# Patient Record
Sex: Male | Born: 1965 | Race: White | Hispanic: No | Smoking: Current every day smoker
Health system: Southern US, Community
[De-identification: ages and names within clinical notes are randomized; demographics above are authoritative.]

## PROBLEM LIST (undated history)

## (undated) DIAGNOSIS — J189 Pneumonia, unspecified organism: Secondary | ICD-10-CM

## (undated) DIAGNOSIS — I739 Peripheral vascular disease, unspecified: Secondary | ICD-10-CM

## (undated) DIAGNOSIS — K5900 Constipation, unspecified: Secondary | ICD-10-CM

## (undated) DIAGNOSIS — D649 Anemia, unspecified: Secondary | ICD-10-CM

## (undated) DIAGNOSIS — C801 Malignant (primary) neoplasm, unspecified: Secondary | ICD-10-CM

## (undated) DIAGNOSIS — F1010 Alcohol abuse, uncomplicated: Secondary | ICD-10-CM

## (undated) DIAGNOSIS — I10 Essential (primary) hypertension: Secondary | ICD-10-CM

## (undated) HISTORY — PX: TONSILLECTOMY: SUR1361

## (undated) HISTORY — PX: FRACTURE SURGERY: SHX138

---

## 1997-12-25 ENCOUNTER — Other Ambulatory Visit: Admission: RE | Admit: 1997-12-25 | Discharge: 1997-12-25 | Payer: Self-pay | Admitting: Urology

## 2000-03-29 ENCOUNTER — Inpatient Hospital Stay (HOSPITAL_COMMUNITY): Admission: EM | Admit: 2000-03-29 | Discharge: 2000-03-30 | Payer: Self-pay | Admitting: Emergency Medicine

## 2002-12-07 ENCOUNTER — Emergency Department (HOSPITAL_COMMUNITY): Admission: AC | Admit: 2002-12-07 | Discharge: 2002-12-07 | Payer: Self-pay

## 2002-12-07 ENCOUNTER — Encounter: Payer: Self-pay | Admitting: Emergency Medicine

## 2005-07-18 ENCOUNTER — Emergency Department (HOSPITAL_COMMUNITY): Admission: EM | Admit: 2005-07-18 | Discharge: 2005-07-18 | Payer: Self-pay | Admitting: Emergency Medicine

## 2008-05-12 ENCOUNTER — Emergency Department (HOSPITAL_COMMUNITY): Admission: EM | Admit: 2008-05-12 | Discharge: 2008-05-12 | Payer: Self-pay | Admitting: Emergency Medicine

## 2010-07-22 ENCOUNTER — Emergency Department (HOSPITAL_COMMUNITY): Admission: EM | Admit: 2010-07-22 | Discharge: 2010-07-22 | Payer: Self-pay | Admitting: Emergency Medicine

## 2010-11-15 LAB — COMPREHENSIVE METABOLIC PANEL WITH GFR
ALT: 158 U/L — ABNORMAL HIGH (ref 0–53)
AST: 139 U/L — ABNORMAL HIGH (ref 0–37)
Calcium: 8.8 mg/dL (ref 8.4–10.5)
Creatinine, Ser: 0.91 mg/dL (ref 0.4–1.5)
GFR calc Af Amer: >60 mL/min (ref >60)
GFR calc non Af Amer: >60 mL/min (ref >60)
Glucose, Bld: 81 mg/dL (ref 70–99)
Sodium: 135 meq/L (ref 135–145)
Total Protein: 7.1 g/dL (ref 6.0–8.3)

## 2010-11-15 LAB — RAPID URINE DRUG SCREEN, HOSP PERFORMED
Amphetamines: NOT DETECTED
Barbiturates: NOT DETECTED
Benzodiazepines: POSITIVE — AB
Cocaine: NOT DETECTED
Opiates: NOT DETECTED
Tetrahydrocannabinol: NOT DETECTED

## 2010-11-15 LAB — DIFFERENTIAL
Basophils Absolute: 0 10*3/uL (ref 0.0–0.1)
Basophils Relative: 0 % (ref 0–1)
Eosinophils Absolute: 0.1 K/uL (ref 0.0–0.7)
Eosinophils Relative: 1 % (ref 0–5)
Lymphocytes Relative: 25 % (ref 12–46)
Lymphs Abs: 2 K/uL (ref 0.7–4.0)
Monocytes Absolute: 0.6 10*3/uL (ref 0.1–1.0)
Monocytes Relative: 8 % (ref 3–12)
Neutro Abs: 5.2 10*3/uL (ref 1.7–7.7)
Neutrophils Relative %: 66 % (ref 43–77)

## 2010-11-15 LAB — COMPREHENSIVE METABOLIC PANEL
Albumin: 4.4 g/dL (ref 3.5–5.2)
Alkaline Phosphatase: 66 U/L (ref 39–117)
BUN: 5 mg/dL — ABNORMAL LOW (ref 6–23)
CO2: 25 mEq/L (ref 19–32)
Chloride: 99 mEq/L (ref 96–112)
Potassium: 4.7 mEq/L (ref 3.5–5.1)
Total Bilirubin: 1.3 mg/dL — ABNORMAL HIGH (ref 0.3–1.2)

## 2010-11-15 LAB — CBC
HCT: 48.6 % (ref 39.0–52.0)
Hemoglobin: 17.3 g/dL — ABNORMAL HIGH (ref 13.0–17.0)
MCH: 35.7 pg — ABNORMAL HIGH (ref 26.0–34.0)
MCHC: 35.6 g/dL (ref 30.0–36.0)
MCV: 100.4 fL — ABNORMAL HIGH (ref 78.0–100.0)
Platelets: 163 K/uL (ref 150–400)
RBC: 4.84 MIL/uL (ref 4.22–5.81)
RDW: 12.8 % (ref 11.5–15.5)
WBC: 7.9 10*3/uL (ref 4.0–10.5)

## 2010-11-15 LAB — ETHANOL: Alcohol, Ethyl (B): 145 mg/dL — ABNORMAL HIGH (ref 0–10)

## 2011-01-20 NOTE — Discharge Summary (Signed)
[place]. [facility]  Patient:    Arthur Mcmahon, Arthur Mcmahon                       MRN: [id number] Adm. Date:  [id number] Disc. Date: [id number] Attending:  Berry, Jonathan [place] Dictator:   Marya Fossa, P.A. CC:         Runell Gess, M.D.             Quitman Livings, M.D., [facility]                           Discharge Summary  DATE OF BIRTH:  Nov 22, 1965  ADMISSION DIAGNOSES: 1. Chest pain, rule out myocardial infarction. 2. Hypertension. 3. Ongoing heavy tobacco abuse. 4. Family history of coronary artery disease.  DISCHARGE DIAGNOSES: 1. Chest pain--noncardiac, normal coronaries per catheterization. 2. Tobacco abuse--counseled on cessation.  HISTORY OF PRESENT ILLNESS:  Mr. Arthur Mcmahon is a very pleasant 45 year old married white male, new patient to Dr. Nanetta Batty, referred by Dr. Rosalia Hammers at [facility] ER for evaluation of chest pain.  He has multiple cardiac risk factors including hypertension, tobacco abuse, family history, and unknown lipid status.  On his way to work he felt weak and short of breath.  When he got to work he walked up two flights of stairs and became increasing short of breath, fatigued with his heart racing and chest pressure.  It lasted approximately 10-15 minutes.  He had some nausea but no radiation symptoms.  He then went home and still felt poorly.  His wife brought him to the emergency room in [place].  He was given aspirin and IV heparin.  He had no chest pressure at [facility] and was transferred to [facility] for further evaluation.  ECG is unremarkable.  The patient has had a few episodes of weakness and shortness of breath over the last couple of months when his blood pressure was elevated.  He denies MI or coronary artery disease.  The patient will be admitted for chest pain, rule out MI with a planned a cardiac catheterization.  PROCEDURE:  Cardiac catheterization March 29, 2000, by Dr. Nanetta Batty.  COMPLICATIONS:   None.  CONSULTATIONS:  None.  HOSPITAL COURSE:  The patient was continued on IV heparin per pharmacy protocol.  Cardiac enzymes were negative.  Laboratory studies were unremarkable.  Chest x-ray unremarkable.  The patient was taken to the cardiac catheterization by Dr. Nanetta Batty, March 29, 2000.  This revealed normal coronary arteries and normal LV function. Dr. Allyson Sabal felt that he probably had anxiety or reflux symptoms but noncardiac chest pain.  The patient remained stable postcatheterization and was deemed stable for discharge to home by Dr. Allyson Sabal on March 30, 2000.  He will be seen back in our [place] office on August 10 at 10:40 by Lezlie Octave.  He should followup then with his primary care Shiah Berhow for smoking cessation and continued blood pressure control.  DISCHARGE MEDICATIONS: 1. Micardis as before. 2. Wellbutrin SR 150 mg one a day for 5 days, then b.i.d. 3. Protonix 40 mg a day.  Perclose was used during the procedure and the patient has been instructed to do no swimming or soaking in the tub for a week.  He may shower but to keep the groin dry.  He is to refrain from heavy lifting, sexual activity, driving for the next three days and may return to work without restriction on Monday.  DD:  03/30/00 TD:  04/02/00 Job: [id number]

## 2011-01-20 NOTE — Cardiovascular Report (Signed)
[place]. [facility]  Patient:    Arthur Mcmahon, Arthur Mcmahon                         MRN: [id number] Attending:  Runell Gess, M.D. CC:         Cardiac Catheterization Lab             [facility], 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  His cardiac risk factors include hypertension and tobacco use as well as a family history of heart disease.  He has had several episodes of vague chest pressure this week culminating in a more significant episode today associated with nausea and some shortness of breath.  His EKGs showed no acute changes.  His enzymes were negative.  He was seen in the [facility] ER by Dr. Hipolito Bayley and transferred to [facility] by [facility] where he was pain-free on arrival after receiving aspirin and IV heparin.  He presents now for diagnostic coronary arteriography.  DESCRIPTION OF PROCEDURE:  The patient was brought to the second floor [facility] Cardiac Catheterization Lab in the postabsorptive state.  He was premedicated with p.o. Valium.  His right groin was prepped and shaved in the usual sterile fashion.  Xylocaine, 1%, was used for local anesthesia.  A 6-French sheath was inserted into the right femoral artery using standard Seldinger technique.  The 6-French right and left Judkins diagnostic catheters, along with a 6-French pigtail catheter, were used for selective coronary angiography, left ventriculography, and distal abdominal aortography. Omnipaque dye was used for the entirety of the case. Retrograde aortic, left ventricular, and pullback pressures were recorded.  HEMODYNAMICS: 1. Aortic systolic pressure 112. 2. Diastolic pressure 70. 3. Left ventricular systolic pressure 113. 4. End diastolic pressure 13.  SELECTIVE CORONARY ANGIOGRAPHY: 1. Left main:  Normal.  At the  end of the left shots in the LAO cranial view,    the left main did appear to be stenosed; however, this was not visible in    any other view, and I suspect that this was catheter spasm-induced. 2. LAD:  Widely patent. 3. Ramus intermedius branch:  Widely patent. 4. Left circumflex:  Widely patent. 5. Right coronary artery:  This was a dominant vessel and was widely patent.  LEFT VENTRICULOGRAPHY:  RAO left ventriculogram was performed using 25 cc of Omnipaque dye at 12 cc per second.  The overall LVEF was estimated greater than 60% without focal wall motion abnormalities.  DISTAL ABDOMINAL AORTOGRAPHY:  Distal abdominal aortogram was performed using 30 cc of Omnipaque dye at 20 cc per second.  The renal arteries were widely patent.  The infrarenal abdominal aorta and the iliac bifurcation appear free of significant atherosclerotic changes.  IMPRESSION:  Arthur Mcmahon has essentially normal coronary arteries, normal LV function, and normal renal arteries, suggesting his hypertension is essential in nature.  His right femoral arterial puncture site was Perclosed, and hemostasis was obtained.  The patient left the lab in stable condition.  He will be observed overnight and discharged home in the morning.  Dr. Hipolito Bayley was notified of these results.  The patient left the lab in stable condition. DD:  03/29/00 TD:  03/30/00 Job: [id number]  [id number]

## 2011-12-25 ENCOUNTER — Emergency Department (HOSPITAL_COMMUNITY)
Admission: EM | Admit: 2011-12-25 | Discharge: 2011-12-25 | Disposition: A | Discharge status: Home / Self Care | Payer: Self-pay | Attending: Emergency Medicine | Admitting: Emergency Medicine

## 2011-12-25 ENCOUNTER — Encounter (HOSPITAL_COMMUNITY): Payer: Self-pay

## 2011-12-25 DIAGNOSIS — K029 Dental caries, unspecified: Secondary | ICD-10-CM | POA: Insufficient documentation

## 2011-12-25 DIAGNOSIS — I10 Essential (primary) hypertension: Secondary | ICD-10-CM | POA: Insufficient documentation

## 2011-12-25 DIAGNOSIS — K0889 Other specified disorders of teeth and supporting structures: Secondary | ICD-10-CM

## 2011-12-25 DIAGNOSIS — Z8249 Family history of ischemic heart disease and other diseases of the circulatory system: Secondary | ICD-10-CM | POA: Insufficient documentation

## 2011-12-25 DIAGNOSIS — F17200 Nicotine dependence, unspecified, uncomplicated: Secondary | ICD-10-CM | POA: Insufficient documentation

## 2011-12-25 DIAGNOSIS — Z809 Family history of malignant neoplasm, unspecified: Secondary | ICD-10-CM | POA: Insufficient documentation

## 2011-12-25 HISTORY — DX: Essential (primary) hypertension: I10

## 2011-12-25 MED ORDER — PENICILLIN V POTASSIUM 500 MG PO TABS
500.0000 mg | ORAL_TABLET | Freq: Once | ORAL | Status: AC
  Administered 2011-12-25: 500 mg via ORAL
  Filled 2011-12-25: qty 1

## 2011-12-25 MED ORDER — HYDROCODONE-ACETAMINOPHEN 5-325 MG PO TABS: 1.0000 | ORAL_TABLET | ORAL | Status: AC | PRN

## 2011-12-25 MED ORDER — PENICILLIN V POTASSIUM 500 MG PO TABS: 500.0000 mg | ORAL_TABLET | Freq: Three times a day (TID) | ORAL | Status: AC

## 2011-12-25 NOTE — ED Provider Notes (Signed)
History     CSN: [id number]  Arrival date & time 12/25/11  1315   First MD Initiated Contact with Patient 12/25/11 1523      Chief Complaint  Patient presents with  . Dental Pain    (Consider location/radiation/quality/duration/timing/severity/associated sxs/prior treatment) Patient is a 46 y.o. male presenting with tooth pain. The history is provided by the patient.  Dental PainThe primary symptoms include mouth pain and dental injury. Primary symptoms do not include fever or sore throat. The symptoms occur constantly.  Additional symptoms include: facial swelling. Associated symptoms comments: He broke a lower tooth several weeks ago and is now having progressively worsening pain. Last night was worst pain and also onset of facial swelling. No fever. .    Past Medical History  Diagnosis Date  . Hypertension     Past Surgical History  Procedure Date  . Tonsillectomy     Family History  Problem Relation Age of Onset  . Cancer Mother   . Heart failure Mother   . Cancer Father     History  Substance Use Topics  . Smoking status: Current Everyday Smoker -- 1.0 packs/day for 25 years    Types: Cigarettes  . Smokeless tobacco: Never Used  . Alcohol Use: Yes     occasionally      Review of Systems  Constitutional: Negative.  Negative for fever.  HENT: Positive for facial swelling and dental problem. Negative for sore throat and mouth sores.   Gastrointestinal: Negative.  Negative for nausea.    Allergies  Review of patient's allergies indicates no known allergies.  Home Medications   Current Outpatient Rx  Name Route Sig Dispense Refill  . ACETAMINOPHEN 325 MG PO TABS Oral Take 650 mg by mouth every 6 (six) hours as needed. Pain.    Marland Kitchen PRESCRIPTION MEDICATION Oral Take 1 tablet by mouth daily.      BP 147/87  Pulse 78  Temp(Src) 98.2 F (36.8 C) (Oral)  Resp 18  Ht 6\' 2"  (1.88 m)  Wt 230 lb (104.327 kg)  BMI 29.53 kg/m2  SpO2 98%  Physical Exam    Constitutional: He appears well-developed and well-nourished.  HENT:  Head: Normocephalic.  Mouth/Throat: Oropharynx is clear and moist.       Severe decay to #27, 28 without redness and swelling to alveolar ridge surfaces. No pointing abscess.   Lymphadenopathy:    He has no cervical adenopathy.  Neurological: He is alert.    ED Course  Procedures (including critical care time)  Labs Reviewed - No data to display No results found.   No diagnosis found. 1. Dental caries 2. Dental pain   MDM  Will cover for infection given increased pain in the presence of severe decay. Patient report pain usually controlled with Alleve but is becoming less effective. Will give pain relief and encourage dental follow up.       Rodena Medin, PA-C 12/25/11 1556

## 2011-12-25 NOTE — Discharge Instructions (Signed)
FOLLOW UP WITH A DENTIST OF YOUR CHOICE FOR FURTHER MANAGEMENT OF DENTAL DECAY. TAKE MEDICATIONS AS PRESCRIBED, TAKING NORCO AT NIGHT WHEN NOT WORKING OR DRIVING AS IT CAUSES DROWSINESS. RETURN HERE AS NEEDED.  Dental Caries  Tooth decay (dental caries, cavities) is the most common of all oral diseases. It occurs in all ages but is more common in children and young adults.  CAUSES  Bacteria in your mouth combine with foods (particularly sugars and starches) to produce plaque. Plaque is a substance that sticks to the hard surfaces of teeth. The bacteria in the plaque produce acids that attack the enamel of teeth. Repeated acid attacks dissolve the enamel and create holes in the teeth. Root surfaces of teeth may also get these holes.  Other contributing factors include:   Frequent snacking and drinking of cavity-producing foods and liquids.   Poor oral hygiene.   Dry mouth.   Substance abuse such as methamphetamine.   Broken or poor fitting dental restorations.   Eating disorders.   Gastroesophageal reflux disease (GERD).   Certain radiation treatments to the head and neck.  SYMPTOMS  At first, dental decay appears as white, chalky areas on the enamel. In this early stage, symptoms are seldom present. As the decay progresses, pits and holes may appear on the enamel surfaces. Progression of the decay will lead to softening of the hard layers of the tooth. At this point you may experience some pain or achy feeling after sweet, hot, or cold foods or drinks are consumed. If left untreated, the decay will reach the internal structures of the tooth and produce severe pain. Extensive dental treatment, such as root canal therapy, may be needed to save the tooth at this late stage of decay development.  DIAGNOSIS  Most cavities will be detected during regular check-ups. A thorough medical and dental history will be taken by the dentist. The dentist will use instruments to check the surfaces of your  teeth for any breakdown or discoloration. Some dentists have special instruments, such as lasers, that detect tooth decay. Dental X-rays may also show some cavities that are not visible to the eye (such as between the contact areas of the teeth). TREATMENT  Treatment involves removal of the tooth decay and replacement with a restorative material such as silver, gold, or composite (white) material. However, if the decay involves a large area of the tooth and there is little remaining healthy tooth structure, a cap (crown) will be fitted over the remaining structure. If the decay involves the center part of the tooth (pulp), root canal treatment will be needed before any type of dental restoration is placed. If the tooth is severely destroyed by the decay process, leaving the remaining tooth structures unrestorable, the tooth will need to be pulled (extracted). Some early tooth decay may be reversed by fluoride treatments and thorough brushing and flossing at home. PREVENTION   Eat healthy foods. Restrict the amount of sugary, starchy foods and liquids you consume. Avoid frequent snacking and drinking of unhealthy foods and liquids.   Sealants can help with prevention of cavities. Sealants are composite resins applied onto the biting surfaces of teeth at risk for decay. They smooth out the pits and grooves and prevent food from being trapped in them. This is done in early childhood before tooth decay has started.   Fluoride tablets may also be prescribed to children between 6 months and 74 years of age if your drinking water is not fluoridated. The fluoride absorbed by  the tooth enamel makes teeth less susceptible to decay. Thorough daily cleaning with a toothbrush and dental floss is the best way to prevent cavities. Use of a fluoride toothpaste is highly recommended. Fluoride mouth rinses may be used in specific cases.   Topical application of fluoride by your dentist is important in children.    Regular visits with a dentist for checkups and cleanings are also important.  SEEK IMMEDIATE DENTAL CARE IF:  You have a fever.   You develop redness and swelling of your face, jaw, or neck.   You develop swelling around a tooth.   You are unable to open your mouth or cannot swallow.   You have severe pain uncontrolled by pain medicine.  Document Released: 05/13/2002 Document Revised: 08/10/2011 Document Reviewed: 01/26/2011 [facility] Patient Information 2012 [place], [place].

## 2011-12-25 NOTE — ED Notes (Signed)
Patient reports that he began having throbbing and swelling of right face. Patient reports that he broke a tooth 3-4 weeks ago and now has a Right lower tooth abscess.

## 2011-12-27 NOTE — ED Provider Notes (Signed)
History/physical exam/procedure(s) were performed by non-physician practitioner and as supervising physician I was immediately available for consultation/collaboration. I have reviewed all notes and am in agreement with care and plan.   Hilario Quarry, MD 12/27/11 [phone]

## 2013-02-05 ENCOUNTER — Encounter (HOSPITAL_COMMUNITY): Payer: Self-pay | Admitting: Emergency Medicine

## 2013-02-05 ENCOUNTER — Emergency Department (HOSPITAL_COMMUNITY)
Admission: EM | Admit: 2013-02-05 | Discharge: 2013-02-06 | Disposition: A | Discharge status: Home / Self Care | Payer: Self-pay | Attending: Emergency Medicine | Admitting: Emergency Medicine

## 2013-02-05 DIAGNOSIS — Z79899 Other long term (current) drug therapy: Secondary | ICD-10-CM | POA: Insufficient documentation

## 2013-02-05 DIAGNOSIS — F1010 Alcohol abuse, uncomplicated: Secondary | ICD-10-CM | POA: Insufficient documentation

## 2013-02-05 DIAGNOSIS — I10 Essential (primary) hypertension: Secondary | ICD-10-CM | POA: Insufficient documentation

## 2013-02-05 LAB — COMPREHENSIVE METABOLIC PANEL
ALT: 122 U/L — ABNORMAL HIGH (ref 0–53)
AST: 173 U/L — ABNORMAL HIGH (ref 0–37)
Albumin: 4.4 g/dL (ref 3.5–5.2)
Alkaline Phosphatase: 84 U/L (ref 39–117)
Potassium: 3.5 mEq/L (ref 3.5–5.1)
Sodium: 138 mEq/L (ref 135–145)
Total Protein: 7.8 g/dL (ref 6.0–8.3)

## 2013-02-05 LAB — RAPID URINE DRUG SCREEN, HOSP PERFORMED
Amphetamines: NOT DETECTED
Cocaine: NOT DETECTED
Opiates: NOT DETECTED
Tetrahydrocannabinol: POSITIVE — AB

## 2013-02-05 LAB — CBC
Hemoglobin: 17.8 g/dL — ABNORMAL HIGH (ref 13.0–17.0)
MCHC: 36.8 g/dL — ABNORMAL HIGH (ref 30.0–36.0)
RDW: 13.2 % (ref 11.5–15.5)

## 2013-02-05 MED ORDER — LORAZEPAM 1 MG PO TABS
1.0000 mg | ORAL_TABLET | Freq: Three times a day (TID) | ORAL | Status: DC | PRN
  Administered 2013-02-05: 1 mg via ORAL
  Filled 2013-02-05: qty 1

## 2013-02-05 MED ORDER — NICOTINE 21 MG/24HR TD PT24
21.0000 mg | MEDICATED_PATCH | Freq: Every day | TRANSDERMAL | Status: DC
  Administered 2013-02-05: 21 mg via TRANSDERMAL
  Filled 2013-02-05: qty 1

## 2013-02-05 MED ORDER — IBUPROFEN 400 MG PO TABS: 600.0000 mg | ORAL_TABLET | Freq: Three times a day (TID) | ORAL | Status: DC | PRN

## 2013-02-05 MED ORDER — ONDANSETRON HCL 4 MG PO TABS: 4.0000 mg | ORAL_TABLET | Freq: Three times a day (TID) | ORAL | Status: DC | PRN

## 2013-02-05 MED ORDER — ZOLPIDEM TARTRATE 5 MG PO TABS
5.0000 mg | ORAL_TABLET | Freq: Every evening | ORAL | Status: DC | PRN
  Administered 2013-02-06: 5 mg via ORAL
  Filled 2013-02-05: qty 1

## 2013-02-05 MED ORDER — ALUM & MAG HYDROXIDE-SIMETH 200-200-20 MG/5ML PO SUSP: 30.0000 mL | ORAL | Status: DC | PRN

## 2013-02-05 MED ORDER — ACETAMINOPHEN 325 MG PO TABS: 650.0000 mg | ORAL_TABLET | ORAL | Status: DC | PRN

## 2013-02-05 NOTE — ED Notes (Signed)
Patient requested and received a ginger ale.

## 2013-02-05 NOTE — ED Notes (Signed)
Pt wanded by security. Wallet given to security and locked

## 2013-02-05 NOTE — ED Notes (Signed)
Pt requesting detox from ETOH.  Admits to one fifth everyday.  St's last drank earlier today. Pt denies SI or HI

## 2013-02-05 NOTE — ED Notes (Signed)
ACT team at bedside.  

## 2013-02-06 MED ORDER — LORAZEPAM 2 MG/ML IJ SOLN: 1.0000 mg | Freq: Four times a day (QID) | INTRAMUSCULAR | Status: DC | PRN

## 2013-02-06 MED ORDER — THIAMINE HCL 100 MG/ML IJ SOLN: 100.0000 mg | Freq: Every day | INTRAMUSCULAR | Status: DC

## 2013-02-06 MED ORDER — LORAZEPAM 1 MG PO TABS: 1.0000 mg | ORAL_TABLET | Freq: Four times a day (QID) | ORAL | Status: DC | PRN

## 2013-02-06 MED ORDER — LORAZEPAM 1 MG PO TABS: 0.0000 mg | ORAL_TABLET | Freq: Two times a day (BID) | ORAL | Status: DC

## 2013-02-06 MED ORDER — ADULT MULTIVITAMIN W/MINERALS CH: 1.0000 | ORAL_TABLET | Freq: Every day | ORAL | Status: DC

## 2013-02-06 MED ORDER — FOLIC ACID 1 MG PO TABS: 1.0000 mg | ORAL_TABLET | Freq: Every day | ORAL | Status: DC

## 2013-02-06 MED ORDER — LORAZEPAM 1 MG PO TABS: 0.0000 mg | ORAL_TABLET | Freq: Four times a day (QID) | ORAL | Status: DC

## 2013-02-06 MED ORDER — VITAMIN B-1 100 MG PO TABS: 100.0000 mg | ORAL_TABLET | Freq: Every day | ORAL | Status: DC

## 2013-02-06 NOTE — ED Provider Notes (Signed)
Medical screening examination/treatment/procedure(s) were conducted as a shared visit with non-physician practitioner(s) and myself.  I personally evaluated the patient during the encounter  47yo M, long hx of etoh abuse, worse since Feb when he lost his job. States he has been drinking approx 1/5th liquor daily, unable to stop on his own due to "not feeling good" when he didn't drink etoh.  Presents to ED requesting etoh detox. Denies SI, no HI. Feels "a little shakey and restless now," will place on CIWA protocol. VSS, A&O, resps easy, RRR, neuro non-focal, calm/cooperative. Labs and UDS obtained; ACT to see.  Laray Anger, DO 02/06/13 [phone]

## 2013-02-06 NOTE — BH Assessment (Signed)
[facility] Assessment Progress Note      Update:  Spoke with Eber Jones at [facility] @ [phone] and she reported pt arrived to [facility].

## 2013-02-06 NOTE — ED Notes (Signed)
Per Berna Spare (with ACT), pt has been accepted at [facility] in [place], [place]. Patient will be discharged in the AM (sometime after breakfast). Patient will arrange his own transportation to the facility. Nursing is to phone report to facility at [phone] once patient is discharged and leaves. Patient must report to [facility] within 1 hour of leaving [facility].

## 2013-02-06 NOTE — ED Notes (Signed)
Patients belongings returned. Pt out to nurse's station working on getting a ride.

## 2013-02-06 NOTE — BH Assessment (Signed)
Assessment Note   Arthur Mcmahon is an 47 y.o. male.  Pt came to [facility] seeking detox from ETOH.  He is currently drinking a fifth of whiskey daily and has done so at that rate for the last 3 months.  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  Patient last drank around noon on 06/04 and had drank a pint at that time.  Patient smokes marijuana 1-2 times per month.  Patient denies any SI, HI or A/V hallucinations.  He reports depression over his current addiction, job loss and he has also had 4 family members die in the last 2 years.  Patient was at [facility] for detox three years ago.  Patient is being referred to [facility] for detox since they have some beds available.  Axis I: 303.90 ETOH dependence Axis II: Deferred Axis III:  Past Medical History  Diagnosis Date  . Hypertension    Axis IV: economic problems, occupational problems and problems with primary support group Axis V: 31-40 impairment in reality testing  Past Medical History:  Past Medical History  Diagnosis Date  . Hypertension     Past Surgical History  Procedure Laterality Date  . Tonsillectomy    . Fracture surgery      Family History:  Family History  Problem Relation Age of Onset  . Cancer Mother   . Heart failure Mother   . Cancer Father     Social History:  reports that he has been smoking Cigarettes.  He has a 25 pack-year smoking history. He has never used smokeless tobacco. He reports that  drinks alcohol. He reports that he uses illicit drugs (Marijuana).  Additional Social History:  Alcohol / Drug Use Pain Medications: N/A Prescriptions: None Over the Counter: N/A History of alcohol / drug use?: Yes Withdrawal Symptoms: Agitation;Cramps;Fever / Chills;Nausea / Vomiting;Tremors;Patient aware of relationship between substance abuse and physical/medical complications Substance #1 Name of Substance 1: ETOH 1 - Age of First Use: Teens 1 - Amount (size/oz): 1/5 per  day 1 - Frequency: Daily use 1 - Duration: 3 months 1 - Last Use / Amount: 06/04 around noon, drank about a pint Substance #2 Name of Substance 2: Marijuana 2 - Age of First Use: Teens 2 - Amount (size/oz): Varies, does not seek it out. 2 - Frequency: 1-2 x/M 2 - Duration: On-going 2 - Last Use / Amount: Cannot recall last use  CIWA: CIWA-Ar BP: 126/71 mmHg Pulse Rate: 88 Nausea and Vomiting: no nausea and no vomiting Tactile Disturbances: none Tremor: two Auditory Disturbances: not present Paroxysmal Sweats: no sweat visible Visual Disturbances: very mild sensitivity Anxiety: mildly anxious Headache, Fullness in Head: none present Agitation: normal activity Orientation and Clouding of Sensorium: cannot do serial additions or is uncertain about date CIWA-Ar Total: 5 COWS:    Allergies: No Known Allergies  Home Medications:  (Not in a hospital admission)  OB/GYN Status:  No LMP for male patient.  General Assessment Data Location of Assessment: [facility] ED Living Arrangements: Alone Can pt return to current living arrangement?: Yes Admission Status: Voluntary Is patient capable of signing voluntary admission?: Yes Transfer from: Acute Hospital Referral Source: Self/Family/Friend     Risk to self Suicidal Ideation: No Suicidal Intent: No Is patient at risk for suicide?: No Suicidal Plan?: No Access to Means: No What has been your use of drugs/alcohol within the last 12 months?: ETOH daily use Previous Attempts/Gestures: No How many times?: 0 Other  Self Harm Risks: SA issues Triggers for Past Attempts: None known Intentional Self Injurious Behavior: None Family Suicide History: No Recent stressful life event(s): Loss (Comment);Financial Problems;Job Loss (Lost 4 family members over last 2-3 years; job lost in March) Persecutory voices/beliefs?: No Depression: Yes Depression Symptoms: Despondent;Insomnia;Isolating;Loss of interest in usual pleasures;Feeling  worthless/self pity Substance abuse history and/or treatment for substance abuse?: Yes Suicide prevention information given to non-admitted patients: Not applicable  Risk to Others Homicidal Ideation: No Thoughts of Harm to Others: No Current Homicidal Intent: No Current Homicidal Plan: No Access to Homicidal Means: No Identified Victim: No one History of harm to others?: No Assessment of Violence: In distant past Violent Behavior Description: Pt calm and cooperative Does patient have access to weapons?: No Criminal Charges Pending?: No Does patient have a court date: No  Psychosis Hallucinations: None noted Delusions: None noted  Mental Status Report Appear/Hygiene: Disheveled Eye Contact: Good Motor Activity: Freedom of movement;Unremarkable Speech: Logical/coherent Level of Consciousness: Quiet/awake Mood: Depressed;Guilty Affect: Sad Anxiety Level: Minimal Thought Processes: Coherent;Relevant Judgement: Unimpaired Orientation: Person;Place;Situation Obsessive Compulsive Thoughts/Behaviors: Minimal  Cognitive Functioning Concentration: Decreased Memory: Recent Impaired;Remote Intact IQ: Average Insight: Fair Impulse Control: Poor Appetite: Poor Weight Loss: 15 Weight Gain: 0 Sleep: Decreased Total Hours of Sleep:  (<6H/D.  Will drink until he goes to sleep) Vegetative Symptoms: None  ADLScreening [facility] Assessment Services) Patient's cognitive ability adequate to safely complete daily activities?: Yes Patient able to express need for assistance with ADLs?: Yes Independently performs ADLs?: Yes (appropriate for developmental age)  Abuse/Neglect [facility]) Physical Abuse: Denies Verbal Abuse: Denies Sexual Abuse: Denies  Prior Inpatient Therapy Prior Inpatient Therapy: Yes Prior Therapy Dates: 3 years ago Prior Therapy Facilty/Provider(s): [facility] Reason for Treatment: Detox  Prior Outpatient Therapy Prior Outpatient Therapy: No Prior Therapy Dates:  None Prior Therapy Facilty/Provider(s): N/A Reason for Treatment: N/A  ADL Screening (condition at time of admission) Patient's cognitive ability adequate to safely complete daily activities?: Yes Patient able to express need for assistance with ADLs?: Yes Independently performs ADLs?: Yes (appropriate for developmental age) Weakness of Legs: None Weakness of Arms/Hands: None  Home Assistive Devices/Equipment Home Assistive Devices/Equipment: None    Abuse/Neglect Assessment (Assessment to be complete while patient is alone) Physical Abuse: Denies Verbal Abuse: Denies Sexual Abuse: Denies Exploitation of patient/patient's resources: Denies Self-Neglect: Denies     Merchant navy officer (For Healthcare) Advance Directive: Patient does not have advance directive;Patient would not like information    Additional Information 1:1 In Past 12 Months?: No CIRT Risk: No Elopement Risk: No Does patient have medical clearance?: Yes     Disposition:  Disposition Initial Assessment Completed for this Encounter: Yes Disposition of Patient: Inpatient treatment program;Referred to Type of inpatient treatment program: Adult Patient referred to: [facility]  On Site Evaluation by:   Reviewed with Physician:  Junious Silk, PA     Beatriz Stallion Ray 02/06/2013 1:59 AM

## 2013-02-06 NOTE — ED Notes (Signed)
Patient sleeping and resting comfortably.

## 2013-02-06 NOTE — BH Assessment (Signed)
[facility] Assessment Progress Note   Patient has been accepted to [facility] in [place].  Patient was accepted by Cory Munch there.  This clinician procured authorization from [employer] and sent that authorization number to [facility].  Patient has said that he can get transportation possibly from brother.  Patient is unsure of when he can be picked up once he makes those arrangements.  When patient does leave the staff need to know at [facility] b/c patient won't be admitted if it takes him more than an hour to get there.  Nursing staff will need to call [facility] at [phone] when patient leaves.  If patient cannot make transportation arrangements then social work should be contacted.

## 2013-02-06 NOTE — ED Notes (Signed)
Pt belongings inventory done and placed in the locker.

## 2013-02-06 NOTE — ED Notes (Signed)
[facility] contacted and informed of pt's discharged.

## 2013-02-06 NOTE — ED Notes (Signed)
Discussed discharge plan with patient. Patient states that his brother will be able to transport him to [facility]. Patient will notify staff if he is unable to arrange transportation within a reasonable time frame this AM. Informed patient that he must arrive to [facility] within 1 hour of leaving [facility] or he will not be admitted to the facility. Patient agreeable with plan of care and verbalizes understanding.

## 2013-02-06 NOTE — ED Notes (Signed)
Pt's brother contacted, on the way to get pt. Pt given diet coke, no further needs.

## 2013-02-06 NOTE — ED Provider Notes (Signed)
Pt accepted at [facility] for detox program. 0705  Hurman Horn, MD 02/06/13 [phone]

## 2013-08-05 ENCOUNTER — Emergency Department (HOSPITAL_COMMUNITY)
Admission: EM | Admit: 2013-08-05 | Discharge: 2013-08-06 | Disposition: A | Discharge status: Home / Self Care | Payer: Self-pay | Attending: Emergency Medicine | Admitting: Emergency Medicine

## 2013-08-05 ENCOUNTER — Encounter (HOSPITAL_COMMUNITY): Payer: Self-pay | Admitting: Emergency Medicine

## 2013-08-05 DIAGNOSIS — F17200 Nicotine dependence, unspecified, uncomplicated: Secondary | ICD-10-CM | POA: Insufficient documentation

## 2013-08-05 DIAGNOSIS — F10229 Alcohol dependence with intoxication, unspecified: Secondary | ICD-10-CM | POA: Insufficient documentation

## 2013-08-05 DIAGNOSIS — F1020 Alcohol dependence, uncomplicated: Secondary | ICD-10-CM

## 2013-08-05 DIAGNOSIS — R110 Nausea: Secondary | ICD-10-CM | POA: Insufficient documentation

## 2013-08-05 DIAGNOSIS — I10 Essential (primary) hypertension: Secondary | ICD-10-CM | POA: Insufficient documentation

## 2013-08-05 LAB — CBC
MCH: 36.3 pg — ABNORMAL HIGH (ref 26.0–34.0)
MCHC: 36.4 g/dL — ABNORMAL HIGH (ref 30.0–36.0)
MCV: 99.8 fL (ref 78.0–100.0)
Platelets: 201 10*3/uL (ref 150–400)

## 2013-08-05 MED ORDER — LORAZEPAM 1 MG PO TABS
0.0000 mg | ORAL_TABLET | Freq: Four times a day (QID) | ORAL | Status: DC
  Administered 2013-08-06: 1 mg via ORAL
  Filled 2013-08-05: qty 1

## 2013-08-05 MED ORDER — LORAZEPAM 1 MG PO TABS: 0.0000 mg | ORAL_TABLET | Freq: Two times a day (BID) | ORAL | Status: DC

## 2013-08-05 NOTE — ED Provider Notes (Signed)
CSN: [id number]     Arrival date & time 08/05/13  2034 History   First MD Initiated Contact with Patient 08/05/13 2306     Chief Complaint  Patient presents with  . Medical Clearance   (Consider location/radiation/quality/duration/timing/severity/associated sxs/prior Treatment) HPI Patient presents for detox from alcohol. States he drinks roughly a fifth of whiskey a day. He last drank earlier this morning. He is now having some fine tremor and nausea. He states this is typical when he stopped streaking alcohol. He has been in rehabilitation before roughly 9 months ago. The side tremor patient denies any other symptoms. He denies any suicidal ideation homicidal ideation. Denies any visual or auditory hallucinations. Past Medical History  Diagnosis Date  . Hypertension    Past Surgical History  Procedure Laterality Date  . Tonsillectomy    . Fracture surgery     Family History  Problem Relation Age of Onset  . Cancer Mother   . Heart failure Mother   . Cancer Father    History  Substance Use Topics  . Smoking status: Current Every Day Smoker -- 1.00 packs/day for 25 years    Types: Cigarettes  . Smokeless tobacco: Never Used  . Alcohol Use: Yes     Comment: everyday    Review of Systems  Constitutional: Negative for fever and chills.  Respiratory: Negative for shortness of breath.   Cardiovascular: Negative for chest pain.  Gastrointestinal: Negative for nausea, vomiting, abdominal pain and diarrhea.  Genitourinary: Negative for dysuria.  Musculoskeletal: Negative for back pain, myalgias, neck pain and neck stiffness.  Skin: Negative for rash and wound.  Neurological: Positive for tremors. Negative for dizziness, seizures, syncope, weakness, numbness and headaches.  Psychiatric/Behavioral: Negative for suicidal ideas, hallucinations, confusion, self-injury and agitation. The patient is not nervous/anxious.   All other systems reviewed and are negative.    Allergies   Review of patient's allergies indicates no known allergies.  Home Medications  No current outpatient prescriptions on file. BP 140/84  Pulse 96  Temp(Src) 97.6 F (36.4 C) (Oral)  Resp 18  Ht 6\' 2"  (1.88 m)  Wt 211 lb 5 oz (95.851 kg)  BMI 27.12 kg/m2  SpO2 94% Physical Exam  Nursing note and vitals reviewed. Constitutional: He is oriented to person, place, and time. He appears well-developed and well-nourished. No distress.  HENT:  Head: Normocephalic and atraumatic.  Mouth/Throat: Oropharynx is clear and moist.  Eyes: EOM are normal. Pupils are equal, round, and reactive to light.  Neck: Normal range of motion. Neck supple.  Cardiovascular: Normal rate and regular rhythm.   Pulmonary/Chest: Effort normal and breath sounds normal. No respiratory distress. He has no wheezes. He has no rales.  Abdominal: Soft. Bowel sounds are normal. He exhibits no distension and no mass. There is no tenderness. There is no rebound and no guarding.  Musculoskeletal: Normal range of motion. He exhibits no edema and no tenderness.  Neurological: He is alert and oriented to person, place, and time.  Fine tremor. Extremity is without deficit. Sensation grossly intact.  Skin: Skin is warm and dry. No rash noted. No erythema.  Psychiatric: He has a normal mood and affect. His behavior is normal.  No SI/HI. Goal-oriented speech.    ED Course  Procedures (including critical care time) Labs Review Labs Reviewed  CBC  COMPREHENSIVE METABOLIC PANEL  ETHANOL  URINE RAPID DRUG SCREEN (HOSP PERFORMED)   Imaging Review No results found.  EKG Interpretation   None  MDM  Place on CIWA protocol and psychiatric team attempts to place in rehabilitation    Loren Racer, MD 08/06/13 [phone]

## 2013-08-05 NOTE — ED Notes (Signed)
Pt told of need for urine sample.

## 2013-08-05 NOTE — ED Notes (Signed)
Pt st's he needs detox from alcohol.  St's when he stops drinking he becomes nauseated and starts getting the shakes.  pts st's he last drank earlier today

## 2013-08-06 LAB — COMPREHENSIVE METABOLIC PANEL
ALT: 171 U/L — ABNORMAL HIGH (ref 0–53)
AST: 176 U/L — ABNORMAL HIGH (ref 0–37)
CO2: 27 mEq/L (ref 19–32)
Calcium: 9.4 mg/dL (ref 8.4–10.5)
Creatinine, Ser: 0.82 mg/dL (ref 0.50–1.35)
GFR calc Af Amer: >90 mL/min (ref >90)
GFR calc non Af Amer: >90 mL/min (ref >90)
Sodium: 141 mEq/L (ref 135–145)
Total Protein: 7.4 g/dL (ref 6.0–8.3)

## 2013-08-06 LAB — RAPID URINE DRUG SCREEN, HOSP PERFORMED
Barbiturates: NOT DETECTED
Benzodiazepines: NOT DETECTED
Cocaine: NOT DETECTED
Opiates: NOT DETECTED
Tetrahydrocannabinol: POSITIVE — AB

## 2013-08-06 MED ORDER — NICOTINE 21 MG/24HR TD PT24
21.0000 mg | MEDICATED_PATCH | Freq: Once | TRANSDERMAL | Status: DC
  Administered 2013-08-06: 21 mg via TRANSDERMAL
  Filled 2013-08-06: qty 1

## 2013-08-06 NOTE — BH Assessment (Signed)
Pt has been accepted to [facility], per [facility]. Pt is asked to be ready for pick-up between 1400 and 1430. [facility] will call when the driver is in route.  -Dossie Arbour, MA  Disposition [facility]

## 2013-08-06 NOTE — BH Assessment (Addendum)
[facility]HT spoke with Arthur Mcmahon at [facility] and was asked to re-fax the referral for pt. Referral was re-faxed.  Dossie Arbour, MA  Disposition [facility]

## 2013-08-06 NOTE — BH Assessment (Signed)
Assessment Note  Arthur Mcmahon is a 47 y.o. male presenting to [facility] for alcohol detox. Pt denies SI/HI/AVH.  Pt drinks 1/5 bourbon and 3-4 240oz beers, daily, last drink was 08/05/13.  Pt drank 3/4 of 1/5 of bourbon.  Pt denies other SA use, however UDS is + for THC.  Pt has current DWI charge and upcoming court date on 08/11/13.  Pt states he's been drinking since his teens, but began drinking heavily after his mother became sick,  3 yrs ago.  Pt has previous inpt detox with [facility] in 2013.  Pt denies issues with seizures or blackouts.  Pt c/o w/d sxs: shakes, nausea and sweats.  Pt says he wants to stop drinking because--"it's interfering with my life and job, I'm tired of feeling this way.    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 Hypertension    Axis IV: other psychosocial or environmental problems, problems related to social environment and problems with primary support group Axis V: 51-60 moderate symptoms  Past Medical History:  Past Medical History  Diagnosis Date  . Hypertension     Past Surgical History  Procedure Laterality Date  . Tonsillectomy    . Fracture surgery      Family History:  Family History  Problem Relation Age of Onset  . Cancer Mother   . Heart failure Mother   . Cancer Father     Social History:  reports that he has been smoking Cigarettes.  He has a 25 pack-year smoking history. He has never used smokeless tobacco. He reports that he drinks alcohol. He reports that he uses illicit drugs (Marijuana).  Additional Social History:  Alcohol / Drug Use Pain Medications: None  Prescriptions: None  Over the Counter: None  History of alcohol / drug use?: Yes Longest period of sobriety (when/how long): 1 yr ago while in detox  Negative Consequences of Use: Legal;Personal relationships Withdrawal Symptoms: Nausea / Vomiting;Sweats;Tremors Substance  #1 Name of Substance 1: Alcohol  1 - Age of First Use: Teens  1 - Amount (size/oz): 1/5 Bourbon; 3-24oz Beers  1 - Frequency: Daily  1 - Duration: On-going  1 - Last Use / Amount: 08/05/13  CIWA: CIWA-Ar BP: 141/85 mmHg Pulse Rate: 75 Nausea and Vomiting: no nausea and no vomiting Tactile Disturbances: none Tremor: moderate, with patient's arms extended Auditory Disturbances: not present Paroxysmal Sweats: no sweat visible Visual Disturbances: not present Anxiety: mildly anxious Headache, Fullness in Head: none present Agitation: normal activity Orientation and Clouding of Sensorium: oriented and can do serial additions CIWA-Ar Total: 5 COWS:    Allergies: No Known Allergies  Home Medications:  (Not in a hospital admission)  OB/GYN Status:  No LMP for male patient.  General Assessment Data Location of Assessment: [facility] ED Is this a Tele or Face-to-Face Assessment?: Tele Assessment Is this an Initial Assessment or a Re-assessment for this encounter?: Initial Assessment Living Arrangements: Alone Can pt return to current living arrangement?: Yes Admission Status: Voluntary Is patient capable of signing voluntary admission?: Yes Transfer from: Acute Hospital Referral Source: MD  Medical Screening Exam [facility] Walk-in ONLY) Medical Exam completed: No Reason for MSE not completed: Other:  [facility] Crisis Care Plan Living Arrangements: Alone Name of Psychiatrist: None  Name of Therapist: None   Education Status Is patient currently in school?: No Current Grade: None  Highest grade of school patient has completed: None  Name of school: None  Contact person: None   Risk to self Suicidal Ideation: No Suicidal Intent: No Is patient at risk for suicide?: No Suicidal Plan?: No Access to Means: No What has been your use of drugs/alcohol within the last 12 months?: Abusing: alcohol; pt UDS + for THC; denies use  Previous Attempts/Gestures: No How many times?: 0 Other Self  Harm Risks: None  Triggers for Past Attempts: None known Intentional Self Injurious Behavior: None Family Suicide History: No Recent stressful life event(s): Other (Comment) (Work related issues) Persecutory voices/beliefs?: No Depression: Yes Depression Symptoms: Loss of interest in usual pleasures;Feeling worthless/self pity Substance abuse history and/or treatment for substance abuse?: Yes Suicide prevention information given to non-admitted patients: Not applicable  Risk to Others Homicidal Ideation: No Thoughts of Harm to Others: No Current Homicidal Intent: No Current Homicidal Plan: No Access to Homicidal Means: No Identified Victim: None  History of harm to others?: No Assessment of Violence: None Noted Violent Behavior Description: None  Does patient have access to weapons?: No Criminal Charges Pending?: Yes Describe Pending Criminal Charges: DWI  Does patient have a court date: Yes Court Date: 08/11/13  Psychosis Hallucinations: None noted Delusions: None noted  Mental Status Report Appear/Hygiene: Disheveled Eye Contact: Fair Motor Activity: Tremors Speech: Logical/coherent Level of Consciousness: Alert Mood: Depressed Affect: Depressed Anxiety Level: None Thought Processes: Coherent;Relevant Judgement: Unimpaired Orientation: Person;Place;Time;Situation Obsessive Compulsive Thoughts/Behaviors: None  Cognitive Functioning Concentration: Normal Memory: Recent Intact;Remote Intact IQ: Average Insight: Fair Impulse Control: Fair Appetite: Good Weight Loss: 0 Weight Gain: 0 Sleep: No Change Total Hours of Sleep: 7 Vegetative Symptoms: None  ADLScreening [facility] Assessment Services) Patient's cognitive ability adequate to safely complete daily activities?: Yes Patient able to express need for assistance with ADLs?: Yes Independently performs ADLs?: Yes (appropriate for developmental age)  Prior Inpatient Therapy Prior Inpatient Therapy: Yes Prior  Therapy Dates: 2013 Prior Therapy Facilty/Provider(s): [facility]  Reason for Treatment: Detox/Rehab   Prior Outpatient Therapy Prior Outpatient Therapy: No Prior Therapy Dates: None  Prior Therapy Facilty/Provider(s): None  Reason for Treatment: None   ADL Screening (condition at time of admission) Patient's cognitive ability adequate to safely complete daily activities?: Yes Is the patient deaf or have difficulty hearing?: No Does the patient have difficulty seeing, even when wearing glasses/contacts?: No Does the patient have difficulty concentrating, remembering, or making decisions?: No Patient able to express need for assistance with ADLs?: Yes Does the patient have difficulty dressing or bathing?: No Independently performs ADLs?: Yes (appropriate for developmental age) Does the patient have difficulty walking or climbing stairs?: No Weakness of Legs: None Weakness of Arms/Hands: None  Home Assistive Devices/Equipment Home Assistive Devices/Equipment: None  Therapy Consults (therapy consults require a physician order) PT Evaluation Needed: No OT Evalulation Needed: No SLP Evaluation Needed: No Abuse/Neglect Assessment (Assessment to be complete while patient is alone) Physical Abuse: Denies Verbal Abuse: Denies Sexual Abuse: Denies Exploitation of patient/patient's resources: Denies Self-Neglect: Denies Values / Beliefs Cultural Requests During Hospitalization: None Spiritual Requests During Hospitalization: None Consults Spiritual Care Consult Needed: No Social Work Consult Needed: No Merchant navy officer (For Healthcare) Advance Directive: Patient does not have advance directive;Patient would not like information Pre-existing out of facility DNR order (yellow form or pink MOST form): No Nutrition Screen- [facility] Adult/[facility]/AP Patient's home diet: Regular  Additional Information 1:1 In Past 12 Months?: No CIRT Risk: No Elopement Risk: No Does patient have medical clearance?:  No     Disposition:  Disposition Initial Assessment Completed for  this Encounter: Yes Disposition of Patient: Inpatient treatment program;Referred to ([facility] ) Type of inpatient treatment program: Adult Patient referred to: [facility]  On Site Evaluation by:   Reviewed with Physician:    Murrell Redden 08/06/2013 6:59 AM

## 2013-08-06 NOTE — ED Notes (Addendum)
Patient given all belongings back by Madelaine Bhat, EDT1.   Dr. Oletta Lamas notified that discharge papers are needed.   [facility] to pick up patient between 1400 and 1430.

## 2013-08-06 NOTE — ED Provider Notes (Signed)
Pt is accepted at [facility], ok to discharge pt from ED.    Arthur Mcmahon. Shilpa Bushee, MD 08/06/13 1348

## 2013-08-06 NOTE — ED Notes (Signed)
Pt belongings collected and documented. Pts wallet with $15 and cell phone given to pts family member to take home per pts request.

## 2013-08-06 NOTE — BH Assessment (Signed)
Shayla at [facility] called [facility] to do a screening for pt. Drema Pry will consult with the RN at [facility] and call back with an admission decision.  Dossie Arbour, MA  Disposition [facility]

## 2013-08-06 NOTE — ED Notes (Signed)
Patient received meal tray 

## 2013-08-06 NOTE — ED Notes (Signed)
Patient requests a nicotine patch.

## 2013-08-06 NOTE — Discharge Instructions (Signed)
 Alcohol Problems Most adults who drink alcohol drink in moderation (not a lot) are at low risk for developing problems related to their drinking. However, all drinkers, including low-risk drinkers, should know about the health risks connected with drinking alcohol. RECOMMENDATIONS FOR LOW-RISK DRINKING  Drink in moderation. Moderate drinking is defined as follows:   Men - no more than 2 drinks per day.  Nonpregnant women - no more than 1 drink per day.  Over age 7 - no more than 1 drink per day. A standard drink is 12 grams of pure alcohol, which is equal to a 12 ounce bottle of beer or wine cooler, a 5 ounce glass of wine, or 1.5 ounces of distilled spirits (such as whiskey, brandy, vodka, or rum).  ABSTAIN FROM (DO NOT DRINK) ALCOHOL:  When pregnant or considering pregnancy.  When taking a medication that interacts with alcohol.  If you are alcohol dependent.  A medical condition that prohibits drinking alcohol (such as ulcer, liver disease, or heart disease). DISCUSS WITH YOUR CAREGIVER:  If you are at risk for coronary heart disease, discuss the potential benefits and risks of alcohol use: Light to moderate drinking is associated with lower rates of coronary heart disease in certain populations (for example, men over age 25 and postmenopausal women). Infrequent or nondrinkers are advised not to begin light to moderate drinking to reduce the risk of coronary heart disease so as to avoid creating an alcohol-related problem. Similar protective effects can likely be gained through proper diet and exercise.  Women and the elderly have smaller amounts of body water  than men. As a result women and the elderly achieve a higher blood alcohol concentration after drinking the same amount of alcohol.  Exposing a fetus to alcohol can cause a broad range of birth defects referred to as Fetal Alcohol Syndrome (FAS) or Alcohol-Related Birth Defects (ARBD). Although FAS/ARBD is connected with excessive  alcohol consumption during pregnancy, studies also have reported neurobehavioral problems in infants born to mothers reporting drinking an average of 1 drink per day during pregnancy.  Heavier drinking (the consumption of more than 4 drinks per occasion by men and more than 3 drinks per occasion by women) impairs learning (cognitive) and psychomotor functions and increases the risk of alcohol-related problems, including accidents and injuries. CAGE QUESTIONS:   Have you ever felt that you should Cut down on your drinking?  Have people Annoyed you by criticizing your drinking?  Have you ever felt bad or Guilty about your drinking?  Have you ever had a drink first thing in the morning to steady your nerves or get rid of a hangover (Eye opener)? If you answered positively to any of these questions: You may be at risk for alcohol-related problems if alcohol consumption is:   Men: Greater than 14 drinks per week or more than 4 drinks per occasion.  Women: Greater than 7 drinks per week or more than 3 drinks per occasion. Do you or your family have a medical history of alcohol-related problems, such as:  Blackouts.  Sexual dysfunction.  Depression.  Trauma.  Liver dysfunction.  Sleep disorders.  Hypertension.  Chronic abdominal pain.  Has your drinking ever caused you problems, such as problems with your family, problems with your work (or school) performance, or accidents/injuries?  Do you have a compulsion to drink or a preoccupation with drinking?  Do you have poor control or are you unable to stop drinking once you have started?  Do you have to drink to  avoid withdrawal symptoms?  Do you have problems with withdrawal such as tremors, nausea, sweats, or mood disturbances?  Does it take more alcohol than in the past to get you high?  Do you feel a strong urge to drink?  Do you change your plans so that you can have a drink?  Do you ever drink in the morning to relieve  the shakes or a hangover? If you have answered a number of the previous questions positively, it may be time for you to talk to your caregivers, family, and friends and see if they think you have a problem. Alcoholism is a chemical dependency that keeps getting worse and will eventually destroy your health and relationships. Many alcoholics end up dead, impoverished, or in prison. This is often the end result of all chemical dependency.  Do not be discouraged if you are not ready to take action immediately.  Decisions to change behavior often involve up and down desires to change and feeling like you cannot decide.  Try to think more seriously about your drinking behavior.  Think of the reasons to quit. WHERE TO GO FOR ADDITIONAL INFORMATION   The National Institute on Alcohol Abuse and Alcoholism (NIAAA) BasicStudents.dk  [employer] on Alcoholism and Drug Dependence (NCADD) www.ncadd.org  American Society of Addiction Medicine (ASAM) RoyalDiary.gl  Document Released: 08/21/2005 Document Revised: 11/13/2011 Document Reviewed: 04/08/2008 [facility] Patient Information 2014 [place], [place].     Alcohol Withdrawal Anytime drug use is interfering with normal living activities it has become abuse. This includes problems with family and friends. Psychological dependence has developed when your mind tells you that the drug is needed. This is usually followed by physical dependence when a continuing increase of drugs are required to get the same feeling or high. This is known as addiction or chemical dependency. A person's risk is much higher if there is a history of chemical dependency in the family. Mild Withdrawal Following Stopping Alcohol, When Addiction or Chemical Dependency Has Developed When a person has developed tolerance to alcohol, any sudden stopping of alcohol can cause uncomfortable physical symptoms. Most of the time these are mild and consist of tremors in the hands and  increases in heart rate, breathing, and temperature. Sometimes these symptoms are associated with anxiety, panic attacks, and bad dreams. There may also be stomach upset. Normal sleep patterns are often interrupted with periods of inability to sleep (insomnia). This may last for 6 months. Because of this discomfort, many people choose to continue drinking to get rid of this discomfort and to try to feel normal. Severe Withdrawal with Decreased or No Alcohol Intake, When Addiction or Chemical Dependency Has Developed About five percent of alcoholics will develop signs of severe withdrawal when they stop using alcohol. One sign of this is development of generalized seizures (convulsions). Other signs of this are severe agitation and confusion. This may be associated with believing in things which are not real or seeing things which are not really there (delusions and hallucinations). Vitamin deficiencies are usually present if alcohol intake has been long-term. Treatment for this most often requires hospitalization and close observation. Addiction can only be helped by stopping use of all chemicals. This is hard but may save your life. With continual alcohol use, possible outcomes are usually loss of self respect and esteem, violence, and death. Addiction cannot be cured but it can be stopped. This often requires outside help and the care of professionals. Treatment centers are listed in the yellow pages under Cocaine,  Narcotics, and Alcoholics Anonymous. Most hospitals and clinics can refer you to a specialized care center. It is not necessary for you to go through the uncomfortable symptoms of withdrawal. Your caregiver can provide you with medicines that will help you through this difficult period. Try to avoid situations, friends, or drugs that made it possible for you to keep using alcohol in the past. Learn how to say no. It takes a long period of time to overcome addictions to all drugs, including  alcohol. There may be many times when you feel as though you want a drink. After getting rid of the physical addiction and withdrawal, you will have a lessening of the craving which tells you that you need alcohol to feel normal. Call your caregiver if more support is needed. Learn who to talk to in your family and among your friends so that during these periods you can receive outside help. Alcoholics Anonymous (AA) has helped many people over the years. To get further help, contact AA or call your caregiver, counselor, or clergyperson. Al-Anon and Alateen are support groups for friends and family members of an alcoholic. The people who love and care for an alcoholic often need help, too. For information about these organizations, check your phone directory or call a local alcoholism treatment center.  SEEK IMMEDIATE MEDICAL CARE IF:   You have a seizure.  You have a fever.  You experience uncontrolled vomiting or you vomit up blood. This may be bright red or look like black coffee grounds.  You have blood in the stool. This may be bright red or appear as a black, tarry, bad-smelling stool.  You become lightheaded or faint. Do not drive if you feel this way. Have someone else drive you or call 088 for help.  You become more agitated or confused.  You develop uncontrolled anxiety.  You begin to see things that are not really there (hallucinate). Your caregiver has determined that you completely understand your medical condition, and that your mental state is back to normal. You understand that you have been treated for alcohol withdrawal, have agreed not to drink any alcohol for a minimum of 1 day, will not operate a car or other machinery for 24 hours, and have had an opportunity to ask any questions about your condition. Document Released: 05/31/2005 Document Revised: 11/13/2011 Document Reviewed: 04/08/2008 [facility] Patient Information 2014 [place], [place].      Emergency Department  Resource Guide 1) Find a Doctor and Pay Out of Pocket Although you won't have to find out who is covered by your insurance plan, it is a good idea to ask around and get recommendations. You will then need to call the office and see if the doctor you have chosen will accept you as a new patient and what types of options they offer for patients who are self-pay. Some doctors offer discounts or will set up payment plans for their patients who do not have insurance, but you will need to ask so you aren't surprised when you get to your appointment.  2) Contact Your Local Health Department Not all health departments have doctors that can see patients for sick visits, but many do, so it is worth a call to see if yours does. If you don't know where your local health department is, you can check in your phone book. The CDC also has a tool to help you locate your state's health department, and many state websites also have listings of all of their local  health departments.  3) Find a Walk-in Clinic If your illness is not likely to be very severe or complicated, you may want to try a walk in clinic. These are popping up all over the country in pharmacies, drugstores, and shopping centers. They're usually staffed by nurse practitioners or physician assistants that have been trained to treat common illnesses and complaints. They're usually fairly quick and inexpensive. However, if you have serious medical issues or chronic medical problems, these are probably not your best option.  No Primary Care Doctor: - Call Health Connect at  [phone] - they can help you locate a primary care doctor that  accepts your insurance, provides certain services, etc. - Physician Referral Service- [phone]  Chronic Pain Problems: Organization         Address  Phone   Notes  [facility]  [phone] Patients need to be referred by their primary care doctor.   Medication Assistance: Organization          Address  Phone   Notes  [facility] Medication [facility] [address] [place] [place], [place] [address] [phone] --Must be a resident of [facility] -- Must have NO insurance coverage whatsoever (no Medicaid/ Medicare, etc.) -- The pt. MUST have a primary care doctor that directs their care regularly and follows them in the community   [facility]ist  [phone]   [employer]  [phone]    Agencies that provide inexpensive medical care: Organization         Address  Phone   Notes  [facility]  [phone]   [facility]    [phone]   [facility] [address] [place], [place] [address] [phone]   [facility] [address] [place] [phone]   Planned Parenthood    [phone]   [facility]    [phone]   [facility]  [address], [place] Phone:  [phone], Fax:  [phone] Hours of Operation:  9 am - 6 pm, M-F.  Also accepts Medicaid/Medicare and self-pay.  [facility]  [address], [place] Phone: [phone], Fax: [phone]. Hours of Operation:  8:30 am - 5:30 pm, M-F.  Also accepts Medicaid and self-pay.  [facility] [place] [address], [place] Phone: [phone]   [facility] [address] Deitra Fonder [place], [place] [phone], Ext. [phone] Mondays & Thursdays: 7-9 AM.  First 15 patients are seen on a first come, first serve basis.    Medicaid-accepting [facility] Providers:  Organization         Address  Phone   Notes  [facility] [address], [place] [phone] Also accepts self-pay patients.  [facility] [address] Christianna Clover [place], [place]  [phone]   [facility] [address], [place] [phone]   [facility] [address] [place] [phone]   Kennieth Leech [address], [place]   [phone] Only accepts [place] Access [place] patients after they have their name applied to their card.   Self-Pay (no insurance) in [facility]:  Organization         Address  Phone   Notes  Sickle Cell Patients, [employer]  Internal Medicine [address] [place], [place] [phone]   [facility] [address] [place], [place] [phone]   [facility] [place]  [address], [place] [phone]   [facility]/Dr. Osei-Bonsu  [address], [place] or [address], [place] [phone] Phone number for both [place] and [place] locations is the same.  [facility] [address] [place] [phone]   [facility] [address] [place] or [address] [phone]   [facility] [address] [place] [phone], phone; [phone], fax Sees patients 1st and 3rd Saturday of every month.  Must not qualify for public or private insurance (i.e. Medicaid, Medicare, [place] Health Choice, Veterans' Benefits) . Household income should be no more than 200% of the poverty level .The clinic cannot treat you if you are pregnant or think you are pregnant . Sexually transmitted diseases are not treated at the clinic.    Dental Care: Organization         Address  Phone  Notes  [facility] Department of [facility] [address] [place], [place] [phone] Accepts children up to age 63 who are enrolled in [place] or [place] Health Choice; pregnant women with a Medicaid card; and children who have applied for Medicaid or [place] Health Choice, but were declined, whose parents can pay a reduced fee at time of service.  [facility] Department of [facility]  [address], [place] [phone] Accepts children up to age 47 who are enrolled in [place] or [place] Health Choice; pregnant women with a Medicaid card; and children who have applied for Medicaid or [place] Health Choice, but were declined, whose parents can pay a reduced fee at time of service.  [facility]  [address] [place], [place] [phone] Patients are seen by appointment only. Walk-ins are not accepted. [facility] will see patients 8 years of age and older. Monday - Tuesday (8am-5pm) Most Wednesdays (8:30-5pm) $30 per visit, cash only  [facility] Adult Dental Access PROGRAM  [address], [facility] [phone] Patients are seen by appointment only. Walk-ins are not accepted. [facility] will see patients 48 years of age and older. One Wednesday Evening (Monthly: Volunteer Based).  $30 per visit, cash only  [employer]  [phone] for adults; Children under age 13, call Graduate Pediatric Dentistry at [phone]. Children aged 47-14, please call [phone] to request a pediatric application.  Dental services are provided in all areas of dental care including fillings, crowns and bridges, complete and partial dentures, implants, gum treatment, root canals, and extractions. Preventive care is also provided. Treatment is provided to both adults and children. Patients are selected via a lottery and there is often a waiting list.   [facility] [address] [place]  [phone] [email or web address]   [facility] [address] [place], [place] [phone], Ext. [phone] Second and Fourth Thursday of each month, opens at 6:30 AM; Clinic ends at 9 AM.  Patients are seen on a first-come first-served basis, and a limited number are seen during each clinic.   [facility]  [address] [place] Fonder [place], [place] [phone]   Eligibility Requirements You must  have  lived in [place], [place], or [place] counties for at least the last three months.   You cannot be eligible for state or federal sponsored [place], including [employer], [place], or [employer].   You generally cannot be eligible for healthcare insurance through your employer.    How to apply: Eligibility screenings are held every Tuesday and Wednesday afternoon from 1:00 pm until 4:00 pm. You do not need an appointment for the interview!  [facility] [address], [place], [place] [phone]   [facility]  [phone]   [facility] Health Department  [phone]   [facility] Health Department  [phone]    Behavioral Health Resources in the Community: Intensive Outpatient Programs Organization         Address  Phone  Notes  [facility] [address], [place], [place] [phone]   [facility] Outpatient [address], [place], [place] [phone]   [facility] [address], [place], [place]  [phone]   [facility] Mental Health [address]  [place], [place] [phone] or [phone]   Substance Abuse Resources Organization         Address  Phone  Notes  [facility] Drug [facility]  [phone]   [facility]  [phone]   [facility]  [phone]   [place]  [phone]   Residential & Outpatient Substance Abuse Program  [phone]   Psychological Services Organization         Address  Phone  Notes  [facility]  [phone]   [facility] Services  [phone]   [facility] Mental Health [address] [place] [phone] or [phone]    Mobile Crisis Teams Organization         Address  Phone  Notes  [facility], Mobile Crisis Care Unit  [phone]   [facility]  [address] [place], [place] [phone]   Reena Pax [address] [place] [phone]    Self-Help/Support Groups Organization         Address  Phone             Notes  [facility] [place] - variety of support groups  [phone] Call for more information  Narcotics Anonymous (NA), [facility] [address], [employer] [place]  2 meetings at this location   Statistician         Address  Phone  Notes  [facility] [address],    [place]  [phone]   [facility]  [address], [place] [address], [place], [place] [phone]   [facility] [address] [place], [place] [phone] Admissions: 8am-3pm M-F  [facility] [address],    [place], [place] [phone]   [facility] [address] [place], [place] [phone]   [facility] [address],  [place], [place] [phone]   [facility] - Intensive Outpatient [address], [place], [place] [phone]   [facility] ([facility] [address] [place]  [employer], [place] [phone] or [phone]   [facility] ([facility]) [address], [place], [place] [phone] Accepts Medicaid  [facility] [address]  [place] [phone] Substance Abuse/Addiction Treatment   [facility] Resources Organization         Address  Phone  Notes  [facility]  [phone]   Mliss Rival, PhD [address] [place] LABOR [place], [place]   [phone] or [phone]   [facility]   [address] [place], [place] [phone]   [facility] [address], [place], [place] [phone] Insurance/Medicaid/sponsorship through [facility] and Families [address]                                    [place], [place] [phone] Therapy/tele-psych/case  [facility] [address], [place] [phone]    Dr. Curry  [phone]   [facility]  [employer] [facility] Dept. 1) [address] [place] 2) [address] [place] 3)  [address], [place] [phone]  [phone]   [facility] Child Abuse Hotline [phone] or [phone] (After Hours)

## 2013-09-05 ENCOUNTER — Emergency Department (HOSPITAL_COMMUNITY)
Admission: EM | Admit: 2013-09-05 | Discharge: 2013-09-05 | Disposition: A | Discharge status: Home / Self Care | Payer: Self-pay | Attending: Emergency Medicine | Admitting: Emergency Medicine

## 2013-09-05 ENCOUNTER — Encounter (HOSPITAL_COMMUNITY): Payer: Self-pay | Admitting: Emergency Medicine

## 2013-09-05 DIAGNOSIS — L0100 Impetigo, unspecified: Secondary | ICD-10-CM | POA: Insufficient documentation

## 2013-09-05 DIAGNOSIS — L739 Follicular disorder, unspecified: Secondary | ICD-10-CM

## 2013-09-05 DIAGNOSIS — I10 Essential (primary) hypertension: Secondary | ICD-10-CM | POA: Insufficient documentation

## 2013-09-05 DIAGNOSIS — Z79899 Other long term (current) drug therapy: Secondary | ICD-10-CM | POA: Insufficient documentation

## 2013-09-05 DIAGNOSIS — L738 Other specified follicular disorders: Secondary | ICD-10-CM | POA: Insufficient documentation

## 2013-09-05 DIAGNOSIS — F17200 Nicotine dependence, unspecified, uncomplicated: Secondary | ICD-10-CM | POA: Insufficient documentation

## 2013-09-05 DIAGNOSIS — L678 Other hair color and hair shaft abnormalities: Secondary | ICD-10-CM | POA: Insufficient documentation

## 2013-09-05 MED ORDER — CEPHALEXIN 500 MG PO CAPS
500.0000 mg | ORAL_CAPSULE | Freq: Four times a day (QID) | ORAL | Status: DC
Start: 2013-09-05 — End: 2014-06-13

## 2013-09-05 MED ORDER — SULFAMETHOXAZOLE-TRIMETHOPRIM 800-160 MG PO TABS: 1.0000 | ORAL_TABLET | Freq: Two times a day (BID) | ORAL | Status: DC

## 2013-09-05 NOTE — Discharge Instructions (Signed)
Please take all of your antibiotics until finished!   You may develop abdominal discomfort or diarrhea from the antibiotic.  You may help offset this with probiotics which you can buy or get in yogurt. Do not eat  or take the probiotics until 2 hours after your antibiotic.   Folliculitis  Folliculitis is redness, soreness, and swelling (inflammation) of the hair follicles. This condition can occur anywhere on the body. People with weakened immune systems, diabetes, or obesity have a greater risk of getting folliculitis. CAUSES  Bacterial infection. This is the most common cause.  Fungal infection.  Viral infection.  Contact with certain chemicals, especially oils and tars. Long-term folliculitis can result from bacteria that live in the nostrils. The bacteria may trigger multiple outbreaks of folliculitis over time. SYMPTOMS Folliculitis most commonly occurs on the scalp, thighs, legs, back, buttocks, and areas where hair is shaved frequently. An early sign of folliculitis is a small, white or yellow, pus-filled, itchy lesion (pustule). These lesions appear on a red, inflamed follicle. They are usually less than 0.2 inches (5 mm) wide. When there is an infection of the follicle that goes deeper, it becomes a boil or furuncle. A group of closely packed boils creates a larger lesion (carbuncle). Carbuncles tend to occur in hairy, sweaty areas of the body. DIAGNOSIS  Your caregiver can usually tell what is wrong by doing a physical exam. A sample may be taken from one of the lesions and tested in a lab. This can help determine what is causing your folliculitis. TREATMENT  Treatment may include:  Applying warm compresses to the affected areas.  Taking antibiotic medicines orally or applying them to the skin.  Draining the lesions if they contain a large amount of pus or fluid.  Laser hair removal for cases of long-lasting folliculitis. This helps to prevent regrowth of the hair. HOME CARE  INSTRUCTIONS  Apply warm compresses to the affected areas as directed by your caregiver.  If antibiotics are prescribed, take them as directed. Finish them even if you start to feel better.  You may take over-the-counter medicines to relieve itching.  Do not shave irritated skin.  Follow up with your caregiver as directed. SEEK IMMEDIATE MEDICAL CARE IF:   You have increasing redness, swelling, or pain in the affected area.  You have a fever. MAKE SURE YOU:  Understand these instructions.  Will watch your condition.  Will get help right away if you are not doing well or get worse. Document Released: 10/30/2001 Document Revised: 02/20/2012 Document Reviewed: 11/21/2011 [facility] Patient Information 2014 [place], [place].  Impetigo Impetigo is an infection of the skin, most common in babies and children.  CAUSES  It is caused by staphylococcal or streptococcal germs (bacteria). Impetigo can start after any damage to the skin. The damage to the skin may be from things like:   Chickenpox.  Scrapes.  Scratches.  Insect bites (common when children scratch the bite).  Cuts.  Nail biting or chewing. Impetigo is contagious. It can be spread from one person to another. Avoid close skin contact, or sharing towels or clothing. SYMPTOMS  Impetigo usually starts out as small blisters or pustules. Then they turn into tiny yellow-crusted sores (lesions).  There may also be:  Large blisters.  Itching or pain.  Pus.  Swollen lymph glands. With scratching, irritation, or non-treatment, these small areas may get larger. Scratching can cause the germs to get under the fingernails; then scratching another part of the skin can cause the  infection to be spread there. DIAGNOSIS  Diagnosis of impetigo is usually made by a physical exam. A skin culture (test to grow bacteria) may be done to prove the diagnosis or to help decide the best treatment.  TREATMENT  Mild impetigo can be treated  with prescription antibiotic cream. Oral antibiotic medicine may be used in more severe cases. Medicines for itching may be used. HOME CARE INSTRUCTIONS   To avoid spreading impetigo to other body areas:  Keep fingernails short and clean.  Avoid scratching.  Cover infected areas if necessary to keep from scratching.  Gently wash the infected areas with antibiotic soap and water.  Soak crusted areas in warm soapy water using antibiotic soap.  Gently rub the areas to remove crusts. Do not scrub.  Wash hands often to avoid spread this infection.  Keep children with impetigo home from school or daycare until they have used an antibiotic cream for 48 hours (2 days) or oral antibiotic medicine for 24 hours (1 day), and their skin shows significant improvement.  Children may attend school or daycare if they only have a few sores and if the sores can be covered by a bandage or clothing. SEEK MEDICAL CARE IF:   More blisters or sores show up despite treatment.  Other family members get sores.  Rash is not improving after 48 hours (2 days) of treatment. SEEK IMMEDIATE MEDICAL CARE IF:   You see spreading redness or swelling of the skin around the sores.  You see red streaks coming from the sores.  Your child develops a fever of 100.4 F (37.2 C) or higher.  Your child develops a sore throat.  Your child is acting ill (lethargic, sick to their stomach). Document Released: 08/18/2000 Document Revised: 11/13/2011 Document Reviewed: 06/17/2008 [facility] Patient Information 2014 [place].

## 2013-09-05 NOTE — ED Notes (Signed)
Pt's wife at bedside.  She denies any rash.  Pt has been trying various OTC creams resulting in worsening of symptoms.

## 2013-09-05 NOTE — ED Provider Notes (Signed)
CSN: [id number]     Arrival date & time 09/05/13  5361 History   This chart was scribed for non-physician practitioner working with Margarita Mail, PA-C   Mervin Kung, MD by Mercy Moore, ED Scribe. This patient was seen in room TR08C/TR08C and the patient's care was started at 10:42 AM.   Chief Complaint  Patient presents with  . Allergic Reaction   The history is provided by the patient. No language interpreter was used.   HPI Comments: Arthur Mcmahon is a 48 y.o. male who presents to the Emergency Department complaining of 3 weeks of gradual onset, constant, gradually worsening rash to hands, legs, beard, and chest, and armpits. Patient reports associated itching to the areas on the body and burning to the beard. Patient says that he has been medicating with expired penicillin that was not prescribed to him. Also used calalmine and hydrocortizone without relief. Patient was recently discharged form a detox facility. Denies fevers, chills, myalgias, arthralgias.No contacts with similar sxs. No changes in lotions soaps or detergents.   Past Medical History  Diagnosis Date  . Hypertension    Past Surgical History  Procedure Laterality Date  . Tonsillectomy    . Fracture surgery     Family History  Problem Relation Age of Onset  . Cancer Mother   . Heart failure Mother   . Cancer Father    History  Substance Use Topics  . Smoking status: Current Every Day Smoker -- 1.50 packs/day for 25 years    Types: Cigarettes  . Smokeless tobacco: Never Used  . Alcohol Use: No     Comment: everyday    Review of Systems  Constitutional: Negative for fever and chills.  Skin: Positive for rash.    Allergies  Review of patient's allergies indicates no known allergies.  Home Medications   Current Outpatient Rx  Name  Route  Sig  Dispense  Refill  . B Complex-C (B-COMPLEX WITH VITAMIN C) tablet   Oral   Take 1 tablet by mouth daily.         . calamine lotion   Topical  Apply 1 application topically as needed for itching.         . hydrocortisone 1 % lotion   Topical   Apply 1 application topically 2 (two) times daily as needed for itching.         . Poison Ivy Treatments (IVY DRY SCRUB EX)   Apply externally   Apply 1 application topically 2 (two) times daily.          Triage Vitals: BP 158/91  Pulse 97  Temp(Src) 97.8 F (36.6 C) (Oral)  Resp 20  Wt 211 lb (95.709 kg)  SpO2 100% Physical Exam  Nursing note and vitals reviewed. Constitutional: He is oriented to person, place, and time. He appears well-developed and well-nourished. No distress.  HENT:  Head: Normocephalic and atraumatic.  Eyes: Conjunctivae are normal. Right eye exhibits no discharge. Left eye exhibits no discharge.  Neck: Normal range of motion.  Cardiovascular: Normal rate.   Pulmonary/Chest: Effort normal. No respiratory distress.  Musculoskeletal: Normal range of motion. He exhibits no edema.  Neurological: He is alert and oriented to person, place, and time.  Skin: Skin is warm and dry. Rash noted. There is erythema.  Multiple erythematous, pustular, isolated, singular, nonconfluent lesions that involves beard, hands, chest hair and thighs.   Left side of beard: Multiple follicular, pustular, honey colored crusting proximal to left ear.  Psychiatric:  He has a normal mood and affect. Thought content normal.    ED Course  Procedures (including critical care time) DIAGNOSTIC STUDIES: Oxygen Saturation is 100% on room air, normal by my interpretation.    COORDINATION OF CARE: 10:46 AM- Will prescribe Septra and Keflex. Pt advised of plan for treatment and pt agrees.    Labs Review Labs Reviewed - No data to display Imaging Review No results found.  EKG Interpretation   None       MDM   1. Folliculitis   2. Impetigo    Patient with mixed skin infection - appears consistent with folliculitis/impetigo, likely got at detox facility. Will d/c with  bactrim keflex to cover for staph/strep and MRSA. Return precautions discussed I personally performed the services described in this documentation, which was scribed in my presence. The recorded information has been reviewed and is accurate.         Margarita Mail, PA-C 09/05/13 1138

## 2013-09-05 NOTE — ED Notes (Signed)
Patient states he has been treating himself for poison oak on his L hand, trunk, arms, legs.   Patient states he thinks may be allergic reaction, has moved to face also.  Raised areas with some blistering.   Patient states his face burns, but everywhere else mostly itching.

## 2013-09-08 NOTE — ED Provider Notes (Signed)
Medical screening examination/treatment/procedure(s) were performed by non-physician practitioner and as supervising physician I was immediately available for consultation/collaboration.  EKG Interpretation   None         Mervin Kung, MD 09/08/13 [phone]

## 2014-03-30 ENCOUNTER — Emergency Department (HOSPITAL_COMMUNITY)
Admission: EM | Admit: 2014-03-30 | Discharge: 2014-03-30 | Disposition: A | Discharge status: Home / Self Care | Payer: Self-pay | Attending: Emergency Medicine | Admitting: Emergency Medicine

## 2014-03-30 ENCOUNTER — Encounter (HOSPITAL_COMMUNITY): Payer: Self-pay | Admitting: Emergency Medicine

## 2014-03-30 DIAGNOSIS — S61219A Laceration without foreign body of unspecified finger without damage to nail, initial encounter: Secondary | ICD-10-CM

## 2014-03-30 DIAGNOSIS — IMO0002 Reserved for concepts with insufficient information to code with codable children: Secondary | ICD-10-CM | POA: Insufficient documentation

## 2014-03-30 DIAGNOSIS — W268XXA Contact with other sharp object(s), not elsewhere classified, initial encounter: Secondary | ICD-10-CM | POA: Insufficient documentation

## 2014-03-30 DIAGNOSIS — Z23 Encounter for immunization: Secondary | ICD-10-CM | POA: Insufficient documentation

## 2014-03-30 DIAGNOSIS — F17200 Nicotine dependence, unspecified, uncomplicated: Secondary | ICD-10-CM | POA: Insufficient documentation

## 2014-03-30 DIAGNOSIS — S61209A Unspecified open wound of unspecified finger without damage to nail, initial encounter: Secondary | ICD-10-CM | POA: Insufficient documentation

## 2014-03-30 DIAGNOSIS — I10 Essential (primary) hypertension: Secondary | ICD-10-CM | POA: Insufficient documentation

## 2014-03-30 DIAGNOSIS — Z792 Long term (current) use of antibiotics: Secondary | ICD-10-CM | POA: Insufficient documentation

## 2014-03-30 DIAGNOSIS — Y9389 Activity, other specified: Secondary | ICD-10-CM | POA: Insufficient documentation

## 2014-03-30 DIAGNOSIS — Z79899 Other long term (current) drug therapy: Secondary | ICD-10-CM | POA: Insufficient documentation

## 2014-03-30 DIAGNOSIS — Y929 Unspecified place or not applicable: Secondary | ICD-10-CM | POA: Insufficient documentation

## 2014-03-30 MED ORDER — TETANUS-DIPHTH-ACELL PERTUSSIS 5-2.5-18.5 LF-MCG/0.5 IM SUSP
0.5000 mL | Freq: Once | INTRAMUSCULAR | Status: AC
  Administered 2014-03-30: 0.5 mL via INTRAMUSCULAR
  Filled 2014-03-30: qty 0.5

## 2014-03-30 NOTE — ED Notes (Signed)
Declined W/C at D/C and was escorted to lobby by RN. 

## 2014-03-30 NOTE — ED Provider Notes (Signed)
CSN: [id number]     Arrival date & time 03/30/14  1100 History  This chart was scribed for Clayton Bibles, PA-C, non-physician practitioner working with Babette Relic, MD by Vernell Barrier, ED scribe. This patient was seen in room TR09C/TR09C and the patient's care was started at 11:43 AM.    Chief Complaint  Patient presents with  . Extremity Laceration    finger, needs tetanus   The history is provided by the patient. No language interpreter was used.   HPI Comments: Arthur Mcmahon is a 48 y.o. male who presents to the Emergency Department complaining of a healing superficial laceration to the left dorsal index finger. Noted swelling and soreness with making a fist. States he got a fish hook caught in his finger and had to cut his skin to get the hook out. Has been using peroxide and neosporin to treat. Denies fever, red streaking, numbness, or weakness at the site of the injury.  Denies discharge.    Past Medical History  Diagnosis Date  . Hypertension    Past Surgical History  Procedure Laterality Date  . Tonsillectomy    . Fracture surgery     Family History  Problem Relation Age of Onset  . Cancer Mother   . Heart failure Mother   . Cancer Father    History  Substance Use Topics  . Smoking status: Current Every Day Smoker -- 1.50 packs/day for 25 years    Types: Cigarettes  . Smokeless tobacco: Never Used  . Alcohol Use: No     Comment: everyday    Review of Systems  Constitutional: Negative for fever.  Skin: Positive for wound.  Neurological: Negative for weakness and numbness.  All other systems reviewed and are negative.  Allergies  Review of patient's allergies indicates no known allergies.  Home Medications   Prior to Admission medications   Medication Sig Start Date End Date Taking? Authorizing Provider  B Complex-C (B-COMPLEX WITH VITAMIN C) tablet Take 1 tablet by mouth daily.    Historical Provider, MD  calamine lotion Apply 1 application topically as  needed for itching.    Historical Provider, MD  cephALEXin (KEFLEX) 500 MG capsule Take 1 capsule (500 mg total) by mouth 4 (four) times daily. 09/05/13   Margarita Mail, PA-C  hydrocortisone 1 % lotion Apply 1 application topically 2 (two) times daily as needed for itching.    Historical Provider, MD  Poison Ivy Treatments (IVY DRY SCRUB EX) Apply 1 application topically 2 (two) times daily.    Historical Provider, MD  sulfamethoxazole-trimethoprim (SEPTRA DS) 800-160 MG per tablet Take 1 tablet by mouth every 12 (twelve) hours. 09/05/13   Margarita Mail, PA-C   Physical Exam  Nursing note and vitals reviewed. Constitutional: He appears well-developed and well-nourished. No distress.  HENT:  Head: Normocephalic and atraumatic.  Neck: Neck supple.  Pulmonary/Chest: Effort normal.  Neurological: He is alert.  Skin: He is not diaphoretic.  Left index finger: sensation intact; capillary refill <2 seconds. Healing laceration over PIP joint. No erythema, edema. or warmth. Area is tender. Radial pulses intact. No streaking    ED Course  Procedures (including critical care time) COORDINATION OF CARE: At 11:48 AM: Discussed treatment plan with patient which includes continued monitoring of sxs at home. Discussed the possibility of antibiotics to prevent infection, used joint decision making and patient chose to treat with conservative measures at home and declined antibiotic use.  We discussed return precautions.     Labs  Review Labs Reviewed - No data to display  Imaging Review No results found.   EKG Interpretation None      MDM    Final diagnoses:  Finger laceration, initial encounter    Afebrile nontoxic patient with laceration to dorsal aspect of finger two days ago.  Pt cleaned and treated finger at home, came to ED for Tdap.  No e/o infection.  Joint decision making regarding antibiotics vs basic wound care.  Given appearance and healing appearing to be going well, pt cleaned wound  thoroughly and dressed it well, I agree he will likely heal well without oral antibiotic therapy.  Per pt the wound was fairly superficial.  Pt declines to fully flex PIP because he doesn't want scab to break up.  However, I doubt infection or tendon laceration.  Discussed result, findings, treatment, and follow up  with patient.  Pt given return precautions.  Pt verbalizes understanding and agrees with plan.      I personally performed the services described in this documentation, which was scribed in my presence. The recorded information has been reviewed and is accurate.     Clayton Bibles, PA-C 03/30/14 1215

## 2014-03-30 NOTE — Discharge Instructions (Signed)
Read the information below.  You may return to the Emergency Department at any time for worsening condition or any new symptoms that concern you.  If you develop redness, swelling, pus draining from the wound, or fevers greater than 100.4, return to the ER immediately for a recheck.     Fish Hook Removal A fish hook can cause a small cut or lesion that extends through all layers of the skin and into the subcutaneous tissue. Because of this, bacteria may get injected beneath the surface of the skin. A simple bandage (dressing) may be applied. This should be changed daily. Follow your caregiver's instructions regarding use of any antibacterial ointments.  Only take over-the-counter or prescription medicines for pain, discomfort, or fever as directed by your caregiver. If you did not receive a tetanus shot from your caregiver because you did not recall when your last one was given, make sure to check with your physician's office and determine if one is needed. Generally for a "dirty" wound, you should receive a tetanus booster if you have not had one in the last five years. If you have a "clean" wound, you should receive a tetanus booster if you have not had one within the last ten years. SEEK IMMEDIATE MEDICAL CARE IF:   You develop redness, swelling, or increasing pain in the wound.  You have a fever.  You notice a bad smell coming from the wound or dressing.  You notice pus or other unusual drainage coming from the wound. Document Released: 08/18/2000 Document Revised: 11/13/2011 Document Reviewed: 03/11/2009 [facility] Patient Information 2015 [place], [place]. This information is not intended to replace advice given to you by your health care provider. Make sure you discuss any questions you have with your health care provider.

## 2014-03-30 NOTE — ED Provider Notes (Signed)
Medical screening examination/treatment/procedure(s) were performed by non-physician practitioner and as supervising physician I was immediately available for consultation/collaboration.   EKG Interpretation None       Babette Relic, MD 03/30/14 2143

## 2014-03-30 NOTE — ED Notes (Signed)
PT reports a rusty fishing hook cut his finger on Sunday. Pt states he needs a tetanus shot.

## 2014-03-31 NOTE — Discharge Planning (Signed)
[facility] Liaison was not able to see patient, [facility] orange card information and resource guide will be sent to the address provided.

## 2014-06-13 ENCOUNTER — Emergency Department (HOSPITAL_COMMUNITY)
Admission: EM | Admit: 2014-06-13 | Discharge: 2014-06-13 | Disposition: A | Discharge status: Home / Self Care | Payer: Self-pay | Attending: Emergency Medicine | Admitting: Emergency Medicine

## 2014-06-13 ENCOUNTER — Encounter (HOSPITAL_COMMUNITY): Payer: Self-pay | Admitting: Emergency Medicine

## 2014-06-13 DIAGNOSIS — R21 Rash and other nonspecific skin eruption: Secondary | ICD-10-CM | POA: Insufficient documentation

## 2014-06-13 DIAGNOSIS — Z720 Tobacco use: Secondary | ICD-10-CM | POA: Insufficient documentation

## 2014-06-13 DIAGNOSIS — I10 Essential (primary) hypertension: Secondary | ICD-10-CM | POA: Insufficient documentation

## 2014-06-13 LAB — CBG MONITORING, ED: GLUCOSE-CAPILLARY: 82 mg/dL (ref 70–99)

## 2014-06-13 LAB — RPR

## 2014-06-13 LAB — HIV ANTIBODY (ROUTINE TESTING W REFLEX): HIV 1&2 Ab, 4th Generation: NONREACTIVE

## 2014-06-13 MED ORDER — CEPHALEXIN 500 MG PO CAPS: 500.0000 mg | ORAL_CAPSULE | Freq: Four times a day (QID) | ORAL | Status: DC

## 2014-06-13 MED ORDER — MICONAZOLE NITRATE 2 % EX CREA: 1.0000 "application " | TOPICAL_CREAM | Freq: Two times a day (BID) | CUTANEOUS | Status: DC

## 2014-06-13 MED ORDER — SULFAMETHOXAZOLE-TRIMETHOPRIM 800-160 MG PO TABS: 1.0000 | ORAL_TABLET | Freq: Two times a day (BID) | ORAL | Status: DC

## 2014-06-13 NOTE — ED Provider Notes (Signed)
CSN: [id number]     Arrival date & time 06/13/14  1103 History  This chart was scribed for Linus Mako, PA, working with No att. providers found found by Starleen Arms, ED Scribe. This patient was seen in room TR10C/TR10C and the patient's care was started at 12:43 PM.   Chief Complaint  Patient presents with  . Hand Problem    hand with sores and cracked skin   The history is provided by the patient. No language interpreter was used.   HPI Comments: COLBIE DANNER is a 48 y.o. male who presents to the Emergency Department complaining of bilateral rash to hands. He reports the rash progressively worsening over the past 8 months, but much faster spreading over the past couple of months. He has tried topical hydrocortisone cream ,Aquaphor, and Eucerin cream. It has not helped. He reports seeing red lesions to his hands that then drain, crack and splint causing swelling and pain to his hands. Has hx of tattoo but denies use of any other needles (IV drug use, diabetic needles, joint injections, ect.). Reports otherwise feeling as baseline. Pt smells faintly of alcohol and cigarettes.   Past Medical History  Diagnosis Date  . Hypertension    Past Surgical History  Procedure Laterality Date  . Tonsillectomy    . Fracture surgery     Family History  Problem Relation Age of Onset  . Cancer Mother   . Heart failure Mother   . Cancer Father    History  Substance Use Topics  . Smoking status: Current Every Day Smoker -- 1.50 packs/day for 25 years    Types: Cigarettes  . Smokeless tobacco: Never Used  . Alcohol Use: No     Comment: everyday    Review of Systems  All other systems reviewed and are negative.     Allergies  Review of patient's allergies indicates no known allergies.  Home Medications   Prior to Admission medications   Medication Sig Start Date End Date Taking? Authorizing Provider  Coconut Oil OIL Apply 1 application topically daily. Apply once daily to hands    Yes Historical Provider, MD  hydrogen peroxide 3 % external solution Apply 1 application topically as needed (Cleaning hands).   Yes Historical Provider, MD  mineral oil-hydrophilic petrolatum (AQUAPHOR) ointment Apply 1 application topically daily. Apply to hands once daily before dressing   Yes Historical Provider, MD  cephALEXin (KEFLEX) 500 MG capsule Take 1 capsule (500 mg total) by mouth 4 (four) times daily. 06/13/14   Lonzo Saulter Marilu Favre, PA-C  miconazole (MICOTIN) 2 % cream Apply 1 application topically 2 (two) times daily. 06/13/14   Jalayne Ganesh Marilu Favre, PA-C  sulfamethoxazole-trimethoprim (SEPTRA DS) 800-160 MG per tablet Take 1 tablet by mouth 2 (two) times daily. 06/13/14   Daltin Crist Marilu Favre, PA-C   BP 161/91  Pulse 94  Temp(Src) 97.6 F (36.4 C) (Oral)  Resp 16  Ht 6\' 2"  (1.88 m)  Wt 215 lb (97.523 kg)  BMI 27.59 kg/m2  SpO2 98% Physical Exam  Nursing note and vitals reviewed. Constitutional: He is oriented to person, place, and time. He appears well-developed and well-nourished. No distress.  HENT:  Head: Normocephalic and atraumatic.  Eyes: Conjunctivae and EOM are normal.  Neck: Neck supple. No tracheal deviation present.  Cardiovascular: Normal rate.   Pulmonary/Chest: Effort normal. No respiratory distress.  Musculoskeletal: Normal range of motion.  Neurological: He is alert and oriented to person, place, and time.  Skin: Skin is warm  and dry. Lesion and rash noted.  Bilateral flakey rash with sporadic 0.25 cm diameter red circular lsions. Approx 1-3 lesions per finger surrounding by cracked skin which he reports as painful. Fingers or mildly swollen. No erythema, induration, lacerations or gumma.   Psychiatric: He has a normal mood and affect. His behavior is normal.    ED Course  Procedures (including critical care time)  DIAGNOSTIC STUDIES: Oxygen Saturation is 98% on RA, normal by my interpretation.    COORDINATION OF CARE: Will draw HIV, RPR, CBG. Patient has  no prior records of HIV or RPR testing in the ED. Will send out. Will start him on Bactrim and Keflex and Rx metronidazole topical cream. Pt referred to Derm.   Labs Review Labs Reviewed  RPR  HIV ANTIBODY (ROUTINE TESTING)  CBG MONITORING, ED    Imaging Review No results found.   EKG Interpretation None      MDM   Final diagnoses:  Rash and nonspecific skin eruption    48 y.o.Kionte L Akter's evaluation in the Emergency Department is complete. It has been determined that no acute conditions requiring further emergency intervention are present at this time. The patient/guardian have been advised of the diagnosis and plan. We have discussed signs and symptoms that warrant return to the ED, such as changes or worsening in symptoms.  Vital signs are stable at discharge. Filed Vitals:   06/13/14 1107  BP: 161/91  Pulse: 94  Temp: 97.6 F (36.4 C)  Resp: 16    Patient/guardian has voiced understanding and agreed to follow-up with the PCP or specialist.   I personally performed the services described in this documentation, which was scribed in my presence. The recorded information has been reviewed and is accurate.    Linus Mako, PA-C 06/13/14 1244

## 2014-06-13 NOTE — ED Notes (Signed)
CBG 82 

## 2014-06-13 NOTE — ED Notes (Signed)
Declined W/C at D/C and was escorted to lobby by RN. 

## 2014-06-13 NOTE — ED Notes (Signed)
Pt. Stated, I ve had a problem with my hands for 8 months with sores and cracking .

## 2014-06-13 NOTE — ED Provider Notes (Signed)
Medical screening examination/treatment/procedure(s) were performed by non-physician practitioner and as supervising physician I was immediately available for consultation/collaboration.   EKG Interpretation None        Fredia Sorrow, MD 06/13/14 [phone]

## 2014-06-13 NOTE — Discharge Instructions (Signed)

## 2019-12-01 ENCOUNTER — Other Ambulatory Visit: Payer: Self-pay

## 2019-12-01 ENCOUNTER — Emergency Department (HOSPITAL_COMMUNITY)
Admission: EM | Admit: 2019-12-01 | Discharge: 2019-12-02 | Disposition: A | Discharge status: Home / Self Care | Payer: Self-pay | Attending: Emergency Medicine | Admitting: Emergency Medicine

## 2019-12-01 ENCOUNTER — Encounter (HOSPITAL_COMMUNITY): Payer: Self-pay

## 2019-12-01 DIAGNOSIS — I10 Essential (primary) hypertension: Secondary | ICD-10-CM | POA: Insufficient documentation

## 2019-12-01 DIAGNOSIS — F1010 Alcohol abuse, uncomplicated: Secondary | ICD-10-CM | POA: Insufficient documentation

## 2019-12-01 DIAGNOSIS — F17210 Nicotine dependence, cigarettes, uncomplicated: Secondary | ICD-10-CM | POA: Insufficient documentation

## 2019-12-01 HISTORY — DX: Alcohol abuse, uncomplicated: F10.10

## 2019-12-01 LAB — COMPREHENSIVE METABOLIC PANEL
ALT: 57 U/L — ABNORMAL HIGH (ref 0–44)
AST: 118 U/L — ABNORMAL HIGH (ref 15–41)
Albumin: 3.7 g/dL (ref 3.5–5.0)
Alkaline Phosphatase: 97 U/L (ref 38–126)
Anion gap: 10 (ref 5–15)
BUN: 7 mg/dL (ref 6–20)
CO2: 27 mmol/L (ref 22–32)
Calcium: 8.4 mg/dL — ABNORMAL LOW (ref 8.9–10.3)
Chloride: 102 mmol/L (ref 98–111)
Creatinine, Ser: 0.82 mg/dL (ref 0.61–1.24)
GFR calc Af Amer: >60 mL/min (ref >60)
GFR calc non Af Amer: >60 mL/min (ref >60)
Glucose, Bld: 88 mg/dL (ref 70–99)
Potassium: 4.4 mmol/L (ref 3.5–5.1)
Sodium: 139 mmol/L (ref 135–145)
Total Bilirubin: 1 mg/dL (ref 0.3–1.2)
Total Protein: 7 g/dL (ref 6.5–8.1)

## 2019-12-01 LAB — CBC WITH DIFFERENTIAL/PLATELET
Abs Immature Granulocytes: 0.02 10*3/uL (ref 0.00–0.07)
Basophils Absolute: 0 10*3/uL (ref 0.0–0.1)
Basophils Relative: 1 %
Eosinophils Absolute: 0.1 10*3/uL (ref 0.0–0.5)
Eosinophils Relative: 2 %
HCT: 47.5 % (ref 39.0–52.0)
Hemoglobin: 16.2 g/dL (ref 13.0–17.0)
Immature Granulocytes: 0 %
Lymphocytes Relative: 38 %
Lymphs Abs: 2.4 10*3/uL (ref 0.7–4.0)
MCH: 35.4 pg — ABNORMAL HIGH (ref 26.0–34.0)
MCHC: 34.1 g/dL (ref 30.0–36.0)
MCV: 103.7 fL — ABNORMAL HIGH (ref 80.0–100.0)
Monocytes Absolute: 0.5 10*3/uL (ref 0.1–1.0)
Monocytes Relative: 8 %
Neutro Abs: 3.4 10*3/uL (ref 1.7–7.7)
Neutrophils Relative %: 51 %
Platelets: 158 10*3/uL (ref 150–400)
RBC: 4.58 MIL/uL (ref 4.22–5.81)
RDW: 11.9 % (ref 11.5–15.5)
WBC: 6.4 10*3/uL (ref 4.0–10.5)
nRBC: 0 % (ref 0.0–0.2)

## 2019-12-01 LAB — ACETAMINOPHEN LEVEL: Acetaminophen (Tylenol), Serum: <10 ug/mL — ABNORMAL LOW (ref 10–30)

## 2019-12-01 LAB — ETHANOL: Alcohol, Ethyl (B): 230 mg/dL — ABNORMAL HIGH (ref <10)

## 2019-12-01 LAB — PROTIME-INR
INR: 1 (ref 0.8–1.2)
Prothrombin Time: 13 seconds (ref 11.4–15.2)

## 2019-12-01 LAB — LIPASE, BLOOD: Lipase: 33 U/L (ref 11–51)

## 2019-12-01 MED ORDER — THIAMINE HCL 100 MG PO TABS: 100.0000 mg | ORAL_TABLET | Freq: Every day | ORAL | Status: DC

## 2019-12-01 MED ORDER — THIAMINE HCL 100 MG/ML IJ SOLN
100.0000 mg | Freq: Every day | INTRAMUSCULAR | Status: DC
  Administered 2019-12-01: 100 mg via INTRAVENOUS
  Filled 2019-12-01: qty 2

## 2019-12-01 MED ORDER — LORAZEPAM 2 MG/ML IJ SOLN: 0.0000 mg | Freq: Two times a day (BID) | INTRAMUSCULAR | Status: DC

## 2019-12-01 MED ORDER — LORAZEPAM 1 MG PO TABS: 0.0000 mg | ORAL_TABLET | Freq: Two times a day (BID) | ORAL | Status: DC

## 2019-12-01 MED ORDER — LORAZEPAM 2 MG/ML IJ SOLN
0.0000 mg | Freq: Four times a day (QID) | INTRAMUSCULAR | Status: DC
  Administered 2019-12-01: 1 mg via INTRAVENOUS
  Filled 2019-12-01: qty 1

## 2019-12-01 MED ORDER — THIAMINE HCL 100 MG/ML IJ SOLN
Freq: Once | INTRAVENOUS | Status: AC
  Filled 2019-12-01: qty 1000

## 2019-12-01 MED ORDER — LORAZEPAM 1 MG PO TABS
0.0000 mg | ORAL_TABLET | Freq: Four times a day (QID) | ORAL | Status: DC
Start: 2019-12-01 — End: 2019-12-02

## 2019-12-01 NOTE — ED Triage Notes (Signed)
Patient states he drinks daily. Patient states he last drank 10-12 beers today and the last being 30 minutes ago. Patient states he has had intermittent blood in his urine and c/o left back pain.

## 2019-12-01 NOTE — ED Provider Notes (Signed)
[facility] DEPT Provider Note   CSN: [id number] Arrival date & time: 12/01/19  1704     History Chief Complaint  Patient presents with  . detox  . Hematuria    Arthur Mcmahon is a 54 y.o. male.  HPI Patient reports he wants to get detox from alcohol.  He drinks 12-24 beers daily.  He drank shortly prior to coming into the hospital.  He reports every time he tries to quit drinking, he gets really nauseated and starts vomiting.  He reports he tried to a couple months ago and was not successful.  He reports he lost his job about a year ago and his wife left him about 3 weeks later.  He reports this is probably contributing to his alcohol abuse.  He does not drink liquor.  He reports he used to but does not any longer.  He does sometimes smoke marijuana.  He reports he is also been having problems with blood in his urine.  Sometimes he has a pain in the left lower abdomen.  He has not had any fevers.  He does report intermittent vomiting.  He denies hallucinations.  He reports when he stops drinking he shakes a lot but has not had a seizure.  He reports he went through detox several years ago.  He has not seen a doctor in quite a few years.  He does not take any medications regularly.  Patient reports he lives with his sister and his niece.  He is currently doing handyman job for work.    Past Medical History:  Diagnosis Date  . ETOH abuse   . Hypertension     There are no problems to display for this patient.   Past Surgical History:  Procedure Laterality Date  . FRACTURE SURGERY    . TONSILLECTOMY         Family History  Problem Relation Age of Onset  . Cancer Mother   . Heart failure Mother   . Cancer Father     Social History   Tobacco Use  . Smoking status: Current Every Day Smoker    Packs/day: 1.50    Years: 25.00    Pack years: 37.50    Types: Cigarettes  . Smokeless tobacco: Never Used  Substance Use Topics  . Alcohol use:  Yes    Comment: daily  . Drug use: Yes    Types: Marijuana    Home Medications Prior to Admission medications   Medication Sig Start Date End Date Taking? Authorizing Provider  cephALEXin (KEFLEX) 500 MG capsule Take 1 capsule (500 mg total) by mouth 4 (four) times daily. 06/13/14   Delos Haring, PA-C  Coconut Oil OIL Apply 1 application topically daily. Apply once daily to hands    [provider]  hydrogen peroxide 3 % external solution Apply 1 application topically as needed (Cleaning hands).    [provider]  miconazole (MICOTIN) 2 % cream Apply 1 application topically 2 (two) times daily. 06/13/14   Delos Haring, PA-C  mineral oil-hydrophilic petrolatum (AQUAPHOR) ointment Apply 1 application topically daily. Apply to hands once daily before dressing    [provider]  sulfamethoxazole-trimethoprim (SEPTRA DS) 800-160 MG per tablet Take 1 tablet by mouth 2 (two) times daily. 06/13/14   Delos Haring, PA-C    Allergies    Patient has no known allergies.  Review of Systems   Review of Systems 10 Systems reviewed and are negative for acute change except as  noted in the HPI. Physical Exam Updated Vital Signs BP (!) 143/97   Pulse 84   Temp 97.6 F (36.4 C) (Oral)   Resp 16   Ht 6\' 2"  (1.88 m)   Wt 97.5 kg   SpO2 98%   BMI 27.60 kg/m   Physical Exam Constitutional:      Comments: Patient is alert and nontoxic.  Speech is clear.  No respiratory distress.  Patient smells of alcohol.  HENT:     Head: Normocephalic and atraumatic.     Mouth/Throat:     Mouth: Mucous membranes are moist.     Pharynx: Oropharynx is clear.  Eyes:     Extraocular Movements: Extraocular movements intact.  Cardiovascular:     Rate and Rhythm: Normal rate and regular rhythm.  Pulmonary:     Effort: Pulmonary effort is normal.     Breath sounds: Normal breath sounds.  Abdominal:     General: There is no distension.     Palpations: Abdomen is soft.      Tenderness: There is no abdominal tenderness. There is no guarding.     Comments: Positive hepatomegaly  Musculoskeletal:        General: No swelling or tenderness. Normal range of motion.     Right lower leg: No edema.     Left lower leg: No edema.  Skin:    General: Skin is warm and dry.  Neurological:     General: No focal deficit present.     Mental Status: He is oriented to person, place, and time.     Coordination: Coordination normal.  Psychiatric:        Mood and Affect: Mood normal.     ED Results / Procedures / Treatments   Labs (all labs ordered are listed, but only abnormal results are displayed) Labs Reviewed  URINALYSIS, ROUTINE W REFLEX MICROSCOPIC    EKG None  Radiology No results found.  Procedures Procedures (including critical care time)  Medications Ordered in ED Medications - No data to display  ED Course  I have reviewed the triage vital signs and the nursing notes.  Pertinent labs & imaging results that were available during my care of the patient were reviewed by me and considered in my medical decision making (see chart for details).    MDM Rules/Calculators/A&P                     Patient reports alcohol dependence and desire for detox treatment.  He has not been seeing any medical providers for a number of years.  He reports hematuria.  Renal function normal.  Patient is abdomen is soft without guarding.  Do not suspect acute surgical findings.  Patient does not have ascites or distention.  Urinalysis pending.  Specimen is clear in appearance without any rusty tint or visible blood.  Will observe patient overnight as he is acutely intoxicated.  He may be appropriate for discharge with Librium taper if patient continues to desire outpatient treatment program and detox once he is not acutely intoxicated.  Medically patient appears stable.  Awaiting UA result but urine specimen does not show gross blood or discoloration.  Dr. Tomi Bamberger to  dispostion.  Final Clinical Impression(s) / ED Diagnoses Final diagnoses:  Alcohol abuse    Rx / DC Orders ED Discharge Orders    None       Charlesetta Shanks, MD 12/06/19 [phone]

## 2019-12-02 LAB — RAPID URINE DRUG SCREEN, HOSP PERFORMED
Amphetamines: NOT DETECTED
Barbiturates: NOT DETECTED
Benzodiazepines: NOT DETECTED
Cocaine: NOT DETECTED
Opiates: NOT DETECTED
Tetrahydrocannabinol: POSITIVE — AB

## 2019-12-02 LAB — URINALYSIS, ROUTINE W REFLEX MICROSCOPIC
Bilirubin Urine: NEGATIVE
Glucose, UA: NEGATIVE mg/dL
Ketones, ur: NEGATIVE mg/dL
Leukocytes,Ua: NEGATIVE
Nitrite: NEGATIVE
Protein, ur: NEGATIVE mg/dL
Specific Gravity, Urine: 1.011 (ref 1.005–1.030)
pH: 5 (ref 5.0–8.0)

## 2019-12-02 MED ORDER — CHLORDIAZEPOXIDE HCL 25 MG PO CAPS: ORAL_CAPSULE | ORAL | 0 refills | Status: DC

## 2019-12-02 MED ORDER — PANTOPRAZOLE SODIUM 20 MG PO TBEC: 20.0000 mg | DELAYED_RELEASE_TABLET | Freq: Every day | ORAL | 0 refills | Status: DC

## 2019-12-02 MED ORDER — ONDANSETRON 4 MG PO TBDP: 4.0000 mg | ORAL_TABLET | ORAL | 0 refills | Status: DC | PRN

## 2019-12-02 NOTE — ED Provider Notes (Signed)
Patient was left at change of shift to check his urinalysis for his complaints of hematuria and to reevaluate him once his alcohol level has improved.  Patient has not been suicidal and is agreeable to outpatient detox for alcoholism.  3:00 AM patient is resting but easily awakened.  He denies any suicidal or homicidal thoughts or intent.  He states he is very agreeable to outpatient treatment for his alcoholism.  He states he feels ready to be discharged.  His alcohol was 230 five hours ago.   Results for orders placed or performed during the hospital encounter of 12/01/19  Urinalysis, Routine w reflex microscopic- may I&O cath if menses  Result Value Ref Range   Color, Urine YELLOW YELLOW   APPearance CLEAR CLEAR   Specific Gravity, Urine 1.011 1.005 - 1.030   pH 5.0 5.0 - 8.0   Glucose, UA NEGATIVE NEGATIVE mg/dL   Hgb urine dipstick MODERATE (A) NEGATIVE   Bilirubin Urine NEGATIVE NEGATIVE   Ketones, ur NEGATIVE NEGATIVE mg/dL   Protein, ur NEGATIVE NEGATIVE mg/dL   Nitrite NEGATIVE NEGATIVE   Leukocytes,Ua NEGATIVE NEGATIVE   RBC / HPF 6-10 0 - 5 RBC/hpf   WBC, UA 0-5 0 - 5 WBC/hpf   Bacteria, UA RARE (A) NONE SEEN   Mucus PRESENT   Comprehensive metabolic panel  Result Value Ref Range   Sodium 139 135 - 145 mmol/L   Potassium 4.4 3.5 - 5.1 mmol/L   Chloride 102 98 - 111 mmol/L   CO2 27 22 - 32 mmol/L   Glucose, Bld 88 70 - 99 mg/dL   BUN 7 6 - 20 mg/dL   Creatinine, Ser 0.82 0.61 - 1.24 mg/dL   Calcium 8.4 (L) 8.9 - 10.3 mg/dL   Total Protein 7.0 6.5 - 8.1 g/dL   Albumin 3.7 3.5 - 5.0 g/dL   AST 118 (H) 15 - 41 U/L   ALT 57 (H) 0 - 44 U/L   Alkaline Phosphatase 97 38 - 126 U/L   Total Bilirubin 1.0 0.3 - 1.2 mg/dL   GFR calc non Af Amer >60 >60 mL/min   GFR calc Af Amer >60 >60 mL/min   Anion gap 10 5 - 15  Ethanol  Result Value Ref Range   Alcohol, Ethyl (B) 230 (H) <10 mg/dL  Acetaminophen level  Result Value Ref Range   Acetaminophen (Tylenol), Serum <10 (L) 10  - 30 ug/mL  Lipase, blood  Result Value Ref Range   Lipase 33 11 - 51 U/L  CBC with Differential  Result Value Ref Range   WBC 6.4 4.0 - 10.5 K/uL   RBC 4.58 4.22 - 5.81 MIL/uL   Hemoglobin 16.2 13.0 - 17.0 g/dL   HCT 47.5 39.0 - 52.0 %   MCV 103.7 (H) 80.0 - 100.0 fL   MCH 35.4 (H) 26.0 - 34.0 pg   MCHC 34.1 30.0 - 36.0 g/dL   RDW 11.9 11.5 - 15.5 %   Platelets 158 150 - 400 K/uL   nRBC 0.0 0.0 - 0.2 %   Neutrophils Relative % 51 %   Neutro Abs 3.4 1.7 - 7.7 K/uL   Lymphocytes Relative 38 %   Lymphs Abs 2.4 0.7 - 4.0 K/uL   Monocytes Relative 8 %   Monocytes Absolute 0.5 0.1 - 1.0 K/uL   Eosinophils Relative 2 %   Eosinophils Absolute 0.1 0.0 - 0.5 K/uL   Basophils Relative 1 %   Basophils Absolute 0.0 0.0 - 0.1 K/uL  Immature Granulocytes 0 %   Abs Immature Granulocytes 0.02 0.00 - 0.07 K/uL  Protime-INR  Result Value Ref Range   Prothrombin Time 13.0 11.4 - 15.2 seconds   INR 1.0 0.8 - 1.2  Urine rapid drug screen (hosp performed)  Result Value Ref Range   Opiates NONE DETECTED NONE DETECTED   Cocaine NONE DETECTED NONE DETECTED   Benzodiazepines NONE DETECTED NONE DETECTED   Amphetamines NONE DETECTED NONE DETECTED   Tetrahydrocannabinol POSITIVE (A) NONE DETECTED   Barbiturates NONE DETECTED NONE DETECTED   Laboratory interpretation all normal except microscopic hematuria, minor elevation of LFTs consistent with alcoholism, alcohol intoxication.  Diagnoses that have been ruled out:  None  Diagnoses that are still under consideration:  None  Final diagnoses:  None   ED Discharge Orders         Ordered    chlordiazePOXIDE (LIBRIUM) 25 MG capsule     12/02/19 0009    pantoprazole (PROTONIX) 20 MG tablet  Daily     12/02/19 0009    ondansetron (ZOFRAN ODT) 4 MG disintegrating tablet  Every 4 hours PRN     12/02/19 0009         Plan discharge  Rolland Porter, MD, Barbette Or, MD 12/02/19 [phone]

## 2019-12-02 NOTE — Discharge Instructions (Addendum)
Get the prescriptions filled and take as directed.  You did have some blood in your urine when it was tested in the lab.  Please call [facility] and get an appointment with Dr. Claudia Desanctis, a urologist, to further evaluate the blood in your urine.  Return to the emergency room if you feel like you are going to harm yourself.

## 2019-12-02 NOTE — ED Notes (Signed)
PT A/O and ambulatory at discharge PT educated on how to take medications. PT verbalized discharge instructions.

## 2021-03-16 ENCOUNTER — Other Ambulatory Visit: Payer: Self-pay

## 2021-03-16 ENCOUNTER — Emergency Department (HOSPITAL_COMMUNITY)
Admission: EM | Admit: 2021-03-16 | Discharge: 2021-03-16 | Disposition: A | Discharge status: Home / Self Care | Payer: Self-pay | Attending: Emergency Medicine | Admitting: Emergency Medicine

## 2021-03-16 DIAGNOSIS — N3090 Cystitis, unspecified without hematuria: Secondary | ICD-10-CM | POA: Insufficient documentation

## 2021-03-16 DIAGNOSIS — N3091 Cystitis, unspecified with hematuria: Secondary | ICD-10-CM

## 2021-03-16 DIAGNOSIS — F17210 Nicotine dependence, cigarettes, uncomplicated: Secondary | ICD-10-CM | POA: Insufficient documentation

## 2021-03-16 DIAGNOSIS — R609 Edema, unspecified: Secondary | ICD-10-CM

## 2021-03-16 DIAGNOSIS — R600 Localized edema: Secondary | ICD-10-CM | POA: Insufficient documentation

## 2021-03-16 DIAGNOSIS — I10 Essential (primary) hypertension: Secondary | ICD-10-CM | POA: Insufficient documentation

## 2021-03-16 LAB — URINALYSIS, ROUTINE W REFLEX MICROSCOPIC
Bilirubin Urine: NEGATIVE
Glucose, UA: NEGATIVE mg/dL
Ketones, ur: 5 mg/dL — AB
Nitrite: NEGATIVE
Protein, ur: 100 mg/dL — AB
RBC / HPF: >50 RBC/hpf — ABNORMAL HIGH (ref 0–5)
Specific Gravity, Urine: 1.025 (ref 1.005–1.030)
WBC, UA: >50 WBC/hpf — ABNORMAL HIGH (ref 0–5)
pH: 5 (ref 5.0–8.0)

## 2021-03-16 LAB — COMPREHENSIVE METABOLIC PANEL
ALT: 14 U/L (ref 0–44)
AST: 22 U/L (ref 15–41)
Albumin: 2.8 g/dL — ABNORMAL LOW (ref 3.5–5.0)
Alkaline Phosphatase: 71 U/L (ref 38–126)
Anion gap: 7 (ref 5–15)
BUN: <5 mg/dL — ABNORMAL LOW (ref 6–20)
CO2: 24 mmol/L (ref 22–32)
Calcium: 8.6 mg/dL — ABNORMAL LOW (ref 8.9–10.3)
Chloride: 101 mmol/L (ref 98–111)
Creatinine, Ser: 0.73 mg/dL (ref 0.61–1.24)
GFR, Estimated: >60 mL/min (ref >60)
Glucose, Bld: 91 mg/dL (ref 70–99)
Potassium: 3.7 mmol/L (ref 3.5–5.1)
Sodium: 132 mmol/L — ABNORMAL LOW (ref 135–145)
Total Bilirubin: 0.7 mg/dL (ref 0.3–1.2)
Total Protein: 6.9 g/dL (ref 6.5–8.1)

## 2021-03-16 LAB — CBC WITH DIFFERENTIAL/PLATELET
Abs Immature Granulocytes: 0.05 10*3/uL (ref 0.00–0.07)
Basophils Absolute: 0 10*3/uL (ref 0.0–0.1)
Basophils Relative: 0 %
Eosinophils Absolute: 0.1 10*3/uL (ref 0.0–0.5)
Eosinophils Relative: 2 %
HCT: 46.2 % (ref 39.0–52.0)
Hemoglobin: 15.7 g/dL (ref 13.0–17.0)
Immature Granulocytes: 1 %
Lymphocytes Relative: 22 %
Lymphs Abs: 1.4 10*3/uL (ref 0.7–4.0)
MCH: 35.1 pg — ABNORMAL HIGH (ref 26.0–34.0)
MCHC: 34 g/dL (ref 30.0–36.0)
MCV: 103.4 fL — ABNORMAL HIGH (ref 80.0–100.0)
Monocytes Absolute: 0.5 10*3/uL (ref 0.1–1.0)
Monocytes Relative: 8 %
Neutro Abs: 4.2 10*3/uL (ref 1.7–7.7)
Neutrophils Relative %: 67 %
Platelets: 253 10*3/uL (ref 150–400)
RBC: 4.47 MIL/uL (ref 4.22–5.81)
RDW: 11.9 % (ref 11.5–15.5)
WBC: 6.3 10*3/uL (ref 4.0–10.5)
nRBC: 0 % (ref 0.0–0.2)

## 2021-03-16 MED ORDER — CEPHALEXIN 500 MG PO CAPS: 500.0000 mg | ORAL_CAPSULE | Freq: Two times a day (BID) | ORAL | 0 refills | Status: AC

## 2021-03-16 NOTE — Discharge Instructions (Addendum)
Overall suspect that you have a urine infection.  However given blood in your urine and smoking history it is important to follow-up with urology to further rule out a cancerous process.  [facility] with [facility] to [facility] primary care, there is no insurance needed to follow with this program.  Swelling in the legs is likely from poor nutrition.  Suspect that this will get better with time.

## 2021-03-16 NOTE — ED Notes (Signed)
Pt verbalizes understanding of discharge instructions. Opportunity for questions and answers were provided. Pt discharged from the ED.   ?

## 2021-03-16 NOTE — ED Triage Notes (Addendum)
Pt c/o bilateral feet swelling onset 1-2 weeks, worse swelling left leg. Painful with ambulation. Denies CP/SOB. Denies CHF DX. Denies recent weight gain. States recently stopped drinking about 3 weeks ago.    Also has experienced bloody urine with clots over the past year. Noticed an increase in clots. With intermittent painful urination. C/o of feeling like he cant fully empty his bladder. Has been referred to urology in the past unable to follow up. Denies abdominal pain

## 2021-03-16 NOTE — ED Notes (Signed)
Pt standing outside.

## 2021-03-16 NOTE — ED Notes (Signed)
Lab to add on urine culture

## 2021-03-16 NOTE — ED Provider Notes (Signed)
Emergency Medicine Provider Triage Evaluation Note  Arthur Mcmahon , a 55 y.o. male  was evaluated in triage.  Pt complains of leg swelling x 1.5 weeks.  Denies CP or SOB.  Denies any recent illnesses.  Denies cough or fever.  States that he also has frequent urination with blood in his urine.  Review of Systems  Positive: Leg swelling, hematuria Negative: Cp, SOB  Physical Exam  Ht 6\' 2"  (1.88 m)   Wt 97.5 kg   BMI 27.60 kg/m  Gen:   Awake, no distress   Resp:  Normal effort  MSK:   Moves extremities without difficulty  Other:  1+ pitting edema bilaterally  Medical Decision Making  Medically screening exam initiated at 6:30 AM.  Appropriate orders placed.  Arthur Mcmahon was informed that the remainder of the evaluation will be completed by another provider, this initial triage assessment does not replace that evaluation, and the importance of remaining in the ED until their evaluation is complete.  Leg swelling hematuria   Montine Circle, PA-C 20/81/38 8719    Delora Fuel, MD 59/74/71 [phone]

## 2021-03-16 NOTE — ED Provider Notes (Signed)
[name] EMERGENCY DEPARTMENT Provider Note   CSN: [id number] Arrival date & time: 03/16/21  5809     History Chief Complaint  Patient presents with   Leg Swelling   Hematuria    Arthur Mcmahon is a 55 y.o. male.  Has noticed some blood in his urine last couple days.  History of the same.  States he was does see urology in the past for the same.  Not having any urinary retention.  History of alcohol abuse and recently quit.  Has noticed some swelling to his feet which she states happened last time he stopped drinking as well.  Denies any shortness of breath, chest pain.  No fever, no chills.  Denies any injury or trauma.  The history is provided by the patient.  Hematuria This is a recurrent problem. The problem has not changed since onset.Pertinent negatives include no chest pain, no abdominal pain, no headaches and no shortness of breath. Nothing aggravates the symptoms. Nothing relieves the symptoms. He has tried nothing for the symptoms. The treatment provided no relief.      Past Medical History:  Diagnosis Date   ETOH abuse    Hypertension     There are no problems to display for this patient.   Past Surgical History:  Procedure Laterality Date   FRACTURE SURGERY     TONSILLECTOMY         Family History  Problem Relation Age of Onset   Cancer Mother    Heart failure Mother    Cancer Father     Social History   Tobacco Use   Smoking status: Every Day    Packs/day: 1.50    Years: 25.00    Pack years: 37.50    Types: Cigarettes   Smokeless tobacco: Never  Vaping Use   Vaping Use: Never used  Substance Use Topics   Alcohol use: Yes    Comment: daily   Drug use: Yes    Types: Marijuana    Home Medications Prior to Admission medications   Medication Sig Start Date End Date Taking? Authorizing Provider  cephALEXin (KEFLEX) 500 MG capsule Take 1 capsule (500 mg total) by mouth 2 (two) times daily for 5 days. 03/16/21 03/21/21 Yes  Terriyah Westra, DO  chlordiazePOXIDE (LIBRIUM) 25 MG capsule 50mg  PO TID x 1D, then 25-50mg  PO BID X 1D, then 25-50mg  PO QD X 1D 12/02/19   Charlesetta Shanks, MD  miconazole (MICOTIN) 2 % cream Apply 1 application topically 2 (two) times daily. Patient not taking: Reported on 12/01/2019 06/13/14   Delos Haring, PA-C  ondansetron (ZOFRAN ODT) 4 MG disintegrating tablet Take 1 tablet (4 mg total) by mouth every 4 (four) hours as needed for nausea or vomiting. 12/02/19   Charlesetta Shanks, MD  pantoprazole (PROTONIX) 20 MG tablet Take 1 tablet (20 mg total) by mouth daily. 12/02/19   Charlesetta Shanks, MD  sulfamethoxazole-trimethoprim (SEPTRA DS) 800-160 MG per tablet Take 1 tablet by mouth 2 (two) times daily. Patient not taking: Reported on 12/01/2019 06/13/14   Delos Haring, PA-C    Allergies    Patient has no known allergies.  Review of Systems   Review of Systems  Constitutional:  Negative for chills and fever.  HENT:  Negative for ear pain and sore throat.   Eyes:  Negative for pain and visual disturbance.  Respiratory:  Negative for cough and shortness of breath.   Cardiovascular:  Positive for leg swelling. Negative for chest pain and palpitations.  Gastrointestinal:  Negative for abdominal pain, nausea and vomiting.  Genitourinary:  Positive for hematuria. Negative for decreased urine volume, difficulty urinating, dysuria, frequency, penile pain, testicular pain and urgency.  Musculoskeletal:  Negative for arthralgias and back pain.  Skin:  Negative for color change and rash.  Neurological:  Negative for seizures, syncope and headaches.  All other systems reviewed and are negative.  Physical Exam Updated Vital Signs BP (!) 155/86 (BP Location: Left Arm)   Pulse 83   Temp 98.2 F (36.8 C)   Resp 16   Ht 6\' 2"  (1.88 m)   Wt 97.5 kg   SpO2 100%   BMI 27.60 kg/m   Physical Exam Vitals and nursing note reviewed.  Constitutional:      General: He is not in acute distress.     Appearance: He is well-developed. He is not ill-appearing.  HENT:     Head: Normocephalic and atraumatic.     Nose: Nose normal.  Eyes:     Extraocular Movements: Extraocular movements intact.     Conjunctiva/sclera: Conjunctivae normal.     Pupils: Pupils are equal, round, and reactive to light.  Cardiovascular:     Rate and Rhythm: Normal rate and regular rhythm.     Pulses: Normal pulses.     Heart sounds: Normal heart sounds. No murmur heard. Pulmonary:     Effort: Pulmonary effort is normal. No respiratory distress.     Breath sounds: Normal breath sounds.  Abdominal:     General: Abdomen is flat. There is no distension.     Palpations: Abdomen is soft.     Tenderness: There is no abdominal tenderness. There is no right CVA tenderness or left CVA tenderness.  Musculoskeletal:     Cervical back: Neck supple.     Comments: 1+ pitting edema to the dorsum of both feet, there is no swelling above the ankles bilaterally, no calf tenderness bilaterally  Skin:    General: Skin is warm and dry.     Capillary Refill: Capillary refill takes less than 2 seconds.  Neurological:     General: No focal deficit present.     Mental Status: He is alert.    ED Results / Procedures / Treatments   Labs (all labs ordered are listed, but only abnormal results are displayed) Labs Reviewed  CBC WITH DIFFERENTIAL/PLATELET - Abnormal; Notable for the following components:      Result Value   MCV 103.4 (*)    MCH 35.1 (*)    All other components within normal limits  COMPREHENSIVE METABOLIC PANEL - Abnormal; Notable for the following components:   Sodium 132 (*)    BUN <5 (*)    Calcium 8.6 (*)    Albumin 2.8 (*)    All other components within normal limits  URINALYSIS, ROUTINE W REFLEX MICROSCOPIC - Abnormal; Notable for the following components:   Color, Urine AMBER (*)    APPearance CLOUDY (*)    Hgb urine dipstick LARGE (*)    Ketones, ur 5 (*)    Protein, ur 100 (*)    Leukocytes,Ua  MODERATE (*)    RBC / HPF >50 (*)    WBC, UA >50 (*)    Bacteria, UA FEW (*)    All other components within normal limits  URINE CULTURE    EKG None  Radiology No results found.  Procedures Procedures   Medications Ordered in ED Medications - No data to display  ED Course  I have reviewed  the triage vital signs and the nursing notes.  Pertinent labs & imaging results that were available during my care of the patient were reviewed by me and considered in my medical decision making (see chart for details).    MDM Rules/Calculators/A&P                          EISSA BUCHBERGER is here with blood in his urine.  Foot swelling bilaterally.  Normal vitals.  No fever.  Swelling focally just to his feet.  No infectious process.  There is no swelling above the ankles.  No calf pain.  Doubt DVT.  Swelling is symmetric.  Overall appears to be likely related to venous stasis.  Neurovascular neuromuscular intact otherwise.  Patient does have low albumin and states that he recently quit drinking several weeks ago.  Suspect likely contributing.  He is not having any respiratory symptoms, abdominal pain.  He has no CVA tenderness and no history of kidney stones.  He has had hematuria in the urine in the past and was supposed to follow-up with urology but has not.  He is a smoker.  Overall painless hematuria.  Could be infectious process and will treat with antibiotics.  Recommend follow-up with urology as this could be a cancerous process.  Given information to follow-up at wellness center to establish primary care.  Discharged in good condition.  No concern for blood clot or urinary retention.  This chart was dictated using voice recognition software.  Despite best efforts to proofread,  errors can occur which can change the documentation meaning.   Final Clinical Impression(s) / ED Diagnoses Final diagnoses:  Peripheral edema  Hemorrhagic cystitis    Rx / DC Orders ED Discharge Orders           Ordered    cephALEXin (KEFLEX) 500 MG capsule  2 times daily        03/16/21 [place], [place], DO 03/16/21 [phone]

## 2021-03-18 LAB — URINE CULTURE: Culture: 20000 — AB

## 2021-03-20 ENCOUNTER — Telehealth: Payer: Self-pay | Admitting: Emergency Medicine

## 2021-03-20 NOTE — Telephone Encounter (Signed)
Post ED Visit - Positive Culture Follow-up  Culture report reviewed by antimicrobial stewardship pharmacist: [facility] Team []  Elenor Quinones, Pharm.D. []  Heide Guile, Pharm.D., BCPS AQ-ID []  Parks Neptune, Pharm.D., BCPS []  Alycia Rossetti, Pharm.D., BCPS []  [place], Pharm.D., BCPS, AAHIVP []  Legrand Como, Pharm.D., BCPS, AAHIVP []  Salome Arnt, PharmD, BCPS []  Johnnette Gourd, PharmD, BCPS []  Hughes Better, PharmD, BCPS [x]  Lorelei Pont, PharmD []  Laqueta Linden, PharmD, BCPS []  Albertina Parr, PharmD  [facility] Team []  Leodis Sias, PharmD []  Lindell Spar, PharmD []  Royetta Asal, PharmD []  Graylin Shiver, Rph []  Rema Fendt) Glennon Mac, PharmD []  Arlyn Dunning, PharmD []  Netta Cedars, PharmD []  Dia Sitter, PharmD []  Leone Haven, PharmD []  Gretta Arab, PharmD []  Theodis Shove, PharmD []  Peggyann Juba, PharmD []  Reuel Boom, PharmD   Positive urine culture Treated with Cephalexin, organism sensitive to the same and no further patient follow-up is required at this time.  Sandi Raveling Cylus [place] 03/20/2021, 5:19 PM

## 2021-03-21 ENCOUNTER — Other Ambulatory Visit: Payer: Self-pay

## 2021-03-21 DIAGNOSIS — R609 Edema, unspecified: Secondary | ICD-10-CM | POA: Insufficient documentation

## 2021-03-21 DIAGNOSIS — L819 Disorder of pigmentation, unspecified: Secondary | ICD-10-CM | POA: Insufficient documentation

## 2021-03-21 DIAGNOSIS — N390 Urinary tract infection, site not specified: Secondary | ICD-10-CM | POA: Insufficient documentation

## 2021-03-21 DIAGNOSIS — F17210 Nicotine dependence, cigarettes, uncomplicated: Secondary | ICD-10-CM | POA: Insufficient documentation

## 2021-03-21 DIAGNOSIS — I10 Essential (primary) hypertension: Secondary | ICD-10-CM | POA: Insufficient documentation

## 2021-03-22 ENCOUNTER — Emergency Department (HOSPITAL_BASED_OUTPATIENT_CLINIC_OR_DEPARTMENT_OTHER): Payer: Self-pay

## 2021-03-22 ENCOUNTER — Other Ambulatory Visit: Payer: Self-pay

## 2021-03-22 ENCOUNTER — Emergency Department (HOSPITAL_COMMUNITY)
Admission: EM | Admit: 2021-03-22 | Discharge: 2021-03-22 | Disposition: A | Discharge status: Home / Self Care | Payer: Self-pay | Attending: Emergency Medicine | Admitting: Emergency Medicine

## 2021-03-22 ENCOUNTER — Emergency Department (HOSPITAL_COMMUNITY): Payer: Self-pay

## 2021-03-22 ENCOUNTER — Encounter (HOSPITAL_COMMUNITY): Payer: Self-pay | Admitting: Emergency Medicine

## 2021-03-22 DIAGNOSIS — L819 Disorder of pigmentation, unspecified: Secondary | ICD-10-CM

## 2021-03-22 DIAGNOSIS — M79671 Pain in right foot: Secondary | ICD-10-CM

## 2021-03-22 DIAGNOSIS — M79672 Pain in left foot: Secondary | ICD-10-CM

## 2021-03-22 DIAGNOSIS — R600 Localized edema: Secondary | ICD-10-CM

## 2021-03-22 LAB — CBC WITH DIFFERENTIAL/PLATELET
Abs Immature Granulocytes: 0.08 10*3/uL — ABNORMAL HIGH (ref 0.00–0.07)
Basophils Absolute: 0 10*3/uL (ref 0.0–0.1)
Basophils Relative: 0 %
Eosinophils Absolute: 0.1 10*3/uL (ref 0.0–0.5)
Eosinophils Relative: 1 %
HCT: 46.3 % (ref 39.0–52.0)
Hemoglobin: 15.4 g/dL (ref 13.0–17.0)
Immature Granulocytes: 1 %
Lymphocytes Relative: 19 %
Lymphs Abs: 1.7 10*3/uL (ref 0.7–4.0)
MCH: 34.2 pg — ABNORMAL HIGH (ref 26.0–34.0)
MCHC: 33.3 g/dL (ref 30.0–36.0)
MCV: 102.9 fL — ABNORMAL HIGH (ref 80.0–100.0)
Monocytes Absolute: 0.4 10*3/uL (ref 0.1–1.0)
Monocytes Relative: 5 %
Neutro Abs: 6.6 10*3/uL (ref 1.7–7.7)
Neutrophils Relative %: 74 %
Platelets: 261 10*3/uL (ref 150–400)
RBC: 4.5 MIL/uL (ref 4.22–5.81)
RDW: 11.9 % (ref 11.5–15.5)
WBC: 9 10*3/uL (ref 4.0–10.5)
nRBC: 0 % (ref 0.0–0.2)

## 2021-03-22 LAB — COMPREHENSIVE METABOLIC PANEL
ALT: 10 U/L (ref 0–44)
AST: 17 U/L (ref 15–41)
Albumin: 3 g/dL — ABNORMAL LOW (ref 3.5–5.0)
Alkaline Phosphatase: 76 U/L (ref 38–126)
Anion gap: 6 (ref 5–15)
BUN: <5 mg/dL — ABNORMAL LOW (ref 6–20)
CO2: 28 mmol/L (ref 22–32)
Calcium: 8.8 mg/dL — ABNORMAL LOW (ref 8.9–10.3)
Chloride: 95 mmol/L — ABNORMAL LOW (ref 98–111)
Creatinine, Ser: 0.75 mg/dL (ref 0.61–1.24)
GFR, Estimated: >60 mL/min (ref >60)
Glucose, Bld: 95 mg/dL (ref 70–99)
Potassium: 4.2 mmol/L (ref 3.5–5.1)
Sodium: 129 mmol/L — ABNORMAL LOW (ref 135–145)
Total Bilirubin: 0.6 mg/dL (ref 0.3–1.2)
Total Protein: 7.3 g/dL (ref 6.5–8.1)

## 2021-03-22 LAB — URINALYSIS, ROUTINE W REFLEX MICROSCOPIC
Bilirubin Urine: NEGATIVE
Glucose, UA: NEGATIVE mg/dL
Ketones, ur: NEGATIVE mg/dL
Nitrite: NEGATIVE
Protein, ur: NEGATIVE mg/dL
RBC / HPF: >50 RBC/hpf — ABNORMAL HIGH (ref 0–5)
Specific Gravity, Urine: 1.012 (ref 1.005–1.030)
pH: 6 (ref 5.0–8.0)

## 2021-03-22 LAB — APTT: aPTT: 29 seconds (ref 24–36)

## 2021-03-22 LAB — PROTIME-INR
INR: 0.9 (ref 0.8–1.2)
Prothrombin Time: 12.5 seconds (ref 11.4–15.2)

## 2021-03-22 LAB — LACTIC ACID, PLASMA: Lactic Acid, Venous: 1.3 mmol/L (ref 0.5–1.9)

## 2021-03-22 MED ORDER — OXYCODONE-ACETAMINOPHEN 5-325 MG PO TABS: 1.0000 | ORAL_TABLET | Freq: Four times a day (QID) | ORAL | 0 refills | Status: DC | PRN

## 2021-03-22 MED ORDER — OXYCODONE-ACETAMINOPHEN 5-325 MG PO TABS
2.0000 | ORAL_TABLET | Freq: Once | ORAL | Status: AC
  Administered 2021-03-22: 2 via ORAL
  Filled 2021-03-22: qty 2

## 2021-03-22 NOTE — ED Notes (Signed)
Patient left in a  Car that pulled up front

## 2021-03-22 NOTE — ED Notes (Signed)
Reviewed discharge instructions with patient. Follow-up care and medications reviewed. Patient  verbalized understanding. Patient A&Ox4, VSS, and ambulatory with steady gait upon discharge.  °

## 2021-03-22 NOTE — Progress Notes (Signed)
ABI's have been completed. Preliminary results can be found in CV Proc through chart review.   03/22/21 3:06 PM Arthur Mcmahon RVT

## 2021-03-22 NOTE — Discharge Instructions (Signed)
Please read and follow all provided instructions.  Your diagnoses today include:  1. Pain in both feet   2. Pedal edema   3. Discoloration of skin of toe     Tests performed today include: Blood cell counts (white, red, and platelets) Electrolytes  Kidney function test Urine test to check for infection Vital signs. See below for your results today.   Medications prescribed:  Percocet (oxycodone/acetaminophen) - narcotic pain medication  DO NOT drive or perform any activities that require you to be awake and alert because this medicine can make you drowsy. BE VERY CAREFUL not to take multiple medicines containing Tylenol (also called acetaminophen). Doing so can lead to an overdose which can damage your liver and cause liver failure and possibly death.  Take any prescribed medications only as directed.  Home care instructions:  Follow any educational materials contained in this packet.  BE VERY CAREFUL not to take multiple medicines containing Tylenol (also called acetaminophen). Doing so can lead to an overdose which can damage your liver and cause liver failure and possibly death.   Follow-up instructions: Call Dr. Nicole Cella office tomorrow for an appointment.  Tell them you were seen in the emergency department and there is concern for blocked blood vessels in your legs.  You should also follow-up with primary care this week as this is very important.  Return instructions:  Please return to the Emergency Department if you experience worsening symptoms.  Please return if you have worsening pain, redness in your feet which spreads up your legs. Please return if you have any other emergent concerns.  Additional Information:  Your vital signs today were: BP (!) 138/91 (BP Location: Right Arm)   Pulse 63   Temp 98 F (36.7 C) (Oral)   Resp 16   SpO2 100%  If your blood pressure (BP) was elevated above 135/85 this visit, please have this repeated by your doctor within one  month. --------------

## 2021-03-22 NOTE — ED Provider Notes (Signed)
Emergency Medicine Provider Triage Evaluation Note  Arthur Mcmahon , a 55 y.o. male  was evaluated in triage.  Pt complains of bilateral pain.  Color change.  History of diabetes.  Reports he was recently treated for urinary tract infection findings was not.  Chest pain or shortness of breath.  No fevers.  Review of Systems  Positive: Bilateral peripheral edema, acute infection of the left toes Negative: Fever, chills  Physical Exam  BP (!) 165/97 (BP Location: Left Arm)   Pulse 90   Temp 98.2 F (36.8 C)   Resp 16   SpO2 100%  Gen:   Awake, no distress   Resp:  Normal effort  MSK:   Moves extremities without difficulty left foot with discoloration of the toes, erythema and tenderness to palpation.  No crepitus. Other:  Alert and oriented  Medical Decision Making  Medically screening exam initiated at 12:32 AM.  Appropriate orders placed.  Arthur Mcmahon was informed that the remainder of the evaluation will be completed by another provider, this initial triage assessment does not replace that evaluation, and the importance of remaining in the ED until their evaluation is complete.  Concern for gangrene.  Sepsis screen initiated. Pain medication given.    Augustin Bun, Gwenlyn Perking 03/22/21 0034    Mesner, Corene Cornea, MD 03/22/21 0530

## 2021-03-22 NOTE — ED Notes (Signed)
Pt. Left againx2

## 2021-03-22 NOTE — ED Triage Notes (Signed)
Patient reports worsening pain with swelling at left foot/lower leg  and toes for 2 weeks , denies injury , no fever or chills .

## 2021-03-22 NOTE — ED Provider Notes (Signed)
[facility] EMERGENCY DEPARTMENT Provider Note   CSN: [id number] Arrival date & time: 03/21/21  2339     History Chief Complaint  Patient presents with   Foot Swelling    Arthur Mcmahon is a 55 y.o. male.  Patient with history of tobacco abuse, alcohol abuse, recently quit --presents the emergency department today for evaluation of bilateral foot swelling.  Patient also complains of pain in his bilateral feet below his toes.  Patient was seen in the emergency department for this and for blood in urine on 03/16/2021.  He was treated for UTI with Keflex.  Cultures grew out staph epidermis and staph anginosis.  Patient has noted increasing pain and also now with discoloration of the second toe on the left foot.  No fevers or chills.  No swelling elsewhere.  He denies injury.  Pain is worse with palpation.  Swelling extends up to the ankles.  He does report pain with ambulation at times.  He states that sometimes he goes shopping and has worsening pain in his feet and calves but he just tries to push through it.  He states that the pain is worse at night when he has his feet propped up.      Past Medical History:  Diagnosis Date   ETOH abuse    Hypertension     There are no problems to display for this patient.   Past Surgical History:  Procedure Laterality Date   FRACTURE SURGERY     TONSILLECTOMY         Family History  Problem Relation Age of Onset   Cancer Mother    Heart failure Mother    Cancer Father     Social History   Tobacco Use   Smoking status: Every Day    Packs/day: 1.50    Years: 25.00    Pack years: 37.50    Types: Cigarettes   Smokeless tobacco: Never  Vaping Use   Vaping Use: Never used  Substance Use Topics   Alcohol use: Yes    Comment: daily   Drug use: Yes    Types: Marijuana    Home Medications Prior to Admission medications   Medication Sig Start Date End Date Taking? Authorizing Provider  chlordiazePOXIDE  (LIBRIUM) 25 MG capsule 50mg  PO TID x 1D, then 25-50mg  PO BID X 1D, then 25-50mg  PO QD X 1D 12/02/19   Charlesetta Shanks, MD  miconazole (MICOTIN) 2 % cream Apply 1 application topically 2 (two) times daily. Patient not taking: Reported on 12/01/2019 06/13/14   Delos Haring, PA-C  ondansetron (ZOFRAN ODT) 4 MG disintegrating tablet Take 1 tablet (4 mg total) by mouth every 4 (four) hours as needed for nausea or vomiting. 12/02/19   Charlesetta Shanks, MD  pantoprazole (PROTONIX) 20 MG tablet Take 1 tablet (20 mg total) by mouth daily. 12/02/19   Charlesetta Shanks, MD  sulfamethoxazole-trimethoprim (SEPTRA DS) 800-160 MG per tablet Take 1 tablet by mouth 2 (two) times daily. Patient not taking: Reported on 12/01/2019 06/13/14   Delos Haring, PA-C    Allergies    Patient has no known allergies.  Review of Systems   Review of Systems  Constitutional:  Negative for fever.  HENT:  Negative for rhinorrhea and sore throat.   Eyes:  Negative for redness.  Respiratory:  Negative for cough.   Cardiovascular:  Positive for leg swelling. Negative for chest pain.  Gastrointestinal:  Negative for abdominal pain, diarrhea, nausea and vomiting.  Genitourinary:  Negative  for dysuria and hematuria.  Musculoskeletal:  Negative for myalgias.  Skin:  Positive for color change. Negative for rash.  Neurological:  Negative for headaches.   Physical Exam Updated Vital Signs BP 139/87 (BP Location: Left Arm)   Pulse 74   Temp 98.2 F (36.8 C) (Oral)   Resp 16   SpO2 100%   Physical Exam Vitals and nursing note reviewed.  Constitutional:      General: He is not in acute distress.    Appearance: He is well-developed.  HENT:     Head: Normocephalic and atraumatic.  Eyes:     General:        Right eye: No discharge.        Left eye: No discharge.     Conjunctiva/sclera: Conjunctivae normal.  Cardiovascular:     Rate and Rhythm: Normal rate and regular rhythm.     Pulses:          Dorsalis pedis pulses  are 0 on the right side and detected w/ Doppler on the left side.       Posterior tibial pulses are detected w/ Doppler on the right side and detected w/ Doppler on the left side.     Heart sounds: Normal heart sounds.  Pulmonary:     Effort: Pulmonary effort is normal.     Breath sounds: Normal breath sounds.  Abdominal:     Palpations: Abdomen is soft.     Tenderness: There is no abdominal tenderness.  Musculoskeletal:     Cervical back: Normal range of motion and neck supple.     Right lower leg: Edema present.     Left lower leg: Edema present.     Comments: Patient with bilateral symmetric pedal edema, up to 2+ pitting, to the distal ankles.  There is slight violaceous discoloration of the tip of the second toe on the left foot.  Patient with pain at the base of the toes on bilateral feet.  Winces with movement of the left toe.  Skin:    General: Skin is warm and dry.  Neurological:     Mental Status: He is alert.    ED Results / Procedures / Treatments   Labs (all labs ordered are listed, but only abnormal results are displayed) Labs Reviewed  COMPREHENSIVE METABOLIC PANEL - Abnormal; Notable for the following components:      Result Value   Sodium 129 (*)    Chloride 95 (*)    BUN <5 (*)    Calcium 8.8 (*)    Albumin 3.0 (*)    All other components within normal limits  CBC WITH DIFFERENTIAL/PLATELET - Abnormal; Notable for the following components:   MCV 102.9 (*)    MCH 34.2 (*)    Abs Immature Granulocytes 0.08 (*)    All other components within normal limits  URINALYSIS, ROUTINE W REFLEX MICROSCOPIC - Abnormal; Notable for the following components:   APPearance HAZY (*)    Hgb urine dipstick LARGE (*)    Leukocytes,Ua SMALL (*)    RBC / HPF >50 (*)    Bacteria, UA FEW (*)    All other components within normal limits  URINE CULTURE  CULTURE, BLOOD (ROUTINE X 2)  CULTURE, BLOOD (ROUTINE X 2)  LACTIC ACID, PLASMA  PROTIME-INR  APTT     EKG None  Radiology DG Chest Port 1 View  Result Date: 03/22/2021 CLINICAL DATA:  Possible sepsis, left foot pain/swelling EXAM: PORTABLE CHEST 1 VIEW COMPARISON:  None. FINDINGS:  Lungs are clear.  No pleural effusion or pneumothorax. The heart is normal in size. IMPRESSION: No evidence of acute cardiopulmonary disease. Electronically Signed   By: Julian Hy M.D.   On: 03/22/2021 01:11   DG Foot 2 Views Left  Result Date: 03/22/2021 CLINICAL DATA:  Left foot pain/swelling EXAM: LEFT FOOT - 2 VIEW COMPARISON:  None. FINDINGS: No fracture or dislocation is seen. The joint spaces are preserved. Moderate soft tissue swelling along the dorsal forefoot. IMPRESSION: Moderate soft tissue swelling along the dorsal forefoot. No fracture or dislocation is seen. Electronically Signed   By: Julian Hy M.D.   On: 03/22/2021 01:12   VAS [place] ABI WITH/WO TBI  Result Date: 03/22/2021  LOWER EXTREMITY DOPPLER STUDY Patient Name:  Arthur Mcmahon  Date of Exam:   03/22/2021 Medical Rec #: [id number]       Accession #:    [id number] Date of Birth: [birth year]       Patient Gender: M Patient Age:   25Y Exam Location:  [facility] Procedure:      VAS [place] ABI WITH/WO TBI Referring Phys: [id number] Nafeesah Lapaglia --------------------------------------------------------------------------------  Indications: Rest pain, and Left blue toe. High Risk Factors: Hypertension.  Comparison Study: No prior studies. Performing Technologist: Carlos Levering RVT  Examination Guidelines: A complete evaluation includes at minimum, Doppler waveform signals and systolic blood pressure reading at the level of bilateral brachial, anterior tibial, and posterior tibial arteries, when vessel segments are accessible. Bilateral testing is considered an integral part of a complete examination. Photoelectric Plethysmograph (PPG) waveforms and toe systolic pressure readings are included as required and additional duplex testing as needed.  Limited examinations for reoccurring indications may be performed as noted.  ABI Findings: +---------+------------------+-----+----------+--------+ Right    Rt Pressure (mmHg)IndexWaveform  Comment  +---------+------------------+-----+----------+--------+ Brachial 174                    triphasic          +---------+------------------+-----+----------+--------+ PTA      91                0.52 monophasic         +---------+------------------+-----+----------+--------+ DP       77                0.44 monophasic         +---------+------------------+-----+----------+--------+ Great Toe61                0.35                    +---------+------------------+-----+----------+--------+ +---------+------------------+-----+---------+-------+ Left     Lt Pressure (mmHg)IndexWaveform Comment +---------+------------------+-----+---------+-------+ Brachial 166                    triphasic        +---------+------------------+-----+---------+-------+ PTA      156               0.90 biphasic         +---------+------------------+-----+---------+-------+ DP       144               0.83 biphasic         +---------+------------------+-----+---------+-------+ Great Toe59                0.34                  +---------+------------------+-----+---------+-------+ +-------+-----------+-----------+------------+------------+ ABI/TBIToday's ABIToday's TBIPrevious ABIPrevious TBI +-------+-----------+-----------+------------+------------+ Right  0.52  0.35                                +-------+-----------+-----------+------------+------------+ Left   0.9        0.34                                +-------+-----------+-----------+------------+------------+  Summary: Right: Resting right ankle-brachial index indicates moderate right lower extremity arterial disease. The right toe-brachial index is abnormal. Left: Resting left ankle-brachial index indicates  mild left lower extremity arterial disease. The left toe-brachial index is abnormal.  *See table(s) above for measurements and observations.     Preliminary     Procedures Procedures   Medications Ordered in ED Medications  oxyCODONE-acetaminophen (PERCOCET/ROXICET) 5-325 MG per tablet 2 tablet (2 tablets Oral Given 03/22/21 0042)    ED Course  I have reviewed the triage vital signs and the nursing notes.  Pertinent labs & imaging results that were available during my care of the patient were reviewed by me and considered in my medical decision making (see chart for details).  Patient seen and examined. Work-up initiated. Doppler used to evaluate pedal pulses. Discussed with Dr. Regenia Skeeter. Will obtain for ABIs.   Vital signs reviewed and are as follows: BP 139/87 (BP Location: Left Arm)   Pulse 74   Temp 98.2 F (36.8 C) (Oral)   Resp 16   SpO2 100%   3:35 PM ABI's as noted. Discussed with Dr. Regenia Skeeter. Will speak with vascular for reccs.   4:20 PM Discussed Dr. Scot Dock.  Reviewed case and findings today.  Patient can likely follow-up next week given subacute nature of his symptoms and changes of these feet.  I am in agreement with this.  States that patient will likely need angiogram at some point in the near future.  Encouraged return to the emergency department with worsening control pain, worsening redness or discoloration, especially if it starts to spread up the foot and ankle.  Also encouraged return with worsening.  I relayed this discussion to the patient.  We will discharge home with a small amount of pain medication.  He does state that he has upcoming PCP appointment to establish care at family practice in a few days.  I strongly encouraged him to keep this.  We discussed need to call vascular surgery office tomorrow to schedule an appointment.  He verbalizes understanding agrees with plan.  Patient counseled on use of narcotic pain medications. Counseled not to combine  these medications with others containing tylenol. Urged not to drink alcohol, drive, or perform any other activities that requires focus while taking these medications. The patient verbalizes understanding and agrees with the plan.  Patient urged to return with worsening symptoms or other concerns. Patient verbalized understanding and agrees with plan.       MDM Rules/Calculators/A&P                          Patient with signs and symptoms concerning for worsening claudication.  Symptoms are subacute.  ABIs performed today as above.  Patient does have dopplerable pulses in both feet.  Slight dislocation of the left second toe which is mild at this point.  Patient does have appointment to establish care with PCP upcoming in a couple days.  Discussed case with vascular surgery today.  Feel that patient can follow-up next  week and I think that this is reasonable.  In the meantime he will take pain medication as needed for pain control.  Strict return factors above.  Intraoperative paracentesis course.     Final Clinical Impression(s) / ED Diagnoses Final diagnoses:  Pain in both feet  Pedal edema  Discoloration of skin of toe    Rx / DC Orders ED Discharge Orders     None        Carlisle Cater, PA-C 03/22/21 1623    Sherwood Gambler, MD 03/22/21 1742

## 2021-03-23 LAB — URINE CULTURE: Culture: NO GROWTH

## 2021-03-25 ENCOUNTER — Other Ambulatory Visit: Payer: Self-pay | Admitting: Family Medicine

## 2021-03-25 ENCOUNTER — Ambulatory Visit (INDEPENDENT_AMBULATORY_CARE_PROVIDER_SITE_OTHER): Payer: Self-pay | Admitting: Family Medicine

## 2021-03-25 ENCOUNTER — Encounter: Payer: Self-pay | Admitting: Family Medicine

## 2021-03-25 ENCOUNTER — Other Ambulatory Visit: Payer: Self-pay

## 2021-03-25 VITALS — BP 140/75 | HR 81 | Ht 74.0 in | Wt 195.4 lb

## 2021-03-25 DIAGNOSIS — M79672 Pain in left foot: Secondary | ICD-10-CM

## 2021-03-25 DIAGNOSIS — M79671 Pain in right foot: Secondary | ICD-10-CM

## 2021-03-25 DIAGNOSIS — R319 Hematuria, unspecified: Secondary | ICD-10-CM

## 2021-03-25 DIAGNOSIS — R809 Proteinuria, unspecified: Secondary | ICD-10-CM

## 2021-03-25 DIAGNOSIS — I739 Peripheral vascular disease, unspecified: Secondary | ICD-10-CM

## 2021-03-25 DIAGNOSIS — R310 Gross hematuria: Secondary | ICD-10-CM

## 2021-03-25 DIAGNOSIS — L819 Disorder of pigmentation, unspecified: Secondary | ICD-10-CM

## 2021-03-25 LAB — POCT URINALYSIS DIP (CLINITEK)
Glucose, UA: NEGATIVE mg/dL
Ketones, POC UA: NEGATIVE mg/dL
Nitrite, UA: POSITIVE — AB
POC PROTEIN,UA: 100 — AB
Spec Grav, UA: 1.025 (ref 1.010–1.025)
Urobilinogen, UA: 1 E.U./dL
pH, UA: 6 (ref 5.0–8.0)

## 2021-03-25 MED ORDER — NAPROXEN 500 MG PO TABS: 500.0000 mg | ORAL_TABLET | Freq: Two times a day (BID) | ORAL | 0 refills | Status: DC

## 2021-03-25 NOTE — Patient Instructions (Addendum)
It was great to meet you Mr Murrow.  Thank you for coming to see [place].  I think you have swelling and pain in your feet due to arteries and veins.  I am prescribing you a medicine called Naproxen you can take 2 times a day for the pain.  Please go to [facility] imaging anytime tomorrow for an X-Ray of your toe.  Come back in 1 week, or sooner if it gets any worse.  Go ahead and start filling out the orange card paperwork, and if you can have it by the next appointment next week.  Someone will reach out to call you to have any needs.  I will also place referral to Urology regarding the blood in your urine.  Be Well, Dr Manus Rudd

## 2021-03-26 DIAGNOSIS — R310 Gross hematuria: Secondary | ICD-10-CM | POA: Insufficient documentation

## 2021-03-26 DIAGNOSIS — I739 Peripheral vascular disease, unspecified: Secondary | ICD-10-CM | POA: Insufficient documentation

## 2021-03-26 DIAGNOSIS — M79672 Pain in left foot: Secondary | ICD-10-CM | POA: Insufficient documentation

## 2021-03-26 DIAGNOSIS — R319 Hematuria, unspecified: Secondary | ICD-10-CM | POA: Insufficient documentation

## 2021-03-26 DIAGNOSIS — R809 Proteinuria, unspecified: Secondary | ICD-10-CM | POA: Insufficient documentation

## 2021-03-26 DIAGNOSIS — L819 Disorder of pigmentation, unspecified: Secondary | ICD-10-CM | POA: Insufficient documentation

## 2021-03-26 DIAGNOSIS — M79671 Pain in right foot: Secondary | ICD-10-CM | POA: Insufficient documentation

## 2021-03-26 LAB — URINALYSIS, MICROSCOPIC ONLY
Casts: NONE SEEN /lpf
RBC, Urine: >30 /hpf — AB (ref 0–2)
WBC, UA: >30 /hpf — AB (ref 0–5)

## 2021-03-26 NOTE — Assessment & Plan Note (Signed)
Diagnosed on ABI.  No sign of necrosis or infection on physical exam.   - Has follow-up with [facility] he is awaiting call about, see if patient has heard from them at appointment next week

## 2021-03-26 NOTE — Assessment & Plan Note (Addendum)
UA obtained confirming.  Patient has had this going on for several months.  Denies significant pain.  Recently treated with antibiotic in ED but has not resolved.  Concern for malignancy given painless gross heamturia.  Previous UA svereal days ago had gross hematuria but no proteinuria, but does have proteinuria today.  Could also have nephrotic syndrome or other kidney disease.  - follow up Urine microscopy - place referral to Urology

## 2021-03-26 NOTE — Progress Notes (Signed)
    SUBJECTIVE:   CHIEF COMPLAINT / HPI: [place]  Patient coming in to establish care.  Has not seen physician in many years.  Recently went to ED given leg swelling and foot pain for the past 3 weeks.  Patient also reports has had painless blood in his urine for some time.  Patient recently quit drinking alcohol 3 weeks ago.  Currently smokes 1/2 pack to 1 pack a day.  Also occasionally smokes marijuana around once every other month.    PERTINENT  PMH / PSH: Peripheral Arterial Disease  OBJECTIVE:   BP 140/75   Pulse 81   Ht '6\' 2"'$  (1.88 m)   Wt 195 lb 6.4 oz (88.6 kg)   SpO2 95%   BMI 25.09 kg/m    Physical Exam HENT:     Head: Normocephalic and atraumatic.     Mouth/Throat:     Mouth: Mucous membranes are moist.  Cardiovascular:     Rate and Rhythm: Normal rate and regular rhythm.     Heart sounds: Normal heart sounds.  Pulmonary:     Effort: Pulmonary effort is normal.     Comments: Significant wheezing throughout lungs Musculoskeletal:     Right lower leg: Edema present.     Left lower leg: Edema present.     Right foot: Normal range of motion.     Left foot: Normal range of motion.     Comments: 2+ pitting edema up to knees bilaterally, unable to palpate DP pulses  Feet:     Left foot:     Skin integrity: No ulcer, blister, skin breakdown, erythema or callus.     Toenail Condition: Left toenails are normal.     Comments: Echymoses of left 2nd toe, picture in chart Skin:    General: Skin is warm.     Findings: No erythema or rash.  Neurological:     Mental Status: He is alert.     ASSESSMENT/PLAN:   Hematuria with proteinuria UA obtained confirming.  Patient has had this going on for several months.  Denies significant pain.  Recently treated with antibiotic in ED but has not resolved.  Concern for malignancy given painless gross heamturia.  Previous UA svereal days ago had gross hematuria but no  - follow up Urine microscopy - place referral to  Urology  Peripheral arterial disease ([place]) Diagnosed on ABI.  No sign of necrosis or infection on physical exam.   - Has follow-up with [facility] he is awaiting call about, see if patient has heard from them at appointment next week  Foot pain, bilateral Likely due to venous insufficiency given significant fluid build-up in legs.  Less likely Venous thombosis given bilateral findings.  Some pain may also be due to claudication from PAD.  Previous CMP shows normal Cr and Transaminases.   - Trial 1 week course of Naproxen 500 mg BID PRN for pain - Recommend compression stockings   Health barriers Deferring patient blood tests or further imaging given paient's lack of insurance. - Gave patient Orange card paperwork to fill out - Placed referral to [facility] - follow up in 1 week for acute problems - Multiple chronic issues for follow-up for future visit including hypertension and smoking cessation    Delora Fuel, MD [place]

## 2021-03-26 NOTE — Addendum Note (Signed)
Addended by: Delora Fuel on: 03/26/2021 04:41 PM   Modules accepted: Orders

## 2021-03-26 NOTE — Assessment & Plan Note (Signed)
2nd left toe.  Signs of ecchymoses.  No sign of any dead tissue or infection.  X-Ray of foot shows no abnormalities.   - Ordered specific X-Ray of toe - Follow-up in 1 week, or sooner if begins to look worse

## 2021-03-26 NOTE — Assessment & Plan Note (Signed)
Likely due to venous insufficiency given significant fluid build-up in legs.  Less likely Venous thombosis given bilateral findings.  Some pain may also be due to claudication from PAD.  Previous CMP shows normal Cr and Transaminases.   - Trial 1 week course of Naproxen 500 mg BID PRN for pain - Recommend compression stockings

## 2021-03-27 LAB — CULTURE, BLOOD (ROUTINE X 2)
Culture: NO GROWTH
Culture: NO GROWTH
Special Requests: ADEQUATE
Special Requests: ADEQUATE

## 2021-03-27 NOTE — Addendum Note (Signed)
Addended by: Owens Shark, Donavon Kimrey on: 03/27/2021 11:48 AM   Modules accepted: Orders

## 2021-03-28 ENCOUNTER — Other Ambulatory Visit: Payer: Self-pay

## 2021-03-28 DIAGNOSIS — I739 Peripheral vascular disease, unspecified: Secondary | ICD-10-CM

## 2021-03-29 ENCOUNTER — Other Ambulatory Visit: Payer: Self-pay

## 2021-03-29 ENCOUNTER — Encounter: Payer: Self-pay | Admitting: Vascular Surgery

## 2021-03-29 ENCOUNTER — Ambulatory Visit (INDEPENDENT_AMBULATORY_CARE_PROVIDER_SITE_OTHER): Payer: Self-pay | Admitting: Vascular Surgery

## 2021-03-29 ENCOUNTER — Encounter (HOSPITAL_COMMUNITY): Payer: Self-pay

## 2021-03-29 VITALS — BP 161/94 | HR 85 | Temp 97.9°F | Resp 16 | Ht 74.0 in | Wt 196.0 lb

## 2021-03-29 DIAGNOSIS — I739 Peripheral vascular disease, unspecified: Secondary | ICD-10-CM

## 2021-03-29 NOTE — Progress Notes (Signed)
Patient name: Arthur Mcmahon MRN: [id number] DOB: 04/12/1966 Sex: male  REASON FOR CONSULT: Evaluate for PAD  HPI: Arthur Mcmahon is a 55 y.o. male, with history of hypertension, tobacco abuse, EtOH abuse that presents for evaluation of PAD in the left foot.  Patient states he got a discolored left second toe that started about 3 weeks ago.  This has been very painful and keeps him awake at night.  He has had no previous lower extremity vascular interventions.  Feels the bottom of the foot hurts as well.  Has also been very concerned about bilateral leg swelling.  He does smoke about a half a pack a day.  States he was taking an aspirin but stopped this recently.  Denies any knowledge of arrhythmia etc.  Past Medical History:  Diagnosis Date   ETOH abuse    Hypertension     Past Surgical History:  Procedure Laterality Date   FRACTURE SURGERY     TONSILLECTOMY      Family History  Problem Relation Age of Onset   Cancer Mother    Heart failure Mother    Cancer Father     SOCIAL HISTORY: Social History   Socioeconomic History   Marital status: Divorced    Spouse name: Not on file   Number of children: Not on file   Years of education: Not on file   Highest education level: Not on file  Occupational History   Not on file  Tobacco Use   Smoking status: Every Day    Packs/day: 1.50    Years: 25.00    Pack years: 37.50    Types: Cigarettes   Smokeless tobacco: Never  Vaping Use   Vaping Use: Never used  Substance and Sexual Activity   Alcohol use: Yes    Comment: daily   Drug use: Yes    Types: Marijuana   Sexual activity: Not on file  Other Topics Concern   Not on file  Social History Narrative   Not on file   Social Determinants of Health   Financial Resource Strain: Not on file  Food Insecurity: Not on file  Transportation Needs: Not on file  Physical Activity: Not on file  Stress: Not on file  Social Connections: Not on file  Intimate Partner  Violence: Not on file    No Known Allergies  Current Outpatient Medications  Medication Sig Dispense Refill   naproxen (NAPROSYN) 500 MG tablet Take 1 tablet (500 mg total) by mouth 2 (two) times daily with a meal. 14 tablet 0   acetaminophen (TYLENOL) 500 MG tablet Take 500 mg by mouth every 6 (six) hours as needed for mild pain, fever or headache. (Patient not taking: Reported on 03/29/2021)     oxyCODONE-acetaminophen (PERCOCET/ROXICET) 5-325 MG tablet Take 1 tablet by mouth every 6 (six) hours as needed for severe pain. (Patient not taking: Reported on 03/29/2021) 10 tablet 0   No current facility-administered medications for this visit.    REVIEW OF SYSTEMS:  '[X]'$  denotes positive finding, '[ ]'$  denotes negative finding Cardiac  Comments:  Chest pain or chest pressure:    Shortness of breath upon exertion:    Short of breath when lying flat:    Irregular heart rhythm:        Vascular    Pain in calf, thigh, or hip brought on by ambulation:    Pain in feet at night that wakes you up from your sleep:     Blood  clot in your veins:    Leg swelling:  x       Pulmonary    Oxygen at home:    Productive cough:     Wheezing:         Neurologic    Sudden weakness in arms or legs:     Sudden numbness in arms or legs:     Sudden onset of difficulty speaking or slurred speech:    Temporary loss of vision in one eye:     Problems with dizziness:         Gastrointestinal    Blood in stool:     Vomited blood:         Genitourinary    Burning when urinating:     Blood in urine:        Psychiatric    Major depression:         Hematologic    Bleeding problems:    Problems with blood clotting too easily:        Skin    Rashes or ulcers:        Constitutional    Fever or chills:      PHYSICAL EXAM: Vitals:   03/29/21 1521  BP: (!) 161/94  Pulse: 85  Resp: 16  Temp: 97.9 F (36.6 C)  TempSrc: Temporal  SpO2: 98%  Weight: 196 lb (88.9 kg)  Height: '6\' 2"'$  (1.88 m)     GENERAL: The patient is a well-nourished male, in no acute distress. The vital signs are documented above. CARDIAC: There is a regular rate and rhythm.  VASCULAR:  Palpable femoral pulses bilaterally No palpable pedal pulses but DP PT Doppler signals and left foot appears motor intact and sensory intact Left second toe appears ischemic PULMONARY: There is good air exchange bilaterally without wheezing or rales. ABDOMEN: Soft and non-tender  MUSCULOSKELETAL: There are no major deformities or cyanosis. NEUROLOGIC: No focal weakness or paresthesias are detected. SKIN: There are no ulcers or rashes noted. PSYCHIATRIC: The patient has a normal affect.       DATA:   ABIs 03/22/21 0.52 on the right and 0.9 on the left  Assessment/Plan:  55 year old male presents for evaluation of discolored left second toe with likely underlying peripheral arterial disease.  Unclear if this is chronic peripheral arterial disease given his risk factors or more acute atheroembolic event.  He describes fairly acute history 3 weeks ago but clearly has peripheral vascular disease based on his other lower extremity.  His ABIs from several weeks ago were 0.9 on the left biphasic.  I cannot appreciate any pedal pulse on exam and he does have a fair amount of swelling with likely some underlying venous insufficiency as well.  I have recommended aortogram with left leg arteriogram and possible intervention.  Discussed he may ultimately require toe amputation if this progresses.  We will get him scheduled for this Thursday.  I have asked that he restart an 81 mg aspirin daily.   [name], MD [facility] Office: [phone]

## 2021-03-30 ENCOUNTER — Telehealth: Payer: Self-pay

## 2021-03-30 NOTE — Telephone Encounter (Signed)
   Telephone encounter was:  Unsuccessful.  03/30/2021 Name: Arthur Mcmahon MRN: [id number] DOB: 1966-05-29  Unsuccessful outbound call made today to assist with:   insurance information.  Outreach Attempt:  1st Attempt  A HIPAA compliant voice message was left requesting a return call.  Instructed patient to call back at [phone].  [name], AAS Paralegal, [facility]  [address], [place] [address] ??[email or web address]  ?? [id number]   [email or web address]

## 2021-03-31 ENCOUNTER — Ambulatory Visit (HOSPITAL_COMMUNITY)
Admission: RE | Admit: 2021-03-31 | Discharge: 2021-03-31 | Disposition: A | Discharge status: Home / Self Care | Payer: Self-pay | Attending: Vascular Surgery | Admitting: Vascular Surgery

## 2021-03-31 ENCOUNTER — Other Ambulatory Visit: Payer: Self-pay

## 2021-03-31 ENCOUNTER — Encounter (HOSPITAL_COMMUNITY)
Admission: RE | Disposition: A | Discharge status: Home / Self Care | Payer: Self-pay | Source: Home / Self Care | Attending: Vascular Surgery

## 2021-03-31 DIAGNOSIS — I70212 Atherosclerosis of native arteries of extremities with intermittent claudication, left leg: Secondary | ICD-10-CM | POA: Insufficient documentation

## 2021-03-31 DIAGNOSIS — I739 Peripheral vascular disease, unspecified: Secondary | ICD-10-CM

## 2021-03-31 HISTORY — PX: ABDOMINAL AORTOGRAM W/LOWER EXTREMITY: CATH118223

## 2021-03-31 LAB — POCT I-STAT, CHEM 8
BUN: 3 mg/dL — ABNORMAL LOW (ref 6–20)
Calcium, Ion: 1.24 mmol/L (ref 1.15–1.40)
Chloride: 98 mmol/L (ref 98–111)
Creatinine, Ser: 0.7 mg/dL (ref 0.61–1.24)
Glucose, Bld: 93 mg/dL (ref 70–99)
HCT: 46 % (ref 39.0–52.0)
Hemoglobin: 15.6 g/dL (ref 13.0–17.0)
Potassium: 4.6 mmol/L (ref 3.5–5.1)
Sodium: 136 mmol/L (ref 135–145)
TCO2: 29 mmol/L (ref 22–32)

## 2021-03-31 SURGERY — ABDOMINAL AORTOGRAM W/LOWER EXTREMITY
Anesthesia: LOCAL | Laterality: Bilateral

## 2021-03-31 MED ORDER — FENTANYL CITRATE (PF) 100 MCG/2ML IJ SOLN
INTRAMUSCULAR | Status: DC | PRN
  Administered 2021-03-31: 50 ug via INTRAVENOUS

## 2021-03-31 MED ORDER — SODIUM CHLORIDE 0.9% FLUSH: 3.0000 mL | Freq: Two times a day (BID) | INTRAVENOUS | Status: DC

## 2021-03-31 MED ORDER — MIDAZOLAM HCL 2 MG/2ML IJ SOLN
INTRAMUSCULAR | Status: AC
  Filled 2021-03-31: qty 2

## 2021-03-31 MED ORDER — ASPIRIN EC 81 MG PO TBEC: 81.0000 mg | DELAYED_RELEASE_TABLET | Freq: Every day | ORAL | Status: DC

## 2021-03-31 MED ORDER — SODIUM CHLORIDE 0.9 % IV SOLN: INTRAVENOUS | Status: AC

## 2021-03-31 MED ORDER — SODIUM CHLORIDE 0.9% FLUSH: 3.0000 mL | INTRAVENOUS | Status: DC | PRN

## 2021-03-31 MED ORDER — ATORVASTATIN CALCIUM 40 MG PO TABS: 40.0000 mg | ORAL_TABLET | Freq: Every day | ORAL | 11 refills | Status: DC

## 2021-03-31 MED ORDER — ASPIRIN EC 81 MG PO TBEC: 81.0000 mg | DELAYED_RELEASE_TABLET | Freq: Every day | ORAL | 0 refills | Status: DC

## 2021-03-31 MED ORDER — LABETALOL HCL 5 MG/ML IV SOLN: 10.0000 mg | INTRAVENOUS | Status: DC | PRN

## 2021-03-31 MED ORDER — LIDOCAINE HCL (PF) 1 % IJ SOLN
INTRAMUSCULAR | Status: AC
  Filled 2021-03-31: qty 30

## 2021-03-31 MED ORDER — FENTANYL CITRATE (PF) 100 MCG/2ML IJ SOLN
INTRAMUSCULAR | Status: AC
  Filled 2021-03-31: qty 2

## 2021-03-31 MED ORDER — IODIXANOL 320 MG/ML IV SOLN
INTRAVENOUS | Status: DC | PRN
  Administered 2021-03-31: 107 mL via INTRA_ARTERIAL

## 2021-03-31 MED ORDER — MIDAZOLAM HCL 2 MG/2ML IJ SOLN
INTRAMUSCULAR | Status: DC | PRN
  Administered 2021-03-31: 2 mg via INTRAVENOUS

## 2021-03-31 MED ORDER — SODIUM CHLORIDE 0.9 % IV SOLN: 250.0000 mL | INTRAVENOUS | Status: DC | PRN

## 2021-03-31 MED ORDER — ONDANSETRON HCL 4 MG/2ML IJ SOLN: 4.0000 mg | Freq: Four times a day (QID) | INTRAMUSCULAR | Status: DC | PRN

## 2021-03-31 MED ORDER — LIDOCAINE HCL (PF) 1 % IJ SOLN
INTRAMUSCULAR | Status: DC | PRN
  Administered 2021-03-31: 18 mL via INTRADERMAL

## 2021-03-31 MED ORDER — ATORVASTATIN CALCIUM 40 MG PO TABS: 40.0000 mg | ORAL_TABLET | Freq: Every day | ORAL | Status: DC

## 2021-03-31 MED ORDER — ACETAMINOPHEN 325 MG PO TABS: 650.0000 mg | ORAL_TABLET | ORAL | Status: DC | PRN

## 2021-03-31 MED ORDER — SODIUM CHLORIDE 0.9 % IV SOLN
INTRAVENOUS | Status: DC
Start: 2021-03-31 — End: 2021-03-31

## 2021-03-31 MED ORDER — HYDRALAZINE HCL 20 MG/ML IJ SOLN: 5.0000 mg | INTRAMUSCULAR | Status: DC | PRN

## 2021-03-31 MED ORDER — HEPARIN (PORCINE) IN NACL 1000-0.9 UT/500ML-% IV SOLN
INTRAVENOUS | Status: AC
  Filled 2021-03-31: qty 1000

## 2021-03-31 MED ORDER — HEPARIN (PORCINE) IN NACL 1000-0.9 UT/500ML-% IV SOLN
INTRAVENOUS | Status: DC | PRN
  Administered 2021-03-31 (×2): 500 mL

## 2021-03-31 SURGICAL SUPPLY — 9 items
CATH OMNI FLUSH 5F 65CM (CATHETERS) ×2 IMPLANT
KIT MICROPUNCTURE NIT STIFF (SHEATH) ×2 IMPLANT
KIT PV (KITS) ×2 IMPLANT
SHEATH PINNACLE 5F 10CM (SHEATH) ×2 IMPLANT
SHEATH PROBE COVER 6X72 (BAG) ×2 IMPLANT
SYR MEDRAD MARK V 150ML (SYRINGE) ×2 IMPLANT
TRANSDUCER W/STOPCOCK (MISCELLANEOUS) ×2 IMPLANT
TRAY PV CATH (CUSTOM PROCEDURE TRAY) ×2 IMPLANT
WIRE BENTSON .035X145CM (WIRE) ×2 IMPLANT

## 2021-03-31 NOTE — Discharge Instructions (Signed)
Femoral Site Care This sheet gives you information about how to care for yourself after your procedure. Your health care provider may also give you more specific instructions. If you have problems or questions, contact your health care provider. What can I expect after the procedure? After the procedure, it is common to have: Bruising that usually fades within 1-2 weeks. Tenderness at the site. Follow these instructions at home: Wound care May remove bandage after 24 hours. Do not take baths, swim, or use a hot tub for 5 days. You may shower 24-48 hours after the procedure. Gently wash the site with plain soap and water. Pat the area dry with a clean towel. Do not rub the site. This may cause bleeding. Do not apply powder or lotion to the site. Keep the site clean and dry. Check your femoral site every day for signs of infection. Check for: Redness, swelling, or pain. Fluid or blood. Warmth. Pus or a bad smell. Activity For the first 2-3 days after your procedure, or as long as directed: Avoid climbing stairs as much as possible. Do not squat. Do not lift, push or pull anything that is heavier than 10 lb for 5 days. Rest as directed. Avoid sitting for a long time without moving. Get up to take short walks every 1-2 hours. Do not drive for 24 hours. General instructions Take over-the-counter and prescription medicines only as told by your health care provider. Keep all follow-up visits as told by your health care provider. This is important. DRINK PLENTY OF FLUIDS FOR THE NEXT 2-3 DAYS. Contact a health care provider if you have: A fever or chills. You have redness, swelling, or pain around your insertion site. Get help right away if: The catheter insertion area swells very fast. You pass out. You suddenly start to sweat or your skin gets clammy. The catheter insertion area is bleeding, and the bleeding does not stop when you hold steady pressure on the area. The area near or  just beyond the catheter insertion site becomes pale, cool, tingly, or numb. These symptoms may represent a serious problem that is an emergency. Do not wait to see if the symptoms will go away. Get medical help right away. Call your local emergency services (911 in the [place] Do not drive yourself to the hospital. Summary After the procedure, it is common to have bruising that usually fades within 1-2 weeks. Check your femoral site every day for signs of infection. Do not lift, push or pull anything that is heavier than 10 lb for 5 days.  This information is not intended to replace advice given to you by your health care provider. Make sure you discuss any questions you have with your health care provider. Document Revised: 09/03/2017 Document Reviewed: 09/03/2017 Elsevier Patient Education  2020 Elsevier Inc.  

## 2021-03-31 NOTE — Progress Notes (Signed)
Pt ambulated without difficulty or bleeding.   Discharged home with sister who will drive and stay with pt x 24 hrs 

## 2021-03-31 NOTE — Progress Notes (Signed)
SITE AREA: right femoral/groin  SITE PRIOR TO REMOVAL:  LEVEL 0  PRESSURE APPLIED FOR: approximately 20 minutes  MANUAL: yes  PATIENT STATUS DURING PULL: stable  POST PULL SITE:  LEVEL 0  POST PULL INSTRUCTIONS GIVEN: yes  POST PULL PULSES PRESENT: bilateral pedal and post tibial pulses are dopplerable  DRESSING APPLIED: gauze with tegaderm  BEDREST BEGINS @ 1150  COMMENTS:

## 2021-03-31 NOTE — H&P (Signed)
History and Physical Interval Note:  03/31/2021 9:13 AM  Arthur Mcmahon  has presented today for surgery, with the diagnosis of pad.  The various methods of treatment have been discussed with the patient and family. After consideration of risks, benefits and other options for treatment, the patient has consented to  Procedure(s): ABDOMINAL AORTOGRAM W/LOWER EXTREMITY (N/A) as a surgical intervention.  The patient's history has been reviewed, patient examined, no change in status, stable for surgery.  I have reviewed the patient's chart and labs.  Questions were answered to the patient's satisfaction.     [name]  Patient name: Arthur Mcmahon            MRN: [id number]        DOB: [birth year]          Sex: male   REASON FOR CONSULT: Evaluate for PAD   HPI: [name] is a 55 y.o. male, with history of hypertension, tobacco abuse, EtOH abuse that presents for evaluation of PAD in the left foot.  Patient states he got a discolored left second toe that started about 3 weeks ago.  This has been very painful and keeps him awake at night.  He has had no previous lower extremity vascular interventions.  Feels the bottom of the foot hurts as well.  Has also been very concerned about bilateral leg swelling.  He does smoke about a half a pack a day.  States he was taking an aspirin but stopped this recently.  Denies any knowledge of arrhythmia etc.       Past Medical History:  Diagnosis Date   ETOH abuse     Hypertension             Past Surgical History:  Procedure Laterality Date   FRACTURE SURGERY       TONSILLECTOMY               Family History  Problem Relation Age of Onset   Cancer Mother     Heart failure Mother     Cancer Father        SOCIAL HISTORY: Social History         Socioeconomic History   Marital status: Divorced      Spouse name: Not on file   Number of children: Not on file   Years of education: Not on file   Highest education level: Not on file   Occupational History   Not on file  Tobacco Use   Smoking status: Every Day      Packs/day: 1.50      Years: 25.00      Pack years: 37.50      Types: Cigarettes   Smokeless tobacco: Never  Vaping Use   Vaping Use: Never used  Substance and Sexual Activity   Alcohol use: Yes      Comment: daily   Drug use: Yes      Types: Marijuana   Sexual activity: Not on file  Other Topics Concern   Not on file  Social History Narrative   Not on file    Social Determinants of Health    Financial Resource Strain: Not on file  Food Insecurity: Not on file  Transportation Needs: Not on file  Physical Activity: Not on file  Stress: Not on file  Social Connections: Not on file  Intimate Partner Violence: Not on file      No Known Allergies  Current Outpatient Medications  Medication Sig Dispense Refill   naproxen (NAPROSYN) 500 MG tablet Take 1 tablet (500 mg total) by mouth 2 (two) times daily with a meal. 14 tablet 0   acetaminophen (TYLENOL) 500 MG tablet Take 500 mg by mouth every 6 (six) hours as needed for mild pain, fever or headache. (Patient not taking: Reported on 03/29/2021)       oxyCODONE-acetaminophen (PERCOCET/ROXICET) 5-325 MG tablet Take 1 tablet by mouth every 6 (six) hours as needed for severe pain. (Patient not taking: Reported on 03/29/2021) 10 tablet 0    No current facility-administered medications for this visit.      REVIEW OF SYSTEMS:  '[X]'$  denotes positive finding, '[ ]'$  denotes negative finding Cardiac   Comments:  Chest pain or chest pressure:      Shortness of breath upon exertion:      Short of breath when lying flat:      Irregular heart rhythm:             Vascular      Pain in calf, thigh, or hip brought on by ambulation:      Pain in feet at night that wakes you up from your sleep:      Blood clot in your veins:      Leg swelling:  x           Pulmonary      Oxygen at home:      Productive cough:      Wheezing:             Neurologic       Sudden weakness in arms or legs:      Sudden numbness in arms or legs:      Sudden onset of difficulty speaking or slurred speech:      Temporary loss of vision in one eye:      Problems with dizziness:             Gastrointestinal      Blood in stool:      Vomited blood:             Genitourinary      Burning when urinating:      Blood in urine:             Psychiatric      Major depression:             Hematologic      Bleeding problems:      Problems with blood clotting too easily:             Skin      Rashes or ulcers:             Constitutional      Fever or chills:          PHYSICAL EXAM:    Vitals:    03/29/21 1521  BP: (!) 161/94  Pulse: 85  Resp: 16  Temp: 97.9 F (36.6 C)  TempSrc: Temporal  SpO2: 98%  Weight: 196 lb (88.9 kg)  Height: '6\' 2"'$  (1.88 m)      GENERAL: The patient is a well-nourished male, in no acute distress. The vital signs are documented above. CARDIAC: There is a regular rate and rhythm. VASCULAR:  Palpable femoral pulses bilaterally No palpable pedal pulses but DP PT Doppler signals and left foot appears motor intact and sensory intact Left second toe appears ischemic PULMONARY: There is good air exchange bilaterally without  wheezing or rales. ABDOMEN: Soft and non-tender MUSCULOSKELETAL: There are no major deformities or cyanosis. NEUROLOGIC: No focal weakness or paresthesias are detected. SKIN: There are no ulcers or rashes noted. PSYCHIATRIC: The patient has a normal affect.           DATA:    ABIs 03/22/21 0.52 on the right and 0.9 on the left   Assessment/Plan:   55 year old male presents for evaluation of discolored left second toe with likely underlying peripheral arterial disease.  Unclear if this is chronic peripheral arterial disease given his risk factors or more acute atheroembolic event.  He describes fairly acute history 3 weeks ago but clearly has peripheral vascular disease based on his other lower  extremity.  His ABIs from several weeks ago were 0.9 on the left biphasic.  I cannot appreciate any pedal pulse on exam and he does have a fair amount of swelling with likely some underlying venous insufficiency as well.  I have recommended aortogram with left leg arteriogram and possible intervention.  Discussed he may ultimately require toe amputation if this progresses.  We will get him scheduled for this Thursday.  I have asked that he restart an 81 mg aspirin daily.     [name], MD [facility] Office: [phone]

## 2021-03-31 NOTE — Op Note (Signed)
    Patient name: Arthur Mcmahon MRN: [id number] DOB: April 03, 1966 Sex: male  03/31/2021 Pre-operative Diagnosis: Left second toe tissue loss with underlying peripheral arterial disease Post-operative diagnosis:  Same Surgeon:  [name], MD Procedure Performed: 1.  Ultrasound-guided access right common femoral artery 2.  Aortogram including catheter selection of aorta 3.  Bilateral lower extremity arteriogram occluding selection of second-order branches in the left lower extremity 4.  48 minutes of monitored moderate conscious sedation time  Indications: Patient is a 55 year old male who was seen in the office earlier this week with ischemic changes to the left second toe.  He presents today for planned aortogram with lower extremity arteriogram and possible intervention after risks benefits discussed.  Findings:   Aortogram showed no flow-limiting stenosis in the aortoiliac segment and both renal arteries were widely patent  The left lower extremity which is the side of interest showed multiple high-grade calcified plaques in the left common femoral artery with greater than 80% stenosis.  The SFA is calcified but patent with no flow-limiting stenosis.  The above-knee popliteal artery has about a 60 to 70% calcified stenosis that is focal and the below-knee popliteal artery is patent and widely patent three-vessel runoff in the tibials.  Right lower extremity runoff shows a subtotally occluded right common femoral artery.  The SFA is calcified with moderate disease but patent.  Right above and below knee popliteal artery is patent.  He has three-vessel runoff in the right lower extremity with heavily diseased and calcified TP trunk and proximal peroneal artery.   Procedure:  The patient was identified in the holding area and taken to room 8.  The patient was then placed supine on the table and prepped and draped in the usual sterile fashion.  A time out was called.  Ultrasound was used  to evaluate the right common femoral artery.  It was very diseased in mid to distal segment and patent more proximally .  A digital ultrasound image was acquired.  A micropuncture needle was used to access the right common femoral artery under ultrasound guidance.  An 018 wire was advanced without resistance and a micropuncture sheath was placed.  The 018 wire was removed and a benson wire was placed.  The micropuncture sheath was exchanged for a 5 french sheath.  An omniflush catheter was advanced over the wire to the level of L-1.  An abdominal angiogram was obtained.  Next the catheter was pulled down and bilateral lower extremity runoff was obtained.  I then crossed the aortic bifurcation with a Omni Flush catheter and got a wire into the distal left external iliac to evaluate the left leg tibial runoff.  After evaluating images he will need surgical intervention in the operating room.  Wires and catheters removed.  He will be taken to holding to have the sheath removed from the right groin.   Plan: Patient has been placed on 81 mg aspirin and statin.  Patient will be scheduled for a left common femoral endarterectomy with possible antegrade above-knee popliteal stent and possible left second toe amputation.  [name], MD [facility] Office: [phone]

## 2021-04-01 ENCOUNTER — Other Ambulatory Visit: Payer: Self-pay | Admitting: Family Medicine

## 2021-04-01 ENCOUNTER — Encounter (HOSPITAL_COMMUNITY): Payer: Self-pay | Admitting: Vascular Surgery

## 2021-04-05 ENCOUNTER — Telehealth: Payer: Self-pay

## 2021-04-05 NOTE — Telephone Encounter (Signed)
   Telephone encounter was:  Successful.  04/05/2021 Name: Arthur Mcmahon MRN: [id number] DOB: [birth year]  Arthur Mcmahon is a 55 y.o. year old male who is a primary care patient of Maness, Philip, MD . The community resource team was consulted for assistance with  insurance.  Care guide performed the following interventions: Spoke with patient he is in the process of moving and has not had time to complete the [employer] application. Patient stated that his sister is going to help him complete the application this weekend.  I asked if there was anything else that he needed and he stated he did not need anything else right now.  Follow Up Plan:  No further follow up planned at this time. The patient has been provided with needed resources.  [name], AAS Paralegal, [facility]  [address], [place] [address] ??[email or web address]  ?? [phone]   [email or web address]

## 2021-04-07 ENCOUNTER — Other Ambulatory Visit: Payer: Self-pay | Admitting: Vascular Surgery

## 2021-04-07 LAB — SARS CORONAVIRUS 2 (TAT 6-24 HRS): SARS Coronavirus 2: NEGATIVE

## 2021-04-08 ENCOUNTER — Other Ambulatory Visit: Payer: Self-pay

## 2021-04-08 ENCOUNTER — Encounter (HOSPITAL_COMMUNITY): Payer: Self-pay | Admitting: Vascular Surgery

## 2021-04-08 ENCOUNTER — Ambulatory Visit (INDEPENDENT_AMBULATORY_CARE_PROVIDER_SITE_OTHER): Payer: Self-pay | Admitting: Family Medicine

## 2021-04-08 DIAGNOSIS — I739 Peripheral vascular disease, unspecified: Secondary | ICD-10-CM

## 2021-04-08 DIAGNOSIS — Z720 Tobacco use: Secondary | ICD-10-CM | POA: Insufficient documentation

## 2021-04-08 DIAGNOSIS — F17200 Nicotine dependence, unspecified, uncomplicated: Secondary | ICD-10-CM | POA: Insufficient documentation

## 2021-04-08 MED ORDER — OXYCODONE HCL 10 MG PO TABS: 10.0000 mg | ORAL_TABLET | ORAL | 0 refills | Status: DC | PRN

## 2021-04-08 NOTE — Progress Notes (Signed)
PCP - Delora Fuel Cardiologist - denies  PPM/ICD - denies   Chest x-ray - n/a EKG - 03/31/21 Stress Test - denies ECHO - denies Cardiac Cath - denies  CPAP - no  No diabetes  Patient instructed to hold all Aspirin, NSAID's, herbal medications, fish oil and vitamins 7 days prior to surgery.   ERAS Protcol - no  COVID TEST- 04/07/21 negative  Anesthesia review: no  Patient verbally denies any shortness of breath, fever, cough and chest pain during phone call   -------------  SDW INSTRUCTIONS given:  Your procedure is scheduled on 04/11/21.  Report to [facility] Main Entrance "A" at 10:45 A.M., and check in at the Admitting office.  Call this number if you have problems the morning of surgery:  [phone]   Remember:  Do not eat or drink after midnight the night before your surgery      Take these medicines the morning of surgery with A SIP OF WATER  acetaminophen (TYLENOL) if needed atorvastatin (LIPITOR) oxyCODONE-acetaminophen (PERCOCET/ROXICET) if needed   As of today, STOP taking any Aspirin (unless otherwise instructed by your surgeon) Aleve, Naproxen, Ibuprofen, Motrin, Advil, Goody's, BC's, all herbal medications, fish oil, and all vitamins.                      Do not wear jewelry, make up, or nail polish            Do not wear lotions, powders, perfumes/colognes, or deodorant.            Do not shave 48 hours prior to surgery.  Men may shave face and neck.            Do not bring valuables to the hospital.            [facility] is not responsible for any belongings or valuables.  Do NOT Smoke (Tobacco/Vaping) or drink Alcohol 24 hours prior to your procedure If you use a CPAP at night, you may bring all equipment for your overnight stay.   Contacts, glasses, dentures or bridgework may not be worn into surgery.      For patients admitted to the hospital, discharge time will be determined by your treatment team.   Patients discharged the day of  surgery will not be allowed to drive home, and someone needs to stay with them for 24 hours.    Special instructions:   [facility]- Preparing For Surgery  Before surgery, you can play an important role. Because skin is not sterile, your skin needs to be as free of germs as possible. You can reduce the number of germs on your skin by washing with CHG (chlorahexidine gluconate) Soap before surgery.  CHG is an antiseptic cleaner which kills germs and bonds with the skin to continue killing germs even after washing.    Oral Hygiene is also important to reduce your risk of infection.  Remember - BRUSH YOUR TEETH THE MORNING OF SURGERY WITH YOUR REGULAR TOOTHPASTE  Please do not use if you have an allergy to CHG or antibacterial soaps. If your skin becomes reddened/irritated stop using the CHG.  Do not shave (including legs and underarms) for at least 48 hours prior to first CHG shower. It is OK to shave your face.  Please follow these instructions carefully.   Shower the NIGHT BEFORE SURGERY and the MORNING OF SURGERY with DIAL Soap.   Pat yourself dry with a CLEAN TOWEL.  Wear CLEAN PAJAMAS to  bed the night before surgery  Place CLEAN SHEETS on your bed the night of your first shower and DO NOT SLEEP WITH PETS.   Day of Surgery: Please shower morning of surgery  Wear Clean/Comfortable clothing the morning of surgery Do not apply any deodorants/lotions.   Remember to brush your teeth WITH YOUR REGULAR TOOTHPASTE.   Questions were answered. Patient verbalized understanding of instructions.

## 2021-04-08 NOTE — Progress Notes (Signed)
    SUBJECTIVE:   CHIEF COMPLAINT / HPI: Severe PAD of left leg  Patient here for follow-up visit.  Has procedure scheduled for [facility] on Monday August 8th.  Indicates still having significant pain and asking if anything able to take prior to procedure.  PERTINENT  PMH / PSH: PAD, Hematuria, Tobacco use disorder  OBJECTIVE:   BP 139/87   Pulse 80   Ht '6\' 2"'$  (1.88 m)   Wt 199 lb 6.4 oz (90.4 kg)   SpO2 100%   BMI 25.60 kg/m    Physical Exam Constitutional:      General: He is not in acute distress.    Appearance: He is not toxic-appearing or diaphoretic.  HENT:     Head: Normocephalic and atraumatic.  Cardiovascular:     Rate and Rhythm: Normal rate.  Pulmonary:     Effort: Pulmonary effort is normal.  Musculoskeletal:     Right lower leg: Edema present.     Left lower leg: Edema present.  Feet:     Right foot:     Skin integrity: Skin integrity normal.     Left foot:     Skin integrity: Skin integrity normal.     Comments: Left 2nd toe unchanged from previous visit Neurological:     Mental Status: He is alert.     ASSESSMENT/PLAN:   Peripheral arterial disease ([place]) Patient to have surgical procedure with [facility] on Monday.  Having significant left leg pain still that indicates was not responsive to Naproxen or Percocet 5-325. - Prescribe 3 day course of Oxycodone 10 mg q4hr PRN for claudication - Fill out [employer] paperwork - follow up in 3 weeks    Delora Fuel, MD [place]

## 2021-04-08 NOTE — Patient Instructions (Addendum)
It was good to see you today.  Thank you for coming in.  I am writing you a prescription for Oxycodone 10 mg to be taken every 4 hours as needed prior to your surgery for leg pain.  You can take Tylenol every 6 hours in addition to this.  Please be sure to work on orange card forms this weekend.  Good luck with surgery on Monday.  I would like to see you again in 3 weeks.  Be Well, Dr Manus Rudd

## 2021-04-09 ENCOUNTER — Encounter: Payer: Self-pay | Admitting: Family Medicine

## 2021-04-09 NOTE — Assessment & Plan Note (Addendum)
Patient to have surgical procedure with [facility] on Monday.  Having significant left leg pain still that indicates was not responsive to Naproxen or Percocet 5-325. - Prescribe 3 day course of Oxycodone 10 mg q4hr PRN for claudication, instructed to stop taking after Midnight prior to day of procedure - Fill out [employer] paperwork - follow up in 3 weeks

## 2021-04-11 ENCOUNTER — Inpatient Hospital Stay (HOSPITAL_COMMUNITY): Payer: Medicaid Other

## 2021-04-11 ENCOUNTER — Inpatient Hospital Stay (HOSPITAL_COMMUNITY)
Admission: RE | Admit: 2021-04-11 | Discharge: 2021-04-13 | DRG: 254 | Disposition: A | Discharge status: Home / Self Care | Payer: Medicaid Other | Attending: Vascular Surgery | Admitting: Vascular Surgery

## 2021-04-11 ENCOUNTER — Encounter (HOSPITAL_COMMUNITY)
Admission: RE | Disposition: A | Discharge status: Home / Self Care | Payer: Self-pay | Source: Home / Self Care | Attending: Vascular Surgery

## 2021-04-11 ENCOUNTER — Encounter (HOSPITAL_COMMUNITY): Payer: Self-pay | Admitting: Vascular Surgery

## 2021-04-11 ENCOUNTER — Inpatient Hospital Stay (HOSPITAL_COMMUNITY): Payer: Medicaid Other | Admitting: Anesthesiology

## 2021-04-11 ENCOUNTER — Other Ambulatory Visit: Payer: Self-pay

## 2021-04-11 DIAGNOSIS — I10 Essential (primary) hypertension: Secondary | ICD-10-CM | POA: Diagnosis present

## 2021-04-11 DIAGNOSIS — Z8249 Family history of ischemic heart disease and other diseases of the circulatory system: Secondary | ICD-10-CM | POA: Diagnosis not present

## 2021-04-11 DIAGNOSIS — Z791 Long term (current) use of non-steroidal anti-inflammatories (NSAID): Secondary | ICD-10-CM

## 2021-04-11 DIAGNOSIS — F17210 Nicotine dependence, cigarettes, uncomplicated: Secondary | ICD-10-CM | POA: Diagnosis present

## 2021-04-11 DIAGNOSIS — I779 Disorder of arteries and arterioles, unspecified: Secondary | ICD-10-CM | POA: Diagnosis present

## 2021-04-11 DIAGNOSIS — I70222 Atherosclerosis of native arteries of extremities with rest pain, left leg: Principal | ICD-10-CM | POA: Diagnosis present

## 2021-04-11 DIAGNOSIS — I872 Venous insufficiency (chronic) (peripheral): Secondary | ICD-10-CM | POA: Diagnosis present

## 2021-04-11 HISTORY — DX: Anemia, unspecified: D64.9

## 2021-04-11 HISTORY — PX: PATCH ANGIOPLASTY: SHX6230

## 2021-04-11 HISTORY — PX: INTRAOPERATIVE ARTERIOGRAM: SHX5157

## 2021-04-11 HISTORY — PX: ENDARTERECTOMY FEMORAL: SHX5804

## 2021-04-11 HISTORY — PX: INSERTION OF ILIAC STENT: SHX6256

## 2021-04-11 HISTORY — DX: Peripheral vascular disease, unspecified: I73.9

## 2021-04-11 LAB — COMPREHENSIVE METABOLIC PANEL
ALT: 10 U/L (ref 0–44)
AST: 16 U/L (ref 15–41)
Albumin: 3 g/dL — ABNORMAL LOW (ref 3.5–5.0)
Alkaline Phosphatase: 68 U/L (ref 38–126)
Anion gap: 10 (ref 5–15)
BUN: <5 mg/dL — ABNORMAL LOW (ref 6–20)
CO2: 25 mmol/L (ref 22–32)
Calcium: 8.9 mg/dL (ref 8.9–10.3)
Chloride: 97 mmol/L — ABNORMAL LOW (ref 98–111)
Creatinine, Ser: 0.81 mg/dL (ref 0.61–1.24)
GFR, Estimated: >60 mL/min (ref >60)
Glucose, Bld: 86 mg/dL (ref 70–99)
Potassium: 4.1 mmol/L (ref 3.5–5.1)
Sodium: 132 mmol/L — ABNORMAL LOW (ref 135–145)
Total Bilirubin: 0.8 mg/dL (ref 0.3–1.2)
Total Protein: 7.1 g/dL (ref 6.5–8.1)

## 2021-04-11 LAB — URINALYSIS, ROUTINE W REFLEX MICROSCOPIC
Bilirubin Urine: NEGATIVE
Glucose, UA: NEGATIVE mg/dL
Ketones, ur: NEGATIVE mg/dL
Nitrite: NEGATIVE
Protein, ur: 100 mg/dL — AB
RBC / HPF: >50 RBC/hpf — ABNORMAL HIGH (ref 0–5)
Specific Gravity, Urine: 1.017 (ref 1.005–1.030)
WBC, UA: >50 WBC/hpf — ABNORMAL HIGH (ref 0–5)
pH: 5 (ref 5.0–8.0)

## 2021-04-11 LAB — CBC
HCT: 46.5 % (ref 39.0–52.0)
Hemoglobin: 15.6 g/dL (ref 13.0–17.0)
MCH: 33.8 pg (ref 26.0–34.0)
MCHC: 33.5 g/dL (ref 30.0–36.0)
MCV: 100.9 fL — ABNORMAL HIGH (ref 80.0–100.0)
Platelets: 227 10*3/uL (ref 150–400)
RBC: 4.61 MIL/uL (ref 4.22–5.81)
RDW: 12.1 % (ref 11.5–15.5)
WBC: 6.8 10*3/uL (ref 4.0–10.5)
nRBC: 0 % (ref 0.0–0.2)

## 2021-04-11 LAB — APTT: aPTT: 26 seconds (ref 24–36)

## 2021-04-11 LAB — SURGICAL PCR SCREEN
MRSA, PCR: NEGATIVE
Staphylococcus aureus: NEGATIVE

## 2021-04-11 LAB — ABO/RH: ABO/RH(D): O NEG

## 2021-04-11 LAB — TYPE AND SCREEN
ABO/RH(D): O NEG
Antibody Screen: NEGATIVE

## 2021-04-11 LAB — PROTIME-INR
INR: 1.1 (ref 0.8–1.2)
Prothrombin Time: 13.9 seconds (ref 11.4–15.2)

## 2021-04-11 SURGERY — ENDARTERECTOMY, FEMORAL
Anesthesia: General | Site: Leg Upper | Laterality: Left

## 2021-04-11 MED ORDER — BISACODYL 5 MG PO TBEC: 5.0000 mg | DELAYED_RELEASE_TABLET | Freq: Every day | ORAL | Status: DC | PRN

## 2021-04-11 MED ORDER — POLYETHYLENE GLYCOL 3350 17 G PO PACK: 17.0000 g | PACK | Freq: Every day | ORAL | Status: DC | PRN

## 2021-04-11 MED ORDER — HEPARIN 6000 UNIT IRRIGATION SOLUTION
Status: AC
  Filled 2021-04-11: qty 500

## 2021-04-11 MED ORDER — ATORVASTATIN CALCIUM 40 MG PO TABS
40.0000 mg | ORAL_TABLET | Freq: Every day | ORAL | Status: DC
  Administered 2021-04-11 – 2021-04-13 (×3): 40 mg via ORAL
  Filled 2021-04-11 (×3): qty 1

## 2021-04-11 MED ORDER — FENTANYL CITRATE (PF) 100 MCG/2ML IJ SOLN
INTRAMUSCULAR | Status: AC
  Administered 2021-04-11: 25 ug
  Filled 2021-04-11: qty 2

## 2021-04-11 MED ORDER — ONDANSETRON HCL 4 MG/2ML IJ SOLN: 4.0000 mg | Freq: Four times a day (QID) | INTRAMUSCULAR | Status: DC | PRN

## 2021-04-11 MED ORDER — LABETALOL HCL 5 MG/ML IV SOLN
10.0000 mg | INTRAVENOUS | Status: DC | PRN
Start: 2021-04-11 — End: 2021-04-13
  Administered 2021-04-11: 10 mg via INTRAVENOUS
  Filled 2021-04-11: qty 4

## 2021-04-11 MED ORDER — HEPARIN 6000 UNIT IRRIGATION SOLUTION
Status: DC | PRN
  Administered 2021-04-11: 1

## 2021-04-11 MED ORDER — CHLORHEXIDINE GLUCONATE CLOTH 2 % EX PADS: 6.0000 | MEDICATED_PAD | Freq: Once | CUTANEOUS | Status: DC

## 2021-04-11 MED ORDER — LIDOCAINE 2% (20 MG/ML) 5 ML SYRINGE
INTRAMUSCULAR | Status: DC | PRN
  Administered 2021-04-11: 60 mg via INTRAVENOUS

## 2021-04-11 MED ORDER — PROTAMINE SULFATE 10 MG/ML IV SOLN
INTRAVENOUS | Status: DC | PRN
  Administered 2021-04-11: 10 mg via INTRAVENOUS
  Administered 2021-04-11: 40 mg via INTRAVENOUS

## 2021-04-11 MED ORDER — ALUM & MAG HYDROXIDE-SIMETH 200-200-20 MG/5ML PO SUSP: 15.0000 mL | ORAL | Status: DC | PRN

## 2021-04-11 MED ORDER — MAGNESIUM SULFATE 2 GM/50ML IV SOLN: 2.0000 g | Freq: Every day | INTRAVENOUS | Status: DC | PRN

## 2021-04-11 MED ORDER — LABETALOL HCL 5 MG/ML IV SOLN
INTRAVENOUS | Status: AC
  Filled 2021-04-11: qty 4

## 2021-04-11 MED ORDER — PHENOL 1.4 % MT LIQD: 1.0000 | OROMUCOSAL | Status: DC | PRN

## 2021-04-11 MED ORDER — ROCURONIUM BROMIDE 10 MG/ML (PF) SYRINGE
PREFILLED_SYRINGE | INTRAVENOUS | Status: DC | PRN
  Administered 2021-04-11: 50 mg via INTRAVENOUS
  Administered 2021-04-11 (×2): 20 mg via INTRAVENOUS

## 2021-04-11 MED ORDER — SUGAMMADEX SODIUM 200 MG/2ML IV SOLN
INTRAVENOUS | Status: DC | PRN
  Administered 2021-04-11: 200 mg via INTRAVENOUS

## 2021-04-11 MED ORDER — POTASSIUM CHLORIDE CRYS ER 20 MEQ PO TBCR: 20.0000 meq | EXTENDED_RELEASE_TABLET | Freq: Every day | ORAL | Status: DC | PRN

## 2021-04-11 MED ORDER — OXYCODONE-ACETAMINOPHEN 5-325 MG PO TABS
1.0000 | ORAL_TABLET | ORAL | Status: DC | PRN
  Administered 2021-04-11 – 2021-04-13 (×7): 2 via ORAL
  Filled 2021-04-11 (×7): qty 2

## 2021-04-11 MED ORDER — PHENYLEPHRINE HCL-NACL 20-0.9 MG/250ML-% IV SOLN
INTRAVENOUS | Status: DC | PRN
  Administered 2021-04-11: 20 ug/min via INTRAVENOUS

## 2021-04-11 MED ORDER — CHLORHEXIDINE GLUCONATE 0.12 % MT SOLN
15.0000 mL | Freq: Once | OROMUCOSAL | Status: AC
  Administered 2021-04-11: 15 mL via OROMUCOSAL
  Filled 2021-04-11: qty 15

## 2021-04-11 MED ORDER — MIDAZOLAM HCL 2 MG/2ML IJ SOLN
INTRAMUSCULAR | Status: DC | PRN
  Administered 2021-04-11: 2 mg via INTRAVENOUS

## 2021-04-11 MED ORDER — SODIUM CHLORIDE 0.9 % IV SOLN: INTRAVENOUS | Status: DC

## 2021-04-11 MED ORDER — MORPHINE SULFATE (PF) 2 MG/ML IV SOLN: 2.0000 mg | INTRAVENOUS | Status: DC | PRN

## 2021-04-11 MED ORDER — SODIUM CHLORIDE 0.9 % IV SOLN
500.0000 mL | Freq: Once | INTRAVENOUS | Status: DC | PRN
Start: 2021-04-11 — End: 2021-04-13

## 2021-04-11 MED ORDER — FENTANYL CITRATE (PF) 250 MCG/5ML IJ SOLN
INTRAMUSCULAR | Status: AC
  Filled 2021-04-11: qty 5

## 2021-04-11 MED ORDER — CEFAZOLIN SODIUM-DEXTROSE 2-4 GM/100ML-% IV SOLN
2.0000 g | Freq: Three times a day (TID) | INTRAVENOUS | Status: AC
  Administered 2021-04-11 – 2021-04-12 (×2): 2 g via INTRAVENOUS
  Filled 2021-04-11 (×3): qty 100

## 2021-04-11 MED ORDER — HEPARIN SODIUM (PORCINE) 1000 UNIT/ML IJ SOLN
INTRAMUSCULAR | Status: DC | PRN
  Administered 2021-04-11: 9000 [IU] via INTRAVENOUS
  Administered 2021-04-11: 3000 [IU] via INTRAVENOUS

## 2021-04-11 MED ORDER — PANTOPRAZOLE SODIUM 40 MG PO TBEC
40.0000 mg | DELAYED_RELEASE_TABLET | Freq: Every day | ORAL | Status: DC
  Administered 2021-04-11 – 2021-04-13 (×3): 40 mg via ORAL
  Filled 2021-04-11 (×3): qty 1

## 2021-04-11 MED ORDER — GUAIFENESIN-DM 100-10 MG/5ML PO SYRP: 15.0000 mL | ORAL_SOLUTION | ORAL | Status: DC | PRN

## 2021-04-11 MED ORDER — FENTANYL CITRATE (PF) 100 MCG/2ML IJ SOLN: 25.0000 ug | INTRAMUSCULAR | Status: DC | PRN

## 2021-04-11 MED ORDER — ONDANSETRON HCL 4 MG/2ML IJ SOLN
INTRAMUSCULAR | Status: DC | PRN
  Administered 2021-04-11: 4 mg via INTRAVENOUS

## 2021-04-11 MED ORDER — HYDRALAZINE HCL 20 MG/ML IJ SOLN
5.0000 mg | INTRAMUSCULAR | Status: DC | PRN
Start: 2021-04-11 — End: 2021-04-13

## 2021-04-11 MED ORDER — CEFAZOLIN SODIUM-DEXTROSE 2-4 GM/100ML-% IV SOLN
2.0000 g | INTRAVENOUS | Status: AC
  Administered 2021-04-11: 2 g via INTRAVENOUS
  Filled 2021-04-11: qty 100

## 2021-04-11 MED ORDER — AMISULPRIDE (ANTIEMETIC) 5 MG/2ML IV SOLN: 10.0000 mg | Freq: Once | INTRAVENOUS | Status: DC | PRN

## 2021-04-11 MED ORDER — DEXAMETHASONE SODIUM PHOSPHATE 10 MG/ML IJ SOLN
INTRAMUSCULAR | Status: AC
  Filled 2021-04-11: qty 2

## 2021-04-11 MED ORDER — FENTANYL CITRATE (PF) 100 MCG/2ML IJ SOLN
25.0000 ug | INTRAMUSCULAR | Status: DC | PRN
  Administered 2021-04-11: 50 ug via INTRAVENOUS
  Administered 2021-04-11 (×2): 25 ug via INTRAVENOUS

## 2021-04-11 MED ORDER — IODIXANOL 320 MG/ML IV SOLN
INTRAVENOUS | Status: DC | PRN
  Administered 2021-04-11: 28 mL via INTRA_ARTERIAL

## 2021-04-11 MED ORDER — LACTATED RINGERS IV SOLN: INTRAVENOUS | Status: DC

## 2021-04-11 MED ORDER — CLOPIDOGREL BISULFATE 75 MG PO TABS
75.0000 mg | ORAL_TABLET | Freq: Every day | ORAL | Status: DC
  Administered 2021-04-12 – 2021-04-13 (×2): 75 mg via ORAL
  Filled 2021-04-11 (×2): qty 1

## 2021-04-11 MED ORDER — ASPIRIN EC 81 MG PO TBEC
81.0000 mg | DELAYED_RELEASE_TABLET | Freq: Every day | ORAL | Status: DC
  Administered 2021-04-11 – 2021-04-13 (×3): 81 mg via ORAL
  Filled 2021-04-11 (×3): qty 1

## 2021-04-11 MED ORDER — LIDOCAINE 2% (20 MG/ML) 5 ML SYRINGE
INTRAMUSCULAR | Status: AC
  Filled 2021-04-11: qty 10

## 2021-04-11 MED ORDER — ROCURONIUM BROMIDE 10 MG/ML (PF) SYRINGE
PREFILLED_SYRINGE | INTRAVENOUS | Status: AC
  Filled 2021-04-11: qty 20

## 2021-04-11 MED ORDER — DOCUSATE SODIUM 100 MG PO CAPS
100.0000 mg | ORAL_CAPSULE | Freq: Every day | ORAL | Status: DC
  Administered 2021-04-12 – 2021-04-13 (×2): 100 mg via ORAL
  Filled 2021-04-11 (×2): qty 1

## 2021-04-11 MED ORDER — ACETAMINOPHEN 500 MG PO TABS: 1000.0000 mg | ORAL_TABLET | Freq: Once | ORAL | Status: DC

## 2021-04-11 MED ORDER — 0.9 % SODIUM CHLORIDE (POUR BTL) OPTIME
TOPICAL | Status: DC | PRN
  Administered 2021-04-11 (×2): 1000 mL

## 2021-04-11 MED ORDER — EPHEDRINE 5 MG/ML INJ
INTRAVENOUS | Status: AC
  Filled 2021-04-11: qty 5

## 2021-04-11 MED ORDER — DEXAMETHASONE SODIUM PHOSPHATE 10 MG/ML IJ SOLN
INTRAMUSCULAR | Status: DC | PRN
  Administered 2021-04-11: 5 mg via INTRAVENOUS

## 2021-04-11 MED ORDER — HEMOSTATIC AGENTS (NO CHARGE) OPTIME
TOPICAL | Status: DC | PRN
  Administered 2021-04-11: 1 via TOPICAL

## 2021-04-11 MED ORDER — HEPARIN SODIUM (PORCINE) 5000 UNIT/ML IJ SOLN
5000.0000 [IU] | Freq: Three times a day (TID) | INTRAMUSCULAR | Status: DC
  Administered 2021-04-12 – 2021-04-13 (×4): 5000 [IU] via SUBCUTANEOUS
  Filled 2021-04-11 (×4): qty 1

## 2021-04-11 MED ORDER — ONDANSETRON HCL 4 MG/2ML IJ SOLN
INTRAMUSCULAR | Status: AC
  Filled 2021-04-11: qty 4

## 2021-04-11 MED ORDER — ACETAMINOPHEN 325 MG PO TABS: 325.0000 mg | ORAL_TABLET | ORAL | Status: DC | PRN

## 2021-04-11 MED ORDER — ACETAMINOPHEN 650 MG RE SUPP: 325.0000 mg | RECTAL | Status: DC | PRN

## 2021-04-11 MED ORDER — ORAL CARE MOUTH RINSE: 15.0000 mL | Freq: Once | OROMUCOSAL | Status: AC

## 2021-04-11 MED ORDER — MIDAZOLAM HCL 2 MG/2ML IJ SOLN
INTRAMUSCULAR | Status: AC
  Filled 2021-04-11: qty 2

## 2021-04-11 MED ORDER — PROPOFOL 10 MG/ML IV BOLUS
INTRAVENOUS | Status: DC | PRN
  Administered 2021-04-11: 150 mg via INTRAVENOUS

## 2021-04-11 MED ORDER — METOPROLOL TARTRATE 5 MG/5ML IV SOLN: 2.0000 mg | INTRAVENOUS | Status: DC | PRN

## 2021-04-11 MED ORDER — BENZOCAINE 10 % MT GEL
1.0000 "application " | Freq: Four times a day (QID) | OROMUCOSAL | Status: DC | PRN
  Filled 2021-04-11: qty 9

## 2021-04-11 MED ORDER — FENTANYL CITRATE (PF) 250 MCG/5ML IJ SOLN
INTRAMUSCULAR | Status: DC | PRN
  Administered 2021-04-11: 100 ug via INTRAVENOUS
  Administered 2021-04-11: 25 ug via INTRAVENOUS
  Administered 2021-04-11: 50 ug via INTRAVENOUS
  Administered 2021-04-11: 25 ug via INTRAVENOUS
  Administered 2021-04-11: 50 ug via INTRAVENOUS

## 2021-04-11 SURGICAL SUPPLY — 75 items
ADH SKN CLS APL DERMABOND .7 (GAUZE/BANDAGES/DRESSINGS) ×1
BAG BANDED W/RUBBER/TAPE 36X54 (MISCELLANEOUS) ×4 IMPLANT
BAG COUNTER SPONGE SURGICOUNT (BAG) ×4 IMPLANT
BAG EQP BAND 135X91 W/RBR TAPE (MISCELLANEOUS) ×2
BAG ISL DRAPE 18X18 STRL (DRAPES) ×1
BAG ISOLATION DRAPE 18X18 (DRAPES) ×3 IMPLANT
BALLN MUSTANG 6.0X40 75 (BALLOONS) ×4
BALLOON MUSTANG 6.0X40 75 (BALLOONS) ×3 IMPLANT
BNDG ELASTIC 4X5.8 VLCR STR LF (GAUZE/BANDAGES/DRESSINGS) IMPLANT
BNDG GAUZE ELAST 4 BULKY (GAUZE/BANDAGES/DRESSINGS) IMPLANT
CANISTER SUCT 3000ML PPV (MISCELLANEOUS) ×4 IMPLANT
CATH BEACON 5 .035 65 KMP TIP (CATHETERS) ×4 IMPLANT
CATH OMNI FLUSH 5F 65CM (CATHETERS) IMPLANT
CLIP VESOCCLUDE MED 24/CT (CLIP) ×4 IMPLANT
CLIP VESOCCLUDE SM WIDE 24/CT (CLIP) ×4 IMPLANT
COVER DOME SNAP 22 D (MISCELLANEOUS) ×4 IMPLANT
DERMABOND ADVANCED (GAUZE/BANDAGES/DRESSINGS) ×1
DERMABOND ADVANCED .7 DNX12 (GAUZE/BANDAGES/DRESSINGS) ×3 IMPLANT
DRAIN CHANNEL 15F RND FF W/TCR (WOUND CARE) IMPLANT
DRAPE HALF SHEET 40X57 (DRAPES) ×8 IMPLANT
DRAPE ISOLATION BAG 18X18 (DRAPES) ×4
DRAPE WARM FLUID 44X44 (DRAPES) ×4 IMPLANT
DRSG EMULSION OIL 3X3 NADH (GAUZE/BANDAGES/DRESSINGS) IMPLANT
ELECT REM PT RETURN 9FT ADLT (ELECTROSURGICAL) ×4
ELECTRODE REM PT RTRN 9FT ADLT (ELECTROSURGICAL) ×3 IMPLANT
EVACUATOR SILICONE 100CC (DRAIN) IMPLANT
GAUZE SPONGE 4X4 12PLY STRL (GAUZE/BANDAGES/DRESSINGS) ×4 IMPLANT
GLIDEWIRE ADV .035X180CM (WIRE) ×4 IMPLANT
GLIDEWIRE ADV .035X260CM (WIRE) ×4 IMPLANT
GLOVE SRG 8 PF TXTR STRL LF DI (GLOVE) ×3 IMPLANT
GLOVE SURG ENC MOIS LTX SZ7.5 (GLOVE) ×4 IMPLANT
GLOVE SURG UNDER POLY LF SZ8 (GLOVE) ×4
GOWN STRL REUS W/ TWL LRG LVL3 (GOWN DISPOSABLE) ×9 IMPLANT
GOWN STRL REUS W/ TWL XL LVL3 (GOWN DISPOSABLE) ×6 IMPLANT
GOWN STRL REUS W/TWL LRG LVL3 (GOWN DISPOSABLE) ×12
GOWN STRL REUS W/TWL XL LVL3 (GOWN DISPOSABLE) ×8
HEMOSTAT SPONGE AVITENE ULTRA (HEMOSTASIS) IMPLANT
KIT BASIN OR (CUSTOM PROCEDURE TRAY) ×4 IMPLANT
KIT ENCORE 26 ADVANTAGE (KITS) ×4 IMPLANT
KIT MICROPUNCTURE NIT STIFF (SHEATH) ×4 IMPLANT
KIT TURNOVER KIT B (KITS) ×4 IMPLANT
LOOP VESSEL MINI RED (MISCELLANEOUS) ×4 IMPLANT
NS IRRIG 1000ML POUR BTL (IV SOLUTION) ×8 IMPLANT
PACK ENDO MINOR (CUSTOM PROCEDURE TRAY) IMPLANT
PACK GENERAL/GYN (CUSTOM PROCEDURE TRAY) IMPLANT
PACK PERIPHERAL VASCULAR (CUSTOM PROCEDURE TRAY) ×4 IMPLANT
PAD ARMBOARD 7.5X6 YLW CONV (MISCELLANEOUS) IMPLANT
PATCH VASC XENOSURE 1CMX6CM (Vascular Products) ×4 IMPLANT
PATCH VASC XENOSURE 1X6 (Vascular Products) ×3 IMPLANT
SET MICROPUNCTURE 5F STIFF (MISCELLANEOUS) IMPLANT
SHEATH PINNACLE 5F 10CM (SHEATH) IMPLANT
SHEATH PINNACLE 6F 10CM (SHEATH) ×4 IMPLANT
SHEATH PINNACLE 8F 10CM (SHEATH) IMPLANT
STENT INNOVA 7X40X130 (Permanent Stent) ×4 IMPLANT
STOPCOCK 4 WAY LG BORE MALE ST (IV SETS) ×4 IMPLANT
SUT ETHILON 3 0 PS 1 (SUTURE) IMPLANT
SUT MNCRL AB 4-0 PS2 18 (SUTURE) ×4 IMPLANT
SUT PROLENE 5 0 C 1 24 (SUTURE) ×32 IMPLANT
SUT PROLENE 6 0 BV (SUTURE) ×20 IMPLANT
SUT VIC AB 2-0 CT1 27 (SUTURE) ×4
SUT VIC AB 2-0 CT1 TAPERPNT 27 (SUTURE) ×3 IMPLANT
SUT VIC AB 3-0 SH 27 (SUTURE) ×4
SUT VIC AB 3-0 SH 27X BRD (SUTURE) ×3 IMPLANT
SYR 10ML LL (SYRINGE) ×8 IMPLANT
SYR MEDRAD MARK V 150ML (SYRINGE) IMPLANT
TOWEL GREEN STERILE (TOWEL DISPOSABLE) ×8 IMPLANT
TRAY FOLEY MTR SLVR 16FR STAT (SET/KITS/TRAYS/PACK) ×4 IMPLANT
TUBING EXTENTION W/L.L. (IV SETS) ×4 IMPLANT
TUBING HIGH PRESSURE 120CM (CONNECTOR) IMPLANT
UNDERPAD 30X36 HEAVY ABSORB (UNDERPADS AND DIAPERS) ×4 IMPLANT
WATER STERILE IRR 1000ML POUR (IV SOLUTION) ×4 IMPLANT
WIRE AMPLATZ SS-J .035X180CM (WIRE) IMPLANT
WIRE AMPLATZ SS-J .035X260CM (WIRE) IMPLANT
WIRE BENTSON .035X145CM (WIRE) IMPLANT
WIRE STARTER BENTSON 035X150 (WIRE) ×4 IMPLANT

## 2021-04-11 NOTE — Anesthesia Procedure Notes (Signed)
Arterial Line Insertion Start/End8/04/2021 9:25 AM, 04/11/2021 9:35 AM Performed by: Moshe Salisbury, CRNA, CRNA  Patient location: Pre-op. Preanesthetic checklist: patient identified, IV checked, site marked, risks and benefits discussed, surgical consent, monitors and equipment checked, pre-op evaluation, timeout performed and anesthesia consent Lidocaine 1% used for infiltration Left, radial was placed Catheter size: 20 G Hand hygiene performed , maximum sterile barriers used  and Seldinger technique used  Attempts: 1 Procedure performed without using ultrasound guided technique. Following insertion, dressing applied and Biopatch. Post procedure assessment: normal  Patient tolerated the procedure well with no immediate complications.

## 2021-04-11 NOTE — H&P (Signed)
History and Physical Interval Note:  04/11/2021 9:56 AM  Arthur Mcmahon  has presented today for surgery, with the diagnosis of COMMON FEMORAL ARTERY STENOSIS, PAD.  The various methods of treatment have been discussed with the patient and family. After consideration of risks, benefits and other options for treatment, the patient has consented to  Procedure(s): LEFT FEMORAL ENDARTERECTOMY (Left) INSERTION OF LEFT POPLITEAL STENT (Left) LEFT SECOND TOE AMPUTATION D (Left) as a surgical intervention.  The patient's history has been reviewed, patient examined, no change in status, stable for surgery.  I have reviewed the patient's chart and labs.  Questions were answered to the patient's satisfaction.    Left femoral endarterectomy and possible popliteal intervention.  Will defer toe amputation for now and see if this improves after revasculatization.  [name]  Patient name: Arthur Mcmahon            MRN: [id number]        DOB: 03-Apr-1966          Sex: male   REASON FOR CONSULT: Evaluate for PAD   HPI: Arthur Mcmahon is a 55 y.o. male, with history of hypertension, tobacco abuse, EtOH abuse that presents for evaluation of PAD in the left foot.  Patient states he got a discolored left second toe that started about 3 weeks ago.  This has been very painful and keeps him awake at night.  He has had no previous lower extremity vascular interventions.  Feels the bottom of the foot hurts as well.  Has also been very concerned about bilateral leg swelling.  He does smoke about a half a pack a day.  States he was taking an aspirin but stopped this recently.  Denies any knowledge of arrhythmia etc.       Past Medical History:  Diagnosis Date   ETOH abuse     Hypertension             Past Surgical History:  Procedure Laterality Date   FRACTURE SURGERY       TONSILLECTOMY               Family History  Problem Relation Age of Onset   Cancer Mother     Heart failure Mother     Cancer  Father        SOCIAL HISTORY: Social History         Socioeconomic History   Marital status: Divorced      Spouse name: Not on file   Number of children: Not on file   Years of education: Not on file   Highest education level: Not on file  Occupational History   Not on file  Tobacco Use   Smoking status: Every Day      Packs/day: 1.50      Years: 25.00      Pack years: 37.50      Types: Cigarettes   Smokeless tobacco: Never  Vaping Use   Vaping Use: Never used  Substance and Sexual Activity   Alcohol use: Yes      Comment: daily   Drug use: Yes      Types: Marijuana   Sexual activity: Not on file  Other Topics Concern   Not on file  Social History Narrative   Not on file    Social Determinants of Health    Financial Resource Strain: Not on file  Food Insecurity: Not on file  Transportation Needs: Not on file  Physical Activity: Not  on file  Stress: Not on file  Social Connections: Not on file  Intimate Partner Violence: Not on file      No Known Allergies         Current Outpatient Medications  Medication Sig Dispense Refill   naproxen (NAPROSYN) 500 MG tablet Take 1 tablet (500 mg total) by mouth 2 (two) times daily with a meal. 14 tablet 0   acetaminophen (TYLENOL) 500 MG tablet Take 500 mg by mouth every 6 (six) hours as needed for mild pain, fever or headache. (Patient not taking: Reported on 03/29/2021)       oxyCODONE-acetaminophen (PERCOCET/ROXICET) 5-325 MG tablet Take 1 tablet by mouth every 6 (six) hours as needed for severe pain. (Patient not taking: Reported on 03/29/2021) 10 tablet 0    No current facility-administered medications for this visit.      REVIEW OF SYSTEMS:  '[X]'$  denotes positive finding, '[ ]'$  denotes negative finding Cardiac   Comments:  Chest pain or chest pressure:      Shortness of breath upon exertion:      Short of breath when lying flat:      Irregular heart rhythm:             Vascular      Pain in calf, thigh, or hip  brought on by ambulation:      Pain in feet at night that wakes you up from your sleep:      Blood clot in your veins:      Leg swelling:  x           Pulmonary      Oxygen at home:      Productive cough:      Wheezing:             Neurologic      Sudden weakness in arms or legs:      Sudden numbness in arms or legs:      Sudden onset of difficulty speaking or slurred speech:      Temporary loss of vision in one eye:      Problems with dizziness:             Gastrointestinal      Blood in stool:      Vomited blood:             Genitourinary      Burning when urinating:      Blood in urine:             Psychiatric      Major depression:             Hematologic      Bleeding problems:      Problems with blood clotting too easily:             Skin      Rashes or ulcers:             Constitutional      Fever or chills:          PHYSICAL EXAM:    Vitals:    03/29/21 1521  BP: (!) 161/94  Pulse: 85  Resp: 16  Temp: 97.9 F (36.6 C)  TempSrc: Temporal  SpO2: 98%  Weight: 196 lb (88.9 kg)  Height: '6\' 2"'$  (1.88 m)      GENERAL: The patient is a well-nourished male, in no acute distress. The vital signs are documented above. CARDIAC: There is a regular rate and rhythm. VASCULAR:  Palpable femoral pulses bilaterally No palpable pedal pulses but DP PT Doppler signals and left foot appears motor intact and sensory intact Left second toe appears ischemic PULMONARY: There is good air exchange bilaterally without wheezing or rales. ABDOMEN: Soft and non-tender MUSCULOSKELETAL: There are no major deformities or cyanosis. NEUROLOGIC: No focal weakness or paresthesias are detected. SKIN: There are no ulcers or rashes noted. PSYCHIATRIC: The patient has a normal affect.           DATA:    ABIs 03/22/21 0.52 on the right and 0.9 on the left   Assessment/Plan:   55 year old male presents for evaluation of discolored left second toe with likely underlying  peripheral arterial disease.  Unclear if this is chronic peripheral arterial disease given his risk factors or more acute atheroembolic event.  He describes fairly acute history 3 weeks ago but clearly has peripheral vascular disease based on his other lower extremity.  His ABIs from several weeks ago were 0.9 on the left biphasic.  I cannot appreciate any pedal pulse on exam and he does have a fair amount of swelling with likely some underlying venous insufficiency as well.  I have recommended aortogram with left leg arteriogram and possible intervention.  Discussed he may ultimately require toe amputation if this progresses.  We will get him scheduled for this Thursday.  I have asked that he restart an 81 mg aspirin daily.     [name], MD [facility] [place] Office: [phone]

## 2021-04-11 NOTE — Anesthesia Preprocedure Evaluation (Signed)
Anesthesia Evaluation  Patient identified by MRN, date of birth, ID band Patient awake    Reviewed: Allergy & Precautions, NPO status , Patient's Chart, lab work & pertinent test results  Airway Mallampati: II  TM Distance: >3 FB Neck ROM: Full    Dental  (+) Dental Advisory Given   Pulmonary Current Smoker,    breath sounds clear to auscultation       Cardiovascular hypertension, Pt. on medications + Peripheral Vascular Disease   Rhythm:Regular Rate:Normal     Neuro/Psych negative neurological ROS     GI/Hepatic negative GI ROS, Neg liver ROS,   Endo/Other  negative endocrine ROS  Renal/GU negative Renal ROS     Musculoskeletal   Abdominal   Peds  Hematology  (+) anemia ,   Anesthesia Other Findings   Reproductive/Obstetrics                             Lab Results  Component Value Date   WBC 9.0 03/22/2021   HGB 15.6 03/31/2021   HCT 46.0 03/31/2021   MCV 102.9 (H) 03/22/2021   PLT 261 03/22/2021   Lab Results  Component Value Date   CREATININE 0.70 03/31/2021   BUN 3 (L) 03/31/2021   NA 136 03/31/2021   K 4.6 03/31/2021   CL 98 03/31/2021   CO2 28 03/22/2021    Anesthesia Physical Anesthesia Plan  ASA: 3  Anesthesia Plan: General   Post-op Pain Management:    Induction: Intravenous  PONV Risk Score and Plan: 1 and Ondansetron, Dexamethasone and Treatment may vary due to age or medical condition  Airway Management Planned: Oral ETT  Additional Equipment: Arterial line  Intra-op Plan:   Post-operative Plan: Extubation in OR  Informed Consent: I have reviewed the patients History and Physical, chart, labs and discussed the procedure including the risks, benefits and alternatives for the proposed anesthesia with the patient or authorized representative who has indicated his/her understanding and acceptance.     Dental advisory given  Plan Discussed with:  CRNA  Anesthesia Plan Comments:         Anesthesia Quick Evaluation

## 2021-04-11 NOTE — Anesthesia Procedure Notes (Signed)
Procedure Name: Intubation Date/Time: 04/11/2021 10:57 AM Performed by: Thelma Comp, CRNA Pre-anesthesia Checklist: Patient identified, Emergency Drugs available, Suction available and Patient being monitored Patient Re-evaluated:Patient Re-evaluated prior to induction Oxygen Delivery Method: Circle System Utilized Preoxygenation: Pre-oxygenation with 100% oxygen Induction Type: IV induction Ventilation: Mask ventilation without difficulty Laryngoscope Size: Mac and 4 Grade View: Grade II Tube type: Oral Tube size: 7.5 mm Number of attempts: 1 Airway Equipment and Method: Stylet and Oral airway Placement Confirmation: ETT inserted through vocal cords under direct vision, positive ETCO2 and breath sounds checked- equal and bilateral Secured at: 23 cm Tube secured with: Tape Dental Injury: Teeth and Oropharynx as per pre-operative assessment

## 2021-04-11 NOTE — Op Note (Signed)
Date: April 11, 2021  Preoperative diagnosis: Critical limb ischemia of the left lower extremity with tissue loss (left 2nd toe)  Postoperative diagnosis: Same  Procedure: 1.  Left common femoral endarterectomy with profundoplasty and bovine pericardial patch angioplasty 2.  Left lower extremity arteriogram with selection of third order branches 3.  Left above-knee popliteal angioplasty with stent placement (stented with 7 mm x 40 mm self-expanding Innova postdilated with a 6 mm x 40 mm Mustang)  Surgeon: Dr. [name], MD  Assistant: Risa Grill, PA  Indications: Patient is a 55 year old male who was seen with left lower extremity tissue loss in the left second toe.  He underwent arteriogram recently that showed high-grade left common femoral artery stenosis with a second high-grade above-knee popliteal artery stenosis.  He presents today for left common femoral endarterectomy with plans for hybrid above-knee popliteal artery angioplasty and stenting after risk benefits discussed.  Findings: Left common femoral endarterectomy was performed with eversion endarterectomy of the profunda.  There were two large calcified plaques that were removed as part of the endarterectomy in the left common femoral artery.  Bovine pericardial patch was sewn and there was a good left femoral pulse.  I then stuck the patch antegrade and got across the left above-knee popliteal high-grade stenosis with my wire into the left peroneal artery.  The lesion in the left above-knee popliteal artery with primarily stented with a 7 mm x 40 mm self-expanding Innova postdilated with a 6 mm x 40 mm angioplasty balloon.  Excellent Doppler signals in the foot at completion.  Anesthesia: General  EBL: Less than 200 mL  Details: Patient was taken to the operating room after informed consent was obtained.  Placed on the operative table in supine position.  General endotracheal anesthesia was induced.  Bilateral  groins and left leg were then prepped draped in usual sterile fashion.  Antibiotics were given and timeout was performed.  Initially made a horizontal groin incision in the left groin just above the inguinal crease.  Dissected down with Bovie cautery and opened the femoral sheath in longitudinal fashion dissected out the common femoral as well as its branches including SFA and profunda and all these were controlled Vesseloops.  100 units/kg IV heparin was given and ACT was checked to maintain greater than 250.  I then initially controlled the SFA and profunda with Vesseloops and the distal external iliac with a Henley clamp.  The common femoral was opened with 11 blade scalpel extended with Potts scissors.  Endarterectomy was then performed of the common femoral with eversion endarterectomy of the profunda and I stopped the endarterectomy in the proximal SFA where I met a good endpoint.  I did have some difficulty getting a good endpoint in the profunda.  After endarterectomy, I did have to repair the profunda with multiple 6-0 Prolene's after endarterectomy given this was thin.  I then had good pulsatile inflow and good backbleeding from the SFA and profunda.  A bovine pericardial patch was sewn 5-0 Prolene parachute technique to the left common femoral artery and the artery was de-aired prior to completion.  I then stuck the patch antegrade with a micro access needle and microwire down the SFA.  I then placed a micro sheath and then got Glidewire advantage down across the SFA and across the above-knee popliteal high-grade stenosis into the peroneal artery.  I then exchanged for a short 6 [place] sheath.  I got a left leg angiogram to identify the lesion in the  above-knee popliteal artery.  I primarily stented this with a 7 mm x 40 mm self-expanding Innova that was placed antegrade through the 6 French sheath over the glifewire advantage.  I then postdilated the stent with a 6 mm x 40 mm balloon.  There was no  residual stenosis with excellent runoff in the SFA and preserved three-vessel runoff.  That point in time wires and catheters were removed and I placed a 5-0 Prolene in the arteriotomy in the patch where the sheath was placed.  Protamine was given for reversal after we got good signals in the foot.  I irrigated groin incision and used Surgicel snow for hemostasis.  I then closed the groin in multiple layers of 2-0 Vicryl, 3-0 Vicryl, 4-0 Monocryl and Dermabond.  Complication: None  Condition: Stable  [name], MD [facility] [place] Office: [place]

## 2021-04-11 NOTE — Transfer of Care (Signed)
Immediate Anesthesia Transfer of Care Note  Patient: Arthur Mcmahon  Procedure(s) Performed: LEFT FEMORAL ENDARTERECTOMY (Left: Groin) INSERTION OF LEFT ABOVE KNEE POPLITEAL STENT (Left: Leg Upper) PATCH ANGIOPLASTY LEFT COMMON FEMORAL ARTERY, PROFUNDOPLASTY (Left: Groin) INTRA OPERATIVE ARTERIOGRAM (Left: Leg Upper)  Patient Location: PACU  Anesthesia Type:General  Level of Consciousness: drowsy and patient cooperative  Airway & Oxygen Therapy: Patient Spontanous Breathing and Patient connected to face mask oxygen  Post-op Assessment: Report given to RN and Post -op Vital signs reviewed and stable  Post vital signs: Reviewed and stable  Last Vitals:  Vitals Value Taken Time  BP 158/93 04/11/21 1418  Temp    Pulse 91 04/11/21 1420  Resp 12 04/11/21 1420  SpO2 99 % 04/11/21 1420  Vitals shown include unvalidated device data.  Last Pain:  Vitals:   04/11/21 0853  TempSrc:   PainSc: 7       Patients Stated Pain Goal: 2 ([id number])  Complications: No notable events documented.

## 2021-04-11 NOTE — Anesthesia Postprocedure Evaluation (Signed)
Anesthesia Post Note  Patient: Arthur Mcmahon  Procedure(s) Performed: LEFT FEMORAL ENDARTERECTOMY (Left: Groin) INSERTION OF LEFT ABOVE KNEE POPLITEAL STENT (Left: Leg Upper) PATCH ANGIOPLASTY LEFT COMMON FEMORAL ARTERY, PROFUNDOPLASTY (Left: Groin) INTRA OPERATIVE ARTERIOGRAM (Left: Leg Upper)     Patient location during evaluation: PACU Anesthesia Type: General Level of consciousness: awake and alert Pain management: pain level controlled Vital Signs Assessment: post-procedure vital signs reviewed and stable Respiratory status: spontaneous breathing, nonlabored ventilation, respiratory function stable and patient connected to nasal cannula oxygen Cardiovascular status: blood pressure returned to baseline and stable Postop Assessment: no apparent nausea or vomiting Anesthetic complications: no   No notable events documented.  Last Vitals:  Vitals:   04/11/21 1450 04/11/21 1505  BP: (!) 148/87 (!) 154/93  Pulse: 84 83  Resp: 12 13  Temp:    SpO2: 97% 97%    Last Pain:  Vitals:   04/11/21 1505  TempSrc:   PainSc: Asleep                 Tiajuana Amass

## 2021-04-12 ENCOUNTER — Encounter (HOSPITAL_COMMUNITY): Payer: Self-pay | Admitting: Vascular Surgery

## 2021-04-12 LAB — LIPID PANEL
Cholesterol: 81 mg/dL (ref 0–200)
HDL: 31 mg/dL — ABNORMAL LOW (ref >40)
LDL Cholesterol: 36 mg/dL (ref 0–99)
Total CHOL/HDL Ratio: 2.6 RATIO
Triglycerides: 70 mg/dL (ref <150)
VLDL: 14 mg/dL (ref 0–40)

## 2021-04-12 LAB — BASIC METABOLIC PANEL
Anion gap: 3 — ABNORMAL LOW (ref 5–15)
BUN: 5 mg/dL — ABNORMAL LOW (ref 6–20)
CO2: 29 mmol/L (ref 22–32)
Calcium: 8.4 mg/dL — ABNORMAL LOW (ref 8.9–10.3)
Chloride: 100 mmol/L (ref 98–111)
Creatinine, Ser: 0.68 mg/dL (ref 0.61–1.24)
GFR, Estimated: >60 mL/min (ref >60)
Glucose, Bld: 121 mg/dL — ABNORMAL HIGH (ref 70–99)
Potassium: 4.3 mmol/L (ref 3.5–5.1)
Sodium: 132 mmol/L — ABNORMAL LOW (ref 135–145)

## 2021-04-12 LAB — CBC
HCT: 37.7 % — ABNORMAL LOW (ref 39.0–52.0)
Hemoglobin: 12.8 g/dL — ABNORMAL LOW (ref 13.0–17.0)
MCH: 33.3 pg (ref 26.0–34.0)
MCHC: 34 g/dL (ref 30.0–36.0)
MCV: 98.2 fL (ref 80.0–100.0)
Platelets: 250 10*3/uL (ref 150–400)
RBC: 3.84 MIL/uL — ABNORMAL LOW (ref 4.22–5.81)
RDW: 11.9 % (ref 11.5–15.5)
WBC: 10.7 10*3/uL — ABNORMAL HIGH (ref 4.0–10.5)
nRBC: 0 % (ref 0.0–0.2)

## 2021-04-12 NOTE — Progress Notes (Addendum)
Progress Note    04/12/2021 7:05 AM 1 Day Post-Op  Subjective:  No complaints. Reports improvement in left foot/2nd toe pain.   Vitals:   04/11/21 2300 04/12/21 0500  BP: 118/76 119/72  Pulse: 78 81  Resp: 16 18  Temp: 97.9 F (36.6 C) 97.9 F (36.6 C)  SpO2: 96% 94%    Physical Exam: General appearance: Awake, alert in no apparent distress Cardiac: Heart rate and rhythm are regular Respirations: Nonlabored Incisions: Left groin  incision well approximated without bleeding or hematoma Extremities: Moderate left lower leg/ankle/foot edema. Both feet are warm with intact sensation and motor function.  Ischemic changes noted to left second toe unchanged. Pulse/Doppler exam:  Brisk left dorsalis pedis, posterior tibial artery Doppler signals      CBC    Component Value Date/Time   WBC 10.7 (H) 04/12/2021 0400   RBC 3.84 (L) 04/12/2021 0400   HGB 12.8 (L) 04/12/2021 0400   HCT 37.7 (L) 04/12/2021 0400   PLT 250 04/12/2021 0400   MCV 98.2 04/12/2021 0400   MCH 33.3 04/12/2021 0400   MCHC 34.0 04/12/2021 0400   RDW 11.9 04/12/2021 0400   LYMPHSABS 1.7 03/22/2021 0053   MONOABS 0.4 03/22/2021 0053   EOSABS 0.1 03/22/2021 0053   BASOSABS 0.0 03/22/2021 0053    BMET    Component Value Date/Time   NA 132 (L) 04/12/2021 0400   K 4.3 04/12/2021 0400   CL 100 04/12/2021 0400   CO2 29 04/12/2021 0400   GLUCOSE 121 (H) 04/12/2021 0400   BUN 5 (L) 04/12/2021 0400   CREATININE 0.68 04/12/2021 0400   CALCIUM 8.4 (L) 04/12/2021 0400   GFRNONAA >60 04/12/2021 0400   GFRAA >60 12/01/2019 2208     Intake/Output Summary (Last 24 hours) at 04/12/2021 0705 Last data filed at 04/11/2021 2041 Gross per 24 hour  Intake 1000 ml  Output 1925 ml  Net -925 ml    HOSPITAL MEDICATIONS Scheduled Meds:  aspirin EC  81 mg Oral Daily   atorvastatin  40 mg Oral Daily   clopidogrel  75 mg Oral Q0600   docusate sodium  100 mg Oral Daily   heparin  5,000 Units Subcutaneous Q8H    pantoprazole  40 mg Oral Daily   Continuous Infusions:  sodium chloride     sodium chloride 100 mL/hr at 04/11/21 2101   magnesium sulfate bolus IVPB     PRN Meds:.sodium chloride, acetaminophen **OR** acetaminophen, alum & mag hydroxide-simeth, benzocaine, bisacodyl, guaiFENesin-dextromethorphan, hydrALAZINE, labetalol, magnesium sulfate bolus IVPB, metoprolol tartrate, morphine injection, ondansetron, oxyCODONE-acetaminophen, phenol, polyethylene glycol, potassium chloride  Assessment and Plan: POD 1 1.  Left common femoral endarterectomy with profundoplasty and bovine pericardial patch angioplasty 2.  Left lower extremity arteriogram with selection of third order branches 3.  Left above-knee popliteal angioplasty with stent placement  Stable post-op. LLE well perfused. Statin, Plavix and aspirin continue. Continue current care plan/mobilization  -DVT prophylaxis:  heparin Adams   Risa Grill, PA-C [facility] [phone] 04/12/2021  7:05 AM   I have seen and evaluated the patient. I agree with the PA note as documented above.  Postop day 1 status post left common femoral endarterectomy with profundoplasty and antegrade left above-knee popliteal stent.  Groin looks good.  Palpable PT pulse at the ankle.  Plan dual antiplatelet therapy with aspirin Plavix.  PT OT mobilize today.  Will monitor left 2nd toe as outpatient.  [name], MD [facility] Office:  [phone]  

## 2021-04-12 NOTE — Therapy (Signed)
Occupational Therapy Evaluation Patient Details Name: Arthur Mcmahon MRN: [id number] DOB: [birth year] Today's Date: 04/12/2021    History of Present Illness 55 y.o male admitted 8/8 for PAD s/p Lt femoral endarterectomy with profundoplasty, insertion of Lt popliteal stent. PMHx: HTN, PAD, and tobacco use disorder   Clinical Impression   Pt presented as above w/ deficits as listed below. Pt is currently Min guard to min assist for functional mobility and transfers related to ADL/selfcare tasks. Pt is somewhat impulsive with 1 LOB backwards requiring Min A/HHA to correct. Pt was educated in role of OT and performed functional transfers today in preparation for increased independence with ADL's. He was also educated verbally in tub bench/DME for safety. He should benefit from acute OT to assist in maximizing independence for return home with PRN roommate assist. Consider tub transfer using bench vs chair next visit.    Follow Up Recommendations  No OT follow up;Supervision - Intermittent (Discussed HHOT, pt currently declines needs. Stating roommates are in and out and able to assist PRN.)    Equipment Recommendations  Tub/shower bench    Recommendations for Other Services PT consult     Precautions / Restrictions Precautions Precautions: Fall Restrictions Weight Bearing Restrictions: No      Mobility Bed Mobility Overal bed mobility: Needs Assistance Bed Mobility: Sidelying to Sit;Supine to Sit   Sidelying to sit: Supervision;HOB elevated Supine to sit: Supervision;HOB elevated     General bed mobility comments: in chair on arrival    Transfers Overall transfer level: Needs assistance Equipment used: Rolling walker (2 wheeled) Transfers: Sit to/from [employer] Sit to Stand: Min assist;From elevated surface Stand pivot transfers: Min assist       General transfer comment: Pt required multimodal cues for safety and 1 LOB backwards due to standing prior to  therapist being ready despite commands to wait for RW/therapist.    Balance Overall balance assessment: Needs assistance Sitting-balance support: No upper extremity supported;Feet supported Sitting balance-Leahy Scale: Fair     Standing balance support: No upper extremity supported;Bilateral upper extremity supported Standing balance-Leahy Scale: Poor Standing balance comment: Pt w/ LOB backwards requiring Min A/HHA to correct. Pt didn't feel as though he needed RW.        ADL either performed or assessed with clinical judgement   ADL Overall ADL's : Needs assistance/impaired Eating/Feeding: Independent   Grooming: Set up;Sitting   Upper Body Bathing: Set up;Sitting   Lower Body Bathing: Sitting/lateral leans;Sit to/from stand;Minimal assistance   Upper Body Dressing : Set up;Sitting   Lower Body Dressing: Minimal assistance;Sitting/lateral leans;Sit to/from stand   Toilet Transfer: Minimal assistance;Ambulation;BSC;RW;Cueing for sequencing;Cueing for safety   Toileting- Clothing Manipulation and Hygiene: Min guard;Sitting/lateral lean   Tub/ Shower Transfer:  (TBD)   Functional mobility during ADLs: Minimal assistance;Cueing for sequencing;Cueing for safety;Rolling walker General ADL Comments: Pt with some impulsiveness/decreased awareness of deficits this initial assessment. Currently requires Min A for functonal transfers and LB ADL. Tub transfers TBA. Pt would likely benefit from shower chair vs tub bench.     Vision   Vision Assessment?: No apparent visual deficits            Pertinent Vitals/Pain Pain Assessment: No/denies pain Pain Score: 2  Pain Location: Left foot Pain Descriptors / Indicators: Sore Pain Intervention(s): Limited activity within patient's tolerance;Monitored during session;Repositioned     Hand Dominance Right   Extremity/Trunk Assessment Upper Extremity Assessment Upper Extremity Assessment: Overall WFL for tasks assessed   Lower  Extremity  Assessment Lower Extremity Assessment: Generalized weakness   Cervical / Trunk Assessment Cervical / Trunk Assessment: Normal   Communication Communication Communication: No difficulties   Cognition Arousal/Alertness: Awake/alert Behavior During Therapy: WFL for tasks assessed/performed Overall Cognitive Status: Within Functional Limits for tasks assessed     General Comments: Pt somewhat impulsive during this session, mild decrease awareness of deficits. 1 LOB backwards - see note above.              Home Living Family/patient expects to be discharged to:: Private residence Living Arrangements: Non-relatives/Friends Available Help at Discharge: Friend(s);Available PRN/intermittently Type of Home: House Home Access: Stairs to enter [employer] of Steps: 2 +1 Entrance Stairs-Rails: Right;Left Home Layout: One level     Bathroom Shower/Tub: Corporate investment banker: Standard Bathroom Accessibility: Yes How Accessible: Accessible via walker Home Equipment: Grab bars - tub/shower   Additional Comments: Pt reports 1-2 roommates whom work.      Prior Functioning/Environment Level of Independence: Independent            OT Problem List: Decreased activity tolerance;Impaired balance (sitting and/or standing);Decreased strength      OT Treatment/Interventions: Self-care/ADL training;DME and/or AE instruction;Therapeutic activities;Patient/family education    OT Goals(Current goals can be found in the care plan section) Acute Rehab OT Goals Patient Stated Goal: return home and watch tv OT Goal Formulation: With patient Time For Goal Achievement: 04/26/21 Potential to Achieve Goals: Good ADL Goals Pt Will Perform Grooming: with modified independence;standing Pt Will Perform Lower Body Dressing: with set-up;with modified independence;sitting/lateral leans;sit to/from stand Pt Will Transfer to Toilet: with modified  independence;ambulating;regular height toilet;bedside commode Pt Will Perform Toileting - Clothing Manipulation and hygiene: Independently;sitting/lateral leans Pt Will Perform Tub/Shower Transfer: Tub transfer;ambulating;tub bench;rolling walker  OT Frequency: Min 2X/week   Barriers to D/C:  Pt has roommate that works during the day. He reports that roommate and roommate's brother are able to provide PRN intermittent assist.      AM-PAC OT "6 Clicks" Daily Activity     Outcome Measure Help from another person eating meals?: None Help from another person taking care of personal grooming?: A Little Help from another person toileting, which includes using toliet, bedpan, or urinal?: A Little Help from another person bathing (including washing, rinsing, drying)?: A Little Help from another person to put on and taking off regular upper body clothing?: None Help from another person to put on and taking off regular lower body clothing?: A Little 6 Click Score: 20   End of Session Equipment Utilized During Treatment: Gait belt;Rolling walker Nurse Communication: Mobility status;Other (comment) (Pt up in chair)  Activity Tolerance: Patient tolerated treatment well Patient left: in chair;with call bell/phone within reach;with chair alarm set  OT Visit Diagnosis: Unsteadiness on feet (R26.81);Other abnormalities of gait and mobility (R26.89);Muscle weakness (generalized) (M62.81)                Time: [id number] OT Time Calculation (min): 26 min Charges:  OT General Charges $OT Visit: 1 Visit OT Evaluation $OT Eval Low Complexity: 1 Low OT Treatments $Self Care/Home Management : 8-22 mins  Waverly Tarquinio Beth Dixon, OTR/L 04/12/2021, 11:49 AM

## 2021-04-12 NOTE — Progress Notes (Signed)
PT Cancellation Note  Patient Details Name: BREXTYN LINWOOD MRN: [id number] DOB: 09-14-1965   Cancelled Treatment:    Reason Eval/Treat Not Completed: Other (comment) (pt requesting temporary deferral in therapy after just eating)   Sofya Moustafa B Coburn Knaus 04/12/2021, 8:25 AM Bayard Males, PT Acute Rehabilitation Services Pager: [phone] Office: [phone]

## 2021-04-12 NOTE — Evaluation (Signed)
Physical Therapy Evaluation Patient Details Name: Arthur Mcmahon MRN: [id number] DOB: May 20, 1966 Today's Date: 04/12/2021   History of Present Illness  55 y.o male admitted 8/8 for PAD s/p Lt femoral endarterectomy with profundoplasty, insertion of Lt popliteal stent. PMHx: HTN, PAD, and tobacco use disorder  Clinical Impression  Pt pleasant and willing to mobilize. Pt with edema LLE limiting ROM and gait with cues for gait and transfers with pt benefiting from use of RW during gait. Pt able to complete stairs and HEP with education for progressive gait and exercises. PT with decreased strength, gait and functional mobility who will benefit from acute therapy to maximize mobility, safety and independence.      Follow Up Recommendations No PT follow up    Equipment Recommendations  Rolling walker with 5" wheels    Recommendations for Other Services       Precautions / Restrictions Precautions Precautions: Fall Restrictions Weight Bearing Restrictions: No      Mobility  Bed Mobility Overal bed mobility: Independent Bed Mobility: Sit to Supine   Sidelying to sit: Supervision;HOB elevated Supine to sit: Independent     General bed mobility comments: in chair on arrival, return to bed without assist    Transfers Overall transfer level: Needs assistance Equipment used: Rolling walker (2 wheeled) Transfers: Sit to/from Stand Sit to Stand: Min guard        General transfer comment: cues for hand placement  Ambulation/Gait Ambulation/Gait assistance: Min guard Gait Distance (Feet): 200 Feet Assistive device: Rolling walker (2 wheeled) Gait Pattern/deviations: Step-through pattern;Decreased stride length;Trunk flexed;Decreased dorsiflexion - left   Gait velocity interpretation: >2.62 ft/sec, indicative of community ambulatory General Gait Details: edema of LLE with decreased dorsiflexon and stance with slight imbalance with gait with increased limp without use of  rW  Stairs Stairs: Yes Stairs assistance: Modified independent (Device/Increase time) Stair Management: Alternating pattern;One rail Right;Forwards Number of Stairs: 3    Wheelchair Mobility    Modified Rankin (Stroke Patients Only)       Balance Overall balance assessment: Needs assistance Sitting-balance support: No upper extremity supported Sitting balance-Leahy Scale: Good Sitting balance - Comments: sitting without UE support   Standing balance support: No upper extremity supported;Bilateral upper extremity supported Standing balance-Leahy Scale: Fair Standing balance comment: pt able to stand without UE support and increased limp with gait needing RW                             Pertinent Vitals/Pain Pain Assessment: No/denies pain Pain Score: 2  Pain Location: Left foot Pain Descriptors / Indicators: Sore Pain Intervention(s): Limited activity within patient's tolerance;Monitored during session;Repositioned    Home Living Family/patient expects to be discharged to:: Private residence Living Arrangements: Non-relatives/Friends Available Help at Discharge: Friend(s);Available PRN/intermittently Type of Home: House Home Access: Stairs to enter Entrance Stairs-Rails: Psychiatric nurse of Steps: 2  Home Layout: One level Home Equipment: Grab bars - tub/shower Additional Comments: Pt reports 1-2 roommates who work.    Prior Function Level of Independence: Independent               Hand Dominance   Dominant Hand: Right    Extremity/Trunk Assessment   Upper Extremity Assessment Upper Extremity Assessment: Overall WFL for tasks assessed    Lower Extremity Assessment Lower Extremity Assessment: Generalized weakness    Cervical / Trunk Assessment Cervical / Trunk Assessment: Normal  Communication   Communication: No difficulties  Cognition Arousal/Alertness: Awake/alert  Behavior During Therapy: WFL for tasks  assessed/performed Overall Cognitive Status: Within Functional Limits for tasks assessed                                      General Comments      Exercises General Exercises - Lower Extremity Hip Flexion/Marching: AROM;Both;Seated;10 reps Toe Raises: AROM;Both;10 reps;Seated   Assessment/Plan    PT Assessment Patient needs continued PT services  PT Problem List Decreased balance;Decreased activity tolerance;Decreased knowledge of use of DME       PT Treatment Interventions Gait training;Therapeutic activities;Therapeutic exercise;DME instruction;Functional mobility training;Patient/family education;Balance training    PT Goals (Current goals can be found in the Care Plan section)  Acute Rehab PT Goals Patient Stated Goal: return home and watch tv PT Goal Formulation: With patient Time For Goal Achievement: 04/26/21 Potential to Achieve Goals: Good    Frequency Min 3X/week   Barriers to discharge Decreased caregiver support      Co-evaluation               AM-PAC PT "6 Clicks" Mobility  Outcome Measure Help needed turning from your back to your side while in a flat bed without using bedrails?: None Help needed moving from lying on your back to sitting on the side of a flat bed without using bedrails?: None Help needed moving to and from a bed to a chair (including a wheelchair)?: A Little Help needed standing up from a chair using your arms (e.g., wheelchair or bedside chair)?: A Little Help needed to walk in hospital room?: A Little Help needed climbing 3-5 steps with a railing? : None 6 Click Score: 21    End of Session Equipment Utilized During Treatment: Gait belt Activity Tolerance: Patient tolerated treatment well Patient left: in bed;with call bell/phone within reach Nurse Communication: Mobility status PT Visit Diagnosis: Other abnormalities of gait and mobility (R26.89)    Time: [id number] PT Time Calculation (min) (ACUTE ONLY): 22  min   Charges:   PT Evaluation $PT Eval Moderate Complexity: 1 Mod          Destani Wamser P, PT Acute Rehabilitation Services Pager: [phone] Office: [place] Lucillia Corson 04/12/2021, 12:13 PM

## 2021-04-12 NOTE — Progress Notes (Signed)
PHARMACIST LIPID MONITORING   Arthur Mcmahon is a 55 y.o. male admitted on 04/11/2021 with PAD common femoral artery stenosis s/p endarterectomy, stent, and toe amputation.  Pharmacy has been consulted to optimize lipid-lowering therapy with the indication of secondary prevention for clinical ASCVD.  Recent Labs:  Lipid Panel (last 6 months):   Lab Results  Component Value Date   CHOL 81 04/12/2021   TRIG 70 04/12/2021   HDL 31 (L) 04/12/2021   CHOLHDL 2.6 04/12/2021   VLDL 14 04/12/2021   LDLCALC 36 04/12/2021    Hepatic function panel (last 6 months):   Lab Results  Component Value Date   AST 16 04/11/2021   ALT 10 04/11/2021   ALKPHOS 68 04/11/2021   BILITOT 0.8 04/11/2021    SCr (since admission):   Serum creatinine: 0.68 mg/dL 04/12/21 0400 Estimated creatinine clearance: 122.7 mL/min  Current therapy and lipid therapy tolerance Current lipid-lowering therapy: atorvastatin 40  Previous lipid-lowering therapies (if applicable): n/a Documented or reported allergies or intolerances to lipid-lowering therapies (if applicable): none  Assessment:   Patient prefers no changes in lipid-lowering therapy at this time due to LDL at < 55   Plan:    1.Statin intensity (high intensity recommended for all patients regardless of the LDL):  No statin changes. The patient is already on a high intensity statin.  2.Add ezetimibe (if any one of the following):   Not indicated at this time.  3.Refer to lipid clinic:   No  4.Follow-up with:  Primary care provider - Delora Fuel, MD  5.Follow-up labs after discharge:  No changes in lipid therapy, repeat a lipid panel in one year.      Benetta Spar, PharmD, BCPS, BCCP Clinical Pharmacist  Please check AMION for all [place] phone numbers After 10:00 PM, call [place] [phone]

## 2021-04-13 MED ORDER — CLOPIDOGREL BISULFATE 75 MG PO TABS
75.0000 mg | ORAL_TABLET | Freq: Every day | ORAL | 2 refills | Status: DC
Start: 2021-04-14 — End: 2021-06-22

## 2021-04-13 MED ORDER — OXYCODONE-ACETAMINOPHEN 5-325 MG PO TABS: 1.0000 | ORAL_TABLET | Freq: Four times a day (QID) | ORAL | 0 refills | Status: DC | PRN

## 2021-04-13 NOTE — Progress Notes (Signed)
Occupational Therapy Treatment/Discharge Patient Details Name: Arthur Mcmahon MRN: [id number] DOB: 07/19/1966 Today's Date: 04/13/2021    History of present illness 55 y.o male admitted 8/8 for PAD s/p Lt femoral endarterectomy with profundoplasty, insertion of Lt popliteal stent. PMHx: HTN, PAD, and tobacco use disorder   OT comments  Session focused on tub transfer assessment with improved steadiness noted in standing. Pt able to demo LB ADLs and tub transfer with no more than setup assist without need for DME. Pt able to progress in-room mobility without AD, without LOB or safety concerns. Anticipate no DME needs at DC. Encouraged continued elevation of L LE and ankle pumps to combat swelling. No further skilled OT services needed at this time.    Follow Up Recommendations  No OT follow up    Equipment Recommendations  None recommended by OT    Recommendations for Other Services      Precautions / Restrictions Precautions Precautions: Fall Restrictions Weight Bearing Restrictions: No       Mobility Bed Mobility Overal bed mobility: Independent Bed Mobility: Supine to Sit;Sit to Supine     Supine to sit: Independent Sit to supine: Independent        Transfers Overall transfer level: Modified independent Equipment used: Rolling walker (2 wheeled);None Transfers: Sit to/from Stand Sit to Stand: Modified independent (Device/Increase time)         General transfer comment: able to stand without issue using RW, trialed without AD and pt able to complete well    Balance Overall balance assessment: No apparent balance deficits (not formally assessed) Sitting-balance support: No upper extremity supported Sitting balance-Leahy Scale: Normal     Standing balance support: No upper extremity supported;Bilateral upper extremity supported Standing balance-Leahy Scale: Good Standing balance comment: no use of AD for mobility                           ADL  either performed or assessed with clinical judgement   ADL Overall ADL's : Needs assistance/impaired             Lower Body Bathing: Set up;Sit to/from stand Lower Body Bathing Details (indicate cue type and reason): Setup to bathe peri region in standing     Lower Body Dressing: Set up;Sit to/from stand Lower Body Dressing Details (indicate cue type and reason): Setup for LB dressing, some difficulty manuevering LE due to swelling but able to manage. Reports wearing slip on shoes at home, discussed clothing options that are easier to manage         Tub/ Shower Transfer: Set up;Ambulation;Tub transfer Tub/Shower Transfer Details (indicate cue type and reason): practiced stepping over tub height with pt able to complete without use of RW. reports having grab bars if needed. no LOB or safety concerns. Functional mobility during ADLs: Supervision/safety General ADL Comments: Session focused on tub transfer assessment. pt reports having folding chair he uses at home as needed though was able to complete transfers and showering tasks without difficulty despite pain in L LE prior to surgery. Pt able to demo steady mobility without RW in room without issue today     Vision   Vision Assessment?: No apparent visual deficits   Perception     Praxis      Cognition Arousal/Alertness: Awake/alert Behavior During Therapy: WFL for tasks assessed/performed Overall Cognitive Status: Within Functional Limits for tasks assessed  Exercises     Shoulder Instructions       General Comments Encouraged elevation of L LE and ankle pumps to reduce swelling    Pertinent Vitals/ Pain       Pain Assessment: Faces Faces Pain Scale: Hurts a little bit Pain Location: Left foot if putting weight through toes; no pain if offloading toes Pain Descriptors / Indicators: Sore Pain Intervention(s): Monitored during session  Home Living                                           Prior Functioning/Environment              Frequency  Min 2X/week        Progress Toward Goals  OT Goals(current goals can now be found in the care plan section)  Progress towards OT goals: Progressing toward goals  Acute Rehab OT Goals Patient Stated Goal: return home and watch tv OT Goal Formulation: With patient Time For Goal Achievement: 04/26/21 Potential to Achieve Goals: Good ADL Goals Pt Will Perform Grooming: with modified independence;standing Pt Will Perform Lower Body Dressing: with set-up;with modified independence;sitting/lateral leans;sit to/from stand Pt Will Transfer to Toilet: with modified independence;ambulating;regular height toilet;bedside commode Pt Will Perform Toileting - Clothing Manipulation and hygiene: Independently;sitting/lateral leans Pt Will Perform Tub/Shower Transfer: Tub transfer;ambulating;tub bench;rolling walker  Plan Discharge plan remains appropriate    Co-evaluation                 AM-PAC OT "6 Clicks" Daily Activity     Outcome Measure   Help from another person eating meals?: None Help from another person taking care of personal grooming?: None Help from another person toileting, which includes using toliet, bedpan, or urinal?: None Help from another person bathing (including washing, rinsing, drying)?: A Little Help from another person to put on and taking off regular upper body clothing?: None Help from another person to put on and taking off regular lower body clothing?: A Little 6 Click Score: 22    End of Session Equipment Utilized During Treatment: Rolling walker  OT Visit Diagnosis: Unsteadiness on feet (R26.81);Other abnormalities of gait and mobility (R26.89);Muscle weakness (generalized) (M62.81)   Activity Tolerance Patient tolerated treatment well   Patient Left in bed;with call bell/phone within reach;with bed alarm set   Nurse Communication  Mobility status        Time: [id number] OT Time Calculation (min): 16 min  Charges: OT General Charges $OT Visit: 1 Visit OT Treatments $Self Care/Home Management : 8-22 mins  Malachy Chamber, OTR/L Acute Rehab Services Office: [phone]    Layla Maw 04/13/2021, 8:26 AM

## 2021-04-13 NOTE — Progress Notes (Addendum)
  Progress Note    04/13/2021 8:05 AM 2 Days Post-Op  Subjective:  ready for d/c home   Vitals:   04/13/21 0405 04/13/21 0507  BP:  139/84  Pulse:  61  Resp:  16  Temp: 98.5 F (36.9 C) 98.5 F (36.9 C)  SpO2:  100%   Physical Exam: Lungs:  non labored Incisions:  L groin c/d/i Extremities:  brisk L PT and AT by doppler Neurologic: A&O  CBC    Component Value Date/Time   WBC 10.7 (H) 04/12/2021 0400   RBC 3.84 (L) 04/12/2021 0400   HGB 12.8 (L) 04/12/2021 0400   HCT 37.7 (L) 04/12/2021 0400   PLT 250 04/12/2021 0400   MCV 98.2 04/12/2021 0400   MCH 33.3 04/12/2021 0400   MCHC 34.0 04/12/2021 0400   RDW 11.9 04/12/2021 0400   LYMPHSABS 1.7 03/22/2021 0053   MONOABS 0.4 03/22/2021 0053   EOSABS 0.1 03/22/2021 0053   BASOSABS 0.0 03/22/2021 0053    BMET    Component Value Date/Time   NA 132 (L) 04/12/2021 0400   K 4.3 04/12/2021 0400   CL 100 04/12/2021 0400   CO2 29 04/12/2021 0400   GLUCOSE 121 (H) 04/12/2021 0400   BUN 5 (L) 04/12/2021 0400   CREATININE 0.68 04/12/2021 0400   CALCIUM 8.4 (L) 04/12/2021 0400   GFRNONAA >60 04/12/2021 0400   GFRAA >60 12/01/2019 2208    INR    Component Value Date/Time   INR 1.1 04/11/2021 0825     Intake/Output Summary (Last 24 hours) at 04/13/2021 0805 Last data filed at 04/13/2021 [id number] Gross per 24 hour  Intake 480 ml  Output 2150 ml  Net -1670 ml     Assessment/Plan:  55 y.o. male is s/p L femoral endarterectomy and popliteal stent 2 Days Post-Op   L foot well perfused by doppler L groin incision looks good Will allow 2nd toe to demarcate and follow as an outpatient Asa and plavix D/c home today; follow up in 2-3 weeks    Dagoberto Ligas, PA-C [facility] [phone] 04/13/2021 8:05 AM  I have seen and evaluated the patient. I agree with the PA note as documented above.  Postop day 2 status post left common femoral endarterectomy with antegrade left above-knee popliteal  stent.  He has a palpable PT pulse at the ankle.  Groin incision looks good.  Plan discharge today on aspirin Plavix statin.  We will arrange follow-up in 2 to 3 weeks.  We will monitor the left second toe.  [name], MD [facility] Office: [phone]

## 2021-04-13 NOTE — Progress Notes (Signed)
Nursing Discharge note;  Piv dcd. Site unremarkable. Pressure dressing intact. Patient alert and oriented, verbalized understanding of instructions.All belongings given to patient. Awaiting for roommate Barry(patients roommate) to take patient home.

## 2021-04-14 LAB — POCT ACTIVATED CLOTTING TIME
Activated Clotting Time: 237 seconds
Activated Clotting Time: 254 seconds

## 2021-04-18 NOTE — Discharge Summary (Signed)
  Discharge Summary  Patient ID: ASPEN JHA [id number] 55 y.o. [birth year]  Admit date: 04/11/2021  Discharge date and time: 04/13/2021  3:00 PM   Admitting Physician: [name], MD   Discharge Physician: same  Admission Diagnoses: PAOD (peripheral arterial occlusive disease) ([place]) [I77.9]  Discharge Diagnoses: same  Admission Condition: fair  Discharged Condition: fair  Indication for Admission: [place] Hospital Course: Mr. Snow Fleeger is a 55 year old male who is brought in as an outpatient and underwent left common femoral endarterectomy, bovine patch angioplasty, and left above-the-knee popliteal angioplasty and stent placement by Dr. Carlis Abbott due to critical limb ischemia with tissue loss.  He tolerated the procedure well and was admitted to the hospital overnight.  Over the course of his hospital stay his left foot remained well perfused with brisk Doppler signals.  The plan is to allow the second toe to demarcate and this will be followed as an outpatient.  He was prescribed 2 to 3 days of narcotic pain medication for continued postoperative pain control.  He will follow-up in the office in 2 to 3 weeks.  He was prescribed Plavix and will take this in addition to his aspirin and statin daily.  He was discharged home in stable condition.  Consults: None  Treatments: surgery: 04/11/21 by Dr. Carlis Abbott 1.  Left common femoral endarterectomy with profundoplasty and bovine pericardial patch angioplasty 2.  Left lower extremity arteriogram with selection of third order branches 3.  Left above-knee popliteal angioplasty with stent placement (stented with 7 mm x 40 mm self-expanding Innova postdilated with a 6 mm x 40 mm Mustang)  Discharge Exam: See progress note 04/13/21 Vitals:   04/13/21 0507 04/13/21 1255  BP: 139/84 115/84  Pulse: 61 77  Resp: 16 18  Temp: 98.5 F (36.9 C) 98.4 F (36.9 C)  SpO2: 100% 100%     Disposition: Discharge disposition: 01-Home or Self  Care       Patient Instructions:  Allergies as of 04/13/2021   No Known Allergies      Medication List     STOP taking these medications    Oxycodone HCl 10 MG Tabs       TAKE these medications    acetaminophen 500 MG tablet Commonly known as: TYLENOL Take 500 mg by mouth every 6 (six) hours as needed for mild pain, fever or headache.   aspirin EC 81 MG tablet Take 1 tablet (81 mg total) by mouth daily. Swallow whole.   atorvastatin 40 MG tablet Commonly known as: Lipitor Take 1 tablet (40 mg total) by mouth daily.   clopidogrel 75 MG tablet Commonly known as: PLAVIX Take 1 tablet (75 mg total) by mouth daily at 6 (six) AM.   oxyCODONE-acetaminophen 5-325 MG tablet Commonly known as: PERCOCET/ROXICET Take 1 tablet by mouth every 6 (six) hours as needed for moderate pain.       Activity: activity as tolerated Diet: regular diet Wound Care: keep wound clean and dry  Follow-up with [facility] in 2 weeks.  Signed: Dagoberto Ligas, PA-C 04/18/2021 8:23 AM [facility] Office: [phone]

## 2021-04-21 ENCOUNTER — Telehealth: Payer: Self-pay

## 2021-04-21 NOTE — Telephone Encounter (Signed)
Patient calls today to report that his toe has worsened since his endarterectomy/stenting on 8/8. Left second toe is black, "looks dead." Sounds like it has demarcated. Patient says it hurts a little, but otherwise no complaints. Placed on schedule for evaluation.

## 2021-04-26 ENCOUNTER — Ambulatory Visit (INDEPENDENT_AMBULATORY_CARE_PROVIDER_SITE_OTHER): Payer: Self-pay | Admitting: Vascular Surgery

## 2021-04-26 ENCOUNTER — Other Ambulatory Visit: Payer: Self-pay

## 2021-04-26 ENCOUNTER — Ambulatory Visit (HOSPITAL_COMMUNITY)
Admission: RE | Admit: 2021-04-26 | Discharge: 2021-04-26 | Disposition: A | Discharge status: Home / Self Care | Payer: Medicaid Other | Source: Ambulatory Visit | Attending: Vascular Surgery | Admitting: Vascular Surgery

## 2021-04-26 ENCOUNTER — Encounter: Payer: Self-pay | Admitting: Vascular Surgery

## 2021-04-26 ENCOUNTER — Other Ambulatory Visit (HOSPITAL_COMMUNITY): Payer: Self-pay | Admitting: Vascular Surgery

## 2021-04-26 VITALS — BP 145/88 | HR 87 | Temp 97.9°F | Resp 18 | Ht 74.0 in | Wt 199.0 lb

## 2021-04-26 DIAGNOSIS — T148XXA Other injury of unspecified body region, initial encounter: Secondary | ICD-10-CM

## 2021-04-26 DIAGNOSIS — I739 Peripheral vascular disease, unspecified: Secondary | ICD-10-CM

## 2021-04-26 MED ORDER — HYDROCODONE-ACETAMINOPHEN 5-325 MG PO TABS: 1.0000 | ORAL_TABLET | Freq: Four times a day (QID) | ORAL | 0 refills | Status: DC | PRN

## 2021-04-26 NOTE — Progress Notes (Signed)
Patient name: Arthur Mcmahon MRN: [id number] DOB: 10-20-1965 Sex: male  REASON FOR CONSULT: Evaluate necrotic left second toe  HPI: Arthur Mcmahon is a 55 y.o. male, with history of hypertension, tobacco abuse, EtOH abuse that presents for evaluation of black left second toe.  He was initially seen for discolored left second toe.  On 04/11/2021 he underwent a left common femoral endarterectomy with profundoplasty and a left lower extremity arteriogram and left above-knee popliteal artery stent placement.  He complains of some swelling in the groin as well as some swelling in the left leg.  Taking aspirin and plavix.  Past Medical History:  Diagnosis Date   Anemia    ETOH abuse    Hypertension    Peripheral vascular disease ([place])     Past Surgical History:  Procedure Laterality Date   ABDOMINAL AORTOGRAM W/LOWER EXTREMITY Bilateral 03/31/2021   Procedure: ABDOMINAL AORTOGRAM W/LOWER EXTREMITY;  Surgeon: [name], MD;  Location: [place] [facility];  Service: Cardiovascular;  Laterality: Bilateral;   ENDARTERECTOMY FEMORAL Left 04/11/2021   Procedure: LEFT FEMORAL ENDARTERECTOMY;  Surgeon: [name], MD;  Location: [facility];  Service: Vascular;  Laterality: Left;   FRACTURE SURGERY Right    femur   INSERTION OF ILIAC STENT Left 04/11/2021   Procedure: INSERTION OF LEFT ABOVE KNEE POPLITEAL STENT;  Surgeon: [name], MD;  Location: [place];  Service: Vascular;  Laterality: Left;   INTRAOPERATIVE ARTERIOGRAM Left 04/11/2021   Procedure: INTRA OPERATIVE ARTERIOGRAM;  Surgeon: [name], MD;  Location: [place];  Service: Vascular;  Laterality: Left;   PATCH ANGIOPLASTY Left 04/11/2021   Procedure: PATCH ANGIOPLASTY LEFT COMMON FEMORAL ARTERY, PROFUNDOPLASTY;  Surgeon: [name], MD;  [facility];  Service: Vascular;  Laterality: Left;   TONSILLECTOMY      Family History  Problem Relation Age of Onset   Cancer Mother    Heart failure Mother     Cancer Father     SOCIAL HISTORY: Social History   Socioeconomic History   Marital status: Divorced    Spouse name: Not on file   Number of children: Not on file   Years of education: Not on file   Highest education level: Not on file  Occupational History   Not on file  Tobacco Use   Smoking status: Every Day    Packs/day: 1.50    Years: 25.00    Pack years: 37.50    Types: Cigarettes   Smokeless tobacco: Never  Vaping Use   Vaping Use: Never used  Substance and Sexual Activity   Alcohol use: Not Currently    Comment: daily   Drug use: Yes    Types: Marijuana    Comment: 2-3 a week   Sexual activity: Not on file  Other Topics Concern   Not on file  Social History Narrative   Not on file   Social Determinants of Health   Financial Resource Strain: Not on file  Food Insecurity: Not on file  Transportation Needs: Not on file  Physical Activity: Not on file  Stress: Not on file  Social Connections: Not on file  Intimate Partner Violence: Not on file    No Known Allergies  Current Outpatient Medications  Medication Sig Dispense Refill   acetaminophen (TYLENOL) 500 MG tablet Take 500 mg by mouth every 6 (six) hours as needed for mild pain, fever or headache.     aspirin EC 81 MG tablet Take 1 tablet (  81 mg total) by mouth daily. Swallow whole. 365 tablet 0   atorvastatin (LIPITOR) 40 MG tablet Take 1 tablet (40 mg total) by mouth daily. 30 tablet 11   clopidogrel (PLAVIX) 75 MG tablet Take 1 tablet (75 mg total) by mouth daily at 6 (six) AM. 30 tablet 2   oxyCODONE-acetaminophen (PERCOCET/ROXICET) 5-325 MG tablet Take 1 tablet by mouth every 6 (six) hours as needed for moderate pain. (Patient not taking: Reported on 04/26/2021) 20 tablet 0   No current facility-administered medications for this visit.    REVIEW OF SYSTEMS:  '[X]'$  denotes positive finding, '[ ]'$  denotes negative finding Cardiac  Comments:  Chest pain or chest pressure:    Shortness of breath  upon exertion:    Short of breath when lying flat:    Irregular heart rhythm:        Vascular    Pain in calf, thigh, or hip brought on by ambulation:    Pain in feet at night that wakes you up from your sleep:     Blood clot in your veins:    Leg swelling:  x       Pulmonary    Oxygen at home:    Productive cough:     Wheezing:         Neurologic    Sudden weakness in arms or legs:     Sudden numbness in arms or legs:     Sudden onset of difficulty speaking or slurred speech:    Temporary loss of vision in one eye:     Problems with dizziness:         Gastrointestinal    Blood in stool:     Vomited blood:         Genitourinary    Burning when urinating:     Blood in urine:        Psychiatric    Major depression:         Hematologic    Bleeding problems:    Problems with blood clotting too easily:        Skin    Rashes or ulcers:        Constitutional    Fever or chills:      PHYSICAL EXAM: Vitals:   04/26/21 0924  BP: (!) 145/88  Pulse: 87  Resp: 18  Temp: 97.9 F (36.6 C)  TempSrc: Temporal  SpO2: 96%  Weight: 199 lb (90.3 kg)  Height: '6\' 2"'$  (1.88 m)    GENERAL: The patient is a well-nourished male, in no acute distress. The vital signs are documented above. CARDIAC: There is a regular rate and rhythm.  VASCULAR:  Left femoral incision is clean and dry with evidence of hematoma, no signs of infection Left PT palpable Left second toe necrotic          DATA:   Left groin pseudoaneurysm duplex shows no evidence of pseudoaneurysm and a bilobed hematoma.  Assessment/Plan:  55 year old male status post left common femoral endarterectomy with profundoplasty and bovine patch as well as a left above-knee popliteal artery angioplasty with stent placement on 04/11/2021.  He presents for worsening discoloration of the left second toe.  He will need a left second toe amputation which we will schedule later this week for Friday as an outpatient.  In  addition he has some fullness in the left groin and I did get a pseudoaneurysm study today that shows no evidence of pseudoaneurysm and just a hematoma.  Discussed he can  continue aspirin Plavix from my standpoint.   [name], MD [facility] Office: [phone]

## 2021-04-28 ENCOUNTER — Encounter (HOSPITAL_COMMUNITY): Payer: Self-pay | Admitting: Vascular Surgery

## 2021-04-28 ENCOUNTER — Other Ambulatory Visit: Payer: Self-pay

## 2021-04-28 NOTE — Progress Notes (Signed)
Spoke with pt for pre-op call. Pt denies cardiac history or Diabetes. Pt has hx of HTN but is not any medication for it at this time.   Pt's surgery is scheduled as ambulatory so no Covid test is required prior to surgery.

## 2021-04-28 NOTE — Anesthesia Preprocedure Evaluation (Addendum)
Anesthesia Evaluation  Patient identified by MRN, date of birth, ID band Patient awake    Reviewed: Allergy & Precautions, NPO status , Patient's Chart, lab work & pertinent test results  History of Anesthesia Complications Negative for: history of anesthetic complications  Airway Mallampati: II  TM Distance: >3 FB Neck ROM: Full    Dental  (+) Poor Dentition, Missing, Loose, Dental Advisory Given   Pulmonary Current SmokerPatient did not abstain from smoking.,    Pulmonary exam normal        Cardiovascular hypertension, Pt. on medications + Peripheral Vascular Disease  Normal cardiovascular exam     Neuro/Psych negative neurological ROS     GI/Hepatic negative GI ROS, Neg liver ROS,   Endo/Other  negative endocrine ROS  Renal/GU negative Renal ROS     Musculoskeletal   Abdominal   Peds  Hematology  (+) anemia ,   Anesthesia Other Findings   Reproductive/Obstetrics                            Lab Results  Component Value Date   WBC 10.7 (H) 04/12/2021   HGB 12.8 (L) 04/12/2021   HCT 37.7 (L) 04/12/2021   MCV 98.2 04/12/2021   PLT 250 04/12/2021   Lab Results  Component Value Date   CREATININE 0.68 04/12/2021   BUN 5 (L) 04/12/2021   NA 132 (L) 04/12/2021   K 4.3 04/12/2021   CL 100 04/12/2021   CO2 29 04/12/2021    Anesthesia Physical  Anesthesia Plan  ASA: 3  Anesthesia Plan: MAC   Post-op Pain Management:    Induction: Intravenous  PONV Risk Score and Plan: 1 and Ondansetron, Dexamethasone, Treatment may vary due to age or medical condition and Propofol infusion  Airway Management Planned: Natural Airway  Additional Equipment:   Intra-op Plan:   Post-operative Plan:   Informed Consent: I have reviewed the patients History and Physical, chart, labs and discussed the procedure including the risks, benefits and alternatives for the proposed anesthesia with the  patient or authorized representative who has indicated his/her understanding and acceptance.     Dental advisory given  Plan Discussed with: Anesthesiologist, CRNA and Surgeon  Anesthesia Plan Comments:       Anesthesia Quick Evaluation

## 2021-04-29 ENCOUNTER — Ambulatory Visit (HOSPITAL_COMMUNITY)
Admission: RE | Admit: 2021-04-29 | Discharge: 2021-04-29 | Disposition: A | Discharge status: Home / Self Care | Payer: Medicaid Other | Attending: Vascular Surgery | Admitting: Vascular Surgery

## 2021-04-29 ENCOUNTER — Encounter (HOSPITAL_COMMUNITY)
Admission: RE | Disposition: A | Discharge status: Home / Self Care | Payer: Self-pay | Source: Home / Self Care | Attending: Vascular Surgery

## 2021-04-29 ENCOUNTER — Other Ambulatory Visit: Payer: Self-pay

## 2021-04-29 ENCOUNTER — Ambulatory Visit (HOSPITAL_COMMUNITY): Payer: Medicaid Other | Admitting: Anesthesiology

## 2021-04-29 ENCOUNTER — Encounter (HOSPITAL_COMMUNITY): Payer: Self-pay | Admitting: Vascular Surgery

## 2021-04-29 DIAGNOSIS — Z7982 Long term (current) use of aspirin: Secondary | ICD-10-CM | POA: Insufficient documentation

## 2021-04-29 DIAGNOSIS — Z7902 Long term (current) use of antithrombotics/antiplatelets: Secondary | ICD-10-CM | POA: Insufficient documentation

## 2021-04-29 DIAGNOSIS — Z95820 Peripheral vascular angioplasty status with implants and grafts: Secondary | ICD-10-CM | POA: Diagnosis not present

## 2021-04-29 DIAGNOSIS — Z79899 Other long term (current) drug therapy: Secondary | ICD-10-CM | POA: Diagnosis not present

## 2021-04-29 DIAGNOSIS — F17210 Nicotine dependence, cigarettes, uncomplicated: Secondary | ICD-10-CM | POA: Insufficient documentation

## 2021-04-29 DIAGNOSIS — I10 Essential (primary) hypertension: Secondary | ICD-10-CM | POA: Insufficient documentation

## 2021-04-29 DIAGNOSIS — I96 Gangrene, not elsewhere classified: Secondary | ICD-10-CM | POA: Diagnosis present

## 2021-04-29 DIAGNOSIS — Z9889 Other specified postprocedural states: Secondary | ICD-10-CM

## 2021-04-29 DIAGNOSIS — I70222 Atherosclerosis of native arteries of extremities with rest pain, left leg: Secondary | ICD-10-CM | POA: Diagnosis not present

## 2021-04-29 DIAGNOSIS — Z8249 Family history of ischemic heart disease and other diseases of the circulatory system: Secondary | ICD-10-CM | POA: Diagnosis not present

## 2021-04-29 DIAGNOSIS — Z9862 Peripheral vascular angioplasty status: Secondary | ICD-10-CM

## 2021-04-29 HISTORY — PX: AMPUTATION: SHX166

## 2021-04-29 HISTORY — DX: Pneumonia, unspecified organism: J18.9

## 2021-04-29 LAB — POCT I-STAT, CHEM 8
BUN: <3 mg/dL — ABNORMAL LOW (ref 6–20)
Calcium, Ion: 1.1 mmol/L — ABNORMAL LOW (ref 1.15–1.40)
Chloride: 96 mmol/L — ABNORMAL LOW (ref 98–111)
Creatinine, Ser: 0.5 mg/dL — ABNORMAL LOW (ref 0.61–1.24)
Glucose, Bld: 99 mg/dL (ref 70–99)
HCT: 42 % (ref 39.0–52.0)
Hemoglobin: 14.3 g/dL (ref 13.0–17.0)
Potassium: 4.6 mmol/L (ref 3.5–5.1)
Sodium: 132 mmol/L — ABNORMAL LOW (ref 135–145)
TCO2: 25 mmol/L (ref 22–32)

## 2021-04-29 SURGERY — AMPUTATION DIGIT
Anesthesia: Monitor Anesthesia Care | Laterality: Left

## 2021-04-29 MED ORDER — SODIUM CHLORIDE 0.9 % IV SOLN: INTRAVENOUS | Status: DC

## 2021-04-29 MED ORDER — CHLORHEXIDINE GLUCONATE 0.12 % MT SOLN
OROMUCOSAL | Status: AC
  Administered 2021-04-29: 15 mL via OROMUCOSAL
  Filled 2021-04-29: qty 15

## 2021-04-29 MED ORDER — CEFAZOLIN SODIUM-DEXTROSE 2-4 GM/100ML-% IV SOLN
2.0000 g | INTRAVENOUS | Status: AC
  Administered 2021-04-29: 2 g via INTRAVENOUS
  Filled 2021-04-29: qty 100

## 2021-04-29 MED ORDER — PROPOFOL 500 MG/50ML IV EMUL
INTRAVENOUS | Status: DC | PRN
  Administered 2021-04-29: 125 ug/kg/min via INTRAVENOUS

## 2021-04-29 MED ORDER — FENTANYL CITRATE (PF) 250 MCG/5ML IJ SOLN
INTRAMUSCULAR | Status: DC | PRN
  Administered 2021-04-29: 25 ug via INTRAVENOUS
  Administered 2021-04-29: 50 ug via INTRAVENOUS

## 2021-04-29 MED ORDER — AMISULPRIDE (ANTIEMETIC) 5 MG/2ML IV SOLN: 10.0000 mg | Freq: Once | INTRAVENOUS | Status: DC | PRN

## 2021-04-29 MED ORDER — ONDANSETRON HCL 4 MG/2ML IJ SOLN
INTRAMUSCULAR | Status: DC | PRN
  Administered 2021-04-29: 4 mg via INTRAVENOUS

## 2021-04-29 MED ORDER — FENTANYL CITRATE (PF) 100 MCG/2ML IJ SOLN: 25.0000 ug | INTRAMUSCULAR | Status: DC | PRN

## 2021-04-29 MED ORDER — MIDAZOLAM HCL 2 MG/2ML IJ SOLN
INTRAMUSCULAR | Status: AC
  Filled 2021-04-29: qty 2

## 2021-04-29 MED ORDER — CHLORHEXIDINE GLUCONATE 0.12 % MT SOLN: 15.0000 mL | Freq: Once | OROMUCOSAL | Status: AC

## 2021-04-29 MED ORDER — CHLORHEXIDINE GLUCONATE 4 % EX LIQD: 60.0000 mL | Freq: Once | CUTANEOUS | Status: DC

## 2021-04-29 MED ORDER — PROPOFOL 10 MG/ML IV BOLUS
INTRAVENOUS | Status: DC | PRN
  Administered 2021-04-29: 30 mg via INTRAVENOUS
  Administered 2021-04-29: 40 mg via INTRAVENOUS

## 2021-04-29 MED ORDER — PROMETHAZINE HCL 25 MG/ML IJ SOLN: 6.2500 mg | INTRAMUSCULAR | Status: DC | PRN

## 2021-04-29 MED ORDER — CELECOXIB 200 MG PO CAPS
200.0000 mg | ORAL_CAPSULE | Freq: Once | ORAL | Status: AC
  Administered 2021-04-29: 200 mg via ORAL
  Filled 2021-04-29: qty 1

## 2021-04-29 MED ORDER — LIDOCAINE-EPINEPHRINE (PF) 1 %-1:200000 IJ SOLN
INTRAMUSCULAR | Status: AC
  Filled 2021-04-29: qty 30

## 2021-04-29 MED ORDER — HYDROCODONE-ACETAMINOPHEN 5-325 MG PO TABS: 1.0000 | ORAL_TABLET | Freq: Four times a day (QID) | ORAL | 0 refills | Status: DC | PRN

## 2021-04-29 MED ORDER — ACETAMINOPHEN 500 MG PO TABS
1000.0000 mg | ORAL_TABLET | Freq: Once | ORAL | Status: AC
  Administered 2021-04-29: 1000 mg via ORAL
  Filled 2021-04-29: qty 2

## 2021-04-29 MED ORDER — PROPOFOL 10 MG/ML IV BOLUS
INTRAVENOUS | Status: AC
  Filled 2021-04-29: qty 40

## 2021-04-29 MED ORDER — ALBUTEROL SULFATE (2.5 MG/3ML) 0.083% IN NEBU
INHALATION_SOLUTION | RESPIRATORY_TRACT | Status: AC
  Filled 2021-04-29: qty 3

## 2021-04-29 MED ORDER — ALBUTEROL SULFATE (2.5 MG/3ML) 0.083% IN NEBU
2.5000 mg | INHALATION_SOLUTION | Freq: Once | RESPIRATORY_TRACT | Status: AC
  Administered 2021-04-29: 2.5 mg via RESPIRATORY_TRACT

## 2021-04-29 MED ORDER — LACTATED RINGERS IV SOLN: INTRAVENOUS | Status: DC

## 2021-04-29 MED ORDER — 0.9 % SODIUM CHLORIDE (POUR BTL) OPTIME
TOPICAL | Status: DC | PRN
  Administered 2021-04-29: 1000 mL

## 2021-04-29 MED ORDER — ORAL CARE MOUTH RINSE: 15.0000 mL | Freq: Once | OROMUCOSAL | Status: AC

## 2021-04-29 MED ORDER — FENTANYL CITRATE (PF) 250 MCG/5ML IJ SOLN
INTRAMUSCULAR | Status: AC
  Filled 2021-04-29: qty 5

## 2021-04-29 MED ORDER — MIDAZOLAM HCL 2 MG/2ML IJ SOLN
INTRAMUSCULAR | Status: DC | PRN
  Administered 2021-04-29: 2 mg via INTRAVENOUS

## 2021-04-29 SURGICAL SUPPLY — 30 items
BAG COUNTER SPONGE SURGICOUNT (BAG) ×2 IMPLANT
BNDG ELASTIC 4X5.8 VLCR STR LF (GAUZE/BANDAGES/DRESSINGS) ×2 IMPLANT
BNDG GAUZE ELAST 4 BULKY (GAUZE/BANDAGES/DRESSINGS) ×2 IMPLANT
CANISTER SUCT 3000ML PPV (MISCELLANEOUS) ×2 IMPLANT
COVER SURGICAL LIGHT HANDLE (MISCELLANEOUS) ×2 IMPLANT
DRAPE EXTREMITY T 121X128X90 (DISPOSABLE) ×2 IMPLANT
DRAPE HALF SHEET 40X57 (DRAPES) ×2 IMPLANT
DRSG EMULSION OIL 3X3 NADH (GAUZE/BANDAGES/DRESSINGS) ×2 IMPLANT
ELECT REM PT RETURN 9FT ADLT (ELECTROSURGICAL) ×2
ELECTRODE REM PT RTRN 9FT ADLT (ELECTROSURGICAL) ×1 IMPLANT
GAUZE SPONGE 4X4 12PLY STRL (GAUZE/BANDAGES/DRESSINGS) ×2 IMPLANT
GAUZE SPONGE 4X4 16PLY XRAY LF (GAUZE/BANDAGES/DRESSINGS) ×2 IMPLANT
GLOVE SRG 8 PF TXTR STRL LF DI (GLOVE) ×1 IMPLANT
GLOVE SURG ENC MOIS LTX SZ7.5 (GLOVE) ×2 IMPLANT
GLOVE SURG UNDER POLY LF SZ8 (GLOVE) ×2
GOWN STRL REUS W/ TWL LRG LVL3 (GOWN DISPOSABLE) ×2 IMPLANT
GOWN STRL REUS W/ TWL XL LVL3 (GOWN DISPOSABLE) ×2 IMPLANT
GOWN STRL REUS W/TWL LRG LVL3 (GOWN DISPOSABLE) ×4
GOWN STRL REUS W/TWL XL LVL3 (GOWN DISPOSABLE) ×4
KIT BASIN OR (CUSTOM PROCEDURE TRAY) ×2 IMPLANT
KIT TURNOVER KIT B (KITS) ×2 IMPLANT
NS IRRIG 1000ML POUR BTL (IV SOLUTION) ×2 IMPLANT
PACK GENERAL/GYN (CUSTOM PROCEDURE TRAY) ×2 IMPLANT
PAD ARMBOARD 7.5X6 YLW CONV (MISCELLANEOUS) ×4 IMPLANT
SUT ETHILON 3 0 PS 1 (SUTURE) ×2 IMPLANT
SUT VIC AB 3-0 SH 27 (SUTURE)
SUT VIC AB 3-0 SH 27X BRD (SUTURE) IMPLANT
TOWEL GREEN STERILE (TOWEL DISPOSABLE) ×4 IMPLANT
UNDERPAD 30X36 HEAVY ABSORB (UNDERPADS AND DIAPERS) ×2 IMPLANT
WATER STERILE IRR 1000ML POUR (IV SOLUTION) ×2 IMPLANT

## 2021-04-29 NOTE — Op Note (Signed)
Date: April 29, 2021  Preoperative diagnosis: Critical limb ischemia of the left lower extremity with tissue loss  Postoperative diagnosis: Same  Procedure: Partial left second toe amputation  Surgeon: Dr. [name], MD  Assistant: OR staff  Indications: Patient is a 55 year old male who underwent left common femoral endarterectomy with left above-knee popliteal angioplasty via antegrade approach for left second toe wound on 04/11/21.  His toe has continued to demarcate and he presents today for partial toe amputation after risk benefits discussed.  Findings: Partial toe amputation of the left second toe.  The wound was primarily closed with 2-0 nylon loosely.  Tissue appeared healthy.  Details: Patient was taken to the operating room after informed consent was obtained.  Placed on the operative table in supine position.  The left foot was prepped and draped in usual sterile fashion.  Preop timeout was performed.  I injected about 10 cc of 1% lidocaine with epinephrine for digital block in the left second toe.  A fishmouth incision was made with a 15 blade scalpel at the base of the toe.  Ultimately I transected the toe between the phalanges.  I then took more bone back with a rongeur.  I did not get to the metatarsal head.  The wound was copiously irrigated.  This was closed loosely with 2-0 nylon's.  Dry sterile dressings were applied.  Complication: None  Condition: Stable  [name], MD [facility] Office: [place]

## 2021-04-29 NOTE — Transfer of Care (Signed)
Immediate Anesthesia Transfer of Care Note  Patient: Arthur Mcmahon  Procedure(s) Performed: Left second Partial TOE AMPUTATION (Left)  Patient Location: PACU  Anesthesia Type:MAC  Level of Consciousness: awake, alert  and oriented  Airway & Oxygen Therapy: Patient Spontanous Breathing  Post-op Assessment: Report given to RN and Post -op Vital signs reviewed and stable  Post vital signs: Reviewed and stable  Last Vitals:  Vitals Value Taken Time  BP 115/74 04/29/21 1050  Temp    Pulse 81 04/29/21 1052  Resp 20 04/29/21 1052  SpO2 99 % 04/29/21 1052  Vitals shown include unvalidated device data.  Last Pain:  Vitals:   04/29/21 0754  TempSrc:   PainSc: 6          Complications: No notable events documented.

## 2021-04-29 NOTE — Progress Notes (Signed)
Orthopedic Tech Progress Note Patient Details:  Arthur Mcmahon [birth year] [id number]  PACU RN called requesting a [facility] SHOE patient   Ortho Devices Type of Ortho Device: Darco shoe Ortho Device/Splint Location: LLE Ortho Device/Splint Interventions: Application, Ordered   Post Interventions Patient Tolerated: Well Instructions Provided: [name] 04/29/2021, 11:32 AM

## 2021-04-29 NOTE — H&P (Signed)
History and Physical Interval Note:  04/29/2021 9:41 AM  Arthur Mcmahon  has presented today for surgery, with the diagnosis of NECROSIS OF TOE.  The various methods of treatment have been discussed with the patient and family. After consideration of risks, benefits and other options for treatment, the patient has consented to  Procedure(s): LEFT SECOND TOE AMPUTATION (Left) as a surgical intervention.  The patient's history has been reviewed, patient examined, no change in status, stable for surgery.  I have reviewed the patient's chart and labs.  Questions were answered to the patient's satisfaction.     [name]  Patient name: Arthur Mcmahon            MRN: [id number]        DOB: 03-09-1966          Sex: male   REASON FOR CONSULT: Evaluate necrotic left second toe   HPI: Arthur Mcmahon is a 55 y.o. male, with history of hypertension, tobacco abuse, EtOH abuse that presents for evaluation of black left second toe.  He was initially seen for discolored left second toe.  On 04/11/2021 he underwent a left common femoral endarterectomy with profundoplasty and a left lower extremity arteriogram and left above-knee popliteal artery stent placement.  He complains of some swelling in the groin as well as some swelling in the left leg.  Taking aspirin and plavix.       Past Medical History:  Diagnosis Date   Anemia     ETOH abuse     Hypertension     Peripheral vascular disease ([place])             Past Surgical History:  Procedure Laterality Date   ABDOMINAL AORTOGRAM W/LOWER EXTREMITY Bilateral 03/31/2021    Procedure: ABDOMINAL AORTOGRAM W/LOWER EXTREMITY;  Surgeon: [name], MD;  Location: [place] [facility];  Service: Cardiovascular;  Laterality: Bilateral;   ENDARTERECTOMY FEMORAL Left 04/11/2021    Procedure: LEFT FEMORAL ENDARTERECTOMY;  Surgeon: [name], MD;  Location: [place] [facility];  Service: Vascular;  Laterality: Left;   FRACTURE SURGERY Right      femur    INSERTION OF ILIAC STENT Left 04/11/2021    Procedure: INSERTION OF LEFT ABOVE KNEE POPLITEAL STENT;  Surgeon: [name], MD;  Location: [place];  Service: Vascular;  Laterality: Left;   INTRAOPERATIVE ARTERIOGRAM Left 04/11/2021    Procedure: INTRA OPERATIVE ARTERIOGRAM;  Surgeon: [name], MD;  Location: [facility];  Service: Vascular;  Laterality: Left;   PATCH ANGIOPLASTY Left 04/11/2021    Procedure: PATCH ANGIOPLASTY LEFT COMMON FEMORAL ARTERY, PROFUNDOPLASTY;  Surgeon: [name], MD;  Location: [facility];  Service: Vascular;  Laterality: Left;   TONSILLECTOMY               Family History  Problem Relation Age of Onset   Cancer Mother     Heart failure Mother     Cancer Father        SOCIAL HISTORY: Social History         Socioeconomic History   Marital status: Divorced      Spouse name: Not on file   Number of children: Not on file   Years of education: Not on file   Highest education level: Not on file  Occupational History   Not on file  Tobacco Use   Smoking status: Every Day      Packs/day: 1.50      Years: 25.00  Pack years: 37.50      Types: Cigarettes   Smokeless tobacco: Never  Vaping Use   Vaping Use: Never used  Substance and Sexual Activity   Alcohol use: Not Currently      Comment: daily   Drug use: Yes      Types: Marijuana      Comment: 2-3 a week   Sexual activity: Not on file  Other Topics Concern   Not on file  Social History Narrative   Not on file    Social Determinants of Health    Financial Resource Strain: Not on file  Food Insecurity: Not on file  Transportation Needs: Not on file  Physical Activity: Not on file  Stress: Not on file  Social Connections: Not on file  Intimate Partner Violence: Not on file      No Known Allergies         Current Outpatient Medications  Medication Sig Dispense Refill   acetaminophen (TYLENOL) 500 MG tablet Take 500 mg by mouth every 6 (six) hours as needed for mild pain,  fever or headache.       aspirin EC 81 MG tablet Take 1 tablet (81 mg total) by mouth daily. Swallow whole. 365 tablet 0   atorvastatin (LIPITOR) 40 MG tablet Take 1 tablet (40 mg total) by mouth daily. 30 tablet 11   clopidogrel (PLAVIX) 75 MG tablet Take 1 tablet (75 mg total) by mouth daily at 6 (six) AM. 30 tablet 2   oxyCODONE-acetaminophen (PERCOCET/ROXICET) 5-325 MG tablet Take 1 tablet by mouth every 6 (six) hours as needed for moderate pain. (Patient not taking: Reported on 04/26/2021) 20 tablet 0    No current facility-administered medications for this visit.      REVIEW OF SYSTEMS:  '[X]'$  denotes positive finding, '[ ]'$  denotes negative finding Cardiac   Comments:  Chest pain or chest pressure:      Shortness of breath upon exertion:      Short of breath when lying flat:      Irregular heart rhythm:             Vascular      Pain in calf, thigh, or hip brought on by ambulation:      Pain in feet at night that wakes you up from your sleep:       Blood clot in your veins:      Leg swelling:  x           Pulmonary      Oxygen at home:      Productive cough:       Wheezing:              Neurologic      Sudden weakness in arms or legs:       Sudden numbness in arms or legs:       Sudden onset of difficulty speaking or slurred speech:      Temporary loss of vision in one eye:       Problems with dizziness:              Gastrointestinal      Blood in stool:       Vomited blood:              Genitourinary      Burning when urinating:       Blood in urine:             Psychiatric  Major depression:              Hematologic      Bleeding problems:      Problems with blood clotting too easily:             Skin      Rashes or ulcers:             Constitutional      Fever or chills:          PHYSICAL EXAM:    Vitals:    04/26/21 0924  BP: (!) 145/88  Pulse: 87  Resp: 18  Temp: 97.9 F (36.6 C)  TempSrc: Temporal  SpO2: 96%  Weight: 199 lb (90.3 kg)   Height: '6\' 2"'$  (1.88 m)      GENERAL: The patient is a well-nourished male, in no acute distress. The vital signs are documented above. CARDIAC: There is a regular rate and rhythm.  VASCULAR:  Left femoral incision is clean and dry with evidence of hematoma, no signs of infection Left PT palpable Left second toe necrotic                DATA:    Left groin pseudoaneurysm duplex shows no evidence of pseudoaneurysm and a bilobed hematoma.   Assessment/Plan:   55 year old male status post left common femoral endarterectomy with profundoplasty and bovine patch as well as a left above-knee popliteal artery angioplasty with stent placement on 04/11/2021.  He presents for worsening discoloration of the left second toe.  He will need a left second toe amputation which we will schedule later this week for Friday as an outpatient.  In addition he has some fullness in the left groin and I did get a pseudoaneurysm study today that shows no evidence of pseudoaneurysm and just a hematoma.  Discussed he can continue aspirin Plavix from my standpoint.     [name], MD [facility] [place] Office: [phone]

## 2021-04-29 NOTE — Anesthesia Procedure Notes (Signed)
Procedure Name: MAC Date/Time: 04/29/2021 10:19 AM Performed by: Dorthea Cove, CRNA Pre-anesthesia Checklist: Patient identified, Emergency Drugs available, Suction available, Patient being monitored and Timeout performed Patient Re-evaluated:Patient Re-evaluated prior to induction Oxygen Delivery Method: Simple face mask Preoxygenation: Pre-oxygenation with 100% oxygen Induction Type: IV induction Placement Confirmation: positive ETCO2 and CO2 detector Dental Injury: Teeth and Oropharynx as per pre-operative assessment

## 2021-04-29 NOTE — Anesthesia Postprocedure Evaluation (Signed)
Anesthesia Post Note  Patient: Arthur Mcmahon  Procedure(s) Performed: Left second Partial TOE AMPUTATION (Left)     Patient location during evaluation: PACU Anesthesia Type: MAC Level of consciousness: awake and alert Pain management: pain level controlled Vital Signs Assessment: post-procedure vital signs reviewed and stable Respiratory status: spontaneous breathing and respiratory function stable Cardiovascular status: stable Postop Assessment: no apparent nausea or vomiting Anesthetic complications: no   No notable events documented.  Last Vitals:  Vitals:   04/29/21 1105 04/29/21 1120  BP: 129/72 118/75  Pulse: 75   Resp: 16 16  Temp:  36.5 C  SpO2: 97% 98%    Last Pain:  Vitals:   04/29/21 1120  TempSrc:   PainSc: 0-No pain                 Nichole Keltner DANIEL

## 2021-04-30 ENCOUNTER — Encounter (HOSPITAL_COMMUNITY): Payer: Self-pay | Admitting: Vascular Surgery

## 2021-05-03 ENCOUNTER — Ambulatory Visit (INDEPENDENT_AMBULATORY_CARE_PROVIDER_SITE_OTHER): Payer: Self-pay | Admitting: Physician Assistant

## 2021-05-03 ENCOUNTER — Encounter: Payer: Self-pay | Admitting: Physician Assistant

## 2021-05-03 ENCOUNTER — Other Ambulatory Visit: Payer: Self-pay

## 2021-05-03 VITALS — BP 134/80 | HR 98 | Temp 98.1°F | Ht 74.0 in | Wt 186.6 lb

## 2021-05-03 DIAGNOSIS — I739 Peripheral vascular disease, unspecified: Secondary | ICD-10-CM

## 2021-05-03 NOTE — Progress Notes (Signed)
  POST OPERATIVE OFFICE NOTE    CC:  F/u for surgery  HPI:  This is a 55 y.o. male who presented with discoloration of his left second toe.   is s/p  Left common femoral endarterectomy with profundoplasty and bovine pericardial patch angioplasty and  Left above-knee popliteal angioplasty with stent placement (stented with 7 mm x 40 mm self-expanding Innova postdilated with a 6 mm x 40 mm Mustang)on 04/11/21 by Dr. Carlis Abbott.  Last surgery was left second toe amputation for ischemia 04/29/21.  He denise fever and chills.  Pt returns today for follow up.  Pt states he thinks the swelling has gone down some.    No Known Allergies  Current Outpatient Medications  Medication Sig Dispense Refill   acetaminophen (TYLENOL) 500 MG tablet Take 500 mg by mouth every 6 (six) hours as needed for mild pain, fever or headache.     aspirin EC 81 MG tablet Take 1 tablet (81 mg total) by mouth daily. Swallow whole. 365 tablet 0   atorvastatin (LIPITOR) 40 MG tablet Take 1 tablet (40 mg total) by mouth daily. 30 tablet 11   clopidogrel (PLAVIX) 75 MG tablet Take 1 tablet (75 mg total) by mouth daily at 6 (six) AM. 30 tablet 2   HYDROcodone-acetaminophen (NORCO) 5-325 MG tablet Take 1 tablet by mouth every 6 (six) hours as needed for moderate pain. 20 tablet 0   No current facility-administered medications for this visit.     ROS:  See HPI  Physical Exam:       Incision:  healing well Extremities:  palpable AT with doppler signals DP/PT/Peroneal on the left and right PT signals on the right. Neuro: intact, but decreased    Assessment/Plan:  This is a 55 y.o. male who is s/p:Left common femoral endarterectomy with profundoplasty and bovine pericardial patch angioplasty and  Left above-knee popliteal angioplasty with stent placement (stented with 7 mm x 40 mm self-expanding Innova postdilated with a 6 mm x 40 mm Mustang)on 04/11/21 by Dr. Carlis Abbott.  Last surgery was left second toe amputation for ischemia  04/29/21.  He denise fever and chills.  He is elevating some daily.    I reviewed the angiogram from 03/31/21   Findings:    Aortogram showed no flow-limiting stenosis in the aortoiliac segment and both renal arteries were widely patent   The left lower extremity which is the side of interest showed multiple high-grade calcified plaques in the left common femoral artery with greater than 80% stenosis.  The SFA is calcified but patent with no flow-limiting stenosis.  The above-knee popliteal artery has about a 60 to 70% calcified stenosis that is focal and the below-knee popliteal artery is patent and widely patent three-vessel runoff in the tibials.   Right lower extremity runoff shows a subtotally occluded right common femoral artery.  The SFA is calcified with moderate disease but patent.  Right above and below knee popliteal artery is patent.  He has three-vessel runoff in the right lower extremity with heavily diseased and calcified TP trunk and proximal peroneal artery.   I could get a PT doppler signal, but not a peroneal or DP today on exam.  He does not have rest pain, non healing wound or claudication on B LE's. I will bring him back for suture removal and ABI's.    Cont. ASA, Plavix and Lipitor daily.  [name] PA-C [facility] [phone]   Clinic MD:  Carlis Abbott

## 2021-05-05 ENCOUNTER — Other Ambulatory Visit: Payer: Self-pay

## 2021-05-05 DIAGNOSIS — I739 Peripheral vascular disease, unspecified: Secondary | ICD-10-CM

## 2021-05-31 ENCOUNTER — Ambulatory Visit (HOSPITAL_COMMUNITY)
Admission: RE | Admit: 2021-05-31 | Discharge: 2021-05-31 | Disposition: A | Discharge status: Home / Self Care | Payer: Medicaid Other | Source: Ambulatory Visit | Attending: Vascular Surgery | Admitting: Vascular Surgery

## 2021-05-31 ENCOUNTER — Other Ambulatory Visit: Payer: Self-pay

## 2021-05-31 ENCOUNTER — Ambulatory Visit (INDEPENDENT_AMBULATORY_CARE_PROVIDER_SITE_OTHER): Payer: Self-pay | Admitting: Physician Assistant

## 2021-05-31 VITALS — BP 145/95 | HR 88 | Temp 98.6°F | Resp 20 | Ht 74.0 in | Wt 187.0 lb

## 2021-05-31 DIAGNOSIS — I739 Peripheral vascular disease, unspecified: Secondary | ICD-10-CM

## 2021-05-31 NOTE — Progress Notes (Addendum)
POST OPERATIVE OFFICE NOTE    CC:  F/u for surgery  HPI:  This is a 55 y.o. male who is s/p  1.  Left common femoral endarterectomy with profundoplasty and bovine pericardial patch angioplasty 2.  Left lower extremity arteriogram with selection of third order branches 3.  Left above-knee popliteal angioplasty with stent placement (stented with 7 mm x 40 mm self-expanding Innova postdilated with a 6 mm x 40 mm Mustang) On 04/11/2021 by Dr. Carlis Abbott and subsequent left 3rd toe amputation on 04/29/2021 by Dr. Carlis Abbott.  Pt returns today for follow up.  Pt states his left leg is feeling better.  He does have some numbness and stinging in the medial left thigh, which is not uncommon.  He does have a wound on the 5th toe left plantar aspect that he is unaware of.  He does have some short distance claudication in the right leg but no non healing wounds.   No Known Allergies  Current Outpatient Medications  Medication Sig Dispense Refill   acetaminophen (TYLENOL) 500 MG tablet Take 500 mg by mouth every 6 (six) hours as needed for mild pain, fever or headache.     aspirin EC 81 MG tablet Take 1 tablet (81 mg total) by mouth daily. Swallow whole. 365 tablet 0   atorvastatin (LIPITOR) 40 MG tablet Take 1 tablet (40 mg total) by mouth daily. 30 tablet 11   clopidogrel (PLAVIX) 75 MG tablet Take 1 tablet (75 mg total) by mouth daily at 6 (six) AM. 30 tablet 2   HYDROcodone-acetaminophen (NORCO) 5-325 MG tablet Take 1 tablet by mouth every 6 (six) hours as needed for moderate pain. 20 tablet 0   No current facility-administered medications for this visit.     ROS:  See HPI  Physical Exam:  Today's Vitals   05/31/21 1425  BP: (!) 145/95  Pulse: 88  Resp: 20  Temp: 98.6 F (37 C)  TempSrc: Temporal  SpO2: 97%  Weight: 187 lb (84.8 kg)  Height: 6\' 2"  (1.88 m)   Body mass index is 24.01 kg/m.   Incision:  left groin healed nicely  Extremities:  palpable left femoral pulse and left PT;  monophasic doppler signals right DP/PT        ABI 05/31/2021 +-------+-----------+-----------+------------+------------+  ABI/TBIToday's ABIToday's TBIPrevious ABIPrevious TBI  +-------+-----------+-----------+------------+------------+  Right  0.63       0.43       0.52        0.35          +-------+-----------+-----------+------------+------------+  Left   1.32       0.46       0.9         0.34          +-------+-----------+-----------+------------+------------+     Assessment/Plan:  This is a 55 y.o. male who is s/p: 1.  Left common femoral endarterectomy with profundoplasty and bovine pericardial patch angioplasty 2.  Left lower extremity arteriogram with selection of third order branches 3.  Left above-knee popliteal angioplasty with stent placement (stented with 7 mm x 40 mm self-expanding Innova postdilated with a 6 mm x 40 mm Mustang) On 04/11/2021 by Dr. Carlis Abbott and subsequent left 3rd toe amputation on 04/29/2021 by Dr. Carlis Abbott  -his ABI/TBI are improved today -toe amp looks good today and close to being healed.  His sutures were removed.  He will shower with soap and water daily, dry his foot and paint toe amp site with betadine and apply dry dressing.   -  he does have palpable left PT pulse with triphasic doppler signal.   He does have an area on the left 5th toe plantar surface that will need to be watched carefully.  Pt did not know this was there-I took photo and showed him so he can keep an eye on this.  -he will come back in 2 weeks with LLE arterial duplex and we can check his toe amp site as well as left 5th toe. -as far as his right leg, Dr. Carlis Abbott talked with pt about letting his left leg and left groin incision heal as he would need to access the left groin to work on the right leg.  Pt expressed understanding.  He knows to keep an eye on his right foot as well for wounds.   -continue asa/statin/plavix -discussed importance of smoking cessation with pt.   He is trying to quit.    Leontine Locket, [facility] [phone]   Clinic MD:  pt see with Dr. Carlis Abbott

## 2021-06-01 ENCOUNTER — Other Ambulatory Visit: Payer: Self-pay

## 2021-06-01 DIAGNOSIS — I739 Peripheral vascular disease, unspecified: Secondary | ICD-10-CM

## 2021-06-14 ENCOUNTER — Ambulatory Visit (HOSPITAL_COMMUNITY)
Admission: RE | Admit: 2021-06-14 | Discharge: 2021-06-14 | Disposition: A | Discharge status: Home / Self Care | Payer: Self-pay | Source: Ambulatory Visit | Attending: Vascular Surgery | Admitting: Vascular Surgery

## 2021-06-14 ENCOUNTER — Ambulatory Visit (INDEPENDENT_AMBULATORY_CARE_PROVIDER_SITE_OTHER): Payer: Self-pay | Admitting: Physician Assistant

## 2021-06-14 ENCOUNTER — Other Ambulatory Visit: Payer: Self-pay

## 2021-06-14 VITALS — BP 152/104 | HR 110 | Temp 97.5°F | Resp 20 | Ht 74.0 in | Wt 178.2 lb

## 2021-06-14 DIAGNOSIS — L97521 Non-pressure chronic ulcer of other part of left foot limited to breakdown of skin: Secondary | ICD-10-CM

## 2021-06-14 DIAGNOSIS — I739 Peripheral vascular disease, unspecified: Secondary | ICD-10-CM

## 2021-06-14 NOTE — Progress Notes (Signed)
  POST OPERATIVE OFFICE NOTE    CC:  F/u for surgery  HPI:  This is a 55 y.o. male who is s/p 1.  Left common femoral endarterectomy with profundoplasty and bovine pericardial patch angioplasty 2.  Left lower extremity arteriogram with selection of third order branches 3.  Left above-knee popliteal angioplasty with stent placement (stented with 7 mm x 40 mm self-expanding Innova postdilated with a 6 mm x 40 mm Mustang) On 04/11/2021 by Dr. Carlis Abbott and subsequent left 3rd toe amputation on 04/29/2021 by Dr. Carlis Abbott  He returns today for follow-up of toe amp site and LLE arterial duplex. He arrived too late today to get his duplex study. He had ABIs performed 05/31/2021.  He is compliant with aspirin, statin, Plavix.  He continues to smoke.  He denies fever or chills.  No Known Allergies  Current Outpatient Medications  Medication Sig Dispense Refill   acetaminophen (TYLENOL) 500 MG tablet Take 500 mg by mouth every 6 (six) hours as needed for mild pain, fever or headache.     aspirin EC 81 MG tablet Take 1 tablet (81 mg total) by mouth daily. Swallow whole. 365 tablet 0   atorvastatin (LIPITOR) 40 MG tablet Take 1 tablet (40 mg total) by mouth daily. 30 tablet 11   clopidogrel (PLAVIX) 75 MG tablet Take 1 tablet (75 mg total) by mouth daily at 6 (six) AM. 30 tablet 2   HYDROcodone-acetaminophen (NORCO) 5-325 MG tablet Take 1 tablet by mouth every 6 (six) hours as needed for moderate pain. 20 tablet 0   No current facility-administered medications for this visit.     ROS:  See HPI  Vitals:   06/14/21 0839  BP: (!) 152/104  Pulse: (!) 110  Resp: 20  Temp: (!) 97.5 F (36.4 C)  SpO2: 96%     Physical Exam:  General appearance: Awake, alert in no apparent distress Cardiac: Heart rate and rhythm are regular Respirations: Nonlabored Incisions: Left groin incisions is well approximated without drainage or hematoma. Extremities: Both feet are warm with intact sensation and motor function.   Chronic arterial insufficiency changes noted.  Mild left foot edema.  Dusky discoloration of toes.  Left second toe amp incision continues to heal.  Stable left fifth toe pad ulcer.  No drainage. Pulse/Doppler exam: Palpable 2+ left femoral and popliteal pulses.  Monophasic left DP signal, biphasic left PT and peroneal signals.  Biphasic right PT signal.  Assessment/Plan:  This is a 55 y.o. male who is s/p: Left common femoral endarterectomy with profundoplasty and bovine pericardial patch angioplasty and left above-knee popliteal angioplasty with stent placement on April 11, 2021.  Left lower extremity well-perfused.  Toe amp site continues to heal.  Right lower extremity pain with decreased ABIs as well.  No tissue loss.  The patient will be rescheduled for his left lower extremity arterial duplex study in the next several weeks and he will follow-up with Dr. Carlis Abbott to discuss aortogram +/- intervention to the right lower extremity.  Keep left foot wounds clean and dry and monitor daily.  Continue aspirin Plavix and statin.  Encouraged complete tobacco cessation.  Risa Grill, PA-C [facility] [phone]  Clinic MD:  Dr. Carlis Abbott

## 2021-06-15 ENCOUNTER — Other Ambulatory Visit: Payer: Self-pay

## 2021-06-15 DIAGNOSIS — I739 Peripheral vascular disease, unspecified: Secondary | ICD-10-CM

## 2021-06-18 ENCOUNTER — Emergency Department (HOSPITAL_COMMUNITY): Payer: Medicaid Other

## 2021-06-18 ENCOUNTER — Emergency Department (HOSPITAL_COMMUNITY): Payer: Medicaid Other | Admitting: Certified Registered Nurse Anesthetist

## 2021-06-18 ENCOUNTER — Inpatient Hospital Stay (HOSPITAL_COMMUNITY)
Admission: EM | Admit: 2021-06-18 | Discharge: 2021-06-22 | DRG: 253 | Disposition: A | Discharge status: Home / Self Care | Payer: Medicaid Other | Source: Ambulatory Visit | Attending: Vascular Surgery | Admitting: Vascular Surgery

## 2021-06-18 ENCOUNTER — Encounter (HOSPITAL_COMMUNITY)
Admission: EM | Disposition: A | Discharge status: Home / Self Care | Payer: Self-pay | Source: Ambulatory Visit | Attending: Vascular Surgery

## 2021-06-18 ENCOUNTER — Encounter (HOSPITAL_COMMUNITY): Payer: Self-pay | Admitting: Emergency Medicine

## 2021-06-18 ENCOUNTER — Other Ambulatory Visit: Payer: Self-pay

## 2021-06-18 DIAGNOSIS — F17210 Nicotine dependence, cigarettes, uncomplicated: Secondary | ICD-10-CM | POA: Diagnosis present

## 2021-06-18 DIAGNOSIS — T45526A Underdosing of antithrombotic drugs, initial encounter: Secondary | ICD-10-CM | POA: Diagnosis present

## 2021-06-18 DIAGNOSIS — Z6822 Body mass index (BMI) 22.0-22.9, adult: Secondary | ICD-10-CM | POA: Diagnosis not present

## 2021-06-18 DIAGNOSIS — Z89422 Acquired absence of other left toe(s): Secondary | ICD-10-CM

## 2021-06-18 DIAGNOSIS — I7092 Chronic total occlusion of artery of the extremities: Secondary | ICD-10-CM | POA: Diagnosis present

## 2021-06-18 DIAGNOSIS — Z95820 Peripheral vascular angioplasty status with implants and grafts: Secondary | ICD-10-CM

## 2021-06-18 DIAGNOSIS — D62 Acute posthemorrhagic anemia: Secondary | ICD-10-CM | POA: Diagnosis not present

## 2021-06-18 DIAGNOSIS — Z20822 Contact with and (suspected) exposure to covid-19: Secondary | ICD-10-CM | POA: Diagnosis present

## 2021-06-18 DIAGNOSIS — E871 Hypo-osmolality and hyponatremia: Secondary | ICD-10-CM | POA: Diagnosis present

## 2021-06-18 DIAGNOSIS — I70221 Atherosclerosis of native arteries of extremities with rest pain, right leg: Principal | ICD-10-CM | POA: Diagnosis present

## 2021-06-18 DIAGNOSIS — T39396A Underdosing of other nonsteroidal anti-inflammatory drugs [NSAID], initial encounter: Secondary | ICD-10-CM | POA: Diagnosis present

## 2021-06-18 DIAGNOSIS — F1010 Alcohol abuse, uncomplicated: Secondary | ICD-10-CM | POA: Diagnosis present

## 2021-06-18 DIAGNOSIS — Z7982 Long term (current) use of aspirin: Secondary | ICD-10-CM

## 2021-06-18 DIAGNOSIS — Z8249 Family history of ischemic heart disease and other diseases of the circulatory system: Secondary | ICD-10-CM

## 2021-06-18 DIAGNOSIS — Z9889 Other specified postprocedural states: Secondary | ICD-10-CM

## 2021-06-18 DIAGNOSIS — E872 Acidosis, unspecified: Secondary | ICD-10-CM | POA: Diagnosis present

## 2021-06-18 DIAGNOSIS — Z79899 Other long term (current) drug therapy: Secondary | ICD-10-CM

## 2021-06-18 DIAGNOSIS — Z8701 Personal history of pneumonia (recurrent): Secondary | ICD-10-CM

## 2021-06-18 DIAGNOSIS — Z9114 Patient's other noncompliance with medication regimen: Secondary | ICD-10-CM

## 2021-06-18 DIAGNOSIS — I10 Essential (primary) hypertension: Secondary | ICD-10-CM | POA: Diagnosis present

## 2021-06-18 DIAGNOSIS — Z7902 Long term (current) use of antithrombotics/antiplatelets: Secondary | ICD-10-CM | POA: Diagnosis not present

## 2021-06-18 DIAGNOSIS — Z91128 Patient's intentional underdosing of medication regimen for other reason: Secondary | ICD-10-CM

## 2021-06-18 DIAGNOSIS — E440 Moderate protein-calorie malnutrition: Secondary | ICD-10-CM | POA: Diagnosis present

## 2021-06-18 DIAGNOSIS — I998 Other disorder of circulatory system: Secondary | ICD-10-CM | POA: Diagnosis present

## 2021-06-18 DIAGNOSIS — I743 Embolism and thrombosis of arteries of the lower extremities: Secondary | ICD-10-CM

## 2021-06-18 HISTORY — PX: PATCH ANGIOPLASTY: SHX6230

## 2021-06-18 HISTORY — PX: FASCIOTOMY: SHX132

## 2021-06-18 HISTORY — PX: ENDARTERECTOMY FEMORAL: SHX5804

## 2021-06-18 HISTORY — PX: THROMBECTOMY FEMORAL ARTERY: SHX6406

## 2021-06-18 LAB — CBC WITH DIFFERENTIAL/PLATELET
Abs Immature Granulocytes: 0.13 10*3/uL — ABNORMAL HIGH (ref 0.00–0.07)
Basophils Absolute: 0 10*3/uL (ref 0.0–0.1)
Basophils Relative: 0 %
Eosinophils Absolute: 0 10*3/uL (ref 0.0–0.5)
Eosinophils Relative: 0 %
HCT: 47.4 % (ref 39.0–52.0)
Hemoglobin: 16.1 g/dL (ref 13.0–17.0)
Immature Granulocytes: 1 %
Lymphocytes Relative: 6 %
Lymphs Abs: 0.8 10*3/uL (ref 0.7–4.0)
MCH: 33.4 pg (ref 26.0–34.0)
MCHC: 34 g/dL (ref 30.0–36.0)
MCV: 98.3 fL (ref 80.0–100.0)
Monocytes Absolute: 0.7 10*3/uL (ref 0.1–1.0)
Monocytes Relative: 5 %
Neutro Abs: 12.2 10*3/uL — ABNORMAL HIGH (ref 1.7–7.7)
Neutrophils Relative %: 88 %
Platelets: 193 10*3/uL (ref 150–400)
RBC: 4.82 MIL/uL (ref 4.22–5.81)
RDW: 12.7 % (ref 11.5–15.5)
WBC: 13.8 10*3/uL — ABNORMAL HIGH (ref 4.0–10.5)
nRBC: 0 % (ref 0.0–0.2)

## 2021-06-18 LAB — PROTIME-INR
INR: 1 (ref 0.8–1.2)
Prothrombin Time: 13.5 seconds (ref 11.4–15.2)

## 2021-06-18 LAB — COMPREHENSIVE METABOLIC PANEL
ALT: 15 U/L (ref 0–44)
AST: 23 U/L (ref 15–41)
Albumin: 3.1 g/dL — ABNORMAL LOW (ref 3.5–5.0)
Alkaline Phosphatase: 92 U/L (ref 38–126)
Anion gap: 14 (ref 5–15)
BUN: 6 mg/dL (ref 6–20)
CO2: 22 mmol/L (ref 22–32)
Calcium: 9 mg/dL (ref 8.9–10.3)
Chloride: 93 mmol/L — ABNORMAL LOW (ref 98–111)
Creatinine, Ser: 1.19 mg/dL (ref 0.61–1.24)
GFR, Estimated: >60 mL/min (ref >60)
Glucose, Bld: 124 mg/dL — ABNORMAL HIGH (ref 70–99)
Potassium: 4.7 mmol/L (ref 3.5–5.1)
Sodium: 129 mmol/L — ABNORMAL LOW (ref 135–145)
Total Bilirubin: 1.7 mg/dL — ABNORMAL HIGH (ref 0.3–1.2)
Total Protein: 7.9 g/dL (ref 6.5–8.1)

## 2021-06-18 LAB — TYPE AND SCREEN
ABO/RH(D): O NEG
Antibody Screen: NEGATIVE

## 2021-06-18 LAB — RESP PANEL BY RT-PCR (FLU A&B, COVID) ARPGX2
Influenza A by PCR: NEGATIVE
Influenza B by PCR: NEGATIVE
SARS Coronavirus 2 by RT PCR: NEGATIVE

## 2021-06-18 LAB — LACTIC ACID, PLASMA: Lactic Acid, Venous: 2.3 mmol/L (ref 0.5–1.9)

## 2021-06-18 LAB — APTT: aPTT: 26 seconds (ref 24–36)

## 2021-06-18 SURGERY — ENDARTERECTOMY, FEMORAL
Anesthesia: General | Site: Groin | Laterality: Right

## 2021-06-18 MED ORDER — IOHEXOL 350 MG/ML SOLN
125.0000 mL | Freq: Once | INTRAVENOUS | Status: AC | PRN
  Administered 2021-06-18: 125 mL via INTRAVENOUS

## 2021-06-18 MED ORDER — CEFAZOLIN SODIUM-DEXTROSE 2-4 GM/100ML-% IV SOLN
2.0000 g | Freq: Three times a day (TID) | INTRAVENOUS | Status: AC
  Administered 2021-06-19: 2 g via INTRAVENOUS
  Filled 2021-06-18 (×2): qty 100

## 2021-06-18 MED ORDER — CEFAZOLIN SODIUM-DEXTROSE 2-4 GM/100ML-% IV SOLN
2.0000 g | Freq: Once | INTRAVENOUS | Status: AC
  Administered 2021-06-18: 2 g via INTRAVENOUS

## 2021-06-18 MED ORDER — CLOPIDOGREL BISULFATE 75 MG PO TABS
75.0000 mg | ORAL_TABLET | Freq: Every day | ORAL | Status: DC
  Administered 2021-06-19 – 2021-06-22 (×4): 75 mg via ORAL
  Filled 2021-06-18 (×4): qty 1

## 2021-06-18 MED ORDER — HEPARIN SODIUM (PORCINE) 1000 UNIT/ML IJ SOLN
INTRAMUSCULAR | Status: DC | PRN
  Administered 2021-06-18: 8000 [IU] via INTRAVENOUS

## 2021-06-18 MED ORDER — EPHEDRINE 5 MG/ML INJ
INTRAVENOUS | Status: AC
  Filled 2021-06-18: qty 10

## 2021-06-18 MED ORDER — DEXAMETHASONE SODIUM PHOSPHATE 10 MG/ML IJ SOLN
INTRAMUSCULAR | Status: DC | PRN
  Administered 2021-06-18: 10 mg via INTRAVENOUS

## 2021-06-18 MED ORDER — MAGNESIUM SULFATE 2 GM/50ML IV SOLN: 2.0000 g | Freq: Every day | INTRAVENOUS | Status: DC | PRN

## 2021-06-18 MED ORDER — ATORVASTATIN CALCIUM 40 MG PO TABS
40.0000 mg | ORAL_TABLET | Freq: Every day | ORAL | Status: DC
  Administered 2021-06-19 – 2021-06-22 (×4): 40 mg via ORAL
  Filled 2021-06-18 (×4): qty 1

## 2021-06-18 MED ORDER — GUAIFENESIN-DM 100-10 MG/5ML PO SYRP: 15.0000 mL | ORAL_SOLUTION | ORAL | Status: DC | PRN

## 2021-06-18 MED ORDER — CEFAZOLIN SODIUM-DEXTROSE 2-4 GM/100ML-% IV SOLN
INTRAVENOUS | Status: AC
  Filled 2021-06-18: qty 100

## 2021-06-18 MED ORDER — ATROPINE SULFATE 0.4 MG/ML IV SOLN
INTRAVENOUS | Status: AC
  Filled 2021-06-18: qty 1

## 2021-06-18 MED ORDER — SODIUM CHLORIDE 0.9 % IV SOLN: 500.0000 mL | Freq: Once | INTRAVENOUS | Status: DC | PRN

## 2021-06-18 MED ORDER — PHENYLEPHRINE HCL-NACL 20-0.9 MG/250ML-% IV SOLN
INTRAVENOUS | Status: DC | PRN
  Administered 2021-06-18: 25 ug/min via INTRAVENOUS

## 2021-06-18 MED ORDER — LIDOCAINE 2% (20 MG/ML) 5 ML SYRINGE
INTRAMUSCULAR | Status: DC | PRN
  Administered 2021-06-18: 100 mg via INTRAVENOUS

## 2021-06-18 MED ORDER — ONDANSETRON HCL 4 MG/2ML IJ SOLN
INTRAMUSCULAR | Status: DC | PRN
  Administered 2021-06-18: 4 mg via INTRAVENOUS

## 2021-06-18 MED ORDER — ONDANSETRON HCL 4 MG/2ML IJ SOLN
INTRAMUSCULAR | Status: AC
  Filled 2021-06-18: qty 4

## 2021-06-18 MED ORDER — ASPIRIN EC 81 MG PO TBEC
81.0000 mg | DELAYED_RELEASE_TABLET | Freq: Every day | ORAL | Status: DC
  Administered 2021-06-19 – 2021-06-22 (×4): 81 mg via ORAL
  Filled 2021-06-18 (×4): qty 1

## 2021-06-18 MED ORDER — HYDROMORPHONE HCL 1 MG/ML IJ SOLN
0.5000 mg | Freq: Once | INTRAMUSCULAR | Status: AC
  Administered 2021-06-18: 0.5 mg via INTRAVENOUS
  Filled 2021-06-18: qty 1

## 2021-06-18 MED ORDER — HEPARIN BOLUS VIA INFUSION
4500.0000 [IU] | Freq: Once | INTRAVENOUS | Status: AC
  Administered 2021-06-18: 4500 [IU] via INTRAVENOUS
  Filled 2021-06-18: qty 4500

## 2021-06-18 MED ORDER — 0.9 % SODIUM CHLORIDE (POUR BTL) OPTIME
TOPICAL | Status: DC | PRN
  Administered 2021-06-18: 2000 mL

## 2021-06-18 MED ORDER — PANTOPRAZOLE SODIUM 40 MG PO TBEC
40.0000 mg | DELAYED_RELEASE_TABLET | Freq: Every day | ORAL | Status: DC
  Administered 2021-06-19 – 2021-06-22 (×4): 40 mg via ORAL
  Filled 2021-06-18 (×4): qty 1

## 2021-06-18 MED ORDER — ORAL CARE MOUTH RINSE: 15.0000 mL | Freq: Once | OROMUCOSAL | Status: AC

## 2021-06-18 MED ORDER — SODIUM CHLORIDE 0.9 % IV SOLN: INTRAVENOUS | Status: DC

## 2021-06-18 MED ORDER — MIDAZOLAM HCL 2 MG/2ML IJ SOLN
INTRAMUSCULAR | Status: AC
  Filled 2021-06-18: qty 2

## 2021-06-18 MED ORDER — DOCUSATE SODIUM 100 MG PO CAPS
100.0000 mg | ORAL_CAPSULE | Freq: Every day | ORAL | Status: DC
  Administered 2021-06-19 – 2021-06-22 (×4): 100 mg via ORAL
  Filled 2021-06-18 (×4): qty 1

## 2021-06-18 MED ORDER — LACTATED RINGERS IV SOLN: INTRAVENOUS | Status: DC

## 2021-06-18 MED ORDER — HEPARIN (PORCINE) 25000 UT/250ML-% IV SOLN: 500.0000 [IU]/h | INTRAVENOUS | Status: DC

## 2021-06-18 MED ORDER — FENTANYL CITRATE (PF) 100 MCG/2ML IJ SOLN: 25.0000 ug | INTRAMUSCULAR | Status: DC | PRN

## 2021-06-18 MED ORDER — MORPHINE SULFATE (PF) 2 MG/ML IV SOLN
2.0000 mg | INTRAVENOUS | Status: DC | PRN
  Administered 2021-06-18 – 2021-06-21 (×13): 2 mg via INTRAVENOUS
  Filled 2021-06-18 (×13): qty 1

## 2021-06-18 MED ORDER — CHLORHEXIDINE GLUCONATE 0.12 % MT SOLN: 15.0000 mL | Freq: Once | OROMUCOSAL | Status: AC

## 2021-06-18 MED ORDER — PROPOFOL 10 MG/ML IV BOLUS
INTRAVENOUS | Status: AC
  Filled 2021-06-18: qty 20

## 2021-06-18 MED ORDER — ACETAMINOPHEN 325 MG PO TABS: 325.0000 mg | ORAL_TABLET | ORAL | Status: DC | PRN

## 2021-06-18 MED ORDER — SUCCINYLCHOLINE CHLORIDE 200 MG/10ML IV SOSY
PREFILLED_SYRINGE | INTRAVENOUS | Status: AC
  Filled 2021-06-18: qty 30

## 2021-06-18 MED ORDER — SUCCINYLCHOLINE CHLORIDE 200 MG/10ML IV SOSY
PREFILLED_SYRINGE | INTRAVENOUS | Status: DC | PRN
  Administered 2021-06-18: 130 mg via INTRAVENOUS

## 2021-06-18 MED ORDER — POTASSIUM CHLORIDE CRYS ER 20 MEQ PO TBCR: 20.0000 meq | EXTENDED_RELEASE_TABLET | Freq: Every day | ORAL | Status: DC | PRN

## 2021-06-18 MED ORDER — HEPARIN (PORCINE) 25000 UT/250ML-% IV SOLN
1300.0000 [IU]/h | INTRAVENOUS | Status: DC
  Administered 2021-06-18: 1300 [IU]/h via INTRAVENOUS
  Filled 2021-06-18: qty 250

## 2021-06-18 MED ORDER — ROCURONIUM BROMIDE 10 MG/ML (PF) SYRINGE
PREFILLED_SYRINGE | INTRAVENOUS | Status: DC | PRN
Start: 2021-06-18 — End: 2021-06-18
  Administered 2021-06-18: 10 mg via INTRAVENOUS
  Administered 2021-06-18: 20 mg via INTRAVENOUS
  Administered 2021-06-18: 50 mg via INTRAVENOUS
  Administered 2021-06-18: 20 mg via INTRAVENOUS

## 2021-06-18 MED ORDER — HYDROMORPHONE HCL 1 MG/ML IJ SOLN
1.0000 mg | Freq: Once | INTRAMUSCULAR | Status: AC
Start: 2021-06-18 — End: 2021-06-18
  Administered 2021-06-18: 1 mg via INTRAVENOUS
  Filled 2021-06-18: qty 1

## 2021-06-18 MED ORDER — LABETALOL HCL 5 MG/ML IV SOLN: 10.0000 mg | INTRAVENOUS | Status: DC | PRN

## 2021-06-18 MED ORDER — ONDANSETRON HCL 4 MG/2ML IJ SOLN: 4.0000 mg | Freq: Four times a day (QID) | INTRAMUSCULAR | Status: DC | PRN

## 2021-06-18 MED ORDER — HEPARIN 6000 UNIT IRRIGATION SOLUTION
Status: DC | PRN
  Administered 2021-06-18: 1

## 2021-06-18 MED ORDER — HYDRALAZINE HCL 20 MG/ML IJ SOLN
5.0000 mg | INTRAMUSCULAR | Status: DC | PRN
Start: 2021-06-18 — End: 2021-06-22
  Administered 2021-06-18: 5 mg via INTRAVENOUS
  Filled 2021-06-18: qty 1

## 2021-06-18 MED ORDER — POLYETHYLENE GLYCOL 3350 17 G PO PACK
17.0000 g | PACK | Freq: Every day | ORAL | Status: DC | PRN
  Administered 2021-06-22: 17 g via ORAL
  Filled 2021-06-18: qty 1

## 2021-06-18 MED ORDER — OXYCODONE-ACETAMINOPHEN 5-325 MG PO TABS
1.0000 | ORAL_TABLET | ORAL | Status: DC | PRN
  Administered 2021-06-20 – 2021-06-21 (×9): 2 via ORAL
  Administered 2021-06-22: 1 via ORAL
  Administered 2021-06-22 (×3): 2 via ORAL
  Filled 2021-06-18 (×13): qty 2

## 2021-06-18 MED ORDER — PHENOL 1.4 % MT LIQD: 1.0000 | OROMUCOSAL | Status: DC | PRN

## 2021-06-18 MED ORDER — LACTATED RINGERS IV SOLN: INTRAVENOUS | Status: DC | PRN

## 2021-06-18 MED ORDER — SUGAMMADEX SODIUM 200 MG/2ML IV SOLN
INTRAVENOUS | Status: DC | PRN
  Administered 2021-06-18: 200 mg via INTRAVENOUS

## 2021-06-18 MED ORDER — CHLORHEXIDINE GLUCONATE 0.12 % MT SOLN
OROMUCOSAL | Status: AC
  Administered 2021-06-18: 15 mL via OROMUCOSAL
  Filled 2021-06-18: qty 15

## 2021-06-18 MED ORDER — ROCURONIUM BROMIDE 10 MG/ML (PF) SYRINGE
PREFILLED_SYRINGE | INTRAVENOUS | Status: AC
  Filled 2021-06-18: qty 20

## 2021-06-18 MED ORDER — METOPROLOL TARTRATE 5 MG/5ML IV SOLN: 2.0000 mg | INTRAVENOUS | Status: DC | PRN

## 2021-06-18 MED ORDER — PHENYLEPHRINE 40 MCG/ML (10ML) SYRINGE FOR IV PUSH (FOR BLOOD PRESSURE SUPPORT)
PREFILLED_SYRINGE | INTRAVENOUS | Status: AC
  Filled 2021-06-18: qty 10

## 2021-06-18 MED ORDER — BISACODYL 10 MG RE SUPP: 10.0000 mg | Freq: Every day | RECTAL | Status: DC | PRN

## 2021-06-18 MED ORDER — IPRATROPIUM-ALBUTEROL 20-100 MCG/ACT IN AERS
INHALATION_SPRAY | RESPIRATORY_TRACT | Status: DC | PRN
  Administered 2021-06-18: 3 via RESPIRATORY_TRACT

## 2021-06-18 MED ORDER — FENTANYL CITRATE (PF) 100 MCG/2ML IJ SOLN
INTRAMUSCULAR | Status: DC | PRN
  Administered 2021-06-18 (×5): 50 ug via INTRAVENOUS

## 2021-06-18 MED ORDER — MIDAZOLAM HCL 5 MG/5ML IJ SOLN
INTRAMUSCULAR | Status: DC | PRN
  Administered 2021-06-18: 2 mg via INTRAVENOUS

## 2021-06-18 MED ORDER — LIDOCAINE 2% (20 MG/ML) 5 ML SYRINGE
INTRAMUSCULAR | Status: AC
  Filled 2021-06-18: qty 10

## 2021-06-18 MED ORDER — ACETAMINOPHEN 650 MG RE SUPP: 325.0000 mg | RECTAL | Status: DC | PRN

## 2021-06-18 MED ORDER — ALUM & MAG HYDROXIDE-SIMETH 200-200-20 MG/5ML PO SUSP: 15.0000 mL | ORAL | Status: DC | PRN

## 2021-06-18 MED ORDER — PROPOFOL 10 MG/ML IV BOLUS
INTRAVENOUS | Status: DC | PRN
Start: 2021-06-18 — End: 2021-06-18
  Administered 2021-06-18: 160 mg via INTRAVENOUS

## 2021-06-18 MED ORDER — FENTANYL CITRATE (PF) 250 MCG/5ML IJ SOLN
INTRAMUSCULAR | Status: AC
  Filled 2021-06-18: qty 5

## 2021-06-18 SURGICAL SUPPLY — 70 items
BAG COUNTER SPONGE SURGICOUNT (BAG) ×2 IMPLANT
BANDAGE ESMARK 6X9 LF (GAUZE/BANDAGES/DRESSINGS) IMPLANT
BNDG CMPR 9X6 STRL LF SNTH (GAUZE/BANDAGES/DRESSINGS)
BNDG ELASTIC 4X5.8 VLCR STR LF (GAUZE/BANDAGES/DRESSINGS) IMPLANT
BNDG ELASTIC 6X5.8 VLCR STR LF (GAUZE/BANDAGES/DRESSINGS) IMPLANT
BNDG ESMARK 6X9 LF (GAUZE/BANDAGES/DRESSINGS)
BNDG GAUZE ELAST 4 BULKY (GAUZE/BANDAGES/DRESSINGS) ×4 IMPLANT
CANISTER SUCT 3000ML PPV (MISCELLANEOUS) ×2 IMPLANT
CATH EMB 3FR 80CM (CATHETERS) ×2 IMPLANT
CATH EMB 4FR 80CM (CATHETERS) ×2 IMPLANT
CLIP VESOCCLUDE MED 24/CT (CLIP) ×2 IMPLANT
CLIP VESOCCLUDE SM WIDE 24/CT (CLIP) ×2 IMPLANT
COVER PROBE W GEL 5X96 (DRAPES) ×2 IMPLANT
CUFF TOURN SGL QUICK 24 (TOURNIQUET CUFF)
CUFF TOURN SGL QUICK 34 (TOURNIQUET CUFF)
CUFF TOURN SGL QUICK 42 (TOURNIQUET CUFF) IMPLANT
CUFF TRNQT CYL 24X4X16.5-23 (TOURNIQUET CUFF) IMPLANT
CUFF TRNQT CYL 34X4.125X (TOURNIQUET CUFF) IMPLANT
DERMABOND ADVANCED (GAUZE/BANDAGES/DRESSINGS) ×1
DERMABOND ADVANCED .7 DNX12 (GAUZE/BANDAGES/DRESSINGS) ×1 IMPLANT
DRAIN CHANNEL 15F RND FF W/TCR (WOUND CARE) IMPLANT
DRAPE C-ARM 42X72 X-RAY (DRAPES) ×2 IMPLANT
DRAPE INCISE IOBAN 66X45 STRL (DRAPES) IMPLANT
ELECT REM PT RETURN 9FT ADLT (ELECTROSURGICAL) ×2
ELECTRODE REM PT RTRN 9FT ADLT (ELECTROSURGICAL) ×1 IMPLANT
EVACUATOR SILICONE 100CC (DRAIN) IMPLANT
GAUZE 4X4 16PLY ~~LOC~~+RFID DBL (SPONGE) ×2 IMPLANT
GAUZE SPONGE 4X4 12PLY STRL (GAUZE/BANDAGES/DRESSINGS) ×4 IMPLANT
GLOVE SRG 8 PF TXTR STRL LF DI (GLOVE) ×1 IMPLANT
GLOVE SURG ENC MOIS LTX SZ7.5 (GLOVE) ×2 IMPLANT
GLOVE SURG UNDER POLY LF SZ8 (GLOVE) ×2
GOWN STRL REUS W/ TWL LRG LVL3 (GOWN DISPOSABLE) ×3 IMPLANT
GOWN STRL REUS W/ TWL XL LVL3 (GOWN DISPOSABLE) ×2 IMPLANT
GOWN STRL REUS W/TWL LRG LVL3 (GOWN DISPOSABLE) ×6
GOWN STRL REUS W/TWL XL LVL3 (GOWN DISPOSABLE) ×4
HEMOSTAT SPONGE AVITENE ULTRA (HEMOSTASIS) IMPLANT
INSERT FOGARTY SM (MISCELLANEOUS) IMPLANT
KIT BASIN OR (CUSTOM PROCEDURE TRAY) ×2 IMPLANT
KIT TURNOVER KIT B (KITS) ×2 IMPLANT
NS IRRIG 1000ML POUR BTL (IV SOLUTION) ×4 IMPLANT
PACK GENERAL/GYN (CUSTOM PROCEDURE TRAY) ×2 IMPLANT
PACK PERIPHERAL VASCULAR (CUSTOM PROCEDURE TRAY) ×2 IMPLANT
PACK UNIVERSAL I (CUSTOM PROCEDURE TRAY) ×2 IMPLANT
PAD ARMBOARD 7.5X6 YLW CONV (MISCELLANEOUS) ×4 IMPLANT
PATCH VASC XENOSURE 1CMX6CM (Vascular Products) ×2 IMPLANT
PATCH VASC XENOSURE 1X6 (Vascular Products) ×1 IMPLANT
SPONGE T-LAP 18X18 ~~LOC~~+RFID (SPONGE) ×4 IMPLANT
STAPLER VISISTAT 35W (STAPLE) ×2 IMPLANT
STOPCOCK 4 WAY LG BORE MALE ST (IV SETS) IMPLANT
SUT ETHILON 3 0 PS 1 (SUTURE) IMPLANT
SUT GORETEX 5 0 TT13 24 (SUTURE) IMPLANT
SUT GORETEX 6.0 TT13 (SUTURE) IMPLANT
SUT MNCRL AB 4-0 PS2 18 (SUTURE) ×6 IMPLANT
SUT PROLENE 5 0 C 1 24 (SUTURE) ×2 IMPLANT
SUT PROLENE 6 0 BV (SUTURE) ×16 IMPLANT
SUT PROLENE 7 0 BV 1 (SUTURE) IMPLANT
SUT SILK 2 0 PERMA HAND 18 BK (SUTURE) IMPLANT
SUT SILK 3 0 (SUTURE)
SUT SILK 3-0 18XBRD TIE 12 (SUTURE) IMPLANT
SUT VIC AB 2-0 CT1 27 (SUTURE) ×4
SUT VIC AB 2-0 CT1 TAPERPNT 27 (SUTURE) ×2 IMPLANT
SUT VIC AB 2-0 CTX 36 (SUTURE) IMPLANT
SUT VIC AB 3-0 SH 27 (SUTURE) ×8
SUT VIC AB 3-0 SH 27X BRD (SUTURE) ×4 IMPLANT
SYR 3ML LL SCALE MARK (SYRINGE) ×2 IMPLANT
TOWEL GREEN STERILE (TOWEL DISPOSABLE) ×2 IMPLANT
TRAY FOLEY MTR SLVR 16FR STAT (SET/KITS/TRAYS/PACK) ×2 IMPLANT
TUBING EXTENTION W/L.L. (IV SETS) IMPLANT
UNDERPAD 30X36 HEAVY ABSORB (UNDERPADS AND DIAPERS) ×2 IMPLANT
WATER STERILE IRR 1000ML POUR (IV SOLUTION) ×2 IMPLANT

## 2021-06-18 NOTE — Discharge Instructions (Addendum)
 [facility]  Discharge instructions  Lower Extremity Bypass Surgery  Please refer to the following instruction for your post-procedure care. Your surgeon or physician assistant will discuss any changes with you.  Activity  You are encouraged to walk as much as you can. You can slowly return to normal activities during the month after your surgery. Avoid strenuous activity and heavy lifting until your doctor tells you it's OK. Avoid activities such as vacuuming or swinging a golf club. Do not drive until your doctor give the OK and you are no longer taking prescription pain medications. It is also normal to have difficulty with sleep habits, eating and bowel movement after surgery. These will go away with time.  Bathing/Showering  Shower daily after you go home. Do not soak in a bathtub, hot tub, or swim until the incision heals completely.  Incision Care  Clean your incision with mild soap and water. Shower every day. Pat the area dry with a clean towel. You do not need a bandage unless otherwise instructed. Do not apply any ointments or creams to your incision. If you have open wounds you will be instructed how to care for them or a visiting nurse may be arranged for you. If you have staples or sutures along your incision they will be removed at your post-op appointment. You may have skin glue on your incision. Do not peel it off. It will come off on its own in about one week.  Wash the groin wound with soap and water daily and pat dry. (No tub bath-only shower)  Then put a dry gauze or washcloth in the groin to keep this area dry to help prevent wound infection.  Do this daily and as needed.  Do not use Vaseline or neosporin on your incisions.  Only use soap and water on your incisions and then protect and keep dry.  Diet  Resume your normal diet. There are no special food restrictions following this procedure. A low fat/ low cholesterol diet is  recommended for all patients with vascular disease. In order to heal from your surgery, it is CRITICAL to get adequate nutrition. Your body requires vitamins, minerals, and protein. Vegetables are the best source of vitamins and minerals. Vegetables also provide the perfect balance of protein. Processed food has little nutritional value, so try to avoid this.  Medications  Resume taking all your medications unless your doctor or physician assistant tells you not to. If your incision is causing pain, you may take over-the-counter pain relievers such as acetaminophen (Tylenol). If you were prescribed a stronger pain medication, please aware these medication can cause nausea and constipation. Prevent nausea by taking the medication with a snack or meal. Avoid constipation by drinking plenty of fluids and eating foods with high amount of fiber, such as fruits, vegetables, and grains. Take Colace 100 mg (an over-the-counter stool softener) twice a day as needed for constipation.  Do not take Tylenol if you are taking prescription pain medications.  Follow Up  Our office will schedule a follow up appointment 2-3 weeks following discharge.  Please call us immediately for any of the following conditions  Severe or worsening pain in your legs or feet while at rest or while walking Increase pain, redness, warmth, or drainage (pus) from your incision site(s) Fever of 101 degree or higher The swelling in your leg with the bypass suddenly worsens and becomes more painful than when you were in the hospital If you have   been instructed to feel your graft pulse then you should do so every day. If you can no longer feel this pulse, call the office immediately. Not all patients are given this instruction.  Leg swelling is common after leg bypass surgery.  The swelling should improve over a few months following surgery. To improve the swelling, you may elevate your legs above the level of your heart while you are  sitting or resting. Your surgeon or physician assistant may ask you to apply an ACE wrap or wear compression (TED) stockings to help to reduce swelling.  Reduce your risk of vascular disease  Stop smoking. If you would like help call [facility] at 1-800-QUIT-NOW (516)405-3555) or [place] at [phone].  Manage your cholesterol Maintain a desired weight Control your diabetes weight Control your diabetes Keep your blood pressure down  If you have any questions, please call the office at [phone]  Information on my medicine - ELIQUIS (apixaban)  Why was Eliquis prescribed for you? Eliquis was prescribed to treat blood clots that may have been found in the veins of your legs (deep vein thrombosis) or in your lungs (pulmonary embolism) and to reduce the risk of them occurring again.  What do You need to know about Eliquis ? The starting dose is 10 mg (two 5 mg tablets) taken TWICE daily for the FIRST SEVEN (7) DAYS, then on 10/26 the dose is reduced to ONE 5 mg tablet taken TWICE daily.  Eliquis may be taken with or without food.   Try to take the dose about the same time in the morning and in the evening. If you have difficulty swallowing the tablet whole please discuss with your pharmacist how to take the medication safely.  Take Eliquis exactly as prescribed and DO NOT stop taking Eliquis without talking to the doctor who prescribed the medication.  Stopping may increase your risk of developing a new blood clot.  Refill your prescription before you run out.  After discharge, you should have regular check-up appointments with your healthcare provider that is prescribing your Eliquis.    What do you do if you miss a dose? If a dose of ELIQUIS is not taken at the scheduled time, take it as soon as possible on the same day and twice-daily administration should be resumed. The dose should not be doubled to make up for a missed dose.  Important Safety Information A  possible side effect of Eliquis is bleeding. You should call your healthcare provider right away if you experience any of the following: Bleeding from an injury or your nose that does not stop. Unusual colored urine (red or dark brown) or unusual colored stools (red or black). Unusual bruising for unknown reasons. A serious fall or if you hit your head (even if there is no bleeding).  Some medicines may interact with Eliquis and might increase your risk of bleeding or clotting while on Eliquis. To help avoid this, consult your healthcare provider or pharmacist prior to using any new prescription or non-prescription medications, including herbals, vitamins, non-steroidal anti-inflammatory drugs (NSAIDs) and supplements.  This website has more information on Eliquis (apixaban): http://www.eliquis.com/eliquis/home

## 2021-06-18 NOTE — ED Notes (Signed)
This RN unable to obtain pedal or tibial pulses with doppler, foot cold and discolored. PA called to bedside for evaluation

## 2021-06-18 NOTE — ED Provider Notes (Signed)
[facility] EMERGENCY DEPARTMENT Provider Note   CSN: [id number] Arrival date & time: 06/18/21  1343     History Chief Complaint  Patient presents with   Numbness   Leg Pain     Arthur Mcmahon is a 55 y.o. male.  55 y/o male with hx of HTN, tobacco use, and PAD on ASA/Plavix (hx LEFT common femoral endarterectomy with profundoplasty, left lower extremity arteriogram, and left above-knee popliteal artery stent placement in August 2022) presents to the ED for acute onset RLE pain. Pain has been constant, unchanged, severe. It begins in his foot and ankle and radiates proximally up to his buttock. Pain atraumatic in onset. No known modifying factors. Reports associated numbness to the distal extremity with weakness.  Patient is followed by Dr. Carlis Abbott Reports being NPO since 0930 today.  The history is provided by the patient. No language interpreter was used.      Past Medical History:  Diagnosis Date   Anemia    ETOH abuse    Hypertension    Peripheral vascular disease ([place])    Pneumonia     Patient Active Problem List   Diagnosis Date Noted   PAOD (peripheral arterial occlusive disease) ([place]) 04/11/2021   Tobacco use disorder 04/08/2021   Hematuria with proteinuria 03/26/2021   Peripheral arterial disease ([place]) 03/26/2021   Foot pain, bilateral 03/26/2021   Discoloration of skin of toe 03/26/2021    Past Surgical History:  Procedure Laterality Date   ABDOMINAL AORTOGRAM W/LOWER EXTREMITY Bilateral 03/31/2021   Procedure: ABDOMINAL AORTOGRAM W/LOWER EXTREMITY;  Surgeon: [name], MD;  Location: [place] [facility];  Service: Cardiovascular;  Laterality: Bilateral;   AMPUTATION Left 04/29/2021   Procedure: Left second Partial TOE AMPUTATION;  Surgeon: [name], MD;  Location: [place];  Service: Vascular;  Laterality: Left;   ENDARTERECTOMY FEMORAL Left 04/11/2021   Procedure: LEFT FEMORAL ENDARTERECTOMY;  Surgeon: [name], MD;  Location: [place];  Service: Vascular;  Laterality: Left;   FRACTURE SURGERY Right    femur   INSERTION OF ILIAC STENT Left 04/11/2021   Procedure: INSERTION OF LEFT ABOVE KNEE POPLITEAL STENT;  Surgeon: [name], MD;  Location: [place];  Service: Vascular;  Laterality: Left;   INTRAOPERATIVE ARTERIOGRAM Left 04/11/2021   Procedure: INTRA OPERATIVE ARTERIOGRAM;  Surgeon: [name], MD;  Location: [facility] OR;  Service: Vascular;  Laterality: Left;   PATCH ANGIOPLASTY Left 04/11/2021   Procedure: PATCH ANGIOPLASTY LEFT COMMON FEMORAL ARTERY, PROFUNDOPLASTY;  Surgeon: [name], MD;  Location: [facility];  Service: Vascular;  Laterality: Left;   TONSILLECTOMY         Family History  Problem Relation Age of Onset   Cancer Mother    Heart failure Mother    Cancer Father     Social History   Tobacco Use   Smoking status: Every Day    Packs/day: 0.50    Years: 25.00    Pack years: 12.50    Types: Cigarettes   Smokeless tobacco: Never  Vaping Use   Vaping Use: Never used  Substance Use Topics   Alcohol use: Not Currently    Comment: daily   Drug use: Yes    Types: Marijuana    Comment: 2-3 a week    Home Medications Prior to Admission medications   Medication Sig Start Date End Date Taking? Authorizing Provider  aspirin EC 81 MG tablet Take 1 tablet (81 mg total) by mouth  daily. Swallow whole. 03/31/21 03/31/22 Yes [name], MD  clopidogrel (PLAVIX) 75 MG tablet Take 1 tablet (75 mg total) by mouth daily at 6 (six) AM. 04/14/21  Yes Dagoberto Ligas, PA-C  acetaminophen (TYLENOL) 500 MG tablet Take 500 mg by mouth every 6 (six) hours as needed for mild pain, fever or headache.    [provider]  atorvastatin (LIPITOR) 40 MG tablet Take 1 tablet (40 mg total) by mouth daily. 03/31/21 03/31/22  [name], MD  HYDROcodone-acetaminophen (NORCO) 5-325 MG tablet Take 1 tablet by mouth every 6 (six) hours as needed for  moderate pain. 04/29/21   Gabriel Earing, PA-C    Allergies    Patient has no known allergies.  Review of Systems   Review of Systems Ten systems reviewed and are negative for acute change, except as noted in the HPI.    Physical Exam Updated Vital Signs BP (!) 210/111   Pulse 84   Temp 98.4 F (36.9 C) (Oral)   Resp 16   SpO2 95%   Physical Exam Vitals and nursing note reviewed.  Constitutional:      General: He is not in acute distress.    Appearance: He is well-developed. He is not diaphoretic.     Comments: Appears uncomfortable. Nontoxic.  HENT:     Head: Normocephalic and atraumatic.  Eyes:     General: No scleral icterus.    Conjunctiva/sclera: Conjunctivae normal.  Cardiovascular:     Rate and Rhythm: Normal rate and regular rhythm.     Pulses:          Dorsalis pedis pulses are 0 on the right side.       Posterior tibial pulses are 0 on the right side.     Comments: Unable to palpate or doppler DP or PT pulse in the RLE. Capillary refill is slowed, ~5 seconds in distal digits of the R foot. Pulmonary:     Effort: Pulmonary effort is normal. No respiratory distress.     Comments: Respirations even and unlabored Musculoskeletal:     Cervical back: Normal range of motion.     Comments: RLE is cool and mottled distal to the R knee. Minimal ability to dorsiflex or plantar flex the R foot against resistance. 3+/5 strength of the RLE with knee flexion and extension against resistance.  Skin:    General: Skin is warm and dry.     Coloration: Skin is mottled and pale.     Comments: See MSK  Neurological:     Mental Status: He is alert and oriented to person, place, and time.     Sensory: Sensory deficit present.     Motor: Weakness present.     Comments: Diminished sensation to light touch along the lateral aspect of the R foot. Absent sensation to light touch along the medial dorsal aspect of the R foot.  Psychiatric:        Behavior: Behavior normal.    ED  Results / Procedures / Treatments   Labs (all labs ordered are listed, but only abnormal results are displayed) Labs Reviewed  COMPREHENSIVE METABOLIC PANEL - Abnormal; Notable for the following components:      Result Value   Sodium 129 (*)    Chloride 93 (*)    Glucose, Bld 124 (*)    Albumin 3.1 (*)    Total Bilirubin 1.7 (*)    All other components within normal limits  CBC WITH DIFFERENTIAL/PLATELET - Abnormal; Notable for  the following components:   WBC 13.8 (*)    Neutro Abs 12.2 (*)    Abs Immature Granulocytes 0.13 (*)    All other components within normal limits  LACTIC ACID, PLASMA - Abnormal; Notable for the following components:   Lactic Acid, Venous 2.3 (*)    All other components within normal limits  RESP PANEL BY RT-PCR (FLU A&B, COVID) ARPGX2  PROTIME-INR  APTT  URINALYSIS, ROUTINE W REFLEX MICROSCOPIC  HEPARIN LEVEL (UNFRACTIONATED)  HEPARIN LEVEL (UNFRACTIONATED)  CBC    EKG None  Radiology CT Angio Aortobifemoral W and/or Wo Contrast  Result Date: 06/18/2021 CLINICAL DATA:  Right lower extremity, cold right leg EXAM: CT ANGIOGRAPHY OF ABDOMINAL AORTA WITH ILIOFEMORAL RUNOFF TECHNIQUE: Multidetector CT imaging of the abdomen, pelvis and lower extremities was performed using the standard protocol during bolus administration of intravenous contrast. Multiplanar CT image reconstructions and MIPs were obtained to evaluate the vascular anatomy. CONTRAST:  146mL OMNIPAQUE IOHEXOL 350 MG/ML SOLN COMPARISON:  None. FINDINGS: VASCULAR Aorta: Normal contour and caliber of the abdominal aorta. Moderate mixed abdominal aortic atherosclerosis. Celiac: Patent without evidence of aneurysm, dissection, vasculitis or significant stenosis. SMA: Patent without evidence of aneurysm, dissection, vasculitis or significant stenosis. Renals: Patent. Duplicated left renal arteries with a small accessory inferior pole left renal artery. Solitary right renal artery, however with a very  proximal branching inferior pole arterial. IMA: Patent without evidence of aneurysm, dissection, vasculitis or significant stenosis. RIGHT Lower Extremity Inflow: Common, internal and external iliac arteries are patent without evidence of aneurysm, dissection, vasculitis or significant stenosis. Outflow: There is short segment mixed atherosclerotic occlusion of the right common femoral artery (series 13, image 221). The proximal superficial femoral and profundal arteries are reconstituted, however the superficial femoral artery is occluded along its length from the midportion of the vessel, as is the popliteal artery. Runoff: No opacification of the right lower extremity runoff vessels. Scattered atherosclerotic calcifications. LEFT Lower Extremity Inflow: Common, internal and external iliac arteries are patent without evidence of aneurysm, dissection, vasculitis or significant stenosis. Outflow: Common, superficial and profunda femoral arteries and the popliteal artery are patent without evidence of aneurysm, dissection, vasculitis or significant stenosis. Moderate mixed calcific atherosclerosis. Patent stent of the left popliteal artery above the knee. Runoff: There is one-vessel runoff to the left ankle via the posterior tibial artery. The anterior tibial and peroneal arteries are extinguished above the ankle. Veins: Unremarkable in appearance on this limited arterial phase examination. Review of the MIP images confirms the above findings. NON-VASCULAR Lower chest: No acute abnormality. Hepatobiliary: No focal liver abnormality is seen. No gallstones, gallbladder wall thickening, or biliary dilatation. Pancreas: Unremarkable. No pancreatic ductal dilatation or surrounding inflammatory changes. Spleen: Normal in size without focal abnormality. Adrenals/Urinary Tract: Adrenal glands are unremarkable. Moderate right hydronephrosis and hydroureter with a delayed right nephrogram, consistent with obstruction of the  right ureterovesicular junction by mass. Severe, right eccentric and posterior wall thickening of the relatively decompressed urinary bladder, thickening up to 3.1 cm (series 13, image 207). Stomach/Bowel: Stomach is within normal limits. Appendix appears normal. No evidence of bowel wall thickening, distention, or inflammatory changes. Lymphatic: Numerous bulky retroperitoneal, bilateral iliac, and pelvic sidewall lymph nodes, largest retroperitoneal nodes or conglomerates measuring up to 3.8 x 2.6 cm (series 13, image 109), largest right iliac nodes measuring up to 3.8 x 3.3 cm (series 13, image 207). Reproductive: Prostate is unremarkable. Other: No abdominal wall hernia or abnormality. No abdominopelvic ascites. Musculoskeletal: No acute or significant osseous  findings. IMPRESSION: 1. There is short segment mixed atherosclerotic occlusion of the right common femoral artery. 2. The proximal right superficial femoral and profundal arteries are reconstituted, however the right superficial femoral artery is occluded along its length from the midportion of the vessel, as is the popliteal artery. 3. No opacification of the right lower extremity runoff vessels secondary to proximal occlusion. 4. Patent left popliteal artery stent. 5. There is one-vessel runoff to the left ankle via the posterior tibial artery. 6. Severe, right eccentric and posterior wall thickening of the relatively decompressed urinary bladder, thickening up to 3.1 cm. 7. Numerous bulky retroperitoneal, iliac, and pelvic sidewall lymph nodes. 8. Findings are consistent with primary bladder malignancy and nodal metastatic disease. 9. Moderate right hydronephrosis and hydroureter with a delayed right nephrogram, consistent with obstruction of the right ureterovesicular junction by mass. Aortic Atherosclerosis (ICD10-I70.0). Electronically Signed   By: Delanna Ahmadi M.D.   On: 06/18/2021 17:32    ABI Findings (05/31/21):   +---------+------------------+-----+----------+--------+  Right    Rt Pressure (mmHg)IndexWaveform  Comment   +---------+------------------+-----+----------+--------+  Brachial 136                                        +---------+------------------+-----+----------+--------+  PTA      85                0.62 monophasic          +---------+------------------+-----+----------+--------+  DP       75                0.55 monophasic          +---------+------------------+-----+----------+--------+  Great Toe59                0.43 Abnormal            +---------+------------------+-----+----------+--------+    Procedures .Critical Care Performed by: Antonietta Breach, PA-C Authorized by: Antonietta Breach, PA-C   Critical care provider statement:    Critical care time (minutes):  60   Critical care start time:  06/18/2021 4:22 PM   Critical care time was exclusive of:  Separately billable procedures and treating other patients   Critical care was necessary to treat or prevent imminent or life-threatening deterioration of the following conditions:  Circulatory failure   Critical care was time spent personally by me on the following activities:  Blood draw for specimens, development of treatment plan with patient or surrogate, discussions with consultants, examination of patient, evaluation of patient's response to treatment, obtaining history from patient or surrogate, ordering and performing treatments and interventions, ordering and review of laboratory studies, ordering and review of radiographic studies, re-evaluation of patient's condition and review of old charts   Care discussed with: admitting provider      Medications Ordered in ED Medications  heparin ADULT infusion 100 units/mL (25000 units/249mL) (0 Units/hr Intravenous Stopped 06/18/21 1724)  ceFAZolin (ANCEF) IVPB 2g/100 mL premix (has no administration in time range)  ceFAZolin (ANCEF) 2-4 GM/100ML-% IVPB  (has no administration in time range)  lactated ringers infusion ( Intravenous New Bag/Given 06/18/21 1742)  HYDROmorphone (DILAUDID) injection 1 mg (1 mg Intravenous Given 06/18/21 1617)  heparin bolus via infusion 4,500 Units (4,500 Units Intravenous Bolus from Bag 06/18/21 1650)  iohexol (OMNIPAQUE) 350 MG/ML injection 125 mL (125 mLs Intravenous Contrast Given 06/18/21 1646)  HYDROmorphone (DILAUDID) injection 0.5 mg (0.5 mg Intravenous Given 06/18/21  1700)  chlorhexidine (PERIDEX) 0.12 % solution 15 mL (15 mLs Mouth/Throat Given 06/18/21 1741)    Or  MEDLINE mouth rinse ( Mouth Rinse See Alternative 06/18/21 1741)    ED Course  I have reviewed the triage vital signs and the nursing notes.  Pertinent labs & imaging results that were available during my care of the patient were reviewed by me and considered in my medical decision making (see chart for details).  Clinical Course as of 06/18/21 1753  Sat Jun 18, 2021  1620 Dr. Carlis Abbott of [facility] aware of patient. Requests call back when CTA results; keep patient NPO pending imaging. [KH]  1632 Patient transported to CT Dr. Carlis Abbott called back with plan to see patient in the department shortly after imaging completed. [KH]  2446 Patient seen by Dr. Carlis Abbott in [place]. Per RN, plan is for operative management tonight. Formal CT read pending. [KH]    Clinical Course User Index [KH] Antonietta Breach, PA-C   MDM Rules/Calculators/A&P                           55 year old male to be admitted for management of acute right lower extremity ischemia.  No palpable or dopplerable pulse of the RLE on exam.  CT shows mixed atherosclerotic occlusion of the right common femoral artery.  Started on IV heparin and Dr. Carlis Abbott of [facility] consulted.  Plan for admission and operative management today.   Final Clinical Impression(s) / ED Diagnoses Final diagnoses:  Critical limb ischemia of right lower extremity [facility])    Rx / DC Orders ED Discharge Orders     None         Antonietta Breach, PA-C 06/18/21 1755    Tegeler, Gwenyth Allegra, MD 06/18/21 2352

## 2021-06-18 NOTE — H&P (Signed)
H&P    Reason for Consult: Ischemic right leg Referring Physician: ED MRN #:  [id number]  History of Present Illness: This is a 55 y.o. male with history of hypertension and tobacco abuse and peripheral vascular disease who presents to the ED with ischemic right leg.  Patient states he had acute onset of pain, numbness, and weakness in the right foot that started about 930 this morning.  States he was getting into his recliner.  Unable to move his foot or toes.  He was just seen in our office last week and was doing well.  On 04/11/2021 he underwent a left common femoral endarterectomy with bovine patch and then antegrade stenting of the left above-knee popliteal artery for tissue loss by myself.  Ultimately had a second toe amputation that healed.  He has no complaints in the left leg.  States he has not been compliant with his aspirin and Plavix.  Past Medical History:  Diagnosis Date   Anemia    ETOH abuse    Hypertension    Peripheral vascular disease ([place])    Pneumonia     Past Surgical History:  Procedure Laterality Date   ABDOMINAL AORTOGRAM W/LOWER EXTREMITY Bilateral 03/31/2021   Procedure: ABDOMINAL AORTOGRAM W/LOWER EXTREMITY;  Surgeon: [name], MD;  Location: [place] [facility];  Service: Cardiovascular;  Laterality: Bilateral;   AMPUTATION Left 04/29/2021   Procedure: Left second Partial TOE AMPUTATION;  Surgeon: [name], MD;  Location: [place];  Service: Vascular;  Laterality: Left;   ENDARTERECTOMY FEMORAL Left 04/11/2021   Procedure: LEFT FEMORAL ENDARTERECTOMY;  Surgeon: [name], MD;  Location: [place];  Service: Vascular;  Laterality: Left;   FRACTURE SURGERY Right    femur   INSERTION OF ILIAC STENT Left 04/11/2021   Procedure: INSERTION OF LEFT ABOVE KNEE POPLITEAL STENT;  Surgeon: [name], MD;  Location: [place];  Service: Vascular;  Laterality: Left;   INTRAOPERATIVE ARTERIOGRAM Left 04/11/2021   Procedure: INTRA OPERATIVE  ARTERIOGRAM;  Surgeon: [name], MD;  Location: [place];  Service: Vascular;  Laterality: Left;   PATCH ANGIOPLASTY Left 04/11/2021   Procedure: PATCH ANGIOPLASTY LEFT COMMON FEMORAL ARTERY, PROFUNDOPLASTY;  Surgeon: [name], MD;  [facility];  Service: Vascular;  Laterality: Left;   TONSILLECTOMY      No Known Allergies  Prior to Admission medications   Medication Sig Start Date End Date Taking? Authorizing Provider  acetaminophen (TYLENOL) 500 MG tablet Take 500 mg by mouth every 6 (six) hours as needed for mild pain, fever or headache.    [provider]  aspirin EC 81 MG tablet Take 1 tablet (81 mg total) by mouth daily. Swallow whole. 03/31/21 03/31/22  [name], MD  atorvastatin (LIPITOR) 40 MG tablet Take 1 tablet (40 mg total) by mouth daily. 03/31/21 03/31/22  [name], MD  clopidogrel (PLAVIX) 75 MG tablet Take 1 tablet (75 mg total) by mouth daily at 6 (six) AM. 04/14/21   Dagoberto Ligas, PA-C  HYDROcodone-acetaminophen (NORCO) 5-325 MG tablet Take 1 tablet by mouth every 6 (six) hours as needed for moderate pain. 04/29/21   Gabriel Earing, PA-C    Social History   Socioeconomic History   Marital status: Divorced    Spouse name: Not on file   Number of children: Not on file   Years of education: Not on file   Highest education level: Not on file  Occupational History   Not on  file  Tobacco Use   Smoking status: Every Day    Packs/day: 0.50    Years: 25.00    Pack years: 12.50    Types: Cigarettes   Smokeless tobacco: Never  Vaping Use   Vaping Use: Never used  Substance and Sexual Activity   Alcohol use: Not Currently    Comment: daily   Drug use: Yes    Types: Marijuana    Comment: 2-3 a week   Sexual activity: Not on file  Other Topics Concern   Not on file  Social History Narrative   Not on file   Social Determinants of Health   Financial Resource Strain: Not on file  Food Insecurity: Not on file   Transportation Needs: Not on file  Physical Activity: Not on file  Stress: Not on file  Social Connections: Not on file  Intimate Partner Violence: Not on file     Family History  Problem Relation Age of Onset   Cancer Mother    Heart failure Mother    Cancer Father     ROS: [x]  Positive   [ ]  Negative   [ ]  All sytems reviewed and are negative  Cardiovascular: []  chest pain/pressure []  palpitations []  SOB lying flat []  DOE []  pain in legs while walking []  pain in legs at rest []  pain in legs at night []  non-healing ulcers []  hx of DVT []  swelling in legs  Pulmonary: []  productive cough []  asthma/wheezing []  home O2  Neurologic: [x]  weakness in []  arms []  legs (right foot) [x]  numbness in []  arms []  legs (right foot) []  hx of CVA []  mini stroke [] difficulty speaking or slurred speech []  temporary loss of vision in one eye []  dizziness  Hematologic: []  hx of cancer []  bleeding problems []  problems with blood clotting easily  Endocrine:   []  diabetes []  thyroid disease  GI []  vomiting blood []  blood in stool  GU: []  CKD/renal failure []  HD--[]  M/W/F or []  T/T/S []  burning with urination []  blood in urine  Psychiatric: []  anxiety []  depression  Musculoskeletal: []  arthritis []  joint pain  Integumentary: []  rashes []  ulcers  Constitutional: []  fever []  chills   Physical Examination  Vitals:   06/18/21 1457 06/18/21 1550  BP: (!) 199/124 (!) 185/115  Pulse: 95 84  Resp: 16 16  Temp: 98.4 F (36.9 C)   SpO2: 100% 98%   There is no height or weight on file to calculate BMI.  General:  NAD Gait: Not observed HENT: WNL, normocephalic Pulmonary: normal non-labored breathing, without Rales, rhonchi,  wheezing Cardiac: regular, without  Murmurs, rubs or gallops Abdomen: soft, NT/ND Vascular Exam/Pulses: No palpable right femoral pulse Left femoral pulse 2+ No signals in right foot Left PT brisk by Doppler Extremities: Scheming  changes of the right foot that appears mottled Musculoskeletal: no muscle wasting or atrophy  Neurologic: A&O X 3; Appropriate Affect ; no motor or sensation in right foot   CBC    Component Value Date/Time   WBC 13.8 (H) 06/18/2021 1523   RBC 4.82 06/18/2021 1523   HGB 16.1 06/18/2021 1523   HCT 47.4 06/18/2021 1523   PLT 193 06/18/2021 1523   MCV 98.3 06/18/2021 1523   MCH 33.4 06/18/2021 1523   MCHC 34.0 06/18/2021 1523   RDW 12.7 06/18/2021 1523   LYMPHSABS 0.8 06/18/2021 1523   MONOABS 0.7 06/18/2021 1523   EOSABS 0.0 06/18/2021 1523   BASOSABS 0.0 06/18/2021 1523    BMET  Component Value Date/Time   NA 129 (L) 06/18/2021 1523   K 4.7 06/18/2021 1523   CL 93 (L) 06/18/2021 1523   CO2 22 06/18/2021 1523   GLUCOSE 124 (H) 06/18/2021 1523   BUN 6 06/18/2021 1523   CREATININE 1.19 06/18/2021 1523   CALCIUM 9.0 06/18/2021 1523   GFRNONAA >60 06/18/2021 1523   GFRAA >60 12/01/2019 2208    COAGS: Lab Results  Component Value Date   INR 1.0 06/18/2021   INR 1.1 04/11/2021   INR 0.9 03/22/2021     Non-Invasive Vascular Imaging:    CTA reviewed and right common femoral artery occluded with chronic disease and acute appearing thrombus and no distal flow   ASSESSMENT/PLAN: This is a 55 y.o. male presents with acutely ischemic right leg.  He has known common femoral disease and on review of the CT his common femoral appears completely occluded with chronic disease and acute appearing thrombus.  Discussed plan for right common femoral endarterectomy with patch and right leg thrombectomy and possible fasciotomies.  Discussed this is limb threatening situation.    Recent revascularization of the left leg appears to doing quite well.  The left groin is healed and he has a palpable left femoral pulse on that side with brisk Doppler signals in the left foot.  [name], MD [facility] Office: [place]

## 2021-06-18 NOTE — Anesthesia Postprocedure Evaluation (Signed)
Anesthesia Post Note  Patient: Arthur Mcmahon  Procedure(s) Performed: ENDARTERECTOMY RIGHT FEMORAL ARTERY (Right: Groin) THROMBECTOMY RIGHT LOWER EXTREMITY (Right: Groin) RIGHT LOWER EXTREMITY FASCIOTOMY (Right: Groin) PATCH ANGIOPLASTY USING XENOSURE BIOLOGIC PATCH (Right: Groin)     Patient location during evaluation: PACU Anesthesia Type: General Level of consciousness: awake Pain management: pain level controlled Respiratory status: spontaneous breathing Cardiovascular status: stable Postop Assessment: no apparent nausea or vomiting Anesthetic complications: no   No notable events documented.  Last Vitals:  Vitals:   06/18/21 2135 06/18/21 2150  BP: (!) 141/89 (!) 153/95  Pulse: 77 74  Resp: 20 15  Temp:  36.7 C  SpO2: 98% 99%    Last Pain:  Vitals:   06/18/21 2150  TempSrc:   PainSc: 0-No pain                 Nylan Nakatani

## 2021-06-18 NOTE — Anesthesia Procedure Notes (Signed)
Procedure Name: Intubation Date/Time: 06/18/2021 6:06 PM Performed by: Suzy Bouchard, CRNA Pre-anesthesia Checklist: Patient identified, Emergency Drugs available, Suction available, Patient being monitored and Timeout performed Patient Re-evaluated:Patient Re-evaluated prior to induction Oxygen Delivery Method: Circle system utilized Preoxygenation: Pre-oxygenation with 100% oxygen Induction Type: IV induction and Rapid sequence Laryngoscope Size: Miller and 2 Grade View: Grade I Tube type: Oral Tube size: 7.5 mm Number of attempts: 1 Airway Equipment and Method: Stylet Placement Confirmation: ETT inserted through vocal cords under direct vision, positive ETCO2 and breath sounds checked- equal and bilateral Secured at: 23 cm Tube secured with: Tape Dental Injury: Teeth and Oropharynx as per pre-operative assessment

## 2021-06-18 NOTE — Anesthesia Preprocedure Evaluation (Addendum)
Anesthesia Evaluation  Patient identified by MRN, date of birth, ID band Patient awake    Reviewed: Allergy & Precautions, NPO status , Patient's Chart, lab work & pertinent test results  Airway Mallampati: II  TM Distance: >3 FB     Dental  (+) Dental Advisory Given, Poor Dentition, Missing,    Pulmonary Current Smoker,    breath sounds clear to auscultation       Cardiovascular hypertension, + Peripheral Vascular Disease   Rhythm:Regular Rate:Normal     Neuro/Psych    GI/Hepatic negative GI ROS, Neg liver ROS,   Endo/Other  negative endocrine ROS  Renal/GU negative Renal ROS     Musculoskeletal   Abdominal   Peds  Hematology   Anesthesia Other Findings   Reproductive/Obstetrics                            Anesthesia Physical Anesthesia Plan  ASA: 3  Anesthesia Plan: General   Post-op Pain Management:    Induction: Intravenous  PONV Risk Score and Plan: 2 and Ondansetron, Dexamethasone and Midazolam  Airway Management Planned: Oral ETT  Additional Equipment:   Intra-op Plan:   Post-operative Plan: Extubation in OR  Informed Consent: I have reviewed the patients History and Physical, chart, labs and discussed the procedure including the risks, benefits and alternatives for the proposed anesthesia with the patient or authorized representative who has indicated his/her understanding and acceptance.     Dental advisory given  Plan Discussed with:   Anesthesia Plan Comments:         Anesthesia Quick Evaluation

## 2021-06-18 NOTE — ED Notes (Signed)
Patient transported to CT 

## 2021-06-18 NOTE — Op Note (Signed)
Date: June 18, 2021  Preoperative diagnosis: Acute on chronic ischemia of the right lower extremity  Postoperative diagnosis: Same  Procedure: 1.  Right common femoral endarterectomy including profundoplasty with bovine pericardial patch angioplasty 2.  Right lower extremity thrombectomy from femoral approach 3.  Right lower extremity 4 compartment fasciotomy  Surgeon: Dr. [name], MD  Assistant: Leontine Locket, PA  Indications: Patient is a 55 year old male well-known to vascular surgery who has previously undergone left lower extremity intervention including common femoral endarterectomy and SFA stenting.  He presented to the ED today with acute onset right foot pain with motor and sensory deficit.  CTA showed right common femoral occlusion with no distal runoff.  He was taken emergently to the operating room for common femoral endarterectomy and thrombectomy and fasciotomies after risk benefits discussed.  Findings: The right common femoral artery was completely occluded and was heavily calcified.  Ultimately the common femoral was endarterectomized including the distal external iliac and eversion endarterectomy of the profunda with excellent back bleeding.  We stopped the endarterectomy at the takeoff of the SFA given there was no good distal endpoint as far as we could dissect out the SFA.  This endpoint was tacked down.  I was able to pass #3 Fogarty's all the way into the foot and made a total of 4 passes retrieving multiple plugs of long acute thrombus.  We then had good backbleeding from the SFA and profunda with excellent inflow.  Bovine pericardial patch was sewn to the right common femoral artery.  Palpable right femoral pulse.  Right lower extremity 4 compartment fasciotomies were performed.  There was a monophasic PT signal at completion  Anesthesia: General  Details: Patient was taken to the operating room after informed consent was obtained.  Placed on the  operative table in supine position.  Right groin and right leg were then prepped and draped in standard sterile fashion.  Antibiotics were given and timeout was performed.  Initially made a horizontal groin incision in the right groin and dissected down through the subcutaneous tissue and opened the femoral sheath and the common femoral was dissected out including the proximal SFA and profunda and distal external iliac.  All these were controlled with Vesseloops.  The common femoral had no pulse and was severely calcified.  Patient was then given 100 units/kg IV heparin.  ACT was checked to maintain greater than 250.  SFA profunda were controlled with Vesseloops and the distal external iliac was controlled with a Henley clamp.  Common femorals was opened with 11 blade scalpel extended Potts scissors.  Endarterectomy was then performed of the common femoral including the distal external iliac and profunda and this was stopped at the takeoff of the SFA given no good endpoint.  We could see the true lumen of the SFA and the endpoint here was tacked down with multiple 6-0 Prolene sutures.  I then passed a #3 Fogarty down the SFA and made a total of 4 passes getting multiple plugs of acute thrombus.  Finally on the fifth pass there was nothing retrieved and we passed this into the foot.  We had backbleeding from the SFA and the profunda.  At this time had excellent inflow.  Bovine pericardial patch was brought on the field and sewn in place 5-0 Prolene parachute technique and everything was de-aired prior to completion.  We then had a palpable right femoral pulse.  We then turned our attention to the right calf where medial and lateral longitudinal incisions  were made over the calf muscles.  Laterally we opened the anterior and lateral compartments with Metzenbaums scissors to release the fascia compartments.  Medially the fascia was opened releasing the superficial posterior compartment and then the soleus was taken  down to release the deep posterior compartment.  All the muscle appeared viable.  That point in time we were able to find a monophasic PT signal in the right foot.  The mottling in the foot looked better.  We did not give protamine for reversal.  The groin was irrigated out and appeared to have good hemostasis.  This was closed in multiple layers of 2-0 Vicryl, 3-0 Vicryl, 4-0r Monocryl and Dermabond.  Fasciotomies were washed out and closed with staples.  Taken to PACU in stable condition.  Complication: None  Condition: Stable  [name], MD [facility] Office: [place]

## 2021-06-18 NOTE — ED Provider Notes (Signed)
Emergency Medicine Provider Triage Evaluation Note  Arthur Mcmahon , a 55 y.o. male  was evaluated in triage.  Pt complains of right leg pain and numbness.  Started acutely at 930, feels numb and is painful.  Denies any trauma, no history of the same..  Review of Systems  Positive: Right leg numbness and pain Negative: Facial droop, headache, nausea, vomiting, unilateral weakness  Physical Exam  BP (!) 199/124 (BP Location: Left Arm)   Pulse 95   Temp 98.4 F (36.9 C) (Oral)   Resp 16   SpO2 100%  Gen:   Awake, no distress   Resp:  Normal effort  MSK:   Decreased range of motion to the digits.  Patient is able to raise both lower extremities, although right has some weakness.  Subjective sensation abnormalities touch slightly lower on the right side.   Other:  Cranial nerves III through XII grossly intact  Medical Decision Making  Medically screening exam initiated at 3:11 PM.  Appropriate orders placed.  AMARIE TARTE was informed that the remainder of the evaluation will be completed by another provider, this initial triage assessment does not replace that evaluation, and the importance of remaining in the ED until their evaluation is complete.  Will check labs.  Elevated blood pressure, patient denies history of hypertension.  We will check labs for signs of endorgan damage electrolyte abnormalities.   Sherrill Raring, PA-C 06/18/21 1513    Davonna Belling, MD 06/18/21 1649

## 2021-06-18 NOTE — ED Triage Notes (Signed)
C/o R leg pain and numbness since 9:30am.  States pain starts in foot and radiates to R hip with lower leg numbness.

## 2021-06-18 NOTE — Progress Notes (Signed)
Notified Dr. Nyoka Cowden of lactic acid 2.3. No new orders.

## 2021-06-18 NOTE — Progress Notes (Signed)
ANTICOAGULATION CONSULT NOTE - Initial Consult  Pharmacy Consult for heparin Indication: Ischemic limb  No Known Allergies  Patient Measurements:   Heparin Dosing Weight: TBW  Vital Signs: Temp: 98.4 F (36.9 C) (10/15 1457) Temp Source: Oral (10/15 1457) BP: 185/115 (10/15 1550) Pulse Rate: 84 (10/15 1550)  Labs: Recent Labs    06/18/21 1523  HGB 16.1  HCT 47.4  PLT 193  CREATININE 1.19    Estimated Creatinine Clearance: 81.1 mL/min (by C-G formula based on SCr of 1.19 mg/dL).   Medical History: Past Medical History:  Diagnosis Date   Anemia    ETOH abuse    Hypertension    Peripheral vascular disease ([place])    Pneumonia     Assessment: Arthur Mcmahon presenting with R-leg pain and numbness, hx PAD followed by [facility], he is not on anticoagulation PTA, CBC wnl  Goal of Therapy:  Heparin level 0.3-0.7 units/ml Monitor platelets by anticoagulation protocol: Yes   Plan:  Heparin 4500 units IV x 1, and gtt at 1300 units/hr F/u 6 hour heparin level F/u VVS eval and recs  Bertis Ruddy, PharmD Clinical Pharmacist ED Pharmacist Phone # [phone] 06/18/2021 4:29 PM

## 2021-06-18 NOTE — Progress Notes (Signed)
Stop heparin per Dr. Carlis Abbott at this time.

## 2021-06-18 NOTE — Transfer of Care (Signed)
Immediate Anesthesia Transfer of Care Note  Patient: Arthur Mcmahon  Procedure(s) Performed: ENDARTERECTOMY RIGHT FEMORAL ARTERY (Right: Groin) THROMBECTOMY RIGHT LOWER EXTREMITY (Right: Groin) RIGHT LOWER EXTREMITY FASCIOTOMY (Right: Groin) PATCH ANGIOPLASTY USING XENOSURE BIOLOGIC PATCH (Right: Groin)  Patient Location: PACU  Anesthesia Type:General  Level of Consciousness: awake and alert   Airway & Oxygen Therapy: Patient Spontanous Breathing and Patient connected to nasal cannula oxygen  Post-op Assessment: Report given to RN, Post -op Vital signs reviewed and stable and Patient moving all extremities X 4  Post vital signs: Reviewed and stable  Last Vitals:  Vitals Value Taken Time  BP    Temp    Pulse    Resp    SpO2      Last Pain:  Vitals:   06/18/21 1739  TempSrc: Oral  PainSc:          Complications: No notable events documented.

## 2021-06-19 ENCOUNTER — Encounter (HOSPITAL_COMMUNITY)
Admission: EM | Disposition: A | Discharge status: Home / Self Care | Payer: Self-pay | Source: Ambulatory Visit | Attending: Vascular Surgery

## 2021-06-19 ENCOUNTER — Inpatient Hospital Stay (HOSPITAL_COMMUNITY): Payer: Medicaid Other | Admitting: Anesthesiology

## 2021-06-19 ENCOUNTER — Encounter (HOSPITAL_COMMUNITY): Payer: Self-pay | Admitting: Vascular Surgery

## 2021-06-19 HISTORY — PX: THROMBECTOMY FEMORAL ARTERY: SHX6406

## 2021-06-19 LAB — CBC
HCT: 40.2 % (ref 39.0–52.0)
Hemoglobin: 14.2 g/dL (ref 13.0–17.0)
MCH: 34 pg (ref 26.0–34.0)
MCHC: 35.3 g/dL (ref 30.0–36.0)
MCV: 96.2 fL (ref 80.0–100.0)
Platelets: 155 10*3/uL (ref 150–400)
RBC: 4.18 MIL/uL — ABNORMAL LOW (ref 4.22–5.81)
RDW: 12.7 % (ref 11.5–15.5)
WBC: 12.8 10*3/uL — ABNORMAL HIGH (ref 4.0–10.5)
nRBC: 0 % (ref 0.0–0.2)

## 2021-06-19 LAB — SURGICAL PCR SCREEN
MRSA, PCR: NEGATIVE
Staphylococcus aureus: NEGATIVE

## 2021-06-19 LAB — BASIC METABOLIC PANEL
Anion gap: 11 (ref 5–15)
BUN: 15 mg/dL (ref 6–20)
CO2: 22 mmol/L (ref 22–32)
Calcium: 6.8 mg/dL — ABNORMAL LOW (ref 8.9–10.3)
Chloride: 106 mmol/L (ref 98–111)
Creatinine, Ser: 1 mg/dL (ref 0.61–1.24)
GFR, Estimated: >60 mL/min (ref >60)
Glucose, Bld: 158 mg/dL — ABNORMAL HIGH (ref 70–99)
Potassium: 4 mmol/L (ref 3.5–5.1)
Sodium: 139 mmol/L (ref 135–145)

## 2021-06-19 LAB — LIPID PANEL
Cholesterol: 102 mg/dL (ref 0–200)
HDL: 38 mg/dL — ABNORMAL LOW (ref >40)
LDL Cholesterol: 49 mg/dL (ref 0–99)
Total CHOL/HDL Ratio: 2.7 RATIO
Triglycerides: 73 mg/dL (ref <150)
VLDL: 15 mg/dL (ref 0–40)

## 2021-06-19 LAB — URINALYSIS, ROUTINE W REFLEX MICROSCOPIC
Bilirubin Urine: NEGATIVE
Glucose, UA: NEGATIVE mg/dL
Ketones, ur: 20 mg/dL — AB
Nitrite: NEGATIVE
Protein, ur: 30 mg/dL — AB
Specific Gravity, Urine: 1.021 (ref 1.005–1.030)
WBC, UA: >50 WBC/hpf — ABNORMAL HIGH (ref 0–5)
pH: 5 (ref 5.0–8.0)

## 2021-06-19 LAB — GLUCOSE, CAPILLARY: Glucose-Capillary: 121 mg/dL — ABNORMAL HIGH (ref 70–99)

## 2021-06-19 LAB — HEPARIN LEVEL (UNFRACTIONATED)
Heparin Unfractionated: <0.1 IU/mL — ABNORMAL LOW (ref 0.30–0.70)
Heparin Unfractionated: <0.1 IU/mL — ABNORMAL LOW (ref 0.30–0.70)

## 2021-06-19 SURGERY — THROMBECTOMY, ARTERY, FEMORAL
Anesthesia: General | Site: Leg Lower | Laterality: Right

## 2021-06-19 MED ORDER — PROPOFOL 10 MG/ML IV BOLUS
INTRAVENOUS | Status: AC
  Filled 2021-06-19: qty 40

## 2021-06-19 MED ORDER — HEPARIN (PORCINE) 25000 UT/250ML-% IV SOLN
2000.0000 [IU]/h | INTRAVENOUS | Status: DC
  Administered 2021-06-19: 500 [IU]/h via INTRAVENOUS
  Administered 2021-06-20: 1450 [IU]/h via INTRAVENOUS
  Administered 2021-06-20: 1300 [IU]/h via INTRAVENOUS
  Administered 2021-06-21: 2000 [IU]/h via INTRAVENOUS
  Administered 2021-06-21: 1900 [IU]/h via INTRAVENOUS
  Filled 2021-06-19 (×5): qty 250

## 2021-06-19 MED ORDER — LIDOCAINE HCL (CARDIAC) PF 100 MG/5ML IV SOSY
PREFILLED_SYRINGE | INTRAVENOUS | Status: DC | PRN
  Administered 2021-06-19: 40 mg via INTRATRACHEAL

## 2021-06-19 MED ORDER — FENTANYL CITRATE (PF) 100 MCG/2ML IJ SOLN: 25.0000 ug | INTRAMUSCULAR | Status: DC | PRN

## 2021-06-19 MED ORDER — PROPOFOL 10 MG/ML IV BOLUS
INTRAVENOUS | Status: DC | PRN
  Administered 2021-06-19: 170 mg via INTRAVENOUS
  Administered 2021-06-19: 30 mg via INTRAVENOUS

## 2021-06-19 MED ORDER — HEPARIN SODIUM (PORCINE) 1000 UNIT/ML IJ SOLN
INTRAMUSCULAR | Status: DC | PRN
  Administered 2021-06-19: 8000 [IU] via INTRAVENOUS

## 2021-06-19 MED ORDER — ONDANSETRON HCL 4 MG/2ML IJ SOLN
INTRAMUSCULAR | Status: DC | PRN
  Administered 2021-06-19: 4 mg via INTRAVENOUS

## 2021-06-19 MED ORDER — CISATRACURIUM BESYLATE 20 MG/10ML IV SOLN
INTRAVENOUS | Status: AC
  Filled 2021-06-19: qty 10

## 2021-06-19 MED ORDER — HEPARIN (PORCINE) 25000 UT/250ML-% IV SOLN
500.0000 [IU]/h | INTRAVENOUS | Status: DC
  Filled 2021-06-19: qty 250

## 2021-06-19 MED ORDER — 0.9 % SODIUM CHLORIDE (POUR BTL) OPTIME
TOPICAL | Status: DC | PRN
  Administered 2021-06-19: 2000 mL

## 2021-06-19 MED ORDER — HEPARIN 6000 UNIT IRRIGATION SOLUTION
Status: DC | PRN
  Administered 2021-06-19: 1

## 2021-06-19 MED ORDER — FENTANYL CITRATE (PF) 250 MCG/5ML IJ SOLN
INTRAMUSCULAR | Status: AC
  Filled 2021-06-19: qty 5

## 2021-06-19 MED ORDER — LACTATED RINGERS IV SOLN: INTRAVENOUS | Status: DC | PRN

## 2021-06-19 MED ORDER — FENTANYL CITRATE (PF) 250 MCG/5ML IJ SOLN
INTRAMUSCULAR | Status: DC | PRN
  Administered 2021-06-19 (×2): 100 ug via INTRAVENOUS
  Administered 2021-06-19: 50 ug via INTRAVENOUS

## 2021-06-19 MED ORDER — SUCCINYLCHOLINE CHLORIDE 200 MG/10ML IV SOSY
PREFILLED_SYRINGE | INTRAVENOUS | Status: DC | PRN
  Administered 2021-06-19: 120 mg via INTRAVENOUS

## 2021-06-19 MED ORDER — MIDAZOLAM HCL 2 MG/2ML IJ SOLN
INTRAMUSCULAR | Status: AC
  Filled 2021-06-19: qty 2

## 2021-06-19 MED ORDER — MIDAZOLAM HCL 5 MG/5ML IJ SOLN
INTRAMUSCULAR | Status: DC | PRN
  Administered 2021-06-19: 2 mg via INTRAVENOUS

## 2021-06-19 MED ORDER — PHENYLEPHRINE HCL-NACL 20-0.9 MG/250ML-% IV SOLN
INTRAVENOUS | Status: DC | PRN
  Administered 2021-06-19: 20 ug/min via INTRAVENOUS

## 2021-06-19 MED ORDER — CEFAZOLIN SODIUM-DEXTROSE 2-3 GM-%(50ML) IV SOLR
INTRAVENOUS | Status: DC | PRN
  Administered 2021-06-19: 2 g via INTRAVENOUS

## 2021-06-19 SURGICAL SUPPLY — 61 items
BAG COUNTER SPONGE SURGICOUNT (BAG) ×2 IMPLANT
BANDAGE ESMARK 6X9 LF (GAUZE/BANDAGES/DRESSINGS) IMPLANT
BNDG ELASTIC 6X5.8 VLCR STR LF (GAUZE/BANDAGES/DRESSINGS) ×2 IMPLANT
BNDG ESMARK 6X9 LF (GAUZE/BANDAGES/DRESSINGS)
BNDG GAUZE ELAST 4 BULKY (GAUZE/BANDAGES/DRESSINGS) ×2 IMPLANT
CANISTER SUCT 3000ML PPV (MISCELLANEOUS) ×2 IMPLANT
CATH EMB 3FR 80CM (CATHETERS) ×8 IMPLANT
CATH EMB 4FR 80CM (CATHETERS) ×2 IMPLANT
CLIP VESOCCLUDE MED 24/CT (CLIP) ×2 IMPLANT
CLIP VESOCCLUDE SM WIDE 24/CT (CLIP) ×2 IMPLANT
COVER PROBE W GEL 5X96 (DRAPES) ×2 IMPLANT
CUFF TOURN SGL QUICK 24 (TOURNIQUET CUFF)
CUFF TOURN SGL QUICK 34 (TOURNIQUET CUFF)
CUFF TOURN SGL QUICK 42 (TOURNIQUET CUFF) IMPLANT
CUFF TRNQT CYL 24X4X16.5-23 (TOURNIQUET CUFF) IMPLANT
CUFF TRNQT CYL 34X4.125X (TOURNIQUET CUFF) IMPLANT
DRAIN CHANNEL 15F RND FF W/TCR (WOUND CARE) IMPLANT
DRAPE C-ARM 42X72 X-RAY (DRAPES) ×2 IMPLANT
DRAPE SHEET LG 3/4 BI-LAMINATE (DRAPES) ×2 IMPLANT
ELECT REM PT RETURN 9FT ADLT (ELECTROSURGICAL) ×2
ELECTRODE REM PT RTRN 9FT ADLT (ELECTROSURGICAL) ×1 IMPLANT
EVACUATOR SILICONE 100CC (DRAIN) IMPLANT
GAUZE SPONGE 4X4 12PLY STRL (GAUZE/BANDAGES/DRESSINGS) ×2 IMPLANT
GLOVE SRG 8 PF TXTR STRL LF DI (GLOVE) ×1 IMPLANT
GLOVE SURG ENC MOIS LTX SZ7.5 (GLOVE) ×2 IMPLANT
GLOVE SURG UNDER POLY LF SZ8 (GLOVE) ×2
GOWN STRL REUS W/ TWL LRG LVL3 (GOWN DISPOSABLE) ×2 IMPLANT
GOWN STRL REUS W/ TWL XL LVL3 (GOWN DISPOSABLE) ×2 IMPLANT
GOWN STRL REUS W/TWL LRG LVL3 (GOWN DISPOSABLE) ×4
GOWN STRL REUS W/TWL XL LVL3 (GOWN DISPOSABLE) ×4
HEMOSTAT SPONGE AVITENE ULTRA (HEMOSTASIS) IMPLANT
INSERT FOGARTY SM (MISCELLANEOUS) IMPLANT
KIT BASIN OR (CUSTOM PROCEDURE TRAY) ×2 IMPLANT
KIT TURNOVER KIT B (KITS) ×2 IMPLANT
NS IRRIG 1000ML POUR BTL (IV SOLUTION) ×4 IMPLANT
PACK PERIPHERAL VASCULAR (CUSTOM PROCEDURE TRAY) ×2 IMPLANT
PAD ARMBOARD 7.5X6 YLW CONV (MISCELLANEOUS) ×4 IMPLANT
PENCIL BUTTON HOLSTER BLD 10FT (ELECTRODE) ×2 IMPLANT
SPONGE T-LAP 18X18 ~~LOC~~+RFID (SPONGE) ×4 IMPLANT
STAPLER VISISTAT 35W (STAPLE) ×2 IMPLANT
STOPCOCK 4 WAY LG BORE MALE ST (IV SETS) IMPLANT
SUT ETHILON 3 0 PS 1 (SUTURE) IMPLANT
SUT GORETEX 5 0 TT13 24 (SUTURE) IMPLANT
SUT GORETEX 6.0 TT13 (SUTURE) IMPLANT
SUT MNCRL AB 4-0 PS2 18 (SUTURE) ×6 IMPLANT
SUT PROLENE 5 0 C 1 24 (SUTURE) ×2 IMPLANT
SUT PROLENE 6 0 BV (SUTURE) ×14 IMPLANT
SUT PROLENE 7 0 BV 1 (SUTURE) IMPLANT
SUT SILK 2 0 PERMA HAND 18 BK (SUTURE) IMPLANT
SUT SILK 3 0 (SUTURE)
SUT SILK 3-0 18XBRD TIE 12 (SUTURE) IMPLANT
SUT VIC AB 2-0 CT1 27 (SUTURE) ×4
SUT VIC AB 2-0 CT1 TAPERPNT 27 (SUTURE) ×2 IMPLANT
SUT VIC AB 3-0 SH 27 (SUTURE) ×6
SUT VIC AB 3-0 SH 27X BRD (SUTURE) ×3 IMPLANT
SYR 3ML LL SCALE MARK (SYRINGE) ×2 IMPLANT
TOWEL GREEN STERILE (TOWEL DISPOSABLE) ×2 IMPLANT
TRAY FOLEY MTR SLVR 16FR STAT (SET/KITS/TRAYS/PACK) ×2 IMPLANT
TUBING EXTENTION W/L.L. (IV SETS) IMPLANT
UNDERPAD 30X36 HEAVY ABSORB (UNDERPADS AND DIAPERS) ×2 IMPLANT
WATER STERILE IRR 1000ML POUR (IV SOLUTION) ×2 IMPLANT

## 2021-06-19 NOTE — Progress Notes (Signed)
PHARMACIST LIPID MONITORING Arthur Mcmahon is a 55 y.o. male admitted on 06/18/2021 with acute onset of pain, numbness and weakness in his right foot.  Patient is now s/p femoral endarterectomy, thrombectomy, and fasciotomy. Pharmacy has been consulted to optimize lipid-lowering therapy with the indication of secondary prevention for clinical ASCVD.  Recent Labs:  Lipid Panel (last 6 months):   Lab Results  Component Value Date   CHOL 102 06/19/2021   TRIG 73 06/19/2021   HDL 38 (L) 06/19/2021   CHOLHDL 2.7 06/19/2021   VLDL 15 06/19/2021   LDLCALC 49 06/19/2021    Hepatic function panel (last 6 months):   Lab Results  Component Value Date   AST 23 06/18/2021   ALT 15 06/18/2021   ALKPHOS 92 06/18/2021   BILITOT 1.7 (H) 06/18/2021    SCr (since admission):   Serum creatinine: 1 mg/dL 06/19/21 0546 Estimated creatinine clearance: 98.2 mL/min  Current therapy and lipid therapy tolerance Current lipid-lowering therapy: atorvastatin 40 mg daily Documented or reported allergies or intolerances to lipid-lowering therapies (if applicable): none  Assessment:   Patient's LDL 49 and at goal of LDL < 55 on current home dose of atorvastatin 40 mg daily. No changes in current lipid lowering therapy warranted at this time.   Plan:   1.Statin intensity (high intensity recommended for all patients regardless of the LDL):  No statin changes. The patient is already on a high intensity statin. Continue atorvastatin 40 mg daily.  2.Add ezetimibe (if any one of the following):   Not indicated at this time.  3.Refer to lipid clinic:   No  4.Follow-up with:  Primary care provider - Arby Barrette, Weldon Picking, DO  5.Follow-up labs after discharge:  No changes in lipid therapy, repeat a lipid panel in one year.     Thank you for involving pharmacy in this patient's care.  Elita Quick, PharmD PGY1 Ambulatory Care Pharmacy Resident 06/19/2021 8:56 AM  **Pharmacist phone directory can be found  on [email or web address] listed under [place]**

## 2021-06-19 NOTE — Significant Event (Signed)
apid Response Event Note   Reason for Call :  Unable to dopple PT pulse s/p R femoral artery endarterectomy/thrombectomy/fasciotomy   Initial Focused Assessment:  Pt lying in bed with eyes closed. He is alert and oriented, c/o 9/10 pain in RLE. His R foot is cool to touch, dusky, and mottled. Cap refill is >3 secs and PT pulse is absent. R femoral pulse is strong and incision/site WNL. Pt is unable to wiggles his toes on R foot.   Per bedside RN, RLE has became dusky/mottled and pain has increased since arrival from PACU. On arrival from PACU, RN was able to doppler R PT pulse.   T-98.1, HR-79, BP-198/106, RR-14, SpO2-97% on RA.    Interventions:  Pt going back to OR  Plan of Care:  Tx to OR    Event Summary:   MD Notified: Dr. Carlis Abbott notified and came to bedside. Call [place], Mars Scheaffer Anderson, RN

## 2021-06-19 NOTE — Progress Notes (Signed)
[facility] for IV heparin Indication:  ischemic limb  No Known Allergies  Patient Measurements: Height: 6\' 2"  (188 cm) Weight: 82.5 kg (181 lb 14.1 oz) IBW/kg (Calculated) : 82.2 Heparin Dosing Weight: TBW  Vital Signs: Temp: 97.9 F (36.6 C) (10/16 1131) Temp Source: Oral (10/16 1131) BP: 122/84 (10/16 1604) Pulse Rate: 90 (10/16 1131)  Labs: Recent Labs    06/18/21 1523 06/18/21 1550 06/19/21 0546 06/19/21 1536  HGB 16.1  --  14.2  --   HCT 47.4  --  40.2  --   PLT 193  --  155  --   APTT  --  26  --   --   LABPROT  --  13.5  --   --   INR  --  1.0  --   --   HEPARINUNFRC  --   --  <0.10* <0.10*  CREATININE 1.19  --  1.00  --      Estimated Creatinine Clearance: 98.2 mL/min (by C-G formula based on SCr of 1 mg/dL).   Medical History: Past Medical History:  Diagnosis Date   Anemia    ETOH abuse    Hypertension    Peripheral vascular disease ([place])    Pneumonia    Assessment: Patient is a 55 YO male who presented on 10/15 with chief complaint of numbness in leg. He was evaluated by VVS and subsequently underwent R femoral artery endarterectomy/thrombectomy/fasciotomy. VVS had started patient on fixed dose IV heparin at 500 units/h after surgery. Pharmacy asked to dose IV heparin without bolus.   Heparin level this evening continues to be undetectable after rate increase to 850 units/hr IV heparin. Given recent surgery, will be conservative with dose increases. CBC stable, no bleeding issues noted.   Goal of Therapy:  Heparin level 0.3-0.7 units/ml Monitor platelets by anticoagulation protocol: Yes   Plan:  Increase IV heparin gtt to 1050 units/hr Recheck heparin level in am Daily heparin level, CBC Monitor for signs/symptoms of bleeding  Thank you for involving pharmacy in this patient's care.  Erin Hearing PharmD., BCPS Clinical Pharmacist 06/19/2021 5:53 PM

## 2021-06-19 NOTE — Transfer of Care (Signed)
Immediate Anesthesia Transfer of Care Note  Patient: Arthur Mcmahon  Procedure(s) Performed: THROMBECTOMY RIGHT LOWER EXTREMITY VIA POPITEAL APPROACH (Right: Leg Lower)  Patient Location: PACU  Anesthesia Type:General  Level of Consciousness: sedated  Airway & Oxygen Therapy: Patient Spontanous Breathing and Patient connected to face mask oxygen  Post-op Assessment: Report given to RN and Post -op Vital signs reviewed and stable  Post vital signs: Reviewed and stable  Last Vitals:  Vitals Value Taken Time  BP 144/91 06/19/21 0231  Temp    Pulse 75 06/19/21 0232  Resp 19 06/19/21 0232  SpO2 100 % 06/19/21 0232  Vitals shown include unvalidated device data.  Last Pain:  Vitals:   06/18/21 2338  TempSrc:   PainSc: 9       Patients Stated Pain Goal: 0 (48/34/75 8307)  Complications: No notable events documented.

## 2021-06-19 NOTE — Anesthesia Preprocedure Evaluation (Signed)
Anesthesia Evaluation  Patient identified by MRN, date of birth, ID band Patient awake    Reviewed: Allergy & Precautions, NPO status , Patient's Chart, lab work & pertinent test results  Airway Mallampati: II  TM Distance: >3 FB     Dental   Pulmonary pneumonia, Current Smoker and Patient abstained from smoking.,    breath sounds clear to auscultation       Cardiovascular hypertension, + Peripheral Vascular Disease   Rhythm:Regular Rate:Normal     Neuro/Psych    GI/Hepatic negative GI ROS, Neg liver ROS,   Endo/Other  negative endocrine ROS  Renal/GU negative Renal ROS     Musculoskeletal   Abdominal   Peds  Hematology   Anesthesia Other Findings   Reproductive/Obstetrics                             Anesthesia Physical Anesthesia Plan  ASA: 3  Anesthesia Plan: General   Post-op Pain Management:    Induction: Intravenous  PONV Risk Score and Plan: Ondansetron, Dexamethasone and Midazolam  Airway Management Planned: Oral ETT  Additional Equipment:   Intra-op Plan:   Post-operative Plan: Possible Post-op intubation/ventilation  Informed Consent: I have reviewed the patients History and Physical, chart, labs and discussed the procedure including the risks, benefits and alternatives for the proposed anesthesia with the patient or authorized representative who has indicated his/her understanding and acceptance.     Dental advisory given  Plan Discussed with: CRNA and Anesthesiologist  Anesthesia Plan Comments:         Anesthesia Quick Evaluation

## 2021-06-19 NOTE — Progress Notes (Signed)
55 year old male that underwent right common femoral endarterectomy with right lower extremity thrombectomy for acute on chronic ischemia tonight.  He had a warm foot with a PT signal in PACU.  Signal lost on the floor now with a mottled foot.  Will return to the operating room for right lower extremity thrombectomy possible bypass.  [name], MD [facility] Office: [place]

## 2021-06-19 NOTE — Progress Notes (Signed)
Report called to [place], Therapist, sports.  Transported in bed with monitor to 4E18.  Belongings at bedside with patient.

## 2021-06-19 NOTE — Progress Notes (Signed)
[facility]  Subjective  -states his right foot feels a lot better.  Soreness in the calf at fasciotomy sites.   Objective (!) 141/92 87 97.9 F (36.6 C) (Oral) 20 96%  Intake/Output Summary (Last 24 hours) at 06/19/2021 0858 Last data filed at 06/19/2021 0600 Gross per 24 hour  Intake 1606.75 ml  Output 970 ml  Net 636.75 ml    Right femoral pulse palpable and incision is clean dry and intact Right fasciotomy incisions closed with staples and no signs of hematoma Brisk PT signal in the right foot and the foot is warm and able to wiggle toes AT sounds occlusive at the the ankle  Laboratory Lab Results: Recent Labs    06/18/21 1523 06/19/21 0546  WBC 13.8* 12.8*  HGB 16.1 14.2  HCT 47.4 40.2  PLT 193 155   BMET Recent Labs    06/18/21 1523 06/19/21 0546  NA 129* 139  K 4.7 4.0  CL 93* 106  CO2 22 22  GLUCOSE 124* 158*  BUN 6 15  CREATININE 1.19 1.00  CALCIUM 9.0 6.8*    COAG Lab Results  Component Value Date   INR 1.0 06/18/2021   INR 1.1 04/11/2021   INR 0.9 03/22/2021   No results found for: PTT  Assessment/Planning:  55 year old male presented with acute on chronic ischemia of the right leg to the ED yesterday evening.  He had evidence of occluded common femoral and occluded tibials.  Underwent common femoral endarterectomy and right lower extremity thrombectomy with fasciotomy.  Rethrombosed his leg on the floor and had to go back for popliteal cutdown with thrombectomy of the tibials last night.  Excellent PT signal this morning and the foot is warm.  Changed his dressing and the fasciotomy incisions are clean and dry with no hematoma.  Have discussed with pharmacy starting heparin protocol with no bolus.  He has been on heparin at 500 units an hour overnight with no signs of bleeding.  Aspirin Plavix also ordered.  Pleased with his progress this morning.  [name] 06/19/2021 8:58 AM --

## 2021-06-19 NOTE — Progress Notes (Signed)
Patient reported pain/pressure in calf area 5/10 @ 2305.  Morphine given per orders at 2308.  Pt continued to c/o pain after medication given.  Attempted to locate PT pulse with doppler and unable to obtain pulse.  Femoral pulse present and femoral site unchanged.  While attempting to locate peripheral pulse with doppler, noted change in color and mottling on foot.  Notified RRT who came to bedside. Notified surgeon of increase in pain and absent pulse and mottling of RLE.  Pt also hypertensive, hydralazine 5 mg administered per orders.  RRT and MD at bedside.  Patient consented for surgery and taken to short stay with RRT.  Report given to CRNA.

## 2021-06-19 NOTE — Op Note (Signed)
Date: June 19, 2021  Preoperative diagnosis: Acute on chronic ischemia right lower extremity  Postoperative diagnosis: Same  Procedure: Right lower extremity thrombectomy via popliteal approach  Surgeon: Dr. [name], MD  Assistant: Leontine Locket, PA  Indications: 55 year old male who presented to the ED this evening with acute on chronic ischemia with evidence of occluded right common femoral artery and no distal runoff.  He underwent right common femoral endarterectomy with right lower extremity thrombectomy and fasciotomies.  We had a PT signal at completion and he lost his signal on the floor tonight.  He had a mottled profoundly painful foot consistent and he presents emergently after risk benefits discussed.  Findings: Cutdown on the below-knee popliteal artery where there was no pulse.  Ultimately the AT PT peroneal were all dissected out controlled with Vesseloops.  I opened the below-knee popliteal artery and there was acute thrombus and after thrombectomy proximal we had excellent pulsatile inflow and retrieved large plugs of acute thrombus.  I then passed #3 Fogarty's down the anterior tibial into the foot and did not retrieve any thrombus.  I was able to pass #3 Fogarty into the PT into the foot and did retrieve several additional plugs of thrombus.  The peroneal was diseased and the Fogarty would not go down the peroneal.  The arteriotomy was closed with interrupted 6-0 Prolene's.  There was a brisk PT signal at completion.  The peroneal and AT signal sounded occlusive at the ankle.  Anesthesia: General  Details: Patient was taken to the operating room after informed consent was obtained.  Placed on the operative table in supine position.  General endotracheal anesthesia was induced.  The right leg and right groin were prepped and draped in usual sterile fashion.  Timeout was performed.  Ultimately the staples on the medial calf were removed and I entered the popliteal  space and used Meyerding retractors.  The below-knee popliteal artery was dissected out controlled with a vessel loop and there was no pulse here.  I then dissected down ligated the anterior tibial vein between 3-0 silk ties and identified the anterior tibial artery that was controlled with a vessel loop.  I then took down soleus and identified the TP trunk and dissected out the PT and peroneal and controlled these with Vesseloops.  The TP trunk was heavily calcified.  Patient was given 100 units/kg IV heparin.  I then made transverse arteriotomy in the below-knee popliteal artery just above the AT where the artery was soft.  There was thrombus here and no inflow.  I was able to pass a #4 Fogarty proximal with no resistance and got a large plug of thrombus and then had excellent pulsatile inflow that was controlled with Vesseloops.  I then passed a #3 Fogarty down the anterior tibial with no resistance into the foot and did not retrieve any additional thrombus.  I then directed #3 Fogarty down the TP trunk and this would not go down the peroneal given it was heavily diseased and calcified.  I was able to pass it down the PT into the foot.  I retrieved several additional plugs of thrombus on multiple passes.  I then ended injected heparinized saline down all the tibial vessels.  The arteriotomy in the below-knee popliteal artery was closed with multiple interrupted 6-0 Prolene's.  We de-aired everything prior to completion.  There was a palpable pulse in the below-knee popliteal artery.  We checked the foot and there was occlusive signal in the AT and  peroneal suggestive of no outflow here.  The PT had an excellent signal.  We did not reverse and placed the patient on 500 units of heparin per hour.  The incision was irrigated out.  Hemostasis was achieved.  Incision was closed with staples and then the wound was wrapped with Ace and Kerlix.  Complications: None  Condition: Stable  [name],  MD [facility] Office: [place]

## 2021-06-19 NOTE — Anesthesia Procedure Notes (Signed)
Procedure Name: Intubation Date/Time: 06/19/2021 1:05 AM Performed by: Clovis Cao, CRNA Pre-anesthesia Checklist: Patient identified, Emergency Drugs available, Suction available and Patient being monitored Patient Re-evaluated:Patient Re-evaluated prior to induction Oxygen Delivery Method: Circle system utilized Preoxygenation: Pre-oxygenation with 100% oxygen Induction Type: IV induction, Rapid sequence and Cricoid Pressure applied Laryngoscope Size: Miller and 2 Grade View: Grade I Tube type: Oral Tube size: 7.5 mm Number of attempts: 1 Airway Equipment and Method: Stylet Placement Confirmation: ETT inserted through vocal cords under direct vision, positive ETCO2 and breath sounds checked- equal and bilateral Secured at: 23 cm Tube secured with: Tape Dental Injury: Teeth and Oropharynx as per pre-operative assessment

## 2021-06-19 NOTE — Progress Notes (Signed)
PT Cancellation Note  Patient Details Name: Arthur Mcmahon MRN: [id number] DOB: [birth year]   Cancelled Treatment:    Reason Eval/Treat Not Completed: Pain limiting ability to participate;Patient declined, no reason specified. Pt declines PT, reporting pain and not wanting to move currently despite PT encouragement. PT will follow up as time allows.   Zenaida Niece 06/19/2021, 3:54 PM

## 2021-06-19 NOTE — Progress Notes (Signed)
ANTICOAGULATION CONSULT NOTE - Initial Consult  Pharmacy Consult for IV heparin Indication:  ischemic limb  No Known Allergies  Patient Measurements: Height: 6\' 2"  (188 cm) Weight: 82.5 kg (181 lb 14.1 oz) IBW/kg (Calculated) : 82.2 Heparin Dosing Weight: TBW  Vital Signs: Temp: 97.9 F (36.6 C) (10/16 0727) Temp Source: Oral (10/16 0727) BP: 141/92 (10/16 0727) Pulse Rate: 87 (10/16 0727)  Labs: Recent Labs    06/18/21 1523 06/18/21 1550 06/19/21 0546  HGB 16.1  --  14.2  HCT 47.4  --  40.2  PLT 193  --  155  APTT  --  26  --   LABPROT  --  13.5  --   INR  --  1.0  --   HEPARINUNFRC  --   --  <0.10*  CREATININE 1.19  --  1.00    Estimated Creatinine Clearance: 98.2 mL/min (by C-G formula based on SCr of 1 mg/dL).   Medical History: Past Medical History:  Diagnosis Date   Anemia    ETOH abuse    Hypertension    Peripheral vascular disease ([place])    Pneumonia    Assessment: Patient is a 55 YO male who presented on 10/15 with chief complaint of numbness in leg. He was evaluated by [facility] and subsequently underwent R femoral artery endarterectomy/thrombectomy/fasciotomy. [facility] had started patient on fixed dose IV heparin at 500 units/h after surgery. Pharmacy asked to dose IV heparin without bolus.   Heparin level this morning undetectable on 500 units/hr IV heparin. Given recent surgery, will be more conservative with dose increase. CBC is stable today. No signs of bleeding or IV infusion site problems per RN.  Goal of Therapy:  Heparin level 0.3-0.7 units/ml Monitor platelets by anticoagulation protocol: Yes   Plan:  Increase IV heparin gtt to 850 units/h 6 hour heparin level Daily heparin level, CBC Monitor for signs/symptoms of bleeding  Thank you for involving pharmacy in this patient's care.  Elita Quick, PharmD PGY1 Ambulatory Care Pharmacy Resident 06/19/2021 9:24 AM  **Pharmacist phone directory can be found on [email or web address] listed under [place]**

## 2021-06-20 ENCOUNTER — Encounter (HOSPITAL_COMMUNITY): Payer: Self-pay | Admitting: Vascular Surgery

## 2021-06-20 DIAGNOSIS — Z9889 Other specified postprocedural states: Secondary | ICD-10-CM

## 2021-06-20 LAB — HEPARIN LEVEL (UNFRACTIONATED)
Heparin Unfractionated: <0.1 IU/mL — ABNORMAL LOW (ref 0.30–0.70)
Heparin Unfractionated: <0.1 IU/mL — ABNORMAL LOW (ref 0.30–0.70)
Heparin Unfractionated: <0.1 IU/mL — ABNORMAL LOW (ref 0.30–0.70)

## 2021-06-20 LAB — CBC
HCT: 37.1 % — ABNORMAL LOW (ref 39.0–52.0)
Hemoglobin: 12.1 g/dL — ABNORMAL LOW (ref 13.0–17.0)
MCH: 32.6 pg (ref 26.0–34.0)
MCHC: 32.6 g/dL (ref 30.0–36.0)
MCV: 100 fL (ref 80.0–100.0)
Platelets: 151 10*3/uL (ref 150–400)
RBC: 3.71 MIL/uL — ABNORMAL LOW (ref 4.22–5.81)
RDW: 12.7 % (ref 11.5–15.5)
WBC: 8.6 10*3/uL (ref 4.0–10.5)
nRBC: 0 % (ref 0.0–0.2)

## 2021-06-20 LAB — ANTITHROMBIN III: AntiThromb III Func: 74 % — ABNORMAL LOW (ref 75–120)

## 2021-06-20 NOTE — Evaluation (Signed)
Occupational Therapy Evaluation Patient Details Name: Arthur Mcmahon MRN: [id number] DOB: 12-02-1965 Today's Date: 06/20/2021   History of Present Illness 55 y.o male admitted 06/18/21 R LE ischemia. Underwent R common femoral endarterectomy, thrombectomy, fasciotomy. Returned to OR for second RLE thrombectomy. PMHx: L LE vascular surgery 04/2021, HTN, PAD, and tobacco use disorder.   Clinical Impression   Pt was independent prior to admission. He presents with R LE pain, decreased tolerance of weight through R LE and impaired balance. Pt requires up to min assist for mobility and ADL. He is likely to progress well to return home with support of his roommates.     Recommendations for follow up therapy are one component of a multi-disciplinary discharge planning process, led by the attending physician.  Recommendations may be updated based on patient status, additional functional criteria and insurance authorization.   Follow Up Recommendations  No OT follow up    Equipment Recommendations  3 in 1 bedside commode (RW)    Recommendations for Other Services       Precautions / Restrictions Precautions Precautions: Fall Restrictions Weight Bearing Restrictions: Yes RLE Weight Bearing: Weight bearing as tolerated      Mobility Bed Mobility Overal bed mobility: Needs Assistance Bed Mobility: Supine to Sit     Supine to sit: Min assist     General bed mobility comments: min assist for LLE, HOB up    Transfers   Equipment used: Rolling walker (2 wheeled) Transfers: Sit to/from Stand Sit to Stand: Min assist         General transfer comment: cues for hand placement, min assist to rise and steady    Balance Overall balance assessment: Needs assistance   Sitting balance-Leahy Scale: Good     Standing balance support: Bilateral upper extremity supported Standing balance-Leahy Scale: Poor                             ADL either performed or assessed with  clinical judgement   ADL Overall ADL's : Needs assistance/impaired Eating/Feeding: Independent;Bed level   Grooming: Set up;Sitting;Wash/dry hands   Upper Body Bathing: Set up;Sitting   Lower Body Bathing: Minimal assistance;Sit to/from stand   Upper Body Dressing : Set up;Sitting   Lower Body Dressing: Minimal assistance;Sit to/from stand   Toilet Transfer: RW;Ambulation;Min guard           Functional mobility during ADLs: Min guard;Rolling walker       Vision Patient Visual Report: No change from baseline       Perception     Praxis      Pertinent Vitals/Pain Pain Assessment: 0-10 Pain Score: 6  Pain Location: R LE Pain Descriptors / Indicators: Aching Pain Intervention(s): Monitored during session;Repositioned     Hand Dominance Right   Extremity/Trunk Assessment Upper Extremity Assessment Upper Extremity Assessment: Overall WFL for tasks assessed   Lower Extremity Assessment Lower Extremity Assessment: Defer to PT evaluation   Cervical / Trunk Assessment Cervical / Trunk Assessment: Normal   Communication Communication Communication: No difficulties   Cognition Arousal/Alertness: Awake/alert Behavior During Therapy: WFL for tasks assessed/performed Overall Cognitive Status: Within Functional Limits for tasks assessed                                     General Comments       Exercises     Shoulder Instructions  Home Living Family/patient expects to be discharged to:: Private residence Living Arrangements: Non-relatives/Friends Available Help at Discharge: Friend(s);Available PRN/intermittently Type of Home: House Home Access: Stairs to enter [employer] of Steps: 1   Home Layout: One level     Bathroom Shower/Tub: Tub/shower unit;Curtain   Bathroom Toilet: Handicapped height     Home Equipment: Grab bars - tub/shower   Additional Comments: Pt reports 1-2 roommates one is home all the time.       Prior Functioning/Environment Level of Independence: Independent                 OT Problem List: Impaired balance (sitting and/or standing);Decreased knowledge of use of DME or AE;Pain      OT Treatment/Interventions: Self-care/ADL training;Patient/family education;Therapeutic activities;Balance training;DME and/or AE instruction    OT Goals(Current goals can be found in the care plan section) Acute Rehab OT Goals Patient Stated Goal: Return home OT Goal Formulation: With patient Time For Goal Achievement: 07/04/21 Potential to Achieve Goals: Good  OT Frequency: Min 2X/week   Barriers to D/C:            Co-evaluation              AM-PAC OT "6 Clicks" Daily Activity     Outcome Measure Help from another person eating meals?: None Help from another person taking care of personal grooming?: A Little Help from another person toileting, which includes using toliet, bedpan, or urinal?: A Little Help from another person bathing (including washing, rinsing, drying)?: A Little Help from another person to put on and taking off regular upper body clothing?: None Help from another person to put on and taking off regular lower body clothing?: A Little 6 Click Score: 20   End of Session Equipment Utilized During Treatment: Rolling walker  Activity Tolerance: Patient tolerated treatment well Patient left: in chair;with call bell/phone within reach;with chair alarm set  OT Visit Diagnosis: Unsteadiness on feet (R26.81);Other abnormalities of gait and mobility (R26.89);Pain                Time: 1448-1856 OT Time Calculation (min): 25 min Charges:  OT General Charges $OT Visit: 1 Visit OT Evaluation $OT Eval Moderate Complexity: 1 Mod OT Treatments $Self Care/Home Management : 8-22 mins  Nestor Lewandowsky, OTR/L Acute Rehabilitation Services Pager: [phone] Office: [phone]   Malka So 06/20/2021, 9:38 AM

## 2021-06-20 NOTE — Progress Notes (Signed)
ANTICOAGULATION CONSULT NOTE - Follow Up Consult  Pharmacy Consult for Heparin Indication:  ischemic limb  No Known Allergies  Patient Measurements: Height: 6\' 2"  (188 cm) Weight: 82.5 kg (181 lb 14.1 oz) IBW/kg (Calculated) : 82.2 Heparin Dosing Weight: TBW (BMI 23)  Vital Signs: Temp: 98 F (36.7 C) (10/17 1933) Temp Source: Oral (10/17 1933) BP: 153/81 (10/17 1933) Pulse Rate: 66 (10/17 1933)  Labs: Recent Labs    06/18/21 1523 06/18/21 1550 06/19/21 0546 06/19/21 1536 06/20/21 0108 06/20/21 1009 06/20/21 2011  HGB 16.1  --  14.2  --  12.1*  --   --   HCT 47.4  --  40.2  --  37.1*  --   --   PLT 193  --  155  --  151  --   --   APTT  --  26  --   --   --   --   --   LABPROT  --  13.5  --   --   --   --   --   INR  --  1.0  --   --   --   --   --   HEPARINUNFRC  --   --  <0.10*   < > <0.10* <0.10* <0.10*  CREATININE 1.19  --  1.00  --   --   --   --    < > = values in this interval not displayed.     Estimated Creatinine Clearance: 98.2 mL/min (by C-G formula based on SCr of 1 mg/dL).  Lab Results  Component Value Date/Time   AntiThromb III Func 74 (L) 06/20/2021 1009     Medications:  Scheduled:   aspirin EC  81 mg Oral Daily   atorvastatin  40 mg Oral Daily   clopidogrel  75 mg Oral Q0600   docusate sodium  100 mg Oral Daily   pantoprazole  40 mg Oral Daily   Infusions:   sodium chloride     sodium chloride 75 mL/hr at 06/19/21 1807   heparin 1,450 Units/hr (06/20/21 2026)   magnesium sulfate bolus IVPB      Assessment: 55 YO male who presented on 10/15 with chief complaint of numbness in leg. He was evaluated by [facility] and subsequently underwent R femoral artery endarterectomy/thrombectomy/fasciotomy. [facility] had started patient on fixed-dose IV heparin at 500 units/h after surgery. Pharmacy asked to dose IV heparin without bolus.   Heparin level remains undetectable after rate increases. CBC stable.  Discussed with RN. No line issues or known  disruptions to heparin infusion. AT3 returned at lower end of normal (74, range 75-120). RN does report bloody urine since foley pull, continuing tonight. She states MD aware. Will be more conservative with rate increase with new hematuria.  Goal of Therapy:  Heparin level 0.3-0.7 units/ml Monitor platelets by anticoagulation protocol: Yes   Plan:  - Increase heparin infusion to 1650 units/hr - Check heparin level in 6 hours - Monitor daily CBC, s/sx bleeding   Arturo Morton, PharmD, BCPS Please check [facility]ION for all [place] contact numbers Clinical Pharmacist 06/20/2021 9:24 PM

## 2021-06-20 NOTE — Anesthesia Postprocedure Evaluation (Signed)
Anesthesia Post Note  Patient: Arthur Mcmahon  Procedure(s) Performed: THROMBECTOMY RIGHT LOWER EXTREMITY VIA POPITEAL APPROACH (Right: Leg Lower)     Patient location during evaluation: PACU Anesthesia Type: General Level of consciousness: awake Pain management: pain level controlled Vital Signs Assessment: post-procedure vital signs reviewed and stable Respiratory status: spontaneous breathing Cardiovascular status: stable Postop Assessment: no apparent nausea or vomiting Anesthetic complications: no   No notable events documented.  Last Vitals:  Vitals:   06/20/21 0747 06/20/21 1202  BP: 124/78 (!) 141/84  Pulse: 63 63  Resp: 17 19  Temp: 36.8 C 36.7 C  SpO2: 96% 99%    Last Pain:  Vitals:   06/20/21 1207  TempSrc:   PainSc: 5                  Cheyan Frees

## 2021-06-20 NOTE — Progress Notes (Signed)
Pt had cranberry colored urine x2 with small clots. Will continue to monitor   Phoebe Sharps, RN

## 2021-06-20 NOTE — Progress Notes (Signed)
ANTICOAGULATION CONSULT NOTE - Follow Up Consult  Pharmacy Consult for Heparin Indication:  ischemic limb  No Known Allergies  Patient Measurements: Height: 6\' 2"  (188 cm) Weight: 82.5 kg (181 lb 14.1 oz) IBW/kg (Calculated) : 82.2 Heparin Dosing Weight: TBW (BMI 23)  Vital Signs: Temp: 98 F (36.7 C) (10/17 1202) Temp Source: Oral (10/17 1202) BP: 141/84 (10/17 1202) Pulse Rate: 63 (10/17 1202)  Labs: Recent Labs    06/18/21 1523 06/18/21 1523 06/18/21 1550 06/19/21 0546 06/19/21 1536 06/20/21 0108 06/20/21 1009  HGB 16.1  --   --  14.2  --  12.1*  --   HCT 47.4  --   --  40.2  --  37.1*  --   PLT 193  --   --  155  --  151  --   APTT  --   --  26  --   --   --   --   LABPROT  --   --  13.5  --   --   --   --   INR  --   --  1.0  --   --   --   --   HEPARINUNFRC  --    < >  --  <0.10* <0.10* <0.10* <0.10*  CREATININE 1.19  --   --  1.00  --   --   --    < > = values in this interval not displayed.    Estimated Creatinine Clearance: 98.2 mL/min (by C-G formula based on SCr of 1 mg/dL).  Lab Results  Component Value Date/Time   AntiThromb III Func 74 (L) 06/20/2021 1009     Medications:  Scheduled:   aspirin EC  81 mg Oral Daily   atorvastatin  40 mg Oral Daily   clopidogrel  75 mg Oral Q0600   docusate sodium  100 mg Oral Daily   pantoprazole  40 mg Oral Daily   Infusions:   sodium chloride     sodium chloride 75 mL/hr at 06/19/21 1807   heparin 1,450 Units/hr (06/20/21 1422)   magnesium sulfate bolus IVPB      Assessment: 55 YO male who presented on 10/15 with chief complaint of numbness in leg. He was evaluated by [facility] and subsequently underwent R femoral artery endarterectomy/thrombectomy/fasciotomy. [facility] had started patient on fixed dose IV heparin at 500 units/h after surgery. Pharmacy asked to dose IV heparin without bolus.   Heparin level remains undetectable after 3rd rate increase to 1300 units/hr IV heparin. Given recent surgery, will  continue to be conservative with dose increases and forego heparin boluses. CBC stable.  Discussed with RN. No line issues, no s/sx bleeding, no known disruptions to heparin infusion. Will add on AT3 and if needed, will discuss alternative anticoagulation with provider based on results.   Goal of Therapy:  Heparin level 0.3-0.7 units/ml Monitor platelets by anticoagulation protocol: Yes   Plan:  - Follow-up AT3: returned at lower end of normal (74, range 75-120) - Increase heparin infusion to 1450 units/hr - Check heparin level in 6 hours and daily while on heparin - Continue to monitor H&H and platelets   Thank you for allowing pharmacy to be a part of this patient's care.  Ardyth Harps, PharmD Clinical Pharmacist

## 2021-06-20 NOTE — Evaluation (Signed)
Physical Therapy Evaluation Patient Details Name: Arthur Mcmahon MRN: [id number] DOB: 1966/04/18 Today's Date: 06/20/2021  History of Present Illness  55 y.o male admitted 06/18/21 R LE ischemia. Underwent R common femoral endarterectomy, thrombectomy, fasciotomy 10/15. Returned to OR for second RLE thrombectomy 10/16. PMHx: L LE vascular surgery 04/2021, HTN, PAD, and tobacco use disorder.  Clinical Impression  Pt admitted with above diagnosis. At baseline pt is independent and lives with roommates.  Today, pt mostly limited by pain but was able to take a few steps in the room with RW and required min A for transfers.  Pt expected to progress well as pain improves. Encouraged to ambulate with nursing in addition to PT. Pt currently with functional limitations due to the deficits listed below (see PT Problem List). Pt will benefit from skilled PT to increase their independence and safety with mobility to allow discharge to the venue listed below.          Recommendations for follow up therapy are one component of a multi-disciplinary discharge planning process, led by the attending physician.  Recommendations may be updated based on patient status, additional functional criteria and insurance authorization.  Follow Up Recommendations Supervision - Intermittent;No PT follow up    Equipment Recommendations  Rolling walker with 5" wheels    Recommendations for Other Services       Precautions / Restrictions Precautions Precautions: Fall Restrictions Weight Bearing Restrictions: Yes RLE Weight Bearing: Weight bearing as tolerated      Mobility  Bed Mobility Overal bed mobility: Needs Assistance Bed Mobility: Sit to Supine     Supine to sit: Min assist     General bed mobility comments: min assist for LLE,    Transfers Overall transfer level: Needs assistance Equipment used: Rolling walker (2 wheeled) Transfers: Sit to/from Stand Sit to Stand: Min assist         General  transfer comment: cues for hand placement, min assist to rise and steady; increased time due to pain  Ambulation/Gait Ambulation/Gait assistance: Min guard Gait Distance (Feet): 10 Feet Assistive device: Rolling walker (2 wheeled) Gait Pattern/deviations: Step-to pattern;Decreased stride length;Decreased weight shift to right;Antalgic Gait velocity: decreased   General Gait Details: Very little to no weight on R LE due to pain; encouraged to take steps with R leg and try to get foot flat even if not putting weight on it; educated on RW use to compensate for pain  Stairs            Wheelchair Mobility    Modified Rankin (Stroke Patients Only)       Balance Overall balance assessment: Needs assistance Sitting-balance support: No upper extremity supported Sitting balance-Leahy Scale: Good     Standing balance support: Bilateral upper extremity supported Standing balance-Leahy Scale: Poor Standing balance comment: requiring RW but stable with walker                             Pertinent Vitals/Pain Pain Assessment: 0-10 Pain Score: 8  Pain Location: R LE Pain Descriptors / Indicators: Discomfort Pain Intervention(s): Limited activity within patient's tolerance;Monitored during session;Patient requesting pain meds-RN notified    Home Living Family/patient expects to be discharged to:: Private residence Living Arrangements: Non-relatives/Friends Available Help at Discharge: Friend(s);Available PRN/intermittently Type of Home: House Home Access: Stairs to enter Entrance Stairs-Rails: Psychiatric nurse of Steps: 1 Home Layout: One level Home Equipment: Grab bars - tub/shower;Shower seat Additional Comments: Pt reports 1-2  roommates one is home all the time.    Prior Function Level of Independence: Independent               Hand Dominance   Dominant Hand: Right    Extremity/Trunk Assessment   Upper Extremity Assessment Upper  Extremity Assessment: Overall WFL for tasks assessed    Lower Extremity Assessment Lower Extremity Assessment: LLE deficits/detail;RLE deficits/detail RLE Deficits / Details: Post op changes; ROM: ROM is functional but does have decreased DF (to neutral), knee flexion (~60 degrees), and knee ext (lacks 10) compared to L due to pain; MMT 3/5 not further tested due to pain; + edema, mild bleeding from surgical sites in dependent position LLE Deficits / Details: ROM WFL; MMT 5/5    Cervical / Trunk Assessment Cervical / Trunk Assessment: Normal  Communication   Communication: No difficulties  Cognition Arousal/Alertness: Awake/alert Behavior During Therapy: WFL for tasks assessed/performed Overall Cognitive Status: Within Functional Limits for tasks assessed                                        General Comments General comments (skin integrity, edema, etc.): Discussed how to do stairs at home - "up with good , down with bad" , use of rails or backward with RW    Exercises     Assessment/Plan    PT Assessment Patient needs continued PT services  PT Problem List Decreased strength;Decreased mobility;Decreased range of motion;Decreased activity tolerance;Pain;Decreased balance;Decreased knowledge of use of DME       PT Treatment Interventions DME instruction;Therapeutic activities;Modalities;Gait training;Therapeutic exercise;Patient/family education;Stair training;Balance training;Functional mobility training    PT Goals (Current goals can be found in the Care Plan section)  Acute Rehab PT Goals Patient Stated Goal: Return home PT Goal Formulation: With patient Time For Goal Achievement: 07/04/21 Potential to Achieve Goals: Good    Frequency Min 3X/week   Barriers to discharge        Co-evaluation               AM-PAC PT "6 Clicks" Mobility  Outcome Measure Help needed turning from your back to your side while in a flat bed without using  bedrails?: A Little Help needed moving from lying on your back to sitting on the side of a flat bed without using bedrails?: A Little Help needed moving to and from a bed to a chair (including a wheelchair)?: A Little Help needed standing up from a chair using your arms (e.g., wheelchair or bedside chair)?: A Little Help needed to walk in hospital room?: A Little Help needed climbing 3-5 steps with a railing? : A Lot 6 Click Score: 17    End of Session   Activity Tolerance: Patient limited by pain Patient left: in bed;with call bell/phone within reach;with bed alarm set Nurse Communication: Mobility status;Patient requests pain meds PT Visit Diagnosis: Other abnormalities of gait and mobility (R26.89);Muscle weakness (generalized) (M62.81)    Time: 7939-0300 PT Time Calculation (min) (ACUTE ONLY): 16 min   Charges:   PT Evaluation $PT Eval Low Complexity: 1 Low          Arthur Mcmahon, PT Acute Rehab Services Pager [phone] [facility] [phone]   Arthur Mcmahon 06/20/2021, 11:31 AM

## 2021-06-20 NOTE — Progress Notes (Signed)
[place] for IV heparin Indication:  ischemic limb  No Known Allergies  Patient Measurements: Height: 6\' 2"  (188 cm) Weight: 82.5 kg (181 lb 14.1 oz) IBW/kg (Calculated) : 82.2 Heparin Dosing Weight: TBW  Vital Signs: Temp: 98.3 F (36.8 C) (10/16 2322) Temp Source: Oral (10/16 2322) BP: 139/82 (10/16 2322) Pulse Rate: 79 (10/16 2322)  Labs: Recent Labs    06/18/21 1523 06/18/21 1550 06/19/21 0546 06/19/21 1536 06/20/21 0108  HGB 16.1  --  14.2  --  12.1*  HCT 47.4  --  40.2  --  37.1*  PLT 193  --  155  --  151  APTT  --  26  --   --   --   LABPROT  --  13.5  --   --   --   INR  --  1.0  --   --   --   HEPARINUNFRC  --   --  <0.10* <0.10* <0.10*  CREATININE 1.19  --  1.00  --   --      Estimated Creatinine Clearance: 98.2 mL/min (by C-G formula based on SCr of 1 mg/dL).   Medical History: Past Medical History:  Diagnosis Date   Anemia    ETOH abuse    Hypertension    Peripheral vascular disease ([place])    Pneumonia    Assessment: Patient is a 55 YO male who presented on 10/15 with chief complaint of numbness in leg. He was evaluated by [facility] and subsequently underwent R femoral artery endarterectomy/thrombectomy/fasciotomy. [facility] had started patient on fixed dose IV heparin at 500 units/h after surgery. Pharmacy asked to dose IV heparin without bolus.   Heparin level continues to be undetectable after rate increase to 1050 units/hr IV heparin. Given recent surgery, will be conservative with dose increases. CBC stable, no bleeding issues noted.   Goal of Therapy:  Heparin level 0.3-0.7 units/ml Monitor platelets by anticoagulation protocol: Yes   Plan:  Increase IV heparin gtt to 1300 units/hr 6 hr heparin level  Thank you for involving pharmacy in this patient's care.  Sherlon Handing, PharmD, BCPS Please see amion for complete clinical pharmacist phone list 06/20/2021 3:00 AM

## 2021-06-20 NOTE — Progress Notes (Addendum)
Progress Note    06/20/2021 7:17 AM 1 Day Post-Op  Subjective:  says his foot feels much much better.  Very appreciative this morning.   Afebrile HR 60's-80's NSR 902'I-097'D systolic 53% RA  Vitals:   06/19/21 2322 06/20/21 0436  BP: 139/82 135/81  Pulse: 79 71  Resp: 18 18  Temp: 98.3 F (36.8 C) 98 F (36.7 C)  SpO2: 96% 94%    Physical Exam: Cardiac:  regular Lungs:  non labored Incisions:  right groin and medial and lateral fasciotomy sites healing nicely.  Extremities:  brisk right PT and DP with PT > DP;  able to wiggle toes and sensation in tact on dorsum of foot.    CBC    Component Value Date/Time   WBC 8.6 06/20/2021 0108   RBC 3.71 (L) 06/20/2021 0108   HGB 12.1 (L) 06/20/2021 0108   HCT 37.1 (L) 06/20/2021 0108   PLT 151 06/20/2021 0108   MCV 100.0 06/20/2021 0108   MCH 32.6 06/20/2021 0108   MCHC 32.6 06/20/2021 0108   RDW 12.7 06/20/2021 0108   LYMPHSABS 0.8 06/18/2021 1523   MONOABS 0.7 06/18/2021 1523   EOSABS 0.0 06/18/2021 1523   BASOSABS 0.0 06/18/2021 1523    BMET    Component Value Date/Time   NA 139 06/19/2021 0546   K 4.0 06/19/2021 0546   CL 106 06/19/2021 0546   CO2 22 06/19/2021 0546   GLUCOSE 158 (H) 06/19/2021 0546   BUN 15 06/19/2021 0546   CREATININE 1.00 06/19/2021 0546   CALCIUM 6.8 (L) 06/19/2021 0546   GFRNONAA >60 06/19/2021 0546   GFRAA >60 12/01/2019 2208    INR    Component Value Date/Time   INR 1.0 06/18/2021 1550     Intake/Output Summary (Last 24 hours) at 06/20/2021 0717 Last data filed at 06/20/2021 0436 Gross per 24 hour  Intake 358.42 ml  Output 1450 ml  Net -1091.58 ml     Assessment/Plan:  55 y.o. male is s/p:  He presented with acute on chronic ischemia of the right leg to the ED yesterday evening.  He had evidence of occluded common femoral and occluded tibials.  Underwent common femoral endarterectomy and right lower extremity thrombectomy with fasciotomy.  Rethrombosed his leg on  the floor and had to go back for popliteal cutdown with thrombectomy of the tibials   1 Day Post-Op   -pt with brisk right peroneal and DP doppler signals.  Motor improving as well as sensation.  Incisions look good -discussed with pt he needs to mobilize with PT today and OOB at meal times.   -DVT prophylaxis:  heparin gtt   Leontine Locket, PA-C [facility] [phone] 06/20/2021 7:17 AM  I have seen and evaluated the patient. I agree with the PA note as documented above.  Presented with acute on chronic ischemia of the right leg on Saturday evening.  Underwent right femoral endarterectomy with patch angioplasty and tibial thrombectomy from popliteal approach with fasciotomies.  Ultimately he rethrombosed his leg on the floor and had to go back Saturday night.  Underwent popliteal cutdown with tibial thrombectomy.  He has brisk PT signal this morning and foot is warm.  Fasciotomy sites closed with staples and groin incision look excellent.  Out of bed and mobilize today PT OT etc.  We will continue heparin and when he is closer to discharge will need DOAC given high risk limb.  [name], MD [facility] Office:  [phone]   

## 2021-06-21 LAB — CBC
HCT: 34.7 % — ABNORMAL LOW (ref 39.0–52.0)
Hemoglobin: 11.7 g/dL — ABNORMAL LOW (ref 13.0–17.0)
MCH: 33.2 pg (ref 26.0–34.0)
MCHC: 33.7 g/dL (ref 30.0–36.0)
MCV: 98.6 fL (ref 80.0–100.0)
Platelets: 158 10*3/uL (ref 150–400)
RBC: 3.52 MIL/uL — ABNORMAL LOW (ref 4.22–5.81)
RDW: 12.6 % (ref 11.5–15.5)
WBC: 7.7 10*3/uL (ref 4.0–10.5)
nRBC: 0 % (ref 0.0–0.2)

## 2021-06-21 LAB — HEPARIN LEVEL (UNFRACTIONATED)
Heparin Unfractionated: <0.1 [IU]/mL — ABNORMAL LOW (ref 0.30–0.70)
Heparin Unfractionated: 0.14 IU/mL — ABNORMAL LOW (ref 0.30–0.70)

## 2021-06-21 MED ORDER — ADULT MULTIVITAMIN W/MINERALS CH
1.0000 | ORAL_TABLET | Freq: Every day | ORAL | Status: DC
  Administered 2021-06-21 – 2021-06-22 (×2): 1 via ORAL
  Filled 2021-06-21 (×2): qty 1

## 2021-06-21 MED ORDER — ENSURE ENLIVE PO LIQD
237.0000 mL | Freq: Two times a day (BID) | ORAL | Status: DC
  Administered 2021-06-21 – 2021-06-22 (×2): 237 mL via ORAL

## 2021-06-21 NOTE — Progress Notes (Addendum)
  Progress Note    06/21/2021 7:27 AM 2 Days Post-Op  Subjective:  feeling better but still feels a  bit unsteady  Afebrile HR 50's-70's NSR 092'Z-300'T systolic 62% RA  Vitals:   06/20/21 2307 06/21/21 0441  BP: (!) 143/78 133/78  Pulse: 66 65  Resp:  18  Temp: 98.2 F (36.8 C) 97.8 F (36.6 C)  SpO2: 98% 97%    Physical Exam: Cardiac:  regular Lungs:  non labored Incisions:  mild bloody drainage from medial fasciotomy site; lateral site looks good as well as right groin incision.  Extremities:  brisk right PT > DP doppler signals.  Motor and sensation continue to improve.  CBC    Component Value Date/Time   WBC 7.7 06/21/2021 0418   RBC 3.52 (L) 06/21/2021 0418   HGB 11.7 (L) 06/21/2021 0418   HCT 34.7 (L) 06/21/2021 0418   PLT 158 06/21/2021 0418   MCV 98.6 06/21/2021 0418   MCH 33.2 06/21/2021 0418   MCHC 33.7 06/21/2021 0418   RDW 12.6 06/21/2021 0418   LYMPHSABS 0.8 06/18/2021 1523   MONOABS 0.7 06/18/2021 1523   EOSABS 0.0 06/18/2021 1523   BASOSABS 0.0 06/18/2021 1523    BMET    Component Value Date/Time   NA 139 06/19/2021 0546   K 4.0 06/19/2021 0546   CL 106 06/19/2021 0546   CO2 22 06/19/2021 0546   GLUCOSE 158 (H) 06/19/2021 0546   BUN 15 06/19/2021 0546   CREATININE 1.00 06/19/2021 0546   CALCIUM 6.8 (L) 06/19/2021 0546   GFRNONAA >60 06/19/2021 0546   GFRAA >60 12/01/2019 2208    INR    Component Value Date/Time   INR 1.0 06/18/2021 1550     Intake/Output Summary (Last 24 hours) at 06/21/2021 0727 Last data filed at 06/21/2021 0444 Gross per 24 hour  Intake 600 ml  Output 1500 ml  Net -900 ml     Assessment/Plan:  55 y.o. male who presented with acute on chronic ischemia of the right leg to the ED yesterday evening.  He had evidence of occluded common femoral and occluded tibials.  Underwent common femoral endarterectomy and right lower extremity thrombectomy with fasciotomy.  Rethrombosed his leg on the floor and had to  go back for popliteal cutdown with thrombectomy of the tibials    2 Days Post-Op   -pt continues to have brisk right PT > DP doppler signals.  Motor and sensation continue to improve. -not ready for discharge.  Will mobilize again today and possible discharge in the next couple of days.   -DVT prophylaxis:  continue heparin gtt until closer to dc and then will convert to DOAC.   Leontine Locket, PA-C [facility] [phone] 06/21/2021 7:27 AM  I have seen and evaluated the patient. I agree with the PA note as documented above.  Continues to have brisk right PT signal after presenting with acute on chronic ischemia over the weekend.  Underwent femoral endarterectomy with subsequently lower extremity thrombectomy from both a common femoral and tibial approach and required takeback.  Fasciotomies are closed and are nice and soft.  Continue heparin today.  Work with therapy.  Overall doing well.  Plan DOAC when closer to discharge.  [name], MD [facility] [place] Office: [phone]

## 2021-06-21 NOTE — Progress Notes (Signed)
Occupational Therapy Treatment Patient Details Name: Arthur Mcmahon MRN: [id number] DOB: December 26, 1965 Today's Date: 06/21/2021   History of present illness 55 y.o male admitted 06/18/21 R LE ischemia. Underwent R common femoral endarterectomy, thrombectomy, fasciotomy 10/15. Returned to OR for second RLE thrombectomy 10/16. PMHx: L LE vascular surgery 04/2021, HTN, PAD, and tobacco use disorder.   OT comments  Pt with improving R LE pain and able to demo increased activity with decreased need for physical assist. Pt overall Supervision for basic transfers using RW and improving WB tolerance. Pt able to demo functional skills to reach B feet for LB dressing without assist and discussed possible barriers for tub transfers with pt denying any concerns today. DC recs remain appropriate.    Recommendations for follow up therapy are one component of a multi-disciplinary discharge planning process, led by the attending physician.  Recommendations may be updated based on patient status, additional functional criteria and insurance authorization.    Follow Up Recommendations  No OT follow up    Equipment Recommendations  3 in 1 bedside commode (RW)    Recommendations for Other Services      Precautions / Restrictions Precautions Precautions: Fall Restrictions Weight Bearing Restrictions: No RLE Weight Bearing: Weight bearing as tolerated       Mobility Bed Mobility Overal bed mobility: Needs Assistance Bed Mobility: Sit to Supine       Sit to supine: Modified independent (Device/Increase time);HOB elevated   General bed mobility comments: HOB elevated    Transfers Overall transfer level: Needs assistance Equipment used: Rolling walker (2 wheeled) Transfers: Sit to/from [facility] Sit to Stand: Supervision Stand pivot transfers: Supervision       General transfer comment: no safety concerns or LOB, good pacing    Balance Overall balance assessment: Needs  assistance Sitting-balance support: No upper extremity supported Sitting balance-Leahy Scale: Good     Standing balance support: Bilateral upper extremity supported Standing balance-Leahy Scale: Fair Standing balance comment: fair static standing, benefits from UE support for dynamic tasks                           ADL either performed or assessed with clinical judgement   ADL Overall ADL's : Needs assistance/impaired                     Lower Body Dressing: Supervision/safety;Sit to/from stand Lower Body Dressing Details (indicate cue type and reason): able to reach down to R foot without difficulty Toilet Transfer: Supervision/safety;Stand-pivot;RW Toilet Transfer Details (indicate cue type and reason): simulated to bed       Tub/Shower Transfer Details (indicate cue type and reason): discussed tub transfers at home with pt reporting low threshold (similar to walk in shower) with fold down shower chair and grab bars. Pt denies any issues in lifting B feet up this high with support, able to demo marching in place to simulate   General ADL Comments: Focused on progression of LB ADls and tub transfer abilities with improving pain levels     Vision   Vision Assessment?: No apparent visual deficits   Perception     Praxis      Cognition Arousal/Alertness: Awake/alert Behavior During Therapy: WFL for tasks assessed/performed Overall Cognitive Status: Within Functional Limits for tasks assessed  Exercises     Shoulder Instructions       General Comments      Pertinent Vitals/ Pain       Pain Assessment: 0-10 Pain Score: 7  Pain Location: R LE Pain Descriptors / Indicators: Discomfort Pain Intervention(s): Monitored during session;Patient requesting pain meds-RN notified;Limited activity within patient's tolerance  Home Living                                           Prior Functioning/Environment              Frequency  Min 2X/week        Progress Toward Goals  OT Goals(current goals can now be found in the care plan section)  Progress towards OT goals: Progressing toward goals  Acute Rehab OT Goals Patient Stated Goal: Return home OT Goal Formulation: With patient Time For Goal Achievement: 07/04/21 Potential to Achieve Goals: Good ADL Goals Pt Will Perform Grooming: with modified independence;standing Pt Will Perform Lower Body Bathing: with modified independence;sit to/from stand Pt Will Perform Lower Body Dressing: with modified independence;sit to/from stand Pt Will Transfer to Toilet: with modified independence;ambulating Pt Will Perform Toileting - Clothing Manipulation and hygiene: with modified independence;sit to/from stand Pt Will Perform Tub/Shower Transfer: with modified independence;ambulating;shower seat  Plan Discharge plan remains appropriate    Co-evaluation                 AM-PAC OT "6 Clicks" Daily Activity     Outcome Measure   Help from another person eating meals?: None Help from another person taking care of personal grooming?: None Help from another person toileting, which includes using toliet, bedpan, or urinal?: A Little Help from another person bathing (including washing, rinsing, drying)?: A Little Help from another person to put on and taking off regular upper body clothing?: None Help from another person to put on and taking off regular lower body clothing?: A Little 6 Click Score: 21    End of Session Equipment Utilized During Treatment: Rolling walker  OT Visit Diagnosis: Unsteadiness on feet (R26.81);Other abnormalities of gait and mobility (R26.89);Pain   Activity Tolerance Patient tolerated treatment well   Patient Left in bed;with call bell/phone within reach;with nursing/sitter in room   Nurse Communication Mobility status        Time: 5993-5701 OT Time Calculation (min):  12 min  Charges: OT General Charges $OT Visit: 1 Visit OT Treatments $Self Care/Home Management : 8-22 mins  Malachy Chamber, OTR/L Acute Rehab Services Office: [phone]   Layla Maw 06/21/2021, 1:31 PM

## 2021-06-21 NOTE — Progress Notes (Signed)
ANTICOAGULATION CONSULT NOTE - Follow Up Consult  Pharmacy Consult for heparin Indication:  ischemic leg, s/p thrombectomy  Labs: Recent Labs    06/18/21 1523 06/18/21 1550 06/19/21 0546 06/19/21 1536 06/20/21 0108 06/20/21 1009 06/20/21 2011 06/21/21 0418  HGB 16.1  --  14.2  --  12.1*  --   --  11.7*  HCT 47.4  --  40.2  --  37.1*  --   --  34.7*  PLT 193  --  155  --  151  --   --  158  APTT  --  26  --   --   --   --   --   --   LABPROT  --  13.5  --   --   --   --   --   --   INR  --  1.0  --   --   --   --   --   --   HEPARINUNFRC  --   --  <0.10*   < > <0.10* <0.10* <0.10* <0.10*  CREATININE 1.19  --  1.00  --   --   --   --   --    < > = values in this interval not displayed.    Assessment: 55yo male remains subtherapeutic on heparin after conservtive rate change; no infusion issues per RN though she does state that hematuria is ongoing, seems to be lighter now.  Goal of Therapy:  Heparin level 0.3-0.7 units/ml   Plan:  Will increase heparin infusion by 3-4 units/kg/hr to 1900 units/hr and check level in 6 hours.    Wynona Neat, PharmD, BCPS  06/21/2021,5:44 AM

## 2021-06-21 NOTE — Progress Notes (Signed)
ANTICOAGULATION CONSULT NOTE - Follow Up Consult  Pharmacy Consult for Heparin Indication:  ischemic limb  No Known Allergies  Patient Measurements: Height: 6\' 2"  (188 cm) Weight: 82.5 kg (181 lb 14.1 oz) IBW/kg (Calculated) : 82.2 Heparin Dosing Weight: TBW (BMI 23)  Vital Signs: Temp: 97.8 F (36.6 C) (10/18 1543) Temp Source: Oral (10/18 1543) BP: 144/72 (10/18 1543) Pulse Rate: 68 (10/18 1543)  Labs: Recent Labs    06/19/21 0546 06/19/21 1536 06/20/21 0108 06/20/21 1009 06/20/21 2011 06/21/21 0418 06/21/21 1152  HGB 14.2  --  12.1*  --   --  11.7*  --   HCT 40.2  --  37.1*  --   --  34.7*  --   PLT 155  --  151  --   --  158  --   HEPARINUNFRC <0.10*   < > <0.10*   < > <0.10* <0.10* 0.14*  CREATININE 1.00  --   --   --   --   --   --    < > = values in this interval not displayed.     Estimated Creatinine Clearance: 98.2 mL/min (by C-G formula based on SCr of 1 mg/dL).  No results found for: AT3    Medications:  Scheduled:   aspirin EC  81 mg Oral Daily   atorvastatin  40 mg Oral Daily   clopidogrel  75 mg Oral Q0600   docusate sodium  100 mg Oral Daily   feeding supplement  237 mL Oral BID BM   multivitamin with minerals  1 tablet Oral Daily   pantoprazole  40 mg Oral Daily   Infusions:   sodium chloride     sodium chloride 75 mL/hr at 06/19/21 1807   heparin 1,900 Units/hr (06/21/21 1122)   magnesium sulfate bolus IVPB      Assessment: 55 YO male who presented on 10/15 with chief complaint of numbness in leg. He was evaluated by [facility] and subsequently underwent R femoral artery endarterectomy/thrombectomy/fasciotomy. [facility] had started patient on fixed-dose IV heparin at 500 units/h after surgery. Pharmacy asked to dose IV heparin without bolus.   Heparin level remains undetectable after rate increases. CBC stable.  Discussed with RN. No line issues or known disruptions to heparin infusion. AT3 returned at lower end of normal (74, range 75-120). RN  does report bloody urine since foley pull, continuing tonight. She states MD aware. Will be more conservative with rate increase with new hematuria.  Heparin level this afternoon remains below goal at 0.14.  Hematuria ongoing, but no worse or better per RN.  Goal of Therapy:  Heparin level 0.3-0.7 units/ml Monitor platelets by anticoagulation protocol: Yes   Plan:  - Increase heparin infusion just a little to 2000 units/hr - Check heparin level in 6 hours - Monitor daily CBC, s/sx bleeding   [place] Crane, Vena [place], BCPS, BCCP Clinical Pharmacist  06/21/2021 6:12 PM   [facility] pharmacy phone numbers are listed on [email or web address]

## 2021-06-21 NOTE — Progress Notes (Signed)
Initial Nutrition Assessment  DOCUMENTATION CODES:   Non-severe (moderate) malnutrition in context of chronic illness  INTERVENTION:   Ensure Enlive po BID, each supplement provides 350 kcal and 20 grams of protein. MVI with minerals daily.  NUTRITION DIAGNOSIS:   Moderate Malnutrition related to chronic illness (PVD) as evidenced by mild muscle depletion, moderate muscle depletion, mild fat depletion, moderate fat depletion, percent weight loss (9% weight loss within 3 months).  GOAL:   Patient will meet greater than or equal to 90% of their needs  MONITOR:   PO intake, Supplement acceptance, Skin, Labs  REASON FOR ASSESSMENT:        ASSESSMENT:   55 yo male admitted with acute on chronic ischemia of R leg. PMH includes HTN, ETOH abuse, PVD, anemia.  S/P femoral endarterectomy and RLE thrombectomy and fasciotomies on 10/15. Returned to the OR on 10/16 for repeat RLE thrombectomy.  Patient reports recent poor appetite for 1-2 months r/t leg pain associated with acute illness and weight loss. He would eat when he was hungry, but he would not eat if he was not hungry. He has been less active than usual d/t leg pain. He was eating ~half of his usual intake. He tried Ensure and Boost PTA (suggested by his roommate) and he likes both vanilla and chocolate. He agreed to drink Ensure BID to maximize intake of protein and calories.   Labs reviewed. Medications reviewed and include Colace, Protonix.  Weight history reviewed. Weight is down by 9% over the past 2 months.  Patient meets criteria for moderate malnutrition with mild-moderate depletion of muscle and subcutaneous fat mass and 9% weight loss within 3 months.  NUTRITION - FOCUSED PHYSICAL EXAM:  Flowsheet Row Most Recent Value  Orbital Region Mild depletion  Upper Arm Region Moderate depletion  Thoracic and Lumbar Region Moderate depletion  Buccal Region No depletion  Temple Region Moderate depletion  Clavicle Bone  Region Moderate depletion  Clavicle and Acromion Bone Region Moderate depletion  Scapular Bone Region Moderate depletion  Dorsal Hand Mild depletion  Patellar Region Mild depletion  Anterior Thigh Region Mild depletion  Posterior Calf Region Mild depletion  Edema (RD Assessment) Mild  [RLE]  Hair Reviewed  Eyes Reviewed  Mouth Reviewed  Skin Reviewed  Nails Reviewed       Diet Order:   Diet Order             Diet Heart Room service appropriate? Yes; Fluid consistency: Thin  Diet effective now                   EDUCATION NEEDS:   Education needs have been addressed  Skin:  Skin Assessment: Skin Integrity Issues: Skin Integrity Issues:: Incisions Incisions: leg and groin  Last BM:  10/15  Height:   Ht Readings from Last 1 Encounters:  06/18/21 6\' 2"  (1.88 m)    Weight:   Wt Readings from Last 1 Encounters:  06/19/21 82.5 kg    BMI:  Body mass index is 23.35 kg/m.  Estimated Nutritional Needs:   Kcal:  2300-2500  Protein:  110-130 gm  Fluid:  >/= 2.4 L    Lucas Mallow, RD, LDN, CNSC Please refer to Amion for contact information.

## 2021-06-21 NOTE — Progress Notes (Signed)
Mobility Specialist Progress Note:   06/21/21 1200  Therapy Vitals  Pulse Rate 70  Mobility  Activity Ambulated in room  Level of Assistance Minimal assist, patient does 75% or more  Assistive Device Front wheel walker  RLE Weight Bearing WBAT  Distance Ambulated (ft) 10 ft  Mobility Ambulated with assistance in room  Mobility Response Tolerated well  Mobility performed by Mobility specialist  Bed Position Chair  $Mobility charge 1 Mobility   Pre Mobility: HR 70 bpm Post Mobility: HR 77 bpm  Pt received in bed, agreed to mobility. Required minA to stand from EOB. Ambulated 10' with RW and supervision. Pt stated he put more weight on RLE than yesterday. Distance limited d/t fatigue. RLE incision with some bleeding, RN notified. Pt left in chair with chair alarm on.   Nelta Numbers Mobility Specialist  Phone [phone]

## 2021-06-22 ENCOUNTER — Other Ambulatory Visit (HOSPITAL_COMMUNITY): Payer: Self-pay

## 2021-06-22 LAB — HEPARIN LEVEL (UNFRACTIONATED)
Heparin Unfractionated: 0.32 IU/mL (ref 0.30–0.70)
Heparin Unfractionated: 0.26 IU/mL — ABNORMAL LOW (ref 0.30–0.70)

## 2021-06-22 LAB — CBC
HCT: 34.7 % — ABNORMAL LOW (ref 39.0–52.0)
Hemoglobin: 11.9 g/dL — ABNORMAL LOW (ref 13.0–17.0)
MCH: 33.3 pg (ref 26.0–34.0)
MCHC: 34.3 g/dL (ref 30.0–36.0)
MCV: 97.2 fL (ref 80.0–100.0)
Platelets: 208 10*3/uL (ref 150–400)
RBC: 3.57 MIL/uL — ABNORMAL LOW (ref 4.22–5.81)
RDW: 12.4 % (ref 11.5–15.5)
WBC: 7.5 10*3/uL (ref 4.0–10.5)
nRBC: 0 % (ref 0.0–0.2)

## 2021-06-22 MED ORDER — APIXABAN 5 MG PO TABS
10.0000 mg | ORAL_TABLET | Freq: Two times a day (BID) | ORAL | Status: DC
  Administered 2021-06-22: 10 mg via ORAL
  Filled 2021-06-22: qty 2

## 2021-06-22 MED ORDER — RIVAROXABAN 2.5 MG PO TABS
2.5000 mg | ORAL_TABLET | Freq: Two times a day (BID) | ORAL | Status: DC
  Filled 2021-06-22: qty 1

## 2021-06-22 MED ORDER — ADULT MULTIVITAMIN W/MINERALS CH
1.0000 | ORAL_TABLET | Freq: Every day | ORAL | Status: DC
Start: 2021-06-22 — End: 2021-08-10

## 2021-06-22 MED ORDER — APIXABAN 5 MG PO TABS: 5.0000 mg | ORAL_TABLET | Freq: Two times a day (BID) | ORAL | Status: DC

## 2021-06-22 MED ORDER — APIXABAN (ELIQUIS) VTE STARTER PACK (10MG AND 5MG)
ORAL_TABLET | ORAL | 0 refills | Status: DC
  Filled 2021-06-22: qty 74, 30d supply, fill #0

## 2021-06-22 MED ORDER — HYDROCODONE-ACETAMINOPHEN 5-325 MG PO TABS
1.0000 | ORAL_TABLET | Freq: Four times a day (QID) | ORAL | 0 refills | Status: DC | PRN
  Filled 2021-06-22: qty 30, 7d supply, fill #0

## 2021-06-22 MED ORDER — APIXABAN 5 MG PO TABS
5.0000 mg | ORAL_TABLET | Freq: Two times a day (BID) | ORAL | 11 refills | Status: DC
  Filled 2021-06-22: qty 60, 30d supply, fill #0

## 2021-06-22 NOTE — Progress Notes (Signed)
Urine previously clear yellow with blood clots now dark reddish. Pt denies any pain when urinating.

## 2021-06-22 NOTE — TOC Benefit Eligibility Note (Signed)
Patient Advocate Encounter  Insurance verification completed.    The patient is uninsured  Kadence Mimbs, CPhT Pharmacy Patient Advocate Specialist [facility] Team Direct Number: [phone]  Fax: [phone]        

## 2021-06-22 NOTE — Progress Notes (Signed)
Pt discharged from unit. Medication/discharge instruction given  Krina Mraz K Jaleya Pebley, RN  

## 2021-06-22 NOTE — Progress Notes (Signed)
ANTICOAGULATION CONSULT NOTE - Follow Up Consult  Pharmacy Consult for Heparin>>xarelto Indication:  ischemic limb  No Known Allergies  Patient Measurements: Height: 6\' 2"  (188 cm) Weight: 82.5 kg (181 lb 14.1 oz) IBW/kg (Calculated) : 82.2 Heparin Dosing Weight: TBW (BMI 23)  Vital Signs: Temp: 98 F (36.7 C) (10/19 0810) Temp Source: Oral (10/19 0810) BP: 129/86 (10/19 0810) Pulse Rate: 70 (10/19 0810)  Labs: Recent Labs    06/20/21 0108 06/20/21 1009 06/21/21 0418 06/21/21 1152 06/22/21 0126  HGB 12.1*  --  11.7*  --  11.9*  HCT 37.1*  --  34.7*  --  34.7*  PLT 151  --  158  --  208  HEPARINUNFRC <0.10*   < > <0.10* 0.14* 0.32   < > = values in this interval not displayed.     Estimated Creatinine Clearance: 98.2 mL/min (by C-G formula based on SCr of 1 mg/dL).No results found for: AT3   Assessment: 55 YO male s/p R femoral artery endarterectomy/thrombectomy/ fasciotomy. [facility] had started patient on fixed-dose IV heparin at 500 units/h after surgery.   Heparin level below goal at 0.26 on recheck this morning. Hematuria resolving per RN now more clear urine with a few small clots per overnight report. New orders to transition to apixaban.   Patient does not have insurance, will need to utilize 30 day free card at discharge.   Goal of Therapy:  Heparin level 0.3-0.7 units/ml Monitor platelets by anticoagulation protocol: Yes   Plan:  Transition to apixaban with aspirin at d/c  Erin Hearing PharmD., BCPS Clinical Pharmacist 06/22/2021 8:40 AM

## 2021-06-22 NOTE — Progress Notes (Signed)
Physical Therapy Treatment Patient Details Name: Arthur Mcmahon MRN: [id number] DOB: 1966-02-22 Today's Date: 06/22/2021   History of Present Illness 55 y.o male admitted 06/18/21 R LE ischemia. Underwent R common femoral endarterectomy, thrombectomy, fasciotomy 10/15. Returned to OR for second RLE thrombectomy 10/16. PMHx: L LE vascular surgery 04/2021, HTN, PAD, and tobacco use disorder.    PT Comments    Pt tolerated ambulating up to ~20 ft with a RW and min guard assist before needing to urinate then requesting to return to bed due to lethargy. Educated pt on sequencing of feet for stairs and on positioning and stretching of R leg to prevent loss of ROM as he tends to place his R knee and hip in flexion with ankle plantarflexion during gait and reports stiffness. Will continue to follow acutely. Current recommendations remain appropriate.    Recommendations for follow up therapy are one component of a multi-disciplinary discharge planning process, led by the attending physician.  Recommendations may be updated based on patient status, additional functional criteria and insurance authorization.  Follow Up Recommendations  Supervision - Intermittent;No PT follow up     Equipment Recommendations  Rolling walker with 5" wheels    Recommendations for Other Services       Precautions / Restrictions Precautions Precautions: Fall Restrictions Weight Bearing Restrictions: No RLE Weight Bearing: Weight bearing as tolerated     Mobility  Bed Mobility Overal bed mobility: Needs Assistance Bed Mobility: Supine to Sit;Sit to Supine     Supine to sit: Supervision;HOB elevated Sit to supine: Supervision;HOB elevated   General bed mobility comments: Extra time but supervision for safety only.    Transfers Overall transfer level: Needs assistance Equipment used: Rolling walker (2 wheeled) Transfers: Sit to/from Stand Sit to Stand: Min guard         General transfer comment:  Extra time and cues for hand placement, min guard to transfer to stand from EOB with pt initiating placement of R foot anteriorly for pain management.  Ambulation/Gait Ambulation/Gait assistance: Min guard Gait Distance (Feet): 20 Feet Assistive device: Rolling walker (2 wheeled) Gait Pattern/deviations: Step-to pattern;Decreased stride length;Decreased weight shift to right;Antalgic;Decreased dorsiflexion - right Gait velocity: decreased Gait velocity interpretation: <1.31 ft/sec, indicative of household ambulator General Gait Details: Pt keeping R knee flexed with ankle plantarflexed, limiting amount of weight accepted on R foot. Educated pt to perform lateral weight shifting to prepare for gait. No LOB, but extra time due to antalgic gait pattern, no LOB. Educated pt on going up with L foot and down with R for stairs.   Stairs             Wheelchair Mobility    Modified Rankin (Stroke Patients Only)       Balance Overall balance assessment: Needs assistance Sitting-balance support: No upper extremity supported Sitting balance-Leahy Scale: Good     Standing balance support: Bilateral upper extremity supported Standing balance-Leahy Scale: Poor Standing balance comment: UE support for mobility.                            Cognition Arousal/Alertness: Awake/alert Behavior During Therapy: WFL for tasks assessed/performed Overall Cognitive Status: Within Functional Limits for tasks assessed                                        Exercises Other Exercises Other Exercises:  Educated pt on self ankle dorsiflexion stretch with sheet Other Exercises: educated pt on positioning R lower leg on pillow to allow gravity and quad sets to improve R knee extension ROM Other Exercises: educated pt on hamstring stretches    General Comments        Pertinent Vitals/Pain Pain Assessment: Faces Faces Pain Scale: Hurts even more Pain Location: R leg Pain  Descriptors / Indicators: Discomfort;Grimacing;Operative site guarding Pain Intervention(s): Monitored during session;Limited activity within patient's tolerance;Repositioned    Home Living                      Prior Function            PT Goals (current goals can now be found in the care plan section) Acute Rehab PT Goals Patient Stated Goal: to go home PT Goal Formulation: With patient Time For Goal Achievement: 07/04/21 Potential to Achieve Goals: Good Progress towards PT goals: Progressing toward goals    Frequency    Min 3X/week      PT Plan Current plan remains appropriate    Co-evaluation              AM-PAC PT "6 Clicks" Mobility   Outcome Measure  Help needed turning from your back to your side while in a flat bed without using bedrails?: A Little Help needed moving from lying on your back to sitting on the side of a flat bed without using bedrails?: A Little Help needed moving to and from a bed to a chair (including a wheelchair)?: A Little Help needed standing up from a chair using your arms (e.g., wheelchair or bedside chair)?: A Little Help needed to walk in hospital room?: A Little Help needed climbing 3-5 steps with a railing? : A Little 6 Click Score: 18    End of Session Equipment Utilized During Treatment: Gait belt Activity Tolerance: Patient limited by pain Patient left: in bed;with call bell/phone within reach   PT Visit Diagnosis: Other abnormalities of gait and mobility (R26.89);Muscle weakness (generalized) (M62.81);Unsteadiness on feet (R26.81);Difficulty in walking, not elsewhere classified (R26.2);Pain Pain - Right/Left: Right Pain - part of body: Leg     Time: 5631-4970 PT Time Calculation (min) (ACUTE ONLY): 17 min  Charges:  $Therapeutic Activity: 8-22 mins                     Moishe Spice, PT, DPT Acute Rehabilitation Services  Pager: [phone] Office: [phone]    Orvan Falconer 06/22/2021, 1:51  PM

## 2021-06-22 NOTE — Discharge Summary (Signed)
Discharge Summary     Arthur Mcmahon 1965/10/14 55 y.o. male  [id number]  Admission Date: 06/18/2021  Discharge Date: 06/22/2021  Physician: [name], MD  Admission Diagnosis: Critical limb ischemia of right lower extremity ([place]) [I70.221] Status post surgery [Z98.890] Ischemia of extremity [I99.8]  HPI:   This is a 55 y.o. male with history of hypertension and tobacco abuse and peripheral vascular disease who presents to the ED with ischemic right leg.  Patient states he had acute onset of pain, numbness, and weakness in the right foot that started about 930 this morning.  States he was getting into his recliner.  Unable to move his foot or toes.  He was just seen in our office last week and was doing well.  On 04/11/2021 he underwent a left common femoral endarterectomy with bovine patch and then antegrade stenting of the left above-knee popliteal artery for tissue loss by myself.  Ultimately had a second toe amputation that healed.  He has no complaints in the left leg.  States he has not been compliant with his aspirin and Plavix.  Hospital Course:  The patient was admitted to the hospital and taken to the operating room on 06/19/2021 and underwent: 1.  Right common femoral endarterectomy including profundoplasty with bovine pericardial patch angioplasty 2.  Right lower extremity thrombectomy from femoral approach 3.  Right lower extremity 4 compartment fasciotomy    Findings: The right common femoral artery was completely occluded and was heavily calcified.  Ultimately the common femoral was endarterectomized including the distal external iliac and eversion endarterectomy of the profunda with excellent back bleeding.  We stopped the endarterectomy at the takeoff of the SFA given there was no good distal endpoint as far as we could dissect out the SFA.  This endpoint was tacked down.  I was able to pass #3 Fogarty's all the way into the foot and made a total of 4  passes retrieving multiple plugs of long acute thrombus.  We then had good backbleeding from the SFA and profunda with excellent inflow.  Bovine pericardial patch was sewn to the right common femoral artery.  Palpable right femoral pulse.  Right lower extremity 4 compartment fasciotomies were performed.  There was a monophasic PT signal at completion  The pt tolerated the procedure well and was transported to the PACU in good condition.   Later that evening, Signal lost on the floor now with a mottled foot.  Will return to the operating room for right lower extremity thrombectomy possible bypass.  She was taken back to the operating room and underwent right lower extremity thrombectomy via popliteal approach.   Findings: Cutdown on the below-knee popliteal artery where there was no pulse.  Ultimately the AT PT peroneal were all dissected out controlled with Vesseloops.  I opened the below-knee popliteal artery and there was acute thrombus and after thrombectomy proximal we had excellent pulsatile inflow and retrieved large plugs of acute thrombus.  I then passed #3 Fogarty's down the anterior tibial into the foot and did not retrieve any thrombus.  I was able to pass #3 Fogarty into the PT into the foot and did retrieve several additional plugs of thrombus.  The peroneal was diseased and the Fogarty would not go down the peroneal.  The arteriotomy was closed with interrupted 6-0 Prolene's.  There was a brisk PT signal at completion.  The peroneal and AT signal sounded occlusive at the ankle.  By POD 1, his leg was feeling much  better and had an excellent PT doppler signal and foot was warm.  His fasciotomy sites were clean and groin incision looked good.  He is on heparin.   During his hospital course, his foot continued to improve and he was having increasing ambulation.  PT recommended RW.  Prior to discharge, his heparin will be converted over to DOAC and plavix discontinued.     CT in the ED was  concerning for primary bladder malignancy with nodal metastatic disease and bulky retroperitoneal lymph nodes.  I have discussed with urology and Dr. Diona Fanti will arrange a urgent referral to their office.  We will plan discharge today.  I will see him in 2 to 3 weeks for incision checks.  Will get him a rolling walker.    Discussion with pt that it will be critical to continue blood thinners as he is at high risk for limb loss without them.  Stressed importance of filling out paperwork for assistance with the medication.    CBC    Component Value Date/Time   WBC 7.5 06/22/2021 0126   RBC 3.57 (L) 06/22/2021 0126   HGB 11.9 (L) 06/22/2021 0126   HCT 34.7 (L) 06/22/2021 0126   PLT 208 06/22/2021 0126   MCV 97.2 06/22/2021 0126   MCH 33.3 06/22/2021 0126   MCHC 34.3 06/22/2021 0126   RDW 12.4 06/22/2021 0126   LYMPHSABS 0.8 06/18/2021 1523   MONOABS 0.7 06/18/2021 1523   EOSABS 0.0 06/18/2021 1523   BASOSABS 0.0 06/18/2021 1523    BMET    Component Value Date/Time   NA 139 06/19/2021 0546   K 4.0 06/19/2021 0546   CL 106 06/19/2021 0546   CO2 22 06/19/2021 0546   GLUCOSE 158 (H) 06/19/2021 0546   BUN 15 06/19/2021 0546   CREATININE 1.00 06/19/2021 0546   CALCIUM 6.8 (L) 06/19/2021 0546   GFRNONAA >60 06/19/2021 0546   GFRAA >60 12/01/2019 2208     Discharge Instructions     Discharge patient   Complete by: As directed    Dc once HH needs and once TOC issues arranged.   Discharge disposition: 01-Home or Self Care   Discharge patient date: 06/22/2021       Discharge Diagnosis:  Critical limb ischemia of right lower extremity ([place]) [I70.221] Status post surgery [Z98.890] Ischemia of extremity [I99.8]  Secondary Diagnosis: Patient Active Problem List   Diagnosis Date Noted   Status post surgery 06/18/2021   Ischemia of extremity 06/18/2021   PAOD (peripheral arterial occlusive disease) ([place]) 04/11/2021   Tobacco use disorder 04/08/2021   Hematuria with  proteinuria 03/26/2021   Peripheral arterial disease ([place]) 03/26/2021   Foot pain, bilateral 03/26/2021   Discoloration of skin of toe 03/26/2021   Past Medical History:  Diagnosis Date   Anemia    ETOH abuse    Hypertension    Peripheral vascular disease (HCC)    Pneumonia      Allergies as of 06/22/2021   No Known Allergies      Medication List     STOP taking these medications    clopidogrel 75 MG tablet Commonly known as: PLAVIX       TAKE these medications    acetaminophen 500 MG tablet Commonly known as: TYLENOL Take 500 mg by mouth every 6 (six) hours as needed for mild pain, fever or headache.   Apixaban Starter Pack (10mg  and 5mg ) Commonly known as: ELIQUIS STARTER PACK Take as directed on package: start with two-5mg  tablets  twice daily for 7 days. On day 8, switch to one-5mg  tablet twice daily.   apixaban 5 MG Tabs tablet Commonly known as: ELIQUIS Take 1 tablet (5 mg total) by mouth 2 (two) times daily. Start taking on: June 29, 2021   aspirin EC 81 MG tablet Take 1 tablet (81 mg total) by mouth daily. Swallow whole.   atorvastatin 40 MG tablet Commonly known as: Lipitor Take 1 tablet (40 mg total) by mouth daily.   HYDROcodone-acetaminophen 5-325 MG tablet Commonly known as: Norco Take 1 tablet by mouth every 6 (six) hours as needed for moderate pain.   multivitamin with minerals Tabs tablet Take 1 tablet by mouth daily.               Durable Medical Equipment  (From admission, onward)           Start     Ordered   06/21/21 0659  For home use only DME Walker rolling  Once       Comments: S/p thrombectomy and 4 compartment fasciotomy  Question Answer Comment  Walker: With [place] Wheels   Patient needs a walker to treat with the following condition Lower limb ischemia      06/21/21 0659            Discharge Instructions: [facility] Discharge instructions Lower Extremity Bypass  Surgery  Please refer to the following instruction for your post-procedure care. Your surgeon or physician assistant will discuss any changes with you.  Activity  You are encouraged to walk as much as you can. You can slowly return to normal activities during the month after your surgery. Avoid strenuous activity and heavy lifting until your doctor tells you it's OK. Avoid activities such as vacuuming or swinging a golf club. Do not drive until your doctor give the OK and you are no longer taking prescription pain medications. It is also normal to have difficulty with sleep habits, eating and bowel movement after surgery. These will go away with time.  Bathing/Showering  You may shower after you go home. Do not soak in a bathtub, hot tub, or swim until the incision heals completely.  Incision Care  Clean your incision with mild soap and water. Shower every day. Pat the area dry with a clean towel. You do not need a bandage unless otherwise instructed. Do not apply any ointments or creams to your incision. If you have open wounds you will be instructed how to care for them or a visiting nurse may be arranged for you. If you have staples or sutures along your incision they will be removed at your post-op appointment. You may have skin glue on your incision. Do not peel it off. It will come off on its own in about one week.  Wash the groin wound with soap and water daily and pat dry. (No tub bath-only shower)  Then put a dry gauze or washcloth in the groin to keep this area dry to help prevent wound infection.  Do this daily and as needed.  Do not use Vaseline or neosporin on your incisions.  Only use soap and water on your incisions and then protect and keep dry.  Diet  Resume your normal diet. There are no special food restrictions following this procedure. A low fat/ low cholesterol diet is recommended for all patients with vascular disease. In order to heal from your surgery, it is CRITICAL to  get adequate nutrition. Your body requires vitamins,  minerals, and protein. Vegetables are the best source of vitamins and minerals. Vegetables also provide the perfect balance of protein. Processed food has little nutritional value, so try to avoid this.  Medications  Resume taking all your medications unless your doctor or Physician Assistant tells you not to. If your incision is causing pain, you may take over-the-counter pain relievers such as acetaminophen (Tylenol). If you were prescribed a stronger pain medication, please aware these medication can cause nausea and constipation. Prevent nausea by taking the medication with a snack or meal. Avoid constipation by drinking plenty of fluids and eating foods with high amount of fiber, such as fruits, vegetables, and grains. Take Colace 100 mg (an over-the-counter stool softener) twice a day as needed for constipation.  Do not take Tylenol if you are taking prescription pain medications.  Follow Up  Our office will schedule a follow up appointment 2-3 weeks following discharge.  Please call us immediately for any of the following conditions  Severe or worsening pain in your legs or feet while at rest or while walking Increase pain, redness, warmth, or drainage (pus) from your incision site(s) Fever of 101 degree or higher The swelling in your leg with the bypass suddenly worsens and becomes more painful than when you were in the hospital If you have been instructed to feel your graft pulse then you should do so every day. If you can no longer feel this pulse, call the office immediately. Not all patients are given this instruction.  Leg swelling is common after leg bypass surgery.  The swelling should improve over a few months following surgery. To improve the swelling, you may elevate your legs above the level of your heart while you are sitting or resting. Your surgeon or physician assistant may ask you to apply an ACE wrap or wear compression  (TED) stockings to help to reduce swelling.  Reduce your risk of vascular disease  Stop smoking. If you would like help call [facility] at 1-800-QUIT-NOW 313-456-4149) or [place] at 754-825-3181.  Manage your cholesterol Maintain a desired weight Control your diabetes weight Control your diabetes Keep your blood pressure down  If you have any questions, please call the office at [phone]   Prescriptions given: 1.  Norco#30 No Refill 2.  Eliquis starter pack and Eliquis 5mg  bid  # 60 eleven refills 3.  Multivitamin OTC  Disposition: home  Patient's condition: is Good  Follow up: 1. [facility] in 2-3 weeks 2. [facility] (Dr. Diona Fanti setting up appt)   Leontine Locket, PA-C [facility] [phone] 06/22/2021  12:00 PM  - For VQI Registry use ---   Post-op:  Wound infection: No  Graft infection: No  Transfusion: No    If yes, n/a units given New Arrhythmia: No Ipsilateral amputation: No, [ ]  Minor, [ ]  BKA, [ ]  AKA Discharge patency: [x ] Primary, [ ]  Primary assisted, [ ]  Secondary, [ ]  Occluded Patency judged by: [x ] Dopper only, [ ]  Palpable graft pulse, []  Palpable distal pulse, [ ]  ABI inc. > 0.15, [ ]  Duplex Discharge ABI: R not done, L  D/C Ambulatory Status: Ambulatory with Assistance  Complications: MI: No, [ ]  Troponin only, [ ]  EKG or Clinical CHF: No Resp failure:No, [ ]  Pneumonia, [ ]  Ventilator Chg in renal function: No, [ ]  Inc. Cr > 0.5, [ ]  Temp. Dialysis,  [ ]  Permanent dialysis Stroke: No, [ ]  Minor, [ ]  Major Return to OR: No  Reason for  return to OR: [ ]  Bleeding, [ ]  Infection, [ ]  Thrombosis, [ ]  Revision  Discharge medications: Statin use:  yes ASA use:  yes Plavix use:  no Beta blocker use: no CCB use:  No ACEI use:   no ARB use:  no Coumadin use: no Xarelto:  yes

## 2021-06-22 NOTE — Progress Notes (Addendum)
Progress Note    06/22/2021 6:55 AM 3 Days Post-Op  Subjective:  still having a some pain.  Having trouble weight bearing on the right foot but better than when he got to the hospital.  Wants to go home.  Afebrile HR 60's-70's NSR 161'W-960'A systolic 540% RA  Vitals:   06/21/21 2329 06/22/21 0405  BP: (!) 146/78 (!) 136/92  Pulse: 66 73  Resp:    Temp: 98 F (36.7 C) 97.7 F (36.5 C)  SpO2: 98% 100%    Physical Exam: Cardiac:  regular Lungs:  non labored Incisions:  right groin is clean and healing nicely.  Extremities:  brisk bilateral PT doppler signals.  Toe amp site on the left foot healing nicely.     CBC    Component Value Date/Time   WBC 7.5 06/22/2021 0126   RBC 3.57 (L) 06/22/2021 0126   HGB 11.9 (L) 06/22/2021 0126   HCT 34.7 (L) 06/22/2021 0126   PLT 208 06/22/2021 0126   MCV 97.2 06/22/2021 0126   MCH 33.3 06/22/2021 0126   MCHC 34.3 06/22/2021 0126   RDW 12.4 06/22/2021 0126   LYMPHSABS 0.8 06/18/2021 1523   MONOABS 0.7 06/18/2021 1523   EOSABS 0.0 06/18/2021 1523   BASOSABS 0.0 06/18/2021 1523    BMET    Component Value Date/Time   NA 139 06/19/2021 0546   K 4.0 06/19/2021 0546   CL 106 06/19/2021 0546   CO2 22 06/19/2021 0546   GLUCOSE 158 (H) 06/19/2021 0546   BUN 15 06/19/2021 0546   CREATININE 1.00 06/19/2021 0546   CALCIUM 6.8 (L) 06/19/2021 0546   GFRNONAA >60 06/19/2021 0546   GFRAA >60 12/01/2019 2208    INR    Component Value Date/Time   INR 1.0 06/18/2021 1550     Intake/Output Summary (Last 24 hours) at 06/22/2021 0655 Last data filed at 06/22/2021 0406 Gross per 24 hour  Intake 1249.25 ml  Output 2675 ml  Net -1425.75 ml     Assessment/Plan:  55 y.o. male who presented with acute on chronic ischemia of the right leg to the ED yesterday evening.  He had evidence of occluded common femoral and occluded tibials.  Underwent common femoral endarterectomy and right lower extremity thrombectomy with fasciotomy.   Rethrombosed his leg on the floor and had to go back for popliteal cutdown with thrombectomy of the tibials     3 Days Post-Op   -pt doing well this am with brisk doppler signals bilaterally and toe amp is healing nicely.   -right foot improving.  Wants to go home today. -will ask pharmacy and TOC to investigate pricing for DOAC for pt to discharge on . -DVT prophylaxis:  heparin gtt but will convert to po prior to dc later today    Leontine Locket, PA-C [facility] [phone] 06/22/2021 6:55 AM  I have seen and evaluated the patient. I agree with the PA note as documented above.  55 year old male presented over the weekend with acute on chronic ischemia of the right lower extremity.  He underwent femoral endarterectomy with patch angioplasty and lower extremity thrombectomy with fasciotomy and additional takeback to the OR for tibial cutdowns after he thrombosed his leg a second time.  He has a very brisk PT signal now.  His fasciotomies are closed with staples and look good.  Groin incision looks good.  Plan is discharge today on aspirin DOAC.  CT in the ED was concerning for primary bladder malignancy with nodal  metastatic disease and bulky retroperitoneal lymph nodes.  I have discussed with urology and Dr. Diona Fanti will arrange a urgent referral to their office.  We will plan discharge today.  I will see him in 2 to 3 weeks for incision checks.  Will get him a rolling walker.    [name], MD [facility] [place] Office: [phone]

## 2021-06-22 NOTE — TOC Transition Note (Signed)
Transition of Care (TOC) - CM/SW Discharge Note Marvetta Gibbons RN, BSN Transitions of Care Unit 4E- RN Case Manager See Treatment Team for direct phone #    Patient Details  Name: Arthur Mcmahon MRN: [id number] Date of Birth: 05-03-1966  Transition of Care [facility]) CM/SW Contact:  Dawayne Patricia, RN Phone Number: 06/22/2021, 12:15 PM   Clinical Narrative:    Pt stable for transition home today, DME- RW has been placed- call made to [employer] for charity referral- RW has been delivered to the room for pt.  Per Vascular plan to start pt on Eliquis- pt is un-insured and will be able to receive 30 day free with drug coupon - TOC pharmacy to fill. Pt will need to apply for pt assistance program for ongoing medication assistance.  CM spoke with pt at bedside an provided pt with pt application for Eliquis pt assistance program.  Also discussed with pt applying for Medicaid- info provided to pt on Medicaid eligibility and how to apply.  Pt reports he has PCP with [facility] and will follow up with Vascular- Dr. Carlis Abbott as well.  Pt states he has also been set up with urology- and has spoken with someone from that office regarding a payment plan for office visits.      Final next level of care: Home/Self Care Barriers to Discharge: No Barriers Identified   Patient Goals and CMS Choice Patient states their goals for this hospitalization and ongoing recovery are:: return home   Choice offered to / list presented to : NA  Discharge Placement               home        Discharge Plan and Services   Discharge Planning Services: Medication Assistance Post Acute Care Choice: NA          DME Arranged: Walker rolling DME Agency: [employer] Date DME Agency Contacted: 06/22/21 Time DME Agency Contacted: [phone] Representative spoke with at DME Agency: Freda Munro HH Arranged: NA [place] Agency: NA        Social Determinants of Health ([place]) Interventions     Readmission Risk  Interventions Readmission Risk Prevention Plan 06/22/2021  Post Dischage Appt Complete  Medication Screening Complete  Transportation Screening Complete  Some recent data might be hidden

## 2021-06-22 NOTE — Progress Notes (Signed)
ANTICOAGULATION CONSULT NOTE - Follow Up Consult  Pharmacy Consult for Heparin Indication:  ischemic limb  No Known Allergies  Patient Measurements: Height: 6\' 2"  (188 cm) Weight: 82.5 kg (181 lb 14.1 oz) IBW/kg (Calculated) : 82.2 Heparin Dosing Weight: TBW (BMI 23)  Vital Signs: Temp: 98 F (36.7 C) (10/18 2329) Temp Source: Oral (10/18 2329) BP: 146/78 (10/18 2329) Pulse Rate: 66 (10/18 2329)  Labs: Recent Labs    06/19/21 0546 06/19/21 1536 06/20/21 0108 06/20/21 1009 06/21/21 0418 06/21/21 1152 06/22/21 0126  HGB 14.2  --  12.1*  --  11.7*  --  11.9*  HCT 40.2  --  37.1*  --  34.7*  --  34.7*  PLT 155  --  151  --  158  --  208  HEPARINUNFRC <0.10*   < > <0.10*   < > <0.10* 0.14* 0.32  CREATININE 1.00  --   --   --   --   --   --    < > = values in this interval not displayed.     Estimated Creatinine Clearance: 98.2 mL/min (by C-G formula based on SCr of 1 mg/dL).   Assessment: 55 YO male s/p R femoral artery endarterectomy/thrombectomy/ fasciotomy. [facility] had started patient on fixed-dose IV heparin at 500 units/h after surgery. Pharmacy asked to dose IV heparin without bolus.   Heparin level therapeutic (0.32) on gtt at 2000 units/hr. Hematuria resolving per RN now more clear urine with a few small clots.  Goal of Therapy:  Heparin level 0.3-0.7 units/ml Monitor platelets by anticoagulation protocol: Yes   Plan:  Continue heparin infusion at 2000 units/hr 6 hr confirmatory heparin level  Sherlon Handing, PharmD, BCPS Please see amion for complete clinical pharmacist phone list  06/22/2021 3:12 AM

## 2021-06-29 ENCOUNTER — Telehealth: Payer: Self-pay

## 2021-06-29 NOTE — Telephone Encounter (Signed)
Patient is s/p femoral endarterectomy and thrombectomy and fasciotomy on 10/15-10/16. He calls today c/o myriad of symptoms including swelling, inability to walk, inability to straighten his leg, a numb right foot, pain, and serosanguineous drainage. Discussed with PA and have added patient on to schedule tomorrow for a wound check no studies.

## 2021-06-30 ENCOUNTER — Encounter (HOSPITAL_COMMUNITY): Payer: Self-pay | Admitting: Emergency Medicine

## 2021-06-30 ENCOUNTER — Telehealth (HOSPITAL_COMMUNITY): Payer: Self-pay | Admitting: Pharmacist

## 2021-06-30 ENCOUNTER — Inpatient Hospital Stay (HOSPITAL_COMMUNITY)
Admission: EM | Admit: 2021-06-30 | Discharge: 2021-07-08 | DRG: 669 | Disposition: A | Discharge status: Home Health | Payer: Medicaid Other | Attending: Internal Medicine | Admitting: Internal Medicine

## 2021-06-30 ENCOUNTER — Ambulatory Visit (INDEPENDENT_AMBULATORY_CARE_PROVIDER_SITE_OTHER): Payer: Self-pay | Admitting: Physician Assistant

## 2021-06-30 ENCOUNTER — Emergency Department (HOSPITAL_COMMUNITY): Payer: Medicaid Other

## 2021-06-30 ENCOUNTER — Other Ambulatory Visit: Payer: Self-pay

## 2021-06-30 ENCOUNTER — Ambulatory Visit (HOSPITAL_COMMUNITY)
Admission: RE | Admit: 2021-06-30 | Discharge: 2021-06-30 | Disposition: A | Discharge status: Home / Self Care | Payer: Medicaid Other | Source: Ambulatory Visit | Attending: Vascular Surgery | Admitting: Vascular Surgery

## 2021-06-30 ENCOUNTER — Telehealth: Payer: Self-pay | Admitting: Student

## 2021-06-30 ENCOUNTER — Ambulatory Visit (INDEPENDENT_AMBULATORY_CARE_PROVIDER_SITE_OTHER)
Admission: RE | Admit: 2021-06-30 | Discharge: 2021-06-30 | Disposition: A | Discharge status: Home / Self Care | Payer: Medicaid Other | Source: Ambulatory Visit | Attending: Vascular Surgery | Admitting: Vascular Surgery

## 2021-06-30 VITALS — BP 135/90 | Temp 97.2°F | Resp 16 | Ht 74.0 in | Wt 178.0 lb

## 2021-06-30 DIAGNOSIS — D6832 Hemorrhagic disorder due to extrinsic circulating anticoagulants: Secondary | ICD-10-CM | POA: Diagnosis present

## 2021-06-30 DIAGNOSIS — E872 Acidosis, unspecified: Secondary | ICD-10-CM | POA: Diagnosis present

## 2021-06-30 DIAGNOSIS — F17210 Nicotine dependence, cigarettes, uncomplicated: Secondary | ICD-10-CM | POA: Diagnosis present

## 2021-06-30 DIAGNOSIS — E785 Hyperlipidemia, unspecified: Secondary | ICD-10-CM | POA: Diagnosis present

## 2021-06-30 DIAGNOSIS — N029 Recurrent and persistent hematuria with unspecified morphologic changes: Secondary | ICD-10-CM | POA: Diagnosis present

## 2021-06-30 DIAGNOSIS — T45515A Adverse effect of anticoagulants, initial encounter: Secondary | ICD-10-CM | POA: Diagnosis present

## 2021-06-30 DIAGNOSIS — N1330 Unspecified hydronephrosis: Secondary | ICD-10-CM | POA: Diagnosis present

## 2021-06-30 DIAGNOSIS — Z89422 Acquired absence of other left toe(s): Secondary | ICD-10-CM

## 2021-06-30 DIAGNOSIS — D62 Acute posthemorrhagic anemia: Secondary | ICD-10-CM | POA: Diagnosis present

## 2021-06-30 DIAGNOSIS — N179 Acute kidney failure, unspecified: Secondary | ICD-10-CM | POA: Diagnosis present

## 2021-06-30 DIAGNOSIS — E8770 Fluid overload, unspecified: Secondary | ICD-10-CM | POA: Diagnosis present

## 2021-06-30 DIAGNOSIS — C679 Malignant neoplasm of bladder, unspecified: Principal | ICD-10-CM | POA: Diagnosis present

## 2021-06-30 DIAGNOSIS — M7989 Other specified soft tissue disorders: Secondary | ICD-10-CM

## 2021-06-30 DIAGNOSIS — C772 Secondary and unspecified malignant neoplasm of intra-abdominal lymph nodes: Secondary | ICD-10-CM | POA: Diagnosis present

## 2021-06-30 DIAGNOSIS — Z79899 Other long term (current) drug therapy: Secondary | ICD-10-CM

## 2021-06-30 DIAGNOSIS — E871 Hypo-osmolality and hyponatremia: Secondary | ICD-10-CM | POA: Diagnosis present

## 2021-06-30 DIAGNOSIS — I871 Compression of vein: Secondary | ICD-10-CM | POA: Diagnosis present

## 2021-06-30 DIAGNOSIS — E869 Volume depletion, unspecified: Secondary | ICD-10-CM | POA: Diagnosis present

## 2021-06-30 DIAGNOSIS — Z20822 Contact with and (suspected) exposure to covid-19: Secondary | ICD-10-CM | POA: Diagnosis present

## 2021-06-30 DIAGNOSIS — I739 Peripheral vascular disease, unspecified: Secondary | ICD-10-CM | POA: Diagnosis present

## 2021-06-30 DIAGNOSIS — I10 Essential (primary) hypertension: Secondary | ICD-10-CM | POA: Diagnosis present

## 2021-06-30 DIAGNOSIS — L8990 Pressure ulcer of unspecified site, unspecified stage: Secondary | ICD-10-CM | POA: Diagnosis present

## 2021-06-30 DIAGNOSIS — Z8249 Family history of ischemic heart disease and other diseases of the circulatory system: Secondary | ICD-10-CM | POA: Diagnosis not present

## 2021-06-30 DIAGNOSIS — R7881 Bacteremia: Secondary | ICD-10-CM | POA: Diagnosis present

## 2021-06-30 DIAGNOSIS — K5900 Constipation, unspecified: Secondary | ICD-10-CM | POA: Diagnosis present

## 2021-06-30 DIAGNOSIS — Z95828 Presence of other vascular implants and grafts: Secondary | ICD-10-CM | POA: Diagnosis not present

## 2021-06-30 DIAGNOSIS — B954 Other streptococcus as the cause of diseases classified elsewhere: Secondary | ICD-10-CM | POA: Diagnosis present

## 2021-06-30 DIAGNOSIS — L89892 Pressure ulcer of other site, stage 2: Secondary | ICD-10-CM | POA: Diagnosis present

## 2021-06-30 DIAGNOSIS — Z7982 Long term (current) use of aspirin: Secondary | ICD-10-CM

## 2021-06-30 DIAGNOSIS — Z809 Family history of malignant neoplasm, unspecified: Secondary | ICD-10-CM

## 2021-06-30 DIAGNOSIS — N3289 Other specified disorders of bladder: Secondary | ICD-10-CM

## 2021-06-30 DIAGNOSIS — Z7901 Long term (current) use of anticoagulants: Secondary | ICD-10-CM

## 2021-06-30 DIAGNOSIS — E039 Hypothyroidism, unspecified: Secondary | ICD-10-CM | POA: Diagnosis present

## 2021-06-30 DIAGNOSIS — R319 Hematuria, unspecified: Secondary | ICD-10-CM | POA: Diagnosis present

## 2021-06-30 DIAGNOSIS — R600 Localized edema: Secondary | ICD-10-CM | POA: Diagnosis present

## 2021-06-30 LAB — CBC WITH DIFFERENTIAL/PLATELET
Abs Immature Granulocytes: 0 10*3/uL (ref 0.00–0.07)
Basophils Absolute: 0 10*3/uL (ref 0.0–0.1)
Basophils Relative: 0 %
Eosinophils Absolute: 0 10*3/uL (ref 0.0–0.5)
Eosinophils Relative: 0 %
HCT: 26.2 % — ABNORMAL LOW (ref 39.0–52.0)
Hemoglobin: 9 g/dL — ABNORMAL LOW (ref 13.0–17.0)
Lymphocytes Relative: 4 %
Lymphs Abs: 1 10*3/uL (ref 0.7–4.0)
MCH: 32.4 pg (ref 26.0–34.0)
MCHC: 34.4 g/dL (ref 30.0–36.0)
MCV: 94.2 fL (ref 80.0–100.0)
Monocytes Absolute: 1 10*3/uL (ref 0.1–1.0)
Monocytes Relative: 4 %
Neutro Abs: 24 10*3/uL — ABNORMAL HIGH (ref 1.7–7.7)
Neutrophils Relative %: 92 %
Platelets: 498 10*3/uL — ABNORMAL HIGH (ref 150–400)
RBC: 2.78 MIL/uL — ABNORMAL LOW (ref 4.22–5.81)
RDW: 13 % (ref 11.5–15.5)
WBC: 26.1 10*3/uL — ABNORMAL HIGH (ref 4.0–10.5)
nRBC: 0 % (ref 0.0–0.2)
nRBC: 0 /100 WBC

## 2021-06-30 LAB — COMPREHENSIVE METABOLIC PANEL
ALT: 13 U/L (ref 0–44)
AST: 21 U/L (ref 15–41)
Albumin: 2.4 g/dL — ABNORMAL LOW (ref 3.5–5.0)
Alkaline Phosphatase: 85 U/L (ref 38–126)
Anion gap: 13 (ref 5–15)
BUN: 18 mg/dL (ref 6–20)
CO2: 21 mmol/L — ABNORMAL LOW (ref 22–32)
Calcium: 8.5 mg/dL — ABNORMAL LOW (ref 8.9–10.3)
Chloride: 81 mmol/L — ABNORMAL LOW (ref 98–111)
Creatinine, Ser: 1.56 mg/dL — ABNORMAL HIGH (ref 0.61–1.24)
GFR, Estimated: 52 mL/min — ABNORMAL LOW (ref >60)
Glucose, Bld: 91 mg/dL (ref 70–99)
Potassium: 5.1 mmol/L (ref 3.5–5.1)
Sodium: 115 mmol/L — CL (ref 135–145)
Total Bilirubin: 1.3 mg/dL — ABNORMAL HIGH (ref 0.3–1.2)
Total Protein: 7.2 g/dL (ref 6.5–8.1)

## 2021-06-30 LAB — LACTIC ACID, PLASMA: Lactic Acid, Venous: 2.9 mmol/L (ref 0.5–1.9)

## 2021-06-30 MED ORDER — CEPHALEXIN 500 MG PO CAPS: 500.0000 mg | ORAL_CAPSULE | Freq: Two times a day (BID) | ORAL | 0 refills | Status: DC

## 2021-06-30 MED ORDER — OXYCODONE HCL 5 MG PO TABS
5.0000 mg | ORAL_TABLET | Freq: Once | ORAL | Status: AC
  Administered 2021-07-01: 5 mg via ORAL
  Filled 2021-06-30: qty 1

## 2021-06-30 MED ORDER — ACETAMINOPHEN 325 MG PO TABS
650.0000 mg | ORAL_TABLET | Freq: Once | ORAL | Status: AC
  Administered 2021-07-01: 650 mg via ORAL
  Filled 2021-06-30: qty 2

## 2021-06-30 MED ORDER — SODIUM CHLORIDE 0.9 % IV BOLUS
1000.0000 mL | Freq: Once | INTRAVENOUS | Status: AC
  Administered 2021-07-01: 1000 mL via INTRAVENOUS

## 2021-06-30 MED ORDER — HYDROCODONE-ACETAMINOPHEN 5-325 MG PO TABS: 1.0000 | ORAL_TABLET | Freq: Four times a day (QID) | ORAL | 0 refills | Status: DC | PRN

## 2021-06-30 NOTE — Telephone Encounter (Signed)
Patient evaluate in urology clinic.  Patient presented due to concern for metastatic bladder cancer on recent CT imaging. Today patient have clots and difficulty urinating. Also endorsing worsening dizziness, inability to walk, not tolerating PO. In clinic a foley catheter was placed. Attempted to place a large bore hematuria catheter but due to his narrow urethral meatus, I was unable to pass anything larger than an 18Fr catheter. This was irrigated with a small amount of clot return. Draining urine was thin amber.   Recommended that patient present to the ED for further evaluation and possible admission to medicine service.   Urology will follow while admitted to reassess hematuria - he will ultimately need definitive tissue diagnosis. Will need TURBT. He will need to be off of blood thinners for this - will contact vascular surgery team to discuss feasibility of this given his recent revascularization procedures.

## 2021-06-30 NOTE — ED Provider Notes (Signed)
[facility] Emergency Department Provider Note MRN:  [id number]  Arrival date & time: 07/01/21     Chief Complaint   Hematuria   History of Present Illness   Arthur Mcmahon is a 55 y.o. year-old male with a history of hypertension, peripheral vascular disease presenting to the ED with chief complaint of hematuria.  Sent from the urology office today for persistent hematuria.  Concerned that he has a bladder tumor that is persistently bleeding.  This is complicated by being on blood thinners.  Patient denies any new symptoms today.  Feels generally tired.  Has some continued soreness of the right leg from the recent surgery.  Review of Systems  Positive for fatigue, hematuria.  Patient's Health History    Past Medical History:  Diagnosis Date   Anemia    ETOH abuse    Hypertension    Peripheral vascular disease ([place])    Pneumonia     Past Surgical History:  Procedure Laterality Date   ABDOMINAL AORTOGRAM W/LOWER EXTREMITY Bilateral 03/31/2021   Procedure: ABDOMINAL AORTOGRAM W/LOWER EXTREMITY;  Surgeon: [name], MD;  Location: [place] [facility];  Service: Cardiovascular;  Laterality: Bilateral;   AMPUTATION Left 04/29/2021   Procedure: Left second Partial TOE AMPUTATION;  Surgeon: [name], MD;  Location: [place];  Service: Vascular;  Laterality: Left;   ENDARTERECTOMY FEMORAL Left 04/11/2021   Procedure: LEFT FEMORAL ENDARTERECTOMY;  Surgeon: [name], MD;  Location: [place];  Service: Vascular;  Laterality: Left;   ENDARTERECTOMY FEMORAL Right 06/18/2021   Procedure: ENDARTERECTOMY RIGHT FEMORAL ARTERY;  Surgeon: [name], MD;  Location: [place];  Service: Vascular;  Laterality: Right;   FASCIOTOMY Right 06/18/2021   Procedure: RIGHT LOWER EXTREMITY FASCIOTOMY;  Surgeon: [name], MD;  Location: [place];  Service: Vascular;  Laterality: Right;   FRACTURE SURGERY Right    f[place]   INSERTION OF ILIAC  STENT Left 04/11/2021   Procedure: INSERTION OF LEFT ABOVE KNEE POPLITEAL STENT;  Surgeon: [name], MD;  Location: [place];  Service: Vascular;  Laterality: Left;   INTRAOPERATIVE ARTERIOGRAM Left 04/11/2021   Procedure: INTRA OPERATIVE ARTERIOGRAM;  Surgeon: [name], MD;  Location: [place];  Service: Vascular;  Laterality: Left;   PATCH ANGIOPLASTY Left 04/11/2021   Procedure: PATCH ANGIOPLASTY LEFT COMMON FEMORAL ARTERY, PROFUNDOPLASTY;  Surgeon: [name], MD;  Location: [place];  Service: Vascular;  Laterality: Left;   PATCH ANGIOPLASTY Right 06/18/2021   Procedure: [place];  Surgeon: [name], MD;  Location: [place];  Service: Vascular;  Laterality: Right;   THROMBECTOMY FEMORAL ARTERY Right 06/18/2021   Procedure: THROMBECTOMY RIGHT LOWER EXTREMITY;  Surgeon: [name], MD;  Location: [facility];  Service: Vascular;  Laterality: Right;   THROMBECTOMY FEMORAL ARTERY Right 06/19/2021   Procedure: THROMBECTOMY RIGHT LOWER EXTREMITY VIA POPITEAL APPROACH;  Surgeon: [name], MD;  Location: [facility] OR;  Service: Vascular;  Laterality: Right;   TONSILLECTOMY      Family History  Problem Relation Age of Onset   Cancer Mother    Heart failure Mother    Cancer Father     Social History   Socioeconomic History   Marital status: Divorced    Spouse name: Not on file   Number of children: Not on file   Years of education: Not on file   Highest education level: Not on file  Occupational History   Not  on file  Tobacco Use   Smoking status: Every Day    Packs/day: 0.50    Years: 25.00    Pack years: 12.50    Types: Cigarettes   Smokeless tobacco: Never  Vaping Use   Vaping Use: Never used  Substance and Sexual Activity   Alcohol use: Not Currently    Comment: daily   Drug use: Yes    Types: Marijuana    Comment: 2-3 a week   Sexual activity: Not on file  Other Topics Concern   Not on file   Social History Narrative   Not on file   Social Determinants of Health   Financial Resource Strain: Not on file  Food Insecurity: Not on file  Transportation Needs: Not on file  Physical Activity: Not on file  Stress: Not on file  Social Connections: Not on file  Intimate Partner Violence: Not on file     Physical Exam   Vitals:   07/01/21 0600 07/01/21 0707  BP: 109/67 111/70  Pulse: 80 79  Resp: 16 16  Temp:    SpO2: 100% 100%    CONSTITUTIONAL: Chronically ill-appearing, NAD NEURO:  Alert and oriented x 3, no focal deficits EYES:  eyes equal and reactive ENT/NECK:  no LAD, no JVD CARDIO: Regular rate, well-perfused, normal S1 and S2 PULM:  CTAB no wheezing or rhonchi GI/GU:  normal bowel sounds, non-distended, non-tender MSK/SPINE:  No gross deformities, pitting edema to bilateral lower extremities, right leg with dressing in place, clean dry and intact SKIN:  no rash, atraumatic PSYCH:  Appropriate speech and behavior  *Additional and/or pertinent findings included in MDM below  Diagnostic and Interventional Summary    EKG Interpretation  Date/Time:    Ventricular Rate:    PR Interval:    QRS Duration:   QT Interval:    QTC Calculation:   R Axis:     Text Interpretation:         Labs Reviewed  CBC WITH DIFFERENTIAL/PLATELET - Abnormal; Notable for the following components:      Result Value   WBC 26.1 (*)    RBC 2.78 (*)    Hemoglobin 9.0 (*)    HCT 26.2 (*)    Platelets 498 (*)    Neutro Abs 24.0 (*)    All other components within normal limits  COMPREHENSIVE METABOLIC PANEL - Abnormal; Notable for the following components:   Sodium 115 (*)    Chloride 81 (*)    CO2 21 (*)    Creatinine, Ser 1.56 (*)    Calcium 8.5 (*)    Albumin 2.4 (*)    Total Bilirubin 1.3 (*)    GFR, Estimated 52 (*)    All other components within normal limits  URINALYSIS, ROUTINE W REFLEX MICROSCOPIC - Abnormal; Notable for the following components:   Color,  Urine RED (*)    APPearance TURBID (*)    Glucose, UA   (*)    Value: TEST NOT REPORTED DUE TO COLOR INTERFERENCE OF URINE PIGMENT   Hgb urine dipstick   (*)    Value: TEST NOT REPORTED DUE TO COLOR INTERFERENCE OF URINE PIGMENT   Bilirubin Urine   (*)    Value: TEST NOT REPORTED DUE TO COLOR INTERFERENCE OF URINE PIGMENT   Ketones, ur   (*)    Value: TEST NOT REPORTED DUE TO COLOR INTERFERENCE OF URINE PIGMENT   Protein, ur   (*)    Value: TEST NOT REPORTED DUE TO COLOR  INTERFERENCE OF URINE PIGMENT   Nitrite   (*)    Value: TEST NOT REPORTED DUE TO COLOR INTERFERENCE OF URINE PIGMENT   Leukocytes,Ua   (*)    Value: TEST NOT REPORTED DUE TO COLOR INTERFERENCE OF URINE PIGMENT   RBC / HPF >50 (*)    WBC, UA >50 (*)    Bacteria, UA MANY (*)    Non Squamous Epithelial 11-20 (*)    All other components within normal limits  LACTIC ACID, PLASMA - Abnormal; Notable for the following components:   Lactic Acid, Venous 2.9 (*)    All other components within normal limits  OSMOLALITY - Abnormal; Notable for the following components:   Osmolality 243 (*)    All other components within normal limits  CBC - Abnormal; Notable for the following components:   WBC 16.3 (*)    RBC 2.27 (*)    Hemoglobin 7.6 (*)    HCT 21.3 (*)    All other components within normal limits  TSH - Abnormal; Notable for the following components:   TSH 13.445 (*)    All other components within normal limits  CULTURE, BLOOD (ROUTINE X 2)  RESP PANEL BY RT-PCR (FLU A&B, COVID) ARPGX2  CULTURE, BLOOD (ROUTINE X 2)  BLOOD CULTURE ID PANEL (REFLEXED) - BCID2  SODIUM, URINE, RANDOM  CORTISOL  LACTIC ACID, PLASMA  HIV ANTIBODY (ROUTINE TESTING W REFLEX)  BASIC METABOLIC PANEL  BASIC METABOLIC PANEL  BASIC METABOLIC PANEL  BASIC METABOLIC PANEL  BASIC METABOLIC PANEL  CBC  CBC  TYPE AND SCREEN    DG Chest 2 View  Final Result      Medications  acetaminophen (TYLENOL) tablet 650 mg (has no administration  in time range)    Or  acetaminophen (TYLENOL) suppository 650 mg (has no administration in time range)  cefTRIAXone (ROCEPHIN) 1 g in sodium chloride 0.9 % 100 mL IVPB (0 g Intravenous Stopped 07/01/21 0644)  acetaminophen (TYLENOL) tablet 650 mg (650 mg Oral Given 07/01/21 0002)  oxyCODONE (Oxy IR/ROXICODONE) immediate release tablet 5 mg (5 mg Oral Given 07/01/21 0002)  sodium chloride 0.9 % bolus 1,000 mL (0 mLs Intravenous Stopped 07/01/21 0105)     Procedures  /  Critical Care .Critical Care Performed by: Maudie Flakes, MD Authorized by: Maudie Flakes, MD   Critical care provider statement:    Critical care time (minutes):  35   Critical care time was exclusive of:  Separately billable procedures and treating other patients   Critical care was necessary to treat or prevent imminent or life-threatening deterioration of the following conditions:  Metabolic crisis   Critical care was time spent personally by me on the following activities:  Ordering and performing treatments and interventions, ordering and review of laboratory studies, ordering and review of radiographic studies, re-evaluation of patient's condition, review of old charts, examination of patient and discussions with consultants  ED Course and Medical Decision Making  I have reviewed the triage vital signs, the nursing notes, and pertinent available records from the EMR.  Listed above are laboratory and imaging tests that I personally ordered, reviewed, and interpreted and then considered in my medical decision making (see below for details).  Hematuria in the setting of anticoagulation and bladder tumor.  Also only 9 days removed from revascularization of the right leg with vascular surgery.  Patient also has profound hyponatremia found on labs today.  Urology team is following and will discuss anticoagulation plan with vascular surgery, plan  is to admit to medicine, ideally at [facility] given a planned procedure that  will occur soon.     Admitted to hospitalist service for further care.  Barth Kirks. Sedonia Small, MD [place] [email or web address]  Final Clinical Impressions(s) / ED Diagnoses     ICD-10-CM   1. Hematuria, unspecified type  R31.9     2. Hyponatremia  E87.1       ED Discharge Orders     None        Discharge Instructions Discussed with and Provided to Patient:   Discharge Instructions   None       Maudie Flakes, MD 07/01/21 [phone]

## 2021-06-30 NOTE — Progress Notes (Signed)
POST OPERATIVE OFFICE NOTE    CC:  F/u for surgery  HPI:  This is a 55 y.o. male who is s/p  1.  Right common femoral endarterectomy including profundoplasty with bovine pericardial patch angioplasty 2.  Right lower extremity thrombectomy from femoral approach 3.  Right lower extremity 4 compartment fasciotomy    on 06/22/2021 by Dr. Carlis Abbott.  Later that evening, Signal lost on the floor now with a mottled foot.  Will return to the operating room for right lower extremity thrombectomy possible bypass.   She was taken back to the operating room and underwent right lower extremity thrombectomy via popliteal approach.     He had a brisk PT doppler signal and foot was warm.  He was continued on heparin gtt.   During his hospital course, his foot continued to improve and he was having increasing ambulation.  PT recommended RW.   Prior to discharge, his heparin will be converted over to DOAC and plavix discontinued.    CT in the ED was concerning for primary bladder malignancy with nodal metastatic disease and bulky retroperitoneal lymph nodes.  I have discussed with urology and Dr. Diona Fanti will arrange a urgent referral to their office.  His appt is this afternoon at 3pm. Pt returns today for follow up.    Pt was added on today's schedule.  He states for the past 3-4 days, he has had some numbness and pain in the right foot.  He has had increased drainage from the medial and lateral fasciotomy sites as well as his right groin.  He does have significant swelling in the right leg.  He says he elevates when he can.  He says he has been taking his Eliquis.   Pt also has hx of: 1.  Left common femoral endarterectomy with profundoplasty and bovine pericardial patch angioplasty 2.  Left lower extremity arteriogram with selection of third order branches 3.  Left above-knee popliteal angioplasty with stent placement (stented with 7 mm x 40 mm self-expanding Innova postdilated with a 6 mm x 40 mm  Mustang) On 04/11/2021 by Dr. Carlis Abbott and subsequent left 3rd toe amputation on 04/29/2021 by Dr. Carlis Abbott.   No Known Allergies  Current Outpatient Medications  Medication Sig Dispense Refill   acetaminophen (TYLENOL) 500 MG tablet Take 500 mg by mouth every 6 (six) hours as needed for mild pain, fever or headache.     apixaban (ELIQUIS) 5 MG TABS tablet Take 1 tablet (5 mg total) by mouth 2 (two) times daily. 60 tablet 11   APIXABAN (ELIQUIS) VTE STARTER PACK (10MG  AND 5MG ) Take as directed on package: start with two-5mg  tablets twice daily for 7 days. On day 8, switch to one-5mg  tablet twice daily. 74 each 0   aspirin EC 81 MG tablet Take 1 tablet (81 mg total) by mouth daily. Swallow whole. 365 tablet 0   atorvastatin (LIPITOR) 40 MG tablet Take 1 tablet (40 mg total) by mouth daily. 30 tablet 11   HYDROcodone-acetaminophen (NORCO) 5-325 MG tablet Take 1 tablet by mouth every 6 (six) hours as needed for moderate pain. 30 tablet 0   Multiple Vitamin (MULTIVITAMIN WITH MINERALS) TABS tablet Take 1 tablet by mouth daily.     No current facility-administered medications for this visit.     ROS:  See HPI  Physical Exam:  Today's Vitals   06/30/21 1111 06/30/21 1113  BP: 135/90   Resp: 16   Temp: (!) 97.2 F (36.2 C)   TempSrc: Temporal  Weight: 178 lb (80.7 kg)   Height: 6\' 2"  (1.88 m)   PainSc: 8  8   PainLoc: Leg    Body mass index is 22.85 kg/m.  Incisions:  Right groin has healed nicely with a very small area on the medial aspect of the incision with some serous drainage and fibrinous exudate.  There is no erythema.  The medial and lateral fasciotomy sites have staples in tact with serous drainage from both.         Extremities:  unable to hear PT doppler signal.  There is significant swelling of the RLE. He does have a brisk doppler signal right femoral.  His right foot is cold to touch and is cooler than the left foot.  There is no pulsatile mass in the right groin.     Assessment/Plan:  This is a 55 y.o. male who is s/p: 1.  Right common femoral endarterectomy including profundoplasty with bovine pericardial patch angioplasty 2.  Right lower extremity thrombectomy from femoral approach 3.  Right lower extremity 4 compartment fasciotomy    on 06/22/2021 by Dr. Carlis Abbott.  Later that evening, Signal lost on the floor now with a mottled foot.  Will return to the operating room for right lower extremity thrombectomy possible bypass.  He was taken back to the operating room and underwent right lower extremity thrombectomy via popliteal approach.    -unable to hear right PT doppler signal-he is getting a RLE arterial duplex and ABI today.   Duplex today reveals ABI of 0.7 via PTA and multiphasic waveforms down to the knee.  Given the swelling, DVT study was also obtained, which revealed there is no evidence of DVT. -discussed with Dr. Carlis Abbott and will wrap his legs with ace wrap and have him elevate his legs to help with the swelling.  Stressed this to the pt as well.   He will continue his Eliquis.  He most likely has a bladder malignancy, which increases his risk of hypercoagulable state.  He has appt with [facility] this afternoon for evaluation.   -given the drainage, I have sent an Rx for Keflex 500mg  bid #20 no refill  -an Rx for Norco 5/325 one po q6h prn pain #20 no refill also sent. -discussed smoking and he has cut back.  Encouraged him to continue to try to quit.  -pt will f/u on Tuesday on PA clinic and Dr. Carlis Abbott will see pt at that time.  Leontine Locket, [facility] [phone]   Clinic MD:  Scot Dock

## 2021-06-30 NOTE — Telephone Encounter (Signed)
LVM

## 2021-06-30 NOTE — ED Provider Notes (Signed)
Emergency Medicine Provider Triage Evaluation Note  Arthur Mcmahon , a 55 y.o. male  was evaluated in triage.  Pt complains of dysuria.  Patient with recent revascularization right lower extremity followed by vascular surgery.  Negative ultrasound, duplex ABI 0.7.  Was concerned about some swelling however they recommended patient elevate his leg.  They did have some concern further note about some drainage and started him on Keflex.  Was also seen by urology clinic due to urinary retention and hematuria.  They are able to place a Foley catheter however there is some concern about metastatic bladder cancer.  They recommended patient be admitted for further work-up of this.  Patient states his only complaint is pain to his right lower extremity.  He does feel generally weak.  No fevers had some chills  Review of Systems  Positive: Hematuria, fever Negative:   Physical Exam  BP 119/82 (BP Location: Right Arm)   Pulse (!) 115   Temp 100.3 F (37.9 C) (Oral)   Resp 18   SpO2 100%  Gen:   Awake, no distress   Resp:  Normal effort  MSK:   Moves extremities without difficulty, recent surgical wounds to RLE Other:    Medical Decision Making  Medically screening exam initiated at 5:51 PM.  Appropriate orders placed.  KITAI PURDOM was informed that the remainder of the evaluation will be completed by another provider, this initial triage assessment does not replace that evaluation, and the importance of remaining in the ED until their evaluation is complete.  Recent PAD bypass surgery, also seen by urology for hematuria.  On arrival he has temp 100.3 and is tachycardic.  We will get sepsis work-up.  Needs room in back   Lanesha Azzaro A, PA-C 06/30/21 1753    Malvin Johns, MD 06/30/21 3299

## 2021-06-30 NOTE — Consult Note (Addendum)
Urology Consult   Physician requesting consult: Henderly PA-C  Reason for consult: Gross hematuria   History of Present Illness: Arthur Mcmahon is a 55 y.o. with history of hypertension, tobacco abuse and peripheral vascular disease s/p recent right femoral endarterectomy and fasciotomy for critical limb ischemia who was found to have bladder mass and bulky lymphadenopathy on CT imaging. Patient reports several months of intermittent hematuria.   Patient was seen in urology clinic today for urgent outpatient referral given CT findings on imaging obtained during recent hospitalization and gross hematuria. On outpatient evaluation he was short of breath, lethargic, unable to ambulate and ill appearing so was sent to the ED for further evaluation. A foley catheter was placed in clinic due to reports of clots and difficulty voiding. Unable to place large bore hematuria catheter due to narrow caliber meatus.  Past Medical History:  Diagnosis Date   Anemia    ETOH abuse    Hypertension    Peripheral vascular disease ([place])    Pneumonia     Past Surgical History:  Procedure Laterality Date   ABDOMINAL AORTOGRAM W/LOWER EXTREMITY Bilateral 03/31/2021   Procedure: ABDOMINAL AORTOGRAM W/LOWER EXTREMITY;  Surgeon: [name], MD;  Location: [facility];  Service: Cardiovascular;  Laterality: Bilateral;   AMPUTATION Left 04/29/2021   Procedure: Left second Partial TOE AMPUTATION;  Surgeon: [name], MD;  Location: [place];  Service: Vascular;  Laterality: Left;   ENDARTERECTOMY FEMORAL Left 04/11/2021   Procedure: LEFT FEMORAL ENDARTERECTOMY;  Surgeon: [name], MD;  Location: [place];  Service: Vascular;  Laterality: Left;   ENDARTERECTOMY FEMORAL Right 06/18/2021   Procedure: ENDARTERECTOMY RIGHT FEMORAL ARTERY;  Surgeon: [name], MD;  Location: [place];  Service: Vascular;  Laterality: Right;   FASCIOTOMY Right 06/18/2021   Procedure: RIGHT LOWER  EXTREMITY FASCIOTOMY;  Surgeon: [name], MD;  Location: [place];  Service: Vascular;  Laterality: Right;   FRACTURE SURGERY Right    femur   INSERTION OF ILIAC STENT Left 04/11/2021   Procedure: INSERTION OF LEFT ABOVE KNEE POPLITEAL STENT;  Surgeon: [name], MD;  Location: [place];  Service: Vascular;  Laterality: Left;   INTRAOPERATIVE ARTERIOGRAM Left 04/11/2021   Procedure: INTRA OPERATIVE ARTERIOGRAM;  Surgeon: [name], MD;  Location: [place];  Service: Vascular;  Laterality: Left;   PATCH ANGIOPLASTY Left 04/11/2021   Procedure: PATCH ANGIOPLASTY LEFT COMMON FEMORAL ARTERY, PROFUNDOPLASTY;  Surgeon: [name], MD;  Location: [place];  Service: Vascular;  Laterality: Left;   PATCH ANGIOPLASTY Right 06/18/2021   Procedure: PATCH ANGIOPLASTY USING Rueben Bash BIOLOGIC PATCH;  Surgeon: [name], MD;  Location: [place];  Service: Vascular;  Laterality: Right;   THROMBECTOMY FEMORAL ARTERY Right 06/18/2021   Procedure: THROMBECTOMY RIGHT LOWER EXTREMITY;  Surgeon: [name], MD;  Location: [place];  Service: Vascular;  Laterality: Right;   THROMBECTOMY FEMORAL ARTERY Right 06/19/2021   Procedure: THROMBECTOMY RIGHT LOWER EXTREMITY VIA POPITEAL APPROACH;  Surgeon: [name], MD;  Location: [place];  Service: Vascular;  Laterality: Right;   TONSILLECTOMY      Current Hospital Medications:  Home Meds:  No current facility-administered medications on file prior to encounter.   Current Outpatient Medications on File Prior to Encounter  Medication Sig Dispense Refill   acetaminophen (TYLENOL) 500 MG tablet Take 500 mg by mouth every 6 (six) hours as needed for mild pain, fever or headache.     apixaban (ELIQUIS)  5 MG TABS tablet Take 1 tablet (5 mg total) by mouth 2 (two) times daily. 60 tablet 11   APIXABAN (ELIQUIS) VTE STARTER PACK (10MG  AND 5MG ) Take as directed on package: start with two-5mg  tablets twice daily for 7 days. On day  8, switch to one-5mg  tablet twice daily. 74 each 0   aspirin EC 81 MG tablet Take 1 tablet (81 mg total) by mouth daily. Swallow whole. 365 tablet 0   atorvastatin (LIPITOR) 40 MG tablet Take 1 tablet (40 mg total) by mouth daily. 30 tablet 11   cephALEXin (KEFLEX) 500 MG capsule Take 1 capsule (500 mg total) by mouth 2 (two) times daily. 20 capsule 0   HYDROcodone-acetaminophen (NORCO) 5-325 MG tablet Take 1 tablet by mouth every 6 (six) hours as needed for moderate pain. 20 tablet 0   Multiple Vitamin (MULTIVITAMIN WITH MINERALS) TABS tablet Take 1 tablet by mouth daily.       Scheduled Meds:  acetaminophen  650 mg Oral Once   Continuous Infusions: PRN Meds:.  Allergies: No Known Allergies  Family History  Problem Relation Age of Onset   Cancer Mother    Heart failure Mother    Cancer Father     Social History:  reports that he has been smoking cigarettes. He has a 12.50 pack-year smoking history. He has never used smokeless tobacco. He reports that he does not currently use alcohol. He reports current drug use. Drug: Marijuana.  ROS: A complete review of systems was performed.  All systems are negative except for pertinent findings as noted.  Physical Exam:  Vital signs in last 24 hours: Temp:  [97.2 F (36.2 C)-100.3 F (37.9 C)] 98.6 F (37 C) (10/27 2018) Pulse Rate:  [99-115] 99 (10/27 2217) Resp:  [16-20] 16 (10/27 2217) BP: (116-135)/(76-90) 130/76 (10/27 2217) SpO2:  [98 %-100 %] 100 % (10/27 2217) Weight:  [80.7 kg] 80.7 kg (10/27 1111) Constitutional:  Alert and oriented, No acute distress. Ill appearing, appears older than stated age.  Cardiovascular: Regular rate and rhythm, No JVD Respiratory: Normal respiratory effort, Lungs clear bilaterally GI: Abdomen is soft, nontender, nondistended, no abdominal masses GU: 18Fr foley catheter in place draining thin merlot colored urine, small clots hand irrigated Extremity: RLE with compressive wrap, thin drainage  from incision site, bilateral LE swelling Neurologic: Grossly intact, no focal deficits Psychiatric: Normal mood and affect  Laboratory Data:  Recent Labs    06/30/21 1758  WBC 26.1*  HGB 9.0*  HCT 26.2*  PLT 498*    Recent Labs    06/30/21 1758  NA 115*  K 5.1  CL 81*  GLUCOSE 91  BUN 18  CALCIUM 8.5*  CREATININE 1.56*     Results for orders placed or performed during the hospital encounter of 06/30/21 (from the past 24 hour(s))  CBC with Differential     Status: Abnormal   Collection Time: 06/30/21  5:58 PM  Result Value Ref Range   WBC 26.1 (H) 4.0 - 10.5 K/uL   RBC 2.78 (L) 4.22 - 5.81 MIL/uL   Hemoglobin 9.0 (L) 13.0 - 17.0 g/dL   HCT 26.2 (L) 39.0 - 52.0 %   MCV 94.2 80.0 - 100.0 fL   MCH 32.4 26.0 - 34.0 pg   MCHC 34.4 30.0 - 36.0 g/dL   RDW 13.0 11.5 - 15.5 %   Platelets 498 (H) 150 - 400 K/uL   nRBC 0.0 0.0 - 0.2 %   Neutrophils Relative % 92 %  Neutro Abs 24.0 (H) 1.7 - 7.7 K/uL   Lymphocytes Relative 4 %   Lymphs Abs 1.0 0.7 - 4.0 K/uL   Monocytes Relative 4 %   Monocytes Absolute 1.0 0.1 - 1.0 K/uL   Eosinophils Relative 0 %   Eosinophils Absolute 0.0 0.0 - 0.5 K/uL   Basophils Relative 0 %   Basophils Absolute 0.0 0.0 - 0.1 K/uL   nRBC 0 0 /100 WBC   Abs Immature Granulocytes 0.00 0.00 - 0.07 K/uL   Polychromasia PRESENT   Comprehensive metabolic panel     Status: Abnormal   Collection Time: 06/30/21  5:58 PM  Result Value Ref Range   Sodium 115 (LL) 135 - 145 mmol/L   Potassium 5.1 3.5 - 5.1 mmol/L   Chloride 81 (L) 98 - 111 mmol/L   CO2 21 (L) 22 - 32 mmol/L   Glucose, Bld 91 70 - 99 mg/dL   BUN 18 6 - 20 mg/dL   Creatinine, Ser 1.56 (H) 0.61 - 1.24 mg/dL   Calcium 8.5 (L) 8.9 - 10.3 mg/dL   Total Protein 7.2 6.5 - 8.1 g/dL   Albumin 2.4 (L) 3.5 - 5.0 g/dL   AST 21 15 - 41 U/L   ALT 13 0 - 44 U/L   Alkaline Phosphatase 85 38 - 126 U/L   Total Bilirubin 1.3 (H) 0.3 - 1.2 mg/dL   GFR, Estimated 52 (L) >60 mL/min   Anion gap 13 5 -  15  Lactic acid, plasma     Status: Abnormal   Collection Time: 06/30/21  5:58 PM  Result Value Ref Range   Lactic Acid, Venous 2.9 (HH) 0.5 - 1.9 mmol/L   No results found for this or any previous visit (from the past 240 hour(s)).  Renal Function: Recent Labs    06/30/21 1758  CREATININE 1.56*   Estimated Creatinine Clearance: 61.8 mL/min (A) (by C-G formula based on SCr of 1.56 mg/dL (H)).  Radiologic Imaging:  I independently reviewed the above imaging studies.  Impression/Recommendation CLINICAL DATA:  Right lower extremity, cold right leg   EXAM: CT ANGIOGRAPHY OF ABDOMINAL AORTA WITH ILIOFEMORAL RUNOFF   TECHNIQUE: Multidetector CT imaging of the abdomen, pelvis and lower extremities was performed using the standard protocol during bolus administration of intravenous contrast. Multiplanar CT image reconstructions and MIPs were obtained to evaluate the vascular anatomy.   CONTRAST:  159mL OMNIPAQUE IOHEXOL 350 MG/ML SOLN   COMPARISON:  None.   FINDINGS: VASCULAR   Aorta: Normal contour and caliber of the abdominal aorta. Moderate mixed abdominal aortic atherosclerosis.   Celiac: Patent without evidence of aneurysm, dissection, vasculitis or significant stenosis.   SMA: Patent without evidence of aneurysm, dissection, vasculitis or significant stenosis.   Renals: Patent. Duplicated left renal arteries with a small accessory inferior pole left renal artery. Solitary right renal artery, however with a very proximal branching inferior pole arterial.   IMA: Patent without evidence of aneurysm, dissection, vasculitis or significant stenosis.   RIGHT Lower Extremity   Inflow: Common, internal and external iliac arteries are patent without evidence of aneurysm, dissection, vasculitis or significant stenosis.   Outflow: There is short segment mixed atherosclerotic occlusion of the right common femoral artery (series 13, image 221). The  proximal superficial femoral and profundal arteries are reconstituted, however the superficial femoral artery is occluded along its length from the midportion of the vessel, as is the popliteal artery.   Runoff: No opacification of the right lower extremity runoff vessels. Scattered atherosclerotic  calcifications.   LEFT Lower Extremity   Inflow: Common, internal and external iliac arteries are patent without evidence of aneurysm, dissection, vasculitis or significant stenosis.   Outflow: Common, superficial and profunda femoral arteries and the popliteal artery are patent without evidence of aneurysm, dissection, vasculitis or significant stenosis. Moderate mixed calcific atherosclerosis. Patent stent of the left popliteal artery above the knee.   Runoff: There is one-vessel runoff to the left ankle via the posterior tibial artery. The anterior tibial and peroneal arteries are extinguished above the ankle.   Veins: Unremarkable in appearance on this limited arterial phase examination.   Review of the MIP images confirms the above findings.   NON-VASCULAR   Lower chest: No acute abnormality.   Hepatobiliary: No focal liver abnormality is seen. No gallstones, gallbladder wall thickening, or biliary dilatation.   Pancreas: Unremarkable. No pancreatic ductal dilatation or surrounding inflammatory changes.   Spleen: Normal in size without focal abnormality.   Adrenals/Urinary Tract: Adrenal glands are unremarkable. Moderate right hydronephrosis and hydroureter with a delayed right nephrogram, consistent with obstruction of the right ureterovesicular junction by mass. Severe, right eccentric and posterior wall thickening of the relatively decompressed urinary bladder, thickening up to 3.1 cm (series 13, image 207).   Stomach/Bowel: Stomach is within normal limits. Appendix appears normal. No evidence of bowel wall thickening, distention, or inflammatory changes.    Lymphatic: Numerous bulky retroperitoneal, bilateral iliac, and pelvic sidewall lymph nodes, largest retroperitoneal nodes or conglomerates measuring up to 3.8 x 2.6 cm (series 13, image 109), largest right iliac nodes measuring up to 3.8 x 3.3 cm (series 13, image 207).   Reproductive: Prostate is unremarkable.   Other: No abdominal wall hernia or abnormality. No abdominopelvic ascites.   Musculoskeletal: No acute or significant osseous findings.   IMPRESSION: 1. There is short segment mixed atherosclerotic occlusion of the right common femoral artery. 2. The proximal right superficial femoral and profundal arteries are reconstituted, however the right superficial femoral artery is occluded along its length from the midportion of the vessel, as is the popliteal artery. 3. No opacification of the right lower extremity runoff vessels secondary to proximal occlusion. 4. Patent left popliteal artery stent. 5. There is one-vessel runoff to the left ankle via the posterior tibial artery. 6. Severe, right eccentric and posterior wall thickening of the relatively decompressed urinary bladder, thickening up to 3.1 cm. 7. Numerous bulky retroperitoneal, iliac, and pelvic sidewall lymph nodes. 8. Findings are consistent with primary bladder malignancy and nodal metastatic disease. 9. Moderate right hydronephrosis and hydroureter with a delayed right nephrogram, consistent with obstruction of the right ureterovesicular junction by mass.   Impression/Recommendation 55 y/o male with history of HTN, hypertension, tobacco abuse and peripheral vascular disease with recent critical limb ischemia (s/p RLE endarterectomy 10/16) with likely new metastatic bladder cancer. CT imaging obtained during prior admission with large bladder mass and bulky lymphadenopathy. Gross hematuria improved after foley placement and irrigation of clots. From urologic perspective, patient ultimately needs tissue  diagnosis to confirm bladder malignancy. Initial ED work up concerning for infectious process with leukocytosis to 26 and lactate of 2.9. Creatinine 1.5 from baseline of 1.0, patient does have mild right hydro likely from bladder mass near UVJ but suspect acute Cr increase from poor PO intake.   -Recommend admission to medicine service for work up of infectious process  -Continue foley catheter. Okay for hand irrigation with toomey syringe and normal saline if catheter stops draining. If unable to flush catheter, please notify  urology -Trend creatinine  -Patient will need either TURBT or IR biopsy of suspicious node for tissue diagnosis  -Will need to discuss with vascular surgery if patient can hold his DAPT (ASA/Eliquis) for procedure -Pending anticoagulation recommendations, tentative plan for TURBT on Monday 10/31  Aldine Contes 06/30/2021, 11:23 PM   I have seen and examined the patient and agree with the above assessment and plan.  38yM with presumed [id number] bladder cancer with large bladder mass and bulky RP, pelvic, iliac lymphadenopathy seen on recent CT A/P. Will coordinate for TURBT for tissue diagnosis, likely to be done on Monday afternoon. My surgery scheduler is working on posting. NPO after midnight Sunday night. Holding anticoagulation per vascular. Ordered CT hematuria and CT chest for staging. Will then engage med-onc to consider systemic options once tissue diagnosis performed.  [facility]  Pager: [phone]

## 2021-06-30 NOTE — ED Triage Notes (Signed)
Patient states he was sent to [facility] by urologist for further evaluation of hematuria that may be related to a mass in the patient's bladder. Indwelling urinary catheter in place from urology office.

## 2021-07-01 ENCOUNTER — Encounter (HOSPITAL_COMMUNITY): Payer: Self-pay | Admitting: Internal Medicine

## 2021-07-01 ENCOUNTER — Inpatient Hospital Stay (HOSPITAL_COMMUNITY): Payer: Medicaid Other

## 2021-07-01 ENCOUNTER — Other Ambulatory Visit: Payer: Self-pay | Admitting: Urology

## 2021-07-01 DIAGNOSIS — D62 Acute posthemorrhagic anemia: Secondary | ICD-10-CM | POA: Diagnosis present

## 2021-07-01 DIAGNOSIS — N179 Acute kidney failure, unspecified: Secondary | ICD-10-CM | POA: Diagnosis present

## 2021-07-01 DIAGNOSIS — R319 Hematuria, unspecified: Secondary | ICD-10-CM

## 2021-07-01 LAB — CBC
HCT: 21.3 % — ABNORMAL LOW (ref 39.0–52.0)
HCT: 21.7 % — ABNORMAL LOW (ref 39.0–52.0)
HCT: 21.1 % — ABNORMAL LOW (ref 39.0–52.0)
Hemoglobin: 7.6 g/dL — ABNORMAL LOW (ref 13.0–17.0)
Hemoglobin: 7.3 g/dL — ABNORMAL LOW (ref 13.0–17.0)
Hemoglobin: 7.4 g/dL — ABNORMAL LOW (ref 13.0–17.0)
MCH: 33.5 pg (ref 26.0–34.0)
MCH: 32 pg (ref 26.0–34.0)
MCH: 32.7 pg (ref 26.0–34.0)
MCHC: 35.7 g/dL (ref 30.0–36.0)
MCHC: 33.6 g/dL (ref 30.0–36.0)
MCHC: 35.1 g/dL (ref 30.0–36.0)
MCV: 93.8 fL (ref 80.0–100.0)
MCV: 95.2 fL (ref 80.0–100.0)
MCV: 93.4 fL (ref 80.0–100.0)
Platelets: 389 10*3/uL (ref 150–400)
Platelets: 382 10*3/uL (ref 150–400)
Platelets: 337 10*3/uL (ref 150–400)
RBC: 2.27 MIL/uL — ABNORMAL LOW (ref 4.22–5.81)
RBC: 2.28 MIL/uL — ABNORMAL LOW (ref 4.22–5.81)
RBC: 2.26 MIL/uL — ABNORMAL LOW (ref 4.22–5.81)
RDW: 13.2 % (ref 11.5–15.5)
RDW: 13.1 % (ref 11.5–15.5)
RDW: 13 % (ref 11.5–15.5)
WBC: 16.3 10*3/uL — ABNORMAL HIGH (ref 4.0–10.5)
WBC: 15.6 10*3/uL — ABNORMAL HIGH (ref 4.0–10.5)
WBC: 15.2 10*3/uL — ABNORMAL HIGH (ref 4.0–10.5)
nRBC: 0 % (ref 0.0–0.2)
nRBC: 0 % (ref 0.0–0.2)
nRBC: 0 % (ref 0.0–0.2)

## 2021-07-01 LAB — BLOOD CULTURE ID PANEL (REFLEXED) - BCID2

## 2021-07-01 LAB — BASIC METABOLIC PANEL
Anion gap: 16 — ABNORMAL HIGH (ref 5–15)
Anion gap: 9 (ref 5–15)
Anion gap: 10 (ref 5–15)
Anion gap: 7 (ref 5–15)
Anion gap: 12 (ref 5–15)
BUN: 17 mg/dL (ref 6–20)
BUN: 16 mg/dL (ref 6–20)
BUN: 15 mg/dL (ref 6–20)
BUN: 15 mg/dL (ref 6–20)
BUN: 14 mg/dL (ref 6–20)
CO2: 17 mmol/L — ABNORMAL LOW (ref 22–32)
CO2: 22 mmol/L (ref 22–32)
CO2: 22 mmol/L (ref 22–32)
CO2: 23 mmol/L (ref 22–32)
CO2: 18 mmol/L — ABNORMAL LOW (ref 22–32)
Calcium: 7.8 mg/dL — ABNORMAL LOW (ref 8.9–10.3)
Calcium: 7.7 mg/dL — ABNORMAL LOW (ref 8.9–10.3)
Calcium: 7.7 mg/dL — ABNORMAL LOW (ref 8.9–10.3)
Calcium: 7.4 mg/dL — ABNORMAL LOW (ref 8.9–10.3)
Calcium: 7.5 mg/dL — ABNORMAL LOW (ref 8.9–10.3)
Chloride: 87 mmol/L — ABNORMAL LOW (ref 98–111)
Chloride: 86 mmol/L — ABNORMAL LOW (ref 98–111)
Chloride: 89 mmol/L — ABNORMAL LOW (ref 98–111)
Chloride: 85 mmol/L — ABNORMAL LOW (ref 98–111)
Chloride: 87 mmol/L — ABNORMAL LOW (ref 98–111)
Creatinine, Ser: 1.37 mg/dL — ABNORMAL HIGH (ref 0.61–1.24)
Creatinine, Ser: 1.23 mg/dL (ref 0.61–1.24)
Creatinine, Ser: 1.14 mg/dL (ref 0.61–1.24)
Creatinine, Ser: 1.27 mg/dL — ABNORMAL HIGH (ref 0.61–1.24)
Creatinine, Ser: 1.2 mg/dL (ref 0.61–1.24)
GFR, Estimated: >60 mL/min (ref >60)
GFR, Estimated: >60 mL/min (ref >60)
GFR, Estimated: >60 mL/min (ref >60)
GFR, Estimated: >60 mL/min (ref >60)
GFR, Estimated: >60 mL/min (ref >60)
Glucose, Bld: 95 mg/dL (ref 70–99)
Glucose, Bld: 90 mg/dL (ref 70–99)
Glucose, Bld: 90 mg/dL (ref 70–99)
Glucose, Bld: 85 mg/dL (ref 70–99)
Glucose, Bld: 75 mg/dL (ref 70–99)
Potassium: 4.1 mmol/L (ref 3.5–5.1)
Potassium: 3.9 mmol/L (ref 3.5–5.1)
Potassium: 4.1 mmol/L (ref 3.5–5.1)
Potassium: 3.9 mmol/L (ref 3.5–5.1)
Potassium: 4 mmol/L (ref 3.5–5.1)
Sodium: 120 mmol/L — ABNORMAL LOW (ref 135–145)
Sodium: 117 mmol/L — CL (ref 135–145)
Sodium: 121 mmol/L — ABNORMAL LOW (ref 135–145)
Sodium: 115 mmol/L — CL (ref 135–145)
Sodium: 117 mmol/L — CL (ref 135–145)

## 2021-07-01 LAB — URINALYSIS, ROUTINE W REFLEX MICROSCOPIC
RBC / HPF: >50 RBC/hpf — ABNORMAL HIGH (ref 0–5)
WBC, UA: >50 WBC/hpf — ABNORMAL HIGH (ref 0–5)

## 2021-07-01 LAB — HIV ANTIBODY (ROUTINE TESTING W REFLEX): HIV Screen 4th Generation wRfx: NONREACTIVE

## 2021-07-01 LAB — SODIUM, URINE, RANDOM: Sodium, Ur: 18 mmol/L

## 2021-07-01 LAB — RESP PANEL BY RT-PCR (FLU A&B, COVID) ARPGX2
Influenza A by PCR: NEGATIVE
Influenza B by PCR: NEGATIVE
SARS Coronavirus 2 by RT PCR: NEGATIVE

## 2021-07-01 LAB — TSH: TSH: 13.445 u[IU]/mL — ABNORMAL HIGH (ref 0.350–4.500)

## 2021-07-01 LAB — LACTIC ACID, PLASMA: Lactic Acid, Venous: 1.5 mmol/L (ref 0.5–1.9)

## 2021-07-01 LAB — OSMOLALITY: Osmolality: 243 mOsm/kg — CL (ref 275–295)

## 2021-07-01 LAB — CORTISOL: Cortisol, Plasma: 23.1 ug/dL

## 2021-07-01 MED ORDER — SODIUM CHLORIDE 0.9 % IV SOLN
1.0000 g | INTRAVENOUS | Status: DC
  Administered 2021-07-01: 1 g via INTRAVENOUS
  Filled 2021-07-01: qty 10

## 2021-07-01 MED ORDER — ACETAMINOPHEN 325 MG PO TABS
650.0000 mg | ORAL_TABLET | Freq: Four times a day (QID) | ORAL | Status: DC | PRN
  Administered 2021-07-01 – 2021-07-02 (×2): 650 mg via ORAL
  Filled 2021-07-01 (×2): qty 2

## 2021-07-01 MED ORDER — ACETAMINOPHEN 650 MG RE SUPP: 650.0000 mg | Freq: Four times a day (QID) | RECTAL | Status: DC | PRN

## 2021-07-01 MED ORDER — SODIUM CHLORIDE 0.9 % IV SOLN
2.0000 g | Freq: Every day | INTRAVENOUS | Status: AC
  Administered 2021-07-01 – 2021-07-08 (×8): 2 g via INTRAVENOUS
  Filled 2021-07-01 (×8): qty 20

## 2021-07-01 MED ORDER — OXYCODONE-ACETAMINOPHEN 5-325 MG PO TABS
1.0000 | ORAL_TABLET | Freq: Once | ORAL | Status: AC
  Administered 2021-07-01: 1 via ORAL
  Filled 2021-07-01: qty 1

## 2021-07-01 MED ORDER — SODIUM CHLORIDE 0.9 % IV SOLN: 2.0000 g | INTRAVENOUS | Status: DC

## 2021-07-01 MED ORDER — IOHEXOL 350 MG/ML SOLN
100.0000 mL | Freq: Once | INTRAVENOUS | Status: AC | PRN
  Administered 2021-07-01: 100 mL via INTRAVENOUS

## 2021-07-01 NOTE — ED Notes (Signed)
RN placed a dressing on non adherent gauze to the 2 incisions on the right lower leg as well as the incision at the right groin. Yellow purulent drainage was noted that it soiled the pt gown and blanket

## 2021-07-01 NOTE — H&P (Addendum)
History and Physical    Arthur Mcmahon OVZ:[id number] DOB: 1966/03/25 DOA: 06/30/2021  PCP: Shary Key, DO  Patient coming from: Home.  Chief Complaint: Lethargy weakness.  HPI: Arthur Mcmahon is a 55 y.o. male with history of recent surgery for critical limb ischemia of the right lower extremity where patient had underwent endarterectomy and fistulotomy with thrombectomy presently on Eliquis had followed up with urology clinic for bladder mass found during imaging during last admission was found to be lethargic weak and difficult to walk was referred to the ER.  In the urology office patient also had Foley catheter placed.  Patient states over the last few days he was having increasing blood lower extremity edema and also has noticed increasing hematuria.  Patient has benign hematuria off-and-on for last few months.  Denies any nausea vomiting or diarrhea has been examined and exertional shortness of breath.  Patient states over the last few days patient has been drinking a lot of water.  Has not had any alcohol for the last 6 months.  ED Course: In the ER labs show creatinine of 1.5 with a lactic acid of 2.9 WBC count of 26.1 hemoglobin of 9.  Chest x-ray did not show anything acute on exam patient has significant bilateral lower EXTR edema with albumin of 2.4 left lower extreme pulses bone bleeding and right lower extremity pulses are followed only by Doppler and it is thready.  COVID test was negative.  Patient's sodium was significantly low when compared to recent past it was around 115.  Patient in the ER was given a liter bolus of normal saline blood cultures obtained admitted for further management.  Urology was consulted.  Review of Systems: As per HPI, rest all negative.   Past Medical History:  Diagnosis Date   Anemia    ETOH abuse    Hypertension    Peripheral vascular disease ([place])    Pneumonia     Past Surgical History:  Procedure Laterality Date   ABDOMINAL  AORTOGRAM W/LOWER EXTREMITY Bilateral 03/31/2021   Procedure: ABDOMINAL AORTOGRAM W/LOWER EXTREMITY;  Surgeon: [name], MD;  Location: [facility];  Service: Cardiovascular;  Laterality: Bilateral;   AMPUTATION Left 04/29/2021   Procedure: Left second Partial TOE AMPUTATION;  Surgeon: [name], MD;  Location: [place];  Service: Vascular;  Laterality: Left;   ENDARTERECTOMY FEMORAL Left 04/11/2021   Procedure: LEFT FEMORAL ENDARTERECTOMY;  Surgeon: [name], MD;  Location: [place];  Service: Vascular;  Laterality: Left;   ENDARTERECTOMY FEMORAL Right 06/18/2021   Procedure: ENDARTERECTOMY RIGHT FEMORAL ARTERY;  Surgeon: [name], MD;  Location: [place];  Service: Vascular;  Laterality: Right;   FASCIOTOMY Right 06/18/2021   Procedure: RIGHT LOWER EXTREMITY FASCIOTOMY;  Surgeon: [name], MD;  Location: [place];  Service: Vascular;  Laterality: Right;   FRACTURE SURGERY Right    femur   INSERTION OF ILIAC STENT Left 04/11/2021   Procedure: INSERTION OF LEFT ABOVE KNEE POPLITEAL STENT;  Surgeon: [name], MD;  Location: [place];  Service: Vascular;  Laterality: Left;   INTRAOPERATIVE ARTERIOGRAM Left 04/11/2021   Procedure: INTRA OPERATIVE ARTERIOGRAM;  Surgeon: [name], MD;  Location: [place];  Service: Vascular;  Laterality: Left;   PATCH ANGIOPLASTY Left 04/11/2021   Procedure: PATCH ANGIOPLASTY LEFT COMMON FEMORAL ARTERY, PROFUNDOPLASTY;  Surgeon: [name], MD;  Location: [place];  Service: Vascular;  Laterality: Left;   PATCH ANGIOPLASTY Right 06/18/2021  Procedure: PATCH ANGIOPLASTY USING Rueben Bash BIOLOGIC PATCH;  Surgeon: [name], MD;  Location: [place];  Service: Vascular;  Laterality: Right;   THROMBECTOMY FEMORAL ARTERY Right 06/18/2021   Procedure: THROMBECTOMY RIGHT LOWER EXTREMITY;  Surgeon: [name], MD;  Location: [place];  Service: Vascular;  Laterality: Right;   THROMBECTOMY  FEMORAL ARTERY Right 06/19/2021   Procedure: THROMBECTOMY RIGHT LOWER EXTREMITY VIA POPITEAL APPROACH;  Surgeon: [name], MD;  Location: [place];  Service: Vascular;  Laterality: Right;   TONSILLECTOMY       reports that he has been smoking cigarettes. He has a 12.50 pack-year smoking history. He has never used smokeless tobacco. He reports that he does not currently use alcohol. He reports current drug use. Drug: Marijuana.  No Known Allergies  Family History  Problem Relation Age of Onset   Cancer Mother    Heart failure Mother    Cancer Father     Prior to Admission medications   Medication Sig Start Date End Date Taking? Authorizing Provider  acetaminophen (TYLENOL) 500 MG tablet Take 500 mg by mouth every 6 (six) hours as needed for mild pain, fever or headache.   Yes [provider]  APIXABAN Arne Cleveland) VTE STARTER PACK (10MG  AND 5MG ) Take as directed on package: start with two-5mg  tablets twice daily for 7 days. On day 8, switch to one-5mg  tablet twice daily. 06/22/21  Yes Rhyne, Hulen Shouts, PA-C  aspirin EC 81 MG tablet Take 1 tablet (81 mg total) by mouth daily. Swallow whole. 03/31/21 03/31/22 Yes [name], MD  HYDROcodone-acetaminophen (NORCO) 5-325 MG tablet Take 1 tablet by mouth every 6 (six) hours as needed for moderate pain. 06/30/21  Yes Rhyne, Hulen Shouts, PA-C  Multiple Vitamin (MULTIVITAMIN WITH MINERALS) TABS tablet Take 1 tablet by mouth daily. 06/22/21  Yes Rhyne, Hulen Shouts, PA-C  apixaban (ELIQUIS) 5 MG TABS tablet Take 1 tablet (5 mg total) by mouth 2 (two) times daily. 06/29/21   Rhyne, Hulen Shouts, PA-C  atorvastatin (LIPITOR) 40 MG tablet Take 1 tablet (40 mg total) by mouth daily. Patient not taking: No sig reported 03/31/21 03/31/22  [name], MD  cephALEXin (KEFLEX) 500 MG capsule Take 1 capsule (500 mg total) by mouth 2 (two) times daily. 06/30/21   Gabriel Earing, PA-C    Physical Exam: Constitutional: Moderately  built and nourished. Vitals:   06/30/21 2338 07/01/21 0151 07/01/21 0200 07/01/21 0300  BP: 132/88 118/71 109/61 104/65  Pulse: 100 85 86 92  Resp: 20 20 20 18   Temp:      TempSrc:      SpO2: 100% 100% 99% 100%   Eyes: Anicteric no pallor. ENMT: No discharge from the ears eyes nose and mouth. Neck: No mass felt.  No neck rigidity. Respiratory: No rhonchi or crepitations. Cardiovascular: S1-S2 heard. Abdomen: Soft nontender bowel sound present. Musculoskeletal: Bilateral lower extremity edema present up to the thigh.  Pulses on the left lower extremity is bounding on the right side is only felt with Dopplers. Skin: No acute changes. Neurologic: Alert awake oriented to time place and person.  Moves all extremities. Psychiatric: Appears normal.  Normal affect.   Labs on Admission: I have personally reviewed following labs and imaging studies  CBC: Recent Labs  Lab 06/30/21 1758  WBC 26.1*  NEUTROABS 24.0*  HGB 9.0*  HCT 26.2*  MCV 94.2  PLT 268*   Basic Metabolic Panel: Recent Labs  Lab 06/30/21 1758  NA 115*  K 5.1  CL 81*  CO2 21*  GLUCOSE 91  BUN 18  CREATININE 1.56*  CALCIUM 8.5*   GFR: Estimated Creatinine Clearance: 61.8 mL/min (A) (by C-G formula based on SCr of 1.56 mg/dL (H)). Liver Function Tests: Recent Labs  Lab 06/30/21 1758  AST 21  ALT 13  ALKPHOS 85  BILITOT 1.3*  PROT 7.2  ALBUMIN 2.4*   No results for input(s): LIPASE, AMYLASE in the last 168 hours. No results for input(s): AMMONIA in the last 168 hours. Coagulation Profile: No results for input(s): INR, PROTIME in the last 168 hours. Cardiac Enzymes: No results for input(s): CKTOTAL, CKMB, CKMBINDEX, TROPONINI in the last 168 hours. BNP (last 3 results) No results for input(s): PROBNP in the last 8760 hours. HbA1C: No results for input(s): HGBA1C in the last 72 hours. CBG: No results for input(s): GLUCAP in the last 168 hours. Lipid Profile: No results for input(s): CHOL, HDL,  LDLCALC, TRIG, CHOLHDL, LDLDIRECT in the last 72 hours. Thyroid Function Tests: No results for input(s): TSH, T4TOTAL, FREET4, T3FREE, THYROIDAB in the last 72 hours. Anemia Panel: No results for input(s): VITAMINB12, FOLATE, FERRITIN, TIBC, IRON, RETICCTPCT in the last 72 hours. Urine analysis:    Component Value Date/Time   COLORURINE RED (A) 07/01/2021 0020   APPEARANCEUR TURBID (A) 07/01/2021 0020   LABSPEC  07/01/2021 0020    TEST NOT REPORTED DUE TO COLOR INTERFERENCE OF URINE PIGMENT   PHURINE  07/01/2021 0020    TEST NOT REPORTED DUE TO COLOR INTERFERENCE OF URINE PIGMENT   GLUCOSEU (A) 07/01/2021 0020    TEST NOT REPORTED DUE TO COLOR INTERFERENCE OF URINE PIGMENT   HGBUR (A) 07/01/2021 0020    TEST NOT REPORTED DUE TO COLOR INTERFERENCE OF URINE PIGMENT   BILIRUBINUR (A) 07/01/2021 0020    TEST NOT REPORTED DUE TO COLOR INTERFERENCE OF URINE PIGMENT   BILIRUBINUR small (A) 03/25/2021 1415   KETONESUR (A) 07/01/2021 0020    TEST NOT REPORTED DUE TO COLOR INTERFERENCE OF URINE PIGMENT   PROTEINUR (A) 07/01/2021 0020    TEST NOT REPORTED DUE TO COLOR INTERFERENCE OF URINE PIGMENT   UROBILINOGEN 1.0 03/25/2021 1415   NITRITE (A) 07/01/2021 0020    TEST NOT REPORTED DUE TO COLOR INTERFERENCE OF URINE PIGMENT   LEUKOCYTESUR (A) 07/01/2021 0020    TEST NOT REPORTED DUE TO COLOR INTERFERENCE OF URINE PIGMENT   Sepsis Labs: @LABRCNTIP (procalcitonin:4,lacticidven:4) ) Recent Results (from the past 240 hour(s))  Resp Panel by RT-PCR (Flu A&B, Covid) Nasopharyngeal Swab     Status: None   Collection Time: 07/01/21 12:21 AM   Specimen: Nasopharyngeal Swab; Nasopharyngeal(NP) swabs in vial transport medium  Result Value Ref Range Status   SARS Coronavirus 2 by RT PCR NEGATIVE NEGATIVE Final    Comment: (NOTE) SARS-CoV-2 target nucleic acids are NOT DETECTED.  The SARS-CoV-2 RNA is generally detectable in upper respiratory specimens during the acute phase of infection. The  lowest concentration of SARS-CoV-2 viral copies this assay can detect is 138 copies/mL. A negative result does not preclude SARS-Cov-2 infection and should not be used as the sole basis for treatment or other patient management decisions. A negative result may occur with  improper specimen collection/handling, submission of specimen other than nasopharyngeal swab, presence of viral mutation(s) within the areas targeted by this assay, and inadequate number of viral copies(<138 copies/mL). A negative result must be combined with clinical observations, patient history, and epidemiological information. The expected result is Negative.  Fact Sheet  for Patients:  EntrepreneurPulse.com.au  Fact Sheet for Healthcare Providers:  IncredibleEmployment.be  This test is no t yet approved or cleared by the [place] FDA and  has been authorized for detection and/or diagnosis of SARS-CoV-2 by FDA under an Emergency Use Authorization (EUA). This EUA will remain  in effect (meaning this test can be used) for the duration of the COVID-19 declaration under Section 564(b)(1) of the Act, 21 U.S.C.section 360bbb-3(b)(1), unless the authorization is terminated  or revoked sooner.       Influenza A by PCR NEGATIVE NEGATIVE Final   Influenza B by PCR NEGATIVE NEGATIVE Final    Comment: (NOTE) The Xpert Xpress SARS-CoV-2/FLU/RSV plus assay is intended as an aid in the diagnosis of influenza from Nasopharyngeal swab specimens and should not be used as a sole basis for treatment. Nasal washings and aspirates are unacceptable for Xpert Xpress SARS-CoV-2/FLU/RSV testing.  Fact Sheet for Patients: EntrepreneurPulse.com.au  Fact Sheet for Healthcare Providers: IncredibleEmployment.be  This test is not yet approved or cleared by the [place] FDA and has been authorized for detection and/or diagnosis of SARS-CoV-2 by FDA under  an Emergency Use Authorization (EUA). This EUA will remain in effect (meaning this test can be used) for the duration of the COVID-19 declaration under Section 564(b)(1) of the Act, 21 U.S.C. section 360bbb-3(b)(1), unless the authorization is terminated or revoked.  Performed at [facility], [place] [address], [place], [place] [address]      Radiological Exams on Admission: DG Chest 2 View  Result Date: 06/30/2021 CLINICAL DATA:  Possible sepsis EXAM: CHEST - 2 VIEW COMPARISON:  03/22/2021 FINDINGS: Heart and mediastinal contours are within normal limits. No focal opacities or effusions. No acute bony abnormality. IMPRESSION: No active cardiopulmonary disease. Electronically Signed   By: Rolm Baptise M.D.   On: 06/30/2021 18:58   VAS [place] ABI WITH/WO TBI  Result Date: 06/30/2021  LOWER EXTREMITY DOPPLER STUDY Patient Name:  Arthur Mcmahon  Date of Exam:   06/30/2021 Medical Rec #: [id number]       Accession #:    [id number] Date of Birth: [birth year]       Patient Gender: M Patient Age:   89 years Exam Location:  [facility] Procedure:      VAS [place] ABI WITH/WO TBI Referring Phys: Aldona Bar RHYNE --------------------------------------------------------------------------------  Indications: Claudication, rest pain, and peripheral artery disease. High Risk Factors: Hypertension, current smoker.  Vascular Interventions: 06/19/2021 Thrombectomy of right popliteal artery                         06/18/2021 Right femoral endarterectomy , thrombectomy                         and fasciotomy                         04/11/2021 Left femoral endarterectomy. Performing Technologist: Delorise Shiner RVT  Examination Guidelines: A complete evaluation includes at minimum, Doppler waveform signals and systolic blood pressure reading at the level of bilateral brachial, anterior tibial, and posterior tibial arteries, when vessel segments are accessible. Bilateral testing is considered an integral part  of a complete examination. Photoelectric Plethysmograph (PPG) waveforms and toe systolic pressure readings are included as required and additional duplex testing as needed. Limited examinations for reoccurring indications may be performed as noted.  ABI Findings: +---------+------------------+-----+----------+--------+ Right    Rt  Pressure (mmHg)IndexWaveform  Comment  +---------+------------------+-----+----------+--------+ Brachial 141                                       +---------+------------------+-----+----------+--------+ PTA      100               0.71 monophasic         +---------+------------------+-----+----------+--------+ PERO                            triphasic          +---------+------------------+-----+----------+--------+ DP       0                 0.00 absent             +---------+------------------+-----+----------+--------+ Great Toe0                 0.00 Absent             +---------+------------------+-----+----------+--------+ +---------+------------------+-----+---------+-------+ Left     Lt Pressure (mmHg)IndexWaveform Comment +---------+------------------+-----+---------+-------+ Brachial 141                                     +---------+------------------+-----+---------+-------+ PTA      202               1.43 triphasic        +---------+------------------+-----+---------+-------+ DP       177               1.26 biphasic         +---------+------------------+-----+---------+-------+ Great Toe0                 0.00                  +---------+------------------+-----+---------+-------+ +-------+-----------+-----------+------------+------------+ ABI/TBIToday's ABIToday's TBIPrevious ABIPrevious TBI +-------+-----------+-----------+------------+------------+ Right  0.71       0          0.62        0.43         +-------+-----------+-----------+------------+------------+ Left   1.43       0          1.32         0.46         +-------+-----------+-----------+------------+------------+ Bilateral ABIs appear essentially unchanged compared to prior study on 05/31/2021.  Summary: Right: Resting right ankle-brachial index indicates moderate right lower extremity arterial disease. The right toe-brachial index is abnormal. Left: Resting left ankle-brachial index indicates noncompressible left lower extremity arteries. The left toe-brachial index is abnormal.  *See table(s) above for measurements and observations.  Electronically signed by Deitra Mayo MD on 06/30/2021 at 3:28:33 PM.    Final    VAS [place] LOWER EXTREMITY ARTERIAL DUPLEX  Result Date: 06/30/2021 LOWER EXTREMITY ARTERIAL DUPLEX STUDY Patient Name:  Arthur Mcmahon  Date of Exam:   06/30/2021 Medical Rec #: [id number]       Accession #:    [id number] Date of Birth: 1966/03/25       Patient Gender: M Patient Age:   21 years Exam Location:  [facility] Procedure:      VAS [place] LOWER EXTREMITY ARTERIAL DUPLEX Referring Phys: Aldona Bar RHYNE --------------------------------------------------------------------------------  Indications: Claudication, rest pain, and peripheral artery disease. High Risk Factors: Hypertension, current  smoker.  Vascular Interventions: 06/19/2021 Thrombectomy of right popliteal artery                         06/18/2021 Right femoral endarterectomy , thrombectomy                         and fasciotomy                         04/11/2021 Left femoral endarterectomy. Current ABI:            Right: 0.71, Left: 1.43 Performing Technologist: Delorise Shiner RVT  Examination Guidelines: A complete evaluation includes B-mode imaging, spectral Doppler, color Doppler, and power Doppler as needed of all accessible portions of each vessel. Bilateral testing is considered an integral part of a complete examination. Limited examinations for reoccurring indications may be performed as noted.   +----------+--------+-----+--------+----------+---------+ RIGHT     PSV cm/sRatioStenosisWaveform  Comments  +----------+--------+-----+--------+----------+---------+ CFA Distal80                   triphasic           +----------+--------+-----+--------+----------+---------+ SFA Prox  119                  triphasic           +----------+--------+-----+--------+----------+---------+ SFA Mid   109                  triphasic           +----------+--------+-----+--------+----------+---------+ SFA Distal165                  triphasic           +----------+--------+-----+--------+----------+---------+ POP Prox  43                   triphasic           +----------+--------+-----+--------+----------+---------+ POP Distal152                  triphasic           +----------+--------+-----+--------+----------+---------+ PTA Distal12                   monophasicHyperemic +----------+--------+-----+--------+----------+---------+ Hypoechoic structure seen at right groin with blood flow detected within, consistent with partially thrombosed pseudo-aneurysm.  Summary: Right: No stenosis identified from the common femoral artery to the popliteal space. Hypoechoic structure seen at inguinal space. Calf imaging limited by bandages and recent surgical sites.  See table(s) above for measurements and observations. Electronically signed by Deitra Mayo MD on 06/30/2021 at 3:28:45 PM.    Final    VAS [place] LOWER EXTREMITY VENOUS (DVT)  Result Date: 06/30/2021  Lower Venous DVT Study Patient Name:  Arthur Mcmahon  Date of Exam:   06/30/2021 Medical Rec #: [id number]       Accession #:    [id number] Date of Birth: 1966-03-25       Patient Gender: M Patient Age:   49 years Exam Location:  [facility] Procedure:      VAS [place] LOWER EXTREMITY VENOUS (DVT) Referring Phys: Aldona Bar RHYNE --------------------------------------------------------------------------------   Indications: Pain, Swelling, and Edema.  Risk Factors: Surgery 06/19/2021 Thrombectomy of right popliteal artery. Limitations: Bandages and Patient discomfort. Performing Technologist: Delorise Shiner RVT  Examination Guidelines: A complete evaluation includes B-mode imaging, spectral Doppler, color Doppler, and power Doppler as needed of all accessible  portions of each vessel. Bilateral testing is considered an integral part of a complete examination. Limited examinations for reoccurring indications may be performed as noted. The reflux portion of the exam is performed with the patient in reverse Trendelenburg.  +---------+---------------+---------+-----------+----------+--------------+ RIGHT    CompressibilityPhasicitySpontaneityPropertiesThrombus Aging +---------+---------------+---------+-----------+----------+--------------+ CFV      Full           No       Yes                                 +---------+---------------+---------+-----------+----------+--------------+ SFJ      Full           No       Yes                                 +---------+---------------+---------+-----------+----------+--------------+ FV Prox  Full           No       Yes                                 +---------+---------------+---------+-----------+----------+--------------+ FV Mid   Full           No       Yes                                 +---------+---------------+---------+-----------+----------+--------------+ FV DistalFull           No       Yes                                 +---------+---------------+---------+-----------+----------+--------------+ POP      Full           No       Yes                                 +---------+---------------+---------+-----------+----------+--------------+ PTV      Full                                                        +---------+---------------+---------+-----------+----------+--------------+ GSV      Full                                                         +---------+---------------+---------+-----------+----------+--------------+ SSV      Full                                                        +---------+---------------+---------+-----------+----------+--------------+ Large hypoechoic structure seen in right inguinal space with blood flow detected within. Little respiratory variation was noted. Limited imaging showed a patent distal  IVC  +----+---------------+---------+-----------+----------+--------------+ LEFTCompressibilityPhasicitySpontaneityPropertiesThrombus Aging +----+---------------+---------+-----------+----------+--------------+ CFV Full           No       Yes                                 +----+---------------+---------+-----------+----------+--------------+ No respiratory variation noted at the left common femoral vein.    Summary: RIGHT: - There is no evidence of deep vein thrombosis in the lower extremity. - There is no evidence of superficial venous thrombosis.  - No cystic structure found in the popliteal fossa. - Non phasic Doppler signal may be consistent with proximal venous obstruction/ extrinsic compression.  LEFT: - No evidence of common femoral vein obstruction.  *See table(s) above for measurements and observations. Electronically signed by Deitra Mayo MD on 06/30/2021 at 3:28:19 PM.    Final      Assessment/Plan Principal Problem:   Hyponatremia Active Problems:   Peripheral arterial disease ([place])   ARF (acute renal failure) (H[facility])   Acute blood loss anemia   Hematuria    Acute hyponatremia -given the significant peripheral edema and patient also admitting to be drinking a lot of free fluids will restrict fluids at this time likely causing patient's hyponatremia.  Unable to do urine sodium and osmolality due to significant hematuria.  We will follow basic metabolic panel and see sodium trends closely.  Check cortisol levels and TSH and uric acid  levels. Hematuria with bladder mass being followed by urologist.  Will likely need procedure.  Will need to discuss with vascular surgery to see if it is okay to hold patient's anticoagulants. Recent admission for critical limb ischemia of the right lower extremity patient is presently on Eliquis and aspirin.  Will need to discuss with vascular surgery to see if he can hold it for urology procedure and also ongoing hematuria. Acute blood loss anemia will follow CBC transfuse if hemoglobin is less than 7.  Patient agrees for transfusion. Acute renal failure cause not clear patient has Foley catheter placed by urology.  Patient also received fluid bolus in the ER.  We will closely monitor intake output and metabolic panel. Leukocytosis with elevated lactic acid level  -follow blood cultures for now I kept patient on ceftriaxone. Prior history of alcohol abuse has not had any alcohol for the last 6 months as per the patient. Tobacco abuse advised about quitting.  Since patient has severe hyponatremia with ongoing hematuria and will need further management and procedure will need inpatient status.   Addendum -discussed with on-call vascular surgeon Dr. Stanford Breed who at this time requested holding of anticoagulation including Eliquis and aspirin due to ongoing severe hematuria and dropping hemoglobin.  They will be seeing patient in consult.   DVT prophylaxis: Holding anticoagulation due to ongoing severe hematuria was discussed with vascular surgeon..  Patient also has severe vascular vascular disease so holding SCDs for now. Code Status: Full code. Family Communication: Discussed with patient. Disposition Plan: Home. Consults called: Urology. Admission status: Inpatient.   Rise Patience MD [facility] Pager [phone].  If 7PM-7AM, please contact night-coverage [email or web address] Password [email or web address]  07/01/2021, 3:25 AM

## 2021-07-01 NOTE — ED Notes (Signed)
Arthur Mcmahon ex-wife [phone] requesting to speak to the patient

## 2021-07-01 NOTE — ED Notes (Signed)
Patient transported to CT 

## 2021-07-01 NOTE — ED Notes (Signed)
Pt resting on stretcher with eyes closed, respirations even and unlabored. Pt denies any complaints at this time. Noted new urine going through foley drainage system is lighter in color and pink tinged instead of being red like before, continues to have small clots noted in drainage tubing. No acute changes noted. Will continue to monitor. Lights off, side rails up x2, call bell within reach.

## 2021-07-01 NOTE — Care Plan (Signed)
This 55 years old Male with history of recent endarterectomy and fistulotomy with thrombectomy for critical right limb ischemia, presently on Eliquis, had followed up with urology clinic for bladder mass, was sent in the ED for hematuria , generalized weakness and lethargy.  Patient has intermittent hematuria for last few months.  Urology and vascular surgery is consulted, recommended to hold anticoagulation due to ongoing severe hematuria and drop in hemoglobin.  Urology is planning TURBT for tissue diagnosis most likely on Monday. Patient was seen and examined, still has blood in the urine.  Denies any pain.

## 2021-07-01 NOTE — Progress Notes (Signed)
PHARMACY - PHYSICIAN COMMUNICATION CRITICAL VALUE ALERT - BLOOD CULTURE IDENTIFICATION (BCID)  Arthur Mcmahon is an 55 y.o. male who presented to [facility] on 06/30/2021 with a chief complaint of history of recent surgery for critical limb ischemia of the right lower extremity where patient had underwent endarterectomy and fistulotomy with thrombectomy presently on Eliquis had followed up with urology clinic for bladder mass found during imaging during last admission was found to be lethargic weak and difficult to walk was referred to the ER.  Assessment:   1 out 4 bottle + GPC BCID showing Strep species (no resistance) Patient here for concern of UTI, limb ischemia   Name of physician (or Provider) Contacted: Dr. Dwyane Dee  Current antibiotics: Ceftriaxone 1gm q24h  Changes to prescribed antibiotics recommended:  Increased to ceftriaxone 2gm q24hr for bacteremia  Results for orders placed or performed during the hospital encounter of 06/30/21  Blood Culture ID Panel (Reflexed) (Collected: 06/30/2021  5:58 PM)  Result Value Ref Range   Enterococcus faecalis NOT DETECTED NOT DETECTED   Enterococcus Faecium NOT DETECTED NOT DETECTED   Listeria monocytogenes NOT DETECTED NOT DETECTED   Staphylococcus species NOT DETECTED NOT DETECTED   Staphylococcus aureus (BCID) NOT DETECTED NOT DETECTED   Staphylococcus epidermidis NOT DETECTED NOT DETECTED   Staphylococcus lugdunensis NOT DETECTED NOT DETECTED   Streptococcus species DETECTED (A) NOT DETECTED   Streptococcus agalactiae NOT DETECTED NOT DETECTED   Streptococcus pneumoniae NOT DETECTED NOT DETECTED   Streptococcus pyogenes NOT DETECTED NOT DETECTED   A.calcoaceticus-baumannii NOT DETECTED NOT DETECTED   Bacteroides fragilis NOT DETECTED NOT DETECTED   Enterobacterales NOT DETECTED NOT DETECTED   Enterobacter cloacae complex NOT DETECTED NOT DETECTED   Escherichia coli NOT DETECTED NOT DETECTED   Klebsiella aerogenes NOT DETECTED NOT  DETECTED   Klebsiella oxytoca NOT DETECTED NOT DETECTED   Klebsiella pneumoniae NOT DETECTED NOT DETECTED   Proteus species NOT DETECTED NOT DETECTED   Salmonella species NOT DETECTED NOT DETECTED   Serratia marcescens NOT DETECTED NOT DETECTED   Haemophilus influenzae NOT DETECTED NOT DETECTED   Neisseria meningitidis NOT DETECTED NOT DETECTED   Pseudomonas aeruginosa NOT DETECTED NOT DETECTED   Stenotrophomonas maltophilia NOT DETECTED NOT DETECTED   Candida albicans NOT DETECTED NOT DETECTED   Candida auris NOT DETECTED NOT DETECTED   Candida glabrata NOT DETECTED NOT DETECTED   Candida krusei NOT DETECTED NOT DETECTED   Candida parapsilosis NOT DETECTED NOT DETECTED   Candida tropicalis NOT DETECTED NOT DETECTED   Cryptococcus neoformans/gattii NOT DETECTED NOT DETECTED    Thank you for allowing pharmacy to be a part of this patient's care.  Donnald Garre, PharmD Clinical Pharmacist  Please check AMION for all [place] numbers After 10:00 PM, call [place] [phone]

## 2021-07-01 NOTE — ED Notes (Signed)
Pt given pillow per request and HOB changed to level of comfort. No acute changes noted. Will continue to monitor. Foley draining without difficulty. Lights dimmed, call bell within reach, side rails up x2.

## 2021-07-01 NOTE — ED Notes (Signed)
Arthur Mcmahon  [id number] Corrected address

## 2021-07-01 NOTE — ED Notes (Signed)
Critical lab value: Serum Osmolality 243. Dr. Hal Hope notified. Also unable to run urine Osmolality due to gross hematuria.

## 2021-07-01 NOTE — Consult Note (Addendum)
[facility] [place]  ASSESSMENT / PLAN: 55 y.o. 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 By clinical exam and recent ABI. Hold Eliquis. Continue ASA if deemed safe by Urology / IM. Resume Eliquis as soon as safe to do so. Will follow.   CHIEF COMPLAINT: weakness  HISTORY OF PRESENT ILLNESS: Arthur Mcmahon is a 55 y.o. male known to our service with complex recent vascular history (see below).  The patient presents to the ER today last night for evaluation of weakness and hematuria.  The patient had been seen in our office earlier that day.  On presentation to the ER, he was found to be anemic with a hemoglobin of 9, profoundly hyponatremic with a sodium of 115, acute kidney injury with creatinine of 1.5.  Lactic acidosis to 2.9, and leukocytosis to 26.1.  Patient was admitted to the medicine service.  All anticoagulation has been held.  VASCULAR SURGICAL HISTORY:  03/31/2021 left lower extremity angiogram 04/11/21 left femoral endarterectomy/profundoplasty, left popliteal angioplasty and stenting (7 x 40 mm Innova) for critical limb ischemia 04/29/2021 left second toe amputation 06/18/2021 right femoral endarterectomy and profundoplasty, right lower extremity thrombectomy, 4 compartment fasciotomy for acute limb ischemia 06/20/2019  repeat right lower extremity thrombectomy   Past Medical History:  Diagnosis Date   Anemia    ETOH abuse    Hypertension    Peripheral vascular disease ([place])    Pneumonia     Past Surgical History:  Procedure Laterality Date   ABDOMINAL AORTOGRAM W/LOWER EXTREMITY Bilateral 03/31/2021   Procedure: ABDOMINAL AORTOGRAM W/LOWER EXTREMITY;  Surgeon: [name], MD;  Location: [place] [facility];  Service:  Cardiovascular;  Laterality: Bilateral;   AMPUTATION Left 04/29/2021   Procedure: Left second Partial TOE AMPUTATION;  Surgeon: [name], MD;  Location: [place];  Service: Vascular;  Laterality: Left;   ENDARTERECTOMY FEMORAL Left 04/11/2021   Procedure: LEFT FEMORAL ENDARTERECTOMY;  Surgeon: [name], MD;  Location: [place];  Service: Vascular;  Laterality: Left;   ENDARTERECTOMY FEMORAL Right 06/18/2021   Procedure: ENDARTERECTOMY RIGHT FEMORAL ARTERY;  Surgeon: [name], MD;  Location: [place];  Service: Vascular;  Laterality: Right;   FASCIOTOMY Right 06/18/2021   Procedure: RIGHT LOWER EXTREMITY FASCIOTOMY;  Surgeon: [name], MD;  Location: [place];  Service: Vascular;  Laterality: Right;   FRACTURE SURGERY Right    femur   INSERTION OF ILIAC STENT Left 04/11/2021   Procedure: INSERTION OF LEFT ABOVE KNEE POPLITEAL STENT;  Surgeon: [name], MD;  Location: [place];  Service: Vascular;  Laterality: Left;   INTRAOPERATIVE ARTERIOGRAM Left 04/11/2021   Procedure: INTRA OPERATIVE ARTERIOGRAM;  Surgeon: [name], MD;  Location: [place];  Service: Vascular;  Laterality: Left;   PATCH ANGIOPLASTY Left 04/11/2021   Procedure: PATCH ANGIOPLASTY LEFT COMMON FEMORAL ARTERY, PROFUNDOPLASTY;  Surgeon: [name], MD;  Location: [place];  Service: Vascular;  Laterality: Left;   PATCH ANGIOPLASTY Right 06/18/2021   Procedure: PATCH ANGIOPLASTY USING Rueben Bash BIOLOGIC PATCH;  Surgeon: [name], MD;  Location: [place];  Service: Vascular;  Laterality: Right;   THROMBECTOMY FEMORAL ARTERY Right 06/18/2021   Procedure: THROMBECTOMY RIGHT LOWER EXTREMITY;  Surgeon: [name], MD;  Location: [place];  Service: Vascular;  Laterality:  Right;   THROMBECTOMY FEMORAL ARTERY Right 06/19/2021   Procedure: THROMBECTOMY RIGHT LOWER EXTREMITY VIA POPITEAL APPROACH;  Surgeon: [name], MD;  Location: [facility];  Service: Vascular;   Laterality: Right;   TONSILLECTOMY      Family History  Problem Relation Age of Onset   Cancer Mother    Heart failure Mother    Cancer Father     Social History   Socioeconomic History   Marital status: Divorced    Spouse name: Not on file   Number of children: Not on file   Years of education: Not on file   Highest education level: Not on file  Occupational History   Not on file  Tobacco Use   Smoking status: Every Day    Packs/day: 0.50    Years: 25.00    Pack years: 12.50    Types: Cigarettes   Smokeless tobacco: Never  Vaping Use   Vaping Use: Never used  Substance and Sexual Activity   Alcohol use: Not Currently    Comment: daily   Drug use: Yes    Types: Marijuana    Comment: 2-3 a week   Sexual activity: Not on file  Other Topics Concern   Not on file  Social History Narrative   Not on file   Social Determinants of Health   Financial Resource Strain: Not on file  Food Insecurity: Not on file  Transportation Needs: Not on file  Physical Activity: Not on file  Stress: Not on file  Social Connections: Not on file  Intimate Partner Violence: Not on file    No Known Allergies  Current Facility-Administered Medications  Medication Dose Route Frequency Provider Last Rate Last Admin   acetaminophen (TYLENOL) tablet 650 mg  650 mg Oral Q6H PRN Rise Patience, MD       Or   acetaminophen (TYLENOL) suppository 650 mg  650 mg Rectal Q6H PRN Rise Patience, MD       cefTRIAXone (ROCEPHIN) 2 g in sodium chloride 0.9 % 100 mL IVPB  2 g Intravenous Daily Shawna Clamp, MD       Current Outpatient Medications  Medication Sig Dispense Refill   acetaminophen (TYLENOL) 500 MG tablet Take 500 mg by mouth every 6 (six) hours as needed for mild pain, fever or headache.     APIXABAN (ELIQUIS) VTE STARTER PACK (10MG  AND 5MG ) Take as directed on package: start with two-5mg  tablets twice daily for 7 days. On day 8, switch to one-5mg  tablet twice daily. 74  each 0   aspirin EC 81 MG tablet Take 1 tablet (81 mg total) by mouth daily. Swallow whole. 365 tablet 0   HYDROcodone-acetaminophen (NORCO) 5-325 MG tablet Take 1 tablet by mouth every 6 (six) hours as needed for moderate pain. 20 tablet 0   Multiple Vitamin (MULTIVITAMIN WITH MINERALS) TABS tablet Take 1 tablet by mouth daily.     apixaban (ELIQUIS) 5 MG TABS tablet Take 1 tablet (5 mg total) by mouth 2 (two) times daily. 60 tablet 11   atorvastatin (LIPITOR) 40 MG tablet Take 1 tablet (40 mg total) by mouth daily. (Patient not taking: No sig reported) 30 tablet 11   cephALEXin (KEFLEX) 500 MG capsule Take 1 capsule (500 mg total) by mouth 2 (two) times daily. 20 capsule 0    REVIEW OF SYSTEMS:  [X]  denotes positive finding, [ ]  denotes negative finding Cardiac  Comments:  Chest pain or chest pressure:    Shortness of breath  upon exertion:    Short of breath when lying flat:    Irregular heart rhythm:        Vascular    Pain in calf, thigh, or hip brought on by ambulation:    Pain in feet at night that wakes you up from your sleep:     Blood clot in your veins:    Leg swelling:         Pulmonary    Oxygen at home:    Productive cough:     Wheezing:         Neurologic    Sudden weakness in arms or legs:     Sudden numbness in arms or legs:     Sudden onset of difficulty speaking or slurred speech:    Temporary loss of vision in one eye:     Problems with dizziness:         Gastrointestinal    Blood in stool:     Vomited blood:         Genitourinary    Burning when urinating:     Blood in urine:        Psychiatric    Major depression:         Hematologic    Bleeding problems:    Problems with blood clotting too easily:        Skin    Rashes or ulcers:        Constitutional    Fever or chills:      PHYSICAL EXAM Vitals:   07/01/21 0600 07/01/21 0707 07/01/21 0800 07/01/21 0815  BP: 109/67 111/70 111/82 (!) 110/98  Pulse: 80 79 80 81  Resp: 16 16  (!) 22   Temp:      TempSrc:      SpO2: 100% 100% 100% 100%    Constitutional: chronically ill appearing older than staged age. no distress. Appears well nourished.  Neurologic: CN intact. no focal findings. no sensory loss. Psychiatric:  Mood and affect symmetric and appropriate. Eyes:  No icterus. No conjunctival pallor. Ears, nose, throat:  mucous membranes moist. Midline trachea.  Cardiac: regular rate and rhythm.  Respiratory:  unlabored. Abdominal:  soft, non-tender, non-distended.  Peripheral vascular: RLE 2+ edema about the foot. Leg is wrapped. After removing the wrap, incisions appear intact. No doppler machine available, but foot feels warm. Capillary refill <2s. All incisions healing. Extremity:  no cyanosis. no pallor.  Skin: no gangrene. no ulceration.  Lymphatic: no Stemmer's sign. no palpable lymphadenopathy.  PERTINENT LABORATORY AND RADIOLOGIC DATA  Most recent CBC CBC Latest Ref Rng & Units 07/01/2021 07/01/2021 06/30/2021  WBC 4.0 - 10.5 K/uL 15.6(H) 16.3(H) 26.1(H)  Hemoglobin 13.0 - 17.0 g/dL 7.3(L) 7.6(L) 9.0(L)  Hematocrit 39.0 - 52.0 % 21.7(L) 21.3(L) 26.2(L)  Platelets 150 - 400 K/uL 382 389 498(H)     Most recent CMP CMP Latest Ref Rng & Units 07/01/2021 06/30/2021 06/19/2021  Glucose 70 - 99 mg/dL 90 91 158(H)  BUN 6 - 20 mg/dL 16 18 15   Creatinine 0.61 - 1.24 mg/dL 1.23 1.56(H) 1.00  Sodium 135 - 145 mmol/L 117(LL) 115(LL) 139  Potassium 3.5 - 5.1 mmol/L 3.9 5.1 4.0  Chloride 98 - 111 mmol/L 86(L) 81(L) 106  CO2 22 - 32 mmol/L 22 21(L) 22  Calcium 8.9 - 10.3 mg/dL 7.7(L) 8.5(L) 6.8(L)  Total Protein 6.5 - 8.1 g/dL - 7.2 -  Total Bilirubin 0.3 - 1.2 mg/dL - 1.3(H) -  Alkaline Phos 38 - 126  U/L - 85 -  AST 15 - 41 U/L - 21 -  ALT 0 - 44 U/L - 13 -    Renal function Estimated Creatinine Clearance: 78.4 mL/min (by C-G formula based on SCr of 1.23 mg/dL).  No results found for: HGBA1C  LDL Cholesterol  Date Value Ref Range Status  06/19/2021 49 0 -  99 mg/dL Final    Comment:           Total Cholesterol/HDL:CHD Risk Coronary Heart Disease Risk Table                     Men   Women  1/2 Average Risk   3.4   3.3  Average Risk       5.0   4.4  2 X Average Risk   9.6   7.1  3 X Average Risk  23.4   11.0        Use the calculated Patient Ratio above and the CHD Risk Table to determine the patient's CHD Risk.        ATP III CLASSIFICATION (LDL):  <100     mg/dL   Optimal  100-129  mg/dL   Near or Above                    Optimal  130-159  mg/dL   Borderline  160-189  mg/dL   High  >190     mg/dL   Very High Performed at [facility] [address] [place], [place] [address]      [name]. Stanford Breed, MD [facility] Phone Number: [phone] 07/01/2021 8:48 AM  Total time spent on preparing this encounter including chart review, data review, collecting history, examining the patient, coordinating care for this established patient, 40 minutes.  Portions of this report may have been transcribed using voice recognition software.  Every effort has been made to ensure accuracy; however, inadvertent computerized transcription errors may still be present.

## 2021-07-01 NOTE — ED Notes (Signed)
Admitting MD at bedside.

## 2021-07-01 NOTE — ED Notes (Signed)
Pt given soda and sandwich bag. No acute changes noted. Will continue to monitor.

## 2021-07-02 DIAGNOSIS — L8990 Pressure ulcer of unspecified site, unspecified stage: Secondary | ICD-10-CM | POA: Diagnosis present

## 2021-07-02 LAB — BASIC METABOLIC PANEL
Anion gap: 8 (ref 5–15)
Anion gap: 9 (ref 5–15)
BUN: 11 mg/dL (ref 6–20)
BUN: 11 mg/dL (ref 6–20)
CO2: 23 mmol/L (ref 22–32)
CO2: 24 mmol/L (ref 22–32)
Calcium: 7.7 mg/dL — ABNORMAL LOW (ref 8.9–10.3)
Calcium: 7.7 mg/dL — ABNORMAL LOW (ref 8.9–10.3)
Chloride: 89 mmol/L — ABNORMAL LOW (ref 98–111)
Chloride: 88 mmol/L — ABNORMAL LOW (ref 98–111)
Creatinine, Ser: 1.09 mg/dL (ref 0.61–1.24)
Creatinine, Ser: 1.07 mg/dL (ref 0.61–1.24)
GFR, Estimated: >60 mL/min (ref >60)
GFR, Estimated: >60 mL/min (ref >60)
Glucose, Bld: 99 mg/dL (ref 70–99)
Glucose, Bld: 86 mg/dL (ref 70–99)
Potassium: 3.8 mmol/L (ref 3.5–5.1)
Potassium: 4.1 mmol/L (ref 3.5–5.1)
Sodium: 120 mmol/L — ABNORMAL LOW (ref 135–145)
Sodium: 121 mmol/L — ABNORMAL LOW (ref 135–145)

## 2021-07-02 LAB — GLUCOSE, CAPILLARY: Glucose-Capillary: 117 mg/dL — ABNORMAL HIGH (ref 70–99)

## 2021-07-02 LAB — HEMOGLOBIN AND HEMATOCRIT, BLOOD
HCT: 25.5 % — ABNORMAL LOW (ref 39.0–52.0)
HCT: 25 % — ABNORMAL LOW (ref 39.0–52.0)
Hemoglobin: 8.6 g/dL — ABNORMAL LOW (ref 13.0–17.0)
Hemoglobin: 8.4 g/dL — ABNORMAL LOW (ref 13.0–17.0)

## 2021-07-02 LAB — MAGNESIUM: Magnesium: 2 mg/dL (ref 1.7–2.4)

## 2021-07-02 LAB — PHOSPHORUS: Phosphorus: 2.2 mg/dL — ABNORMAL LOW (ref 2.5–4.6)

## 2021-07-02 LAB — CBC
HCT: 19.8 % — ABNORMAL LOW (ref 39.0–52.0)
Hemoglobin: 7 g/dL — ABNORMAL LOW (ref 13.0–17.0)
MCH: 33 pg (ref 26.0–34.0)
MCHC: 35.4 g/dL (ref 30.0–36.0)
MCV: 93.4 fL (ref 80.0–100.0)
Platelets: 338 10*3/uL (ref 150–400)
RBC: 2.12 MIL/uL — ABNORMAL LOW (ref 4.22–5.81)
RDW: 13 % (ref 11.5–15.5)
WBC: 11.3 10*3/uL — ABNORMAL HIGH (ref 4.0–10.5)
nRBC: 0 % (ref 0.0–0.2)

## 2021-07-02 LAB — PREPARE RBC (CROSSMATCH)

## 2021-07-02 MED ORDER — SODIUM CHLORIDE 0.9% IV SOLUTION: Freq: Once | INTRAVENOUS | Status: AC

## 2021-07-02 MED ORDER — MORPHINE SULFATE (PF) 2 MG/ML IV SOLN
2.0000 mg | INTRAVENOUS | Status: DC | PRN
  Administered 2021-07-03 – 2021-07-08 (×19): 2 mg via INTRAVENOUS
  Filled 2021-07-02 (×19): qty 1

## 2021-07-02 MED ORDER — LEVOTHYROXINE SODIUM 50 MCG PO TABS
50.0000 ug | ORAL_TABLET | Freq: Every day | ORAL | Status: DC
  Administered 2021-07-03 – 2021-07-08 (×5): 50 ug via ORAL
  Filled 2021-07-02 (×5): qty 1

## 2021-07-02 MED ORDER — CHLORHEXIDINE GLUCONATE CLOTH 2 % EX PADS
6.0000 | MEDICATED_PAD | Freq: Every day | CUTANEOUS | Status: DC
  Administered 2021-07-02 – 2021-07-06 (×5): 6 via TOPICAL

## 2021-07-02 MED ORDER — OXYCODONE-ACETAMINOPHEN 5-325 MG PO TABS
1.0000 | ORAL_TABLET | Freq: Four times a day (QID) | ORAL | Status: DC | PRN
  Administered 2021-07-02 – 2021-07-08 (×21): 1 via ORAL
  Filled 2021-07-02 (×21): qty 1

## 2021-07-02 NOTE — ED Notes (Signed)
[facility] called. 

## 2021-07-02 NOTE — Progress Notes (Signed)
[facility] OF [place] PROGRESS NOTE  ASSESSMENT / PLAN: 55 y.o. 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 Will use VAC therapy to manage lymph leak in right groin - doubt infection - more likely related to bulky lymphatic metastatic and lymphadenectomy.   SUBJECTIVE: Persistent serous leak from right groin began early this morning.   OBJECTIVE: BP (!) 100/59   Pulse 70   Temp 97.6 F (36.4 C) (Oral)   Resp 12   Ht 6\' 2"  (1.88 m)   Wt 84.8 kg   SpO2 100%   BMI 24.01 kg/m   Intake/Output Summary (Last 24 hours) at 07/02/2021 0925 Last data filed at 07/02/2021 0742 Gross per 24 hour  Intake 275 ml  Output 3300 ml  Net -3025 ml    Constitutional: chronically ill appearing. no acute distress. Cardiac: RRR. Pulmonary: unlabored Abdomen: soft, not distended. Vascular: BLE well perfused. Lymph leak from R groin. R leg 1-2+ edema.  CBC Latest Ref Rng & Units 07/02/2021 07/01/2021 07/01/2021  WBC 4.0 - 10.5 K/uL 11.3(H) 15.2(H) 15.6(H)  Hemoglobin 13.0 - 17.0 g/dL 7.0(L) 7.4(L) 7.3(L)  Hematocrit 39.0 - 52.0 % 19.8(L) 21.1(L) 21.7(L)  Platelets 150 - 400 K/uL 338 337 382     CMP Latest Ref Rng & Units 07/02/2021 07/01/2021 07/01/2021  Glucose 70 - 99 mg/dL 99 75 85  BUN 6 - 20 mg/dL 11 14 15   Creatinine 0.61 - 1.24 mg/dL 1.09 1.20 1.27(H)  Sodium 135 - 145 mmol/L 120(L) 117(LL) 115(LL)  Potassium 3.5 - 5.1 mmol/L 3.8 4.0 3.9  Chloride 98 - 111 mmol/L 89(L) 87(L) 85(L)  CO2 22 - 32 mmol/L 23 18(L) 23  Calcium 8.9 - 10.3 mg/dL 7.7(L) 7.5(L) 7.4(L)  Total Protein 6.5 - 8.1 g/dL - - -  Total Bilirubin 0.3 - 1.2 mg/dL - - -  Alkaline Phos 38 - 126 U/L - - -  AST 15 - 41 U/L - - -  ALT 0 - 44  U/L - - -    Estimated Creatinine Clearance: 90.1 mL/min (by C-G formula based on SCr of 1.09 mg/dL).  Arthur Mcmahon. Stanford Breed, MD [facility] Phone Number: [phone] 07/02/2021 9:25 AM

## 2021-07-02 NOTE — Plan of Care (Signed)
  Problem: Pain Managment: Goal: General experience of comfort will improve Outcome: Progressing   

## 2021-07-02 NOTE — Progress Notes (Signed)
Subjective: Patient reports his urine has cleared up.   Objective: Vital signs in last 24 hours: Temp:  [97.4 F (36.3 C)-98 F (36.7 C)] 98 F (36.7 C) (10/29 0957) Pulse Rate:  [65-91] 79 (10/29 1200) Resp:  [12-23] 15 (10/29 1100) BP: (98-119)/(54-80) 111/74 (10/29 1200) SpO2:  [98 %-100 %] 100 % (10/29 1200) Weight:  [84.8 kg] 84.8 kg (10/28 1906)  Intake/Output from previous day: 10/28 0701 - 10/29 0700 In: 275 [Blood:275] Out: 1600 [Urine:1600] Intake/Output this shift: Total I/O In: 275 [Blood:275] Out: 1700 [Urine:1700]  Physical Exam:  NAD A&O Urine rose in tubing - no clots   Lab Results: Recent Labs    07/01/21 0655 07/01/21 1149 07/02/21 0246  HGB 7.3* 7.4* 7.0*  HCT 21.7* 21.1* 19.8*   BMET Recent Labs    07/01/21 1924 07/02/21 0609  NA 117* 120*  K 4.0 3.8  CL 87* 89*  CO2 18* 23  GLUCOSE 75 99  BUN 14 11  CREATININE 1.20 1.09  CALCIUM 7.5* 7.7*   No results for input(s): LABPT, INR in the last 72 hours. No results for input(s): LABURIN in the last 72 hours. Results for orders placed or performed during the hospital encounter of 06/30/21  Blood culture (routine x 2)     Status: Abnormal (Preliminary result)   Collection Time: 06/30/21  5:58 PM   Specimen: Site Not Specified; Blood  Result Value Ref Range Status   Specimen Description SITE NOT SPECIFIED  Final   Special Requests   Final    BOTTLES DRAWN AEROBIC AND ANAEROBIC Blood Culture results may not be optimal due to an excessive volume of blood received in culture bottles   Culture  Setup Time   Final    GRAM POSITIVE COCCI IN CHAINS AEROBIC BOTTLE ONLY CRITICAL RESULT CALLED TO, READ BACK BY AND VERIFIED WITH: PHARMD E BREWING [id number] AT 68 AM BY CM    Culture (A)  Final    STREPTOCOCCUS GROUP G SUSCEPTIBILITIES TO FOLLOW Performed at [facility], [place] [address], [place], [place] [address]    Report Status PENDING  Incomplete  Blood Culture ID Panel (Reflexed)      Status: Abnormal   Collection Time: 06/30/21  5:58 PM  Result Value Ref Range Status   Enterococcus faecalis NOT DETECTED NOT DETECTED Final   Enterococcus Faecium NOT DETECTED NOT DETECTED Final   Listeria monocytogenes NOT DETECTED NOT DETECTED Final   Staphylococcus species NOT DETECTED NOT DETECTED Final   Staphylococcus aureus (BCID) NOT DETECTED NOT DETECTED Final   Staphylococcus epidermidis NOT DETECTED NOT DETECTED Final   Staphylococcus lugdunensis NOT DETECTED NOT DETECTED Final   Streptococcus species DETECTED (A) NOT DETECTED Final    Comment: Not Enterococcus species, Streptococcus agalactiae, Streptococcus pyogenes, or Streptococcus pneumoniae. CRITICAL RESULT CALLED TO, READ BACK BY AND VERIFIED WITH: PHARMD E BREWING [id number] AT 47 AM BY CM    Streptococcus agalactiae NOT DETECTED NOT DETECTED Final   Streptococcus pneumoniae NOT DETECTED NOT DETECTED Final   Streptococcus pyogenes NOT DETECTED NOT DETECTED Final   A.calcoaceticus-baumannii NOT DETECTED NOT DETECTED Final   Bacteroides fragilis NOT DETECTED NOT DETECTED Final   Enterobacterales NOT DETECTED NOT DETECTED Final   Enterobacter cloacae complex NOT DETECTED NOT DETECTED Final   Escherichia coli NOT DETECTED NOT DETECTED Final   Klebsiella aerogenes NOT DETECTED NOT DETECTED Final   Klebsiella oxytoca NOT DETECTED NOT DETECTED Final   Klebsiella pneumoniae NOT DETECTED NOT DETECTED Final   Proteus species  NOT DETECTED NOT DETECTED Final   Salmonella species NOT DETECTED NOT DETECTED Final   Serratia marcescens NOT DETECTED NOT DETECTED Final   Haemophilus influenzae NOT DETECTED NOT DETECTED Final   Neisseria meningitidis NOT DETECTED NOT DETECTED Final   Pseudomonas aeruginosa NOT DETECTED NOT DETECTED Final   Stenotrophomonas maltophilia NOT DETECTED NOT DETECTED Final   Candida albicans NOT DETECTED NOT DETECTED Final   Candida auris NOT DETECTED NOT DETECTED Final   Candida glabrata NOT DETECTED NOT  DETECTED Final   Candida krusei NOT DETECTED NOT DETECTED Final   Candida parapsilosis NOT DETECTED NOT DETECTED Final   Candida tropicalis NOT DETECTED NOT DETECTED Final   Cryptococcus neoformans/gattii NOT DETECTED NOT DETECTED Final    Comment: Performed at [facility], [place] [address], [place], [place] [address]  Blood culture (routine x 2)     Status: None (Preliminary result)   Collection Time: 07/01/21 12:16 AM   Specimen: BLOOD RIGHT FOREARM  Result Value Ref Range Status   Specimen Description BLOOD RIGHT FOREARM  Final   Special Requests   Final    BOTTLES DRAWN AEROBIC AND ANAEROBIC Blood Culture adequate volume   Culture   Final    NO GROWTH 1 DAY Performed at [facility], [place] [address], [place], [place] [address]    Report Status PENDING  Incomplete  Resp Panel by RT-PCR (Flu A&B, Covid) Nasopharyngeal Swab     Status: None   Collection Time: 07/01/21 12:21 AM   Specimen: Nasopharyngeal Swab; Nasopharyngeal(NP) swabs in vial transport medium  Result Value Ref Range Status   SARS Coronavirus 2 by RT PCR NEGATIVE NEGATIVE Final    Comment: (NOTE) SARS-CoV-2 target nucleic acids are NOT DETECTED.  The SARS-CoV-2 RNA is generally detectable in upper respiratory specimens during the acute phase of infection. The lowest concentration of SARS-CoV-2 viral copies this assay can detect is 138 copies/mL. A negative result does not preclude SARS-Cov-2 infection and should not be used as the sole basis for treatment or other patient management decisions. A negative result may occur with  improper specimen collection/handling, submission of specimen other than nasopharyngeal swab, presence of viral mutation(s) within the areas targeted by this assay, and inadequate number of viral copies(<138 copies/mL). A negative result must be combined with clinical observations, patient history, and epidemiological information. The expected result is Negative.  Fact Sheet for  Patients:  EntrepreneurPulse.com.au  Fact Sheet for Healthcare Providers:  IncredibleEmployment.be  This test is no t yet approved or cleared by the [place] FDA and  has been authorized for detection and/or diagnosis of SARS-CoV-2 by FDA under an Emergency Use Authorization (EUA). This EUA will remain  in effect (meaning this test can be used) for the duration of the COVID-19 declaration under Section 564(b)(1) of the Act, 21 U.S.C.section 360bbb-3(b)(1), unless the authorization is terminated  or revoked sooner.       Influenza A by PCR NEGATIVE NEGATIVE Final   Influenza B by PCR NEGATIVE NEGATIVE Final    Comment: (NOTE) The Xpert Xpress SARS-CoV-2/FLU/RSV plus assay is intended as an aid in the diagnosis of influenza from Nasopharyngeal swab specimens and should not be used as a sole basis for treatment. Nasal washings and aspirates are unacceptable for Xpert Xpress SARS-CoV-2/FLU/RSV testing.  Fact Sheet for Patients: EntrepreneurPulse.com.au  Fact Sheet for Healthcare Providers: IncredibleEmployment.be  This test is not yet approved or cleared by the [place] FDA and has been authorized for detection and/or diagnosis of  SARS-CoV-2 by FDA under an Emergency Use Authorization (EUA). This EUA will remain in effect (meaning this test can be used) for the duration of the COVID-19 declaration under Section 564(b)(1) of the Act, 21 U.S.C. section 360bbb-3(b)(1), unless the authorization is terminated or revoked.  Performed at [facility], [place] [address], [place], [place] [address]     Studies/Results: DG Chest 2 View  Result Date: 06/30/2021 CLINICAL DATA:  Possible sepsis EXAM: CHEST - 2 VIEW COMPARISON:  03/22/2021 FINDINGS: Heart and mediastinal contours are within normal limits. No focal opacities or effusions. No acute bony abnormality. IMPRESSION: No active cardiopulmonary  disease. Electronically Signed   By: Rolm Baptise M.D.   On: 06/30/2021 18:58   CT CHEST W CONTRAST  Result Date: 07/01/2021 CLINICAL DATA:  Gross hematuria, shortness of breath, urologic cancer, staging EXAM: CT CHEST WITH CONTRAST TECHNIQUE: Multidetector CT imaging of the chest was performed during intravenous contrast administration. CONTRAST:  128mL OMNIPAQUE IOHEXOL 350 MG/ML SOLN COMPARISON:  06/18/2021 FINDINGS: Cardiovascular: The heart and great vessels are unremarkable without pericardial effusion. Extensive atherosclerosis of the coronary vasculature. No evidence of thoracic aortic aneurysm or dissection. While not optimized for opacification of the pulmonary vasculature, there is sufficient contrast enhancement to exclude pulmonary emboli. No filling defects. Mediastinum/Nodes: Extensive pathologic adenopathy is seen within the left supraclavicular region. Largest lymph node mass on image 11/3 measures 1.7 x 3.5 cm. Additionally, there are numerous pathologic lymph nodes within the left axilla, largest measuring up to 1.5 cm in short axis reference image 23/3. Pathologic adenopathy is seen within the left para-aortic region, largest lymph node measuring 1.3 cm in short axis reference image 135/3. No pathologic adenopathy within the mediastinum, hila, or right axilla. The thyroid, trachea, and esophagus are unremarkable. Lungs/Pleura: No acute airspace disease, effusion, or pneumothorax. Mild right lower lobe bronchial wall thickening and mucous plugging. Remaining central airways are patent. No pulmonary nodules or masses. Upper Abdomen: Enlarged right retrocrural lymph node measures 1.1 cm in short axis, reference image 154/3. Subcentimeter retrocaval lymph nodes are also noted. Findings are consistent with metastatic disease, please refer to CT abdomen report. Musculoskeletal: There are no acute or destructive bony lesions. Reconstructed images demonstrate no additional findings. IMPRESSION: 1.  Pathologic adenopathy within the left supraclavicular region, left axilla, and left para-aortic region, consistent with metastatic disease given patient's known history of urologic malignancy. 2. Borderline enlarged lymph nodes within the retrocrural region and retroperitoneum, consistent with metastatic disease. Please refer to CT abdomen report for full description. 3. Minimal right lower lobe bronchial wall thickening and mucous plugging, which may reflect reactive airway disease. No acute airspace disease. 4.  Aortic Atherosclerosis (ICD10-I70.0). Electronically Signed   By: Randa Ngo M.D.   On: 07/01/2021 17:39   VAS [place] ABI WITH/WO TBI  Result Date: 06/30/2021  LOWER EXTREMITY DOPPLER STUDY Patient Name:  Arthur Mcmahon  Date of Exam:   06/30/2021 Medical Rec #: [id number]       Accession #:    [id number] Date of Birth: Dec 15, 1965       Patient Gender: M Patient Age:   54 years Exam Location:  [facility] Procedure:      VAS [place] ABI WITH/WO TBI Referring Phys: Aldona Bar RHYNE --------------------------------------------------------------------------------  Indications: Claudication, rest pain, and peripheral artery disease. High Risk Factors: Hypertension, current smoker.  Vascular Interventions: 06/19/2021 Thrombectomy of right popliteal artery  06/18/2021 Right femoral endarterectomy , thrombectomy                         and fasciotomy                         04/11/2021 Left femoral endarterectomy. Performing Technologist: Delorise Shiner RVT  Examination Guidelines: A complete evaluation includes at minimum, Doppler waveform signals and systolic blood pressure reading at the level of bilateral brachial, anterior tibial, and posterior tibial arteries, when vessel segments are accessible. Bilateral testing is considered an integral part of a complete examination. Photoelectric Plethysmograph (PPG) waveforms and toe systolic pressure readings are included as  required and additional duplex testing as needed. Limited examinations for reoccurring indications may be performed as noted.  ABI Findings: +---------+------------------+-----+----------+--------+ Right    Rt Pressure (mmHg)IndexWaveform  Comment  +---------+------------------+-----+----------+--------+ Brachial 141                                       +---------+------------------+-----+----------+--------+ PTA      100               0.71 monophasic         +---------+------------------+-----+----------+--------+ PERO                            triphasic          +---------+------------------+-----+----------+--------+ DP       0                 0.00 absent             +---------+------------------+-----+----------+--------+ Great Toe0                 0.00 Absent             +---------+------------------+-----+----------+--------+ +---------+------------------+-----+---------+-------+ Left     Lt Pressure (mmHg)IndexWaveform Comment +---------+------------------+-----+---------+-------+ Brachial 141                                     +---------+------------------+-----+---------+-------+ PTA      202               1.43 triphasic        +---------+------------------+-----+---------+-------+ DP       177               1.26 biphasic         +---------+------------------+-----+---------+-------+ Great Toe0                 0.00                  +---------+------------------+-----+---------+-------+ +-------+-----------+-----------+------------+------------+ ABI/TBIToday's ABIToday's TBIPrevious ABIPrevious TBI +-------+-----------+-----------+------------+------------+ Right  0.71       0          0.62        0.43         +-------+-----------+-----------+------------+------------+ Left   1.43       0          1.32        0.46         +-------+-----------+-----------+------------+------------+ Bilateral ABIs appear essentially  unchanged compared to prior study on 05/31/2021.  Summary: Right: Resting right ankle-brachial index indicates moderate right lower extremity arterial disease. The right  toe-brachial index is abnormal. Left: Resting left ankle-brachial index indicates noncompressible left lower extremity arteries. The left toe-brachial index is abnormal.  *See table(s) above for measurements and observations.  Electronically signed by Deitra Mayo MD on 06/30/2021 at 3:28:33 PM.    Final    CT HEMATURIA WORKUP  Result Date: 07/01/2021 CLINICAL DATA:  Gross hematuria. EXAM: CT ABDOMEN AND PELVIS WITHOUT AND WITH CONTRAST TECHNIQUE: Multidetector CT imaging of the abdomen and pelvis was performed following the standard protocol before and following the bolus administration of intravenous contrast. CONTRAST:  150mL OMNIPAQUE IOHEXOL 350 MG/ML SOLN COMPARISON:  None. FINDINGS: Lower chest: The lung bases are clear of acute process. No pleural effusion or pulmonary lesions. The heart is normal in size. No pericardial effusion. The distal esophagus and aorta are unremarkable. Hepatobiliary: No hepatic lesions to suggest metastatic disease. No intrahepatic biliary dilatation. The gallbladder is unremarkable. No common bile duct dilatation. Pancreas: No mass, inflammation or ductal dilatation. Spleen: Normal size.  No focal lesions. Adrenals/Urinary Tract: The adrenal glands are unremarkable. Left renal artery calcifications but no definite renal calculi. There is a simple left renal cyst noted. The right kidney demonstrates marked hydronephrosis and there is also marked right hydroureter down into the pelvis. High attenuation material noted in distal third of the right ureter which could be blood or tumor. No right renal lesions are identified. Decreased perfusion because of the obstruction. There is a large irregular right-sided bladder mass likely obstructing the right ureter. The left ureter is patent. Associated extensive  pelvic lymphadenopathy the. Stomach/Bowel: The stomach, duodenum, small bowel and colon are grossly normal. Vascular/Lymphatic: Age advanced vascular disease but no aneurysm or dissection. Extensive retroperitoneal and pelvic adenopathy as demonstrated on the recent study from 06/18/2021. Largest nodal mass is in the right external iliac region and measures 4.9 cm. The right pelvic sidewall adenopathy measures 2.5 cm. Partially necrotic retroperitoneal adenopathy surrounding the aorta. The largest measures 18 mm on image 39/8. Reproductive: The prostate gland and seminal vesicles are unremarkable. Other: There is diffuse lower pelvic body wall edema asymmetric on the right side and also bobbing the upper thigh. This is most likely due to compression of the right femoral vein due to the bulk E right femoral adenopathy measuring a maximum of 3 cm on image 80/8 Musculoskeletal: No findings suspicious for osseous metastatic disease. IMPRESSION: 1. Large (7 cm) irregular right-sided bladder mass likely obstructing the right ureter with marked right-sided hydroureteronephrosis. High attenuation material in the distal right ureter could be blood or tumor. 2. Extensive retroperitoneal and pelvic adenopathy. 3. No findings suspicious for metastatic disease involving the lung bases, liver or bony structures. 4. Age advanced vascular disease. 5. Compression of the right common femoral vein by adenopathy causing right lower extremity swelling/edema. Electronically Signed   By: Marijo Sanes M.D.   On: 07/01/2021 18:01   VAS [place] LOWER EXTREMITY ARTERIAL DUPLEX  Result Date: 06/30/2021 LOWER EXTREMITY ARTERIAL DUPLEX STUDY Patient Name:  Arthur Mcmahon  Date of Exam:   06/30/2021 Medical Rec #: [id number]       Accession #:    [id number] Date of Birth: Feb 06, 1966       Patient Gender: M Patient Age:   55 years Exam Location:  [facility] Procedure:      VAS [place] LOWER EXTREMITY ARTERIAL DUPLEX Referring Phys:  Aldona Bar RHYNE --------------------------------------------------------------------------------  Indications: Claudication, rest pain, and peripheral artery disease. High Risk Factors: Hypertension, current smoker.  Vascular Interventions: 06/19/2021 Thrombectomy of  right popliteal artery                         06/18/2021 Right femoral endarterectomy , thrombectomy                         and fasciotomy                         04/11/2021 Left femoral endarterectomy. Current ABI:            Right: 0.71, Left: 1.43 Performing Technologist: Delorise Shiner RVT  Examination Guidelines: A complete evaluation includes B-mode imaging, spectral Doppler, color Doppler, and power Doppler as needed of all accessible portions of each vessel. Bilateral testing is considered an integral part of a complete examination. Limited examinations for reoccurring indications may be performed as noted.  +----------+--------+-----+--------+----------+---------+ RIGHT     PSV cm/sRatioStenosisWaveform  Comments  +----------+--------+-----+--------+----------+---------+ CFA Distal80                   triphasic           +----------+--------+-----+--------+----------+---------+ SFA Prox  119                  triphasic           +----------+--------+-----+--------+----------+---------+ SFA Mid   109                  triphasic           +----------+--------+-----+--------+----------+---------+ SFA Distal165                  triphasic           +----------+--------+-----+--------+----------+---------+ POP Prox  43                   triphasic           +----------+--------+-----+--------+----------+---------+ POP Distal152                  triphasic           +----------+--------+-----+--------+----------+---------+ PTA Distal12                   monophasicHyperemic +----------+--------+-----+--------+----------+---------+ Hypoechoic structure seen at right groin with blood flow detected  within, consistent with partially thrombosed pseudo-aneurysm.  Summary: Right: No stenosis identified from the common femoral artery to the popliteal space. Hypoechoic structure seen at inguinal space. Calf imaging limited by bandages and recent surgical sites.  See table(s) above for measurements and observations. Electronically signed by Deitra Mayo MD on 06/30/2021 at 3:28:45 PM.    Final    VAS [place] LOWER EXTREMITY VENOUS (DVT)  Result Date: 06/30/2021  Lower Venous DVT Study Patient Name:  GEDDY BOYDSTUN  Date of Exam:   06/30/2021 Medical Rec #: [id number]       Accession #:    [id number] Date of Birth: 1966-03-05       Patient Gender: M Patient Age:   18 years Exam Location:  [facility] Procedure:      VAS [place] LOWER EXTREMITY VENOUS (DVT) Referring Phys: Aldona Bar RHYNE --------------------------------------------------------------------------------  Indications: Pain, Swelling, and Edema.  Risk Factors: Surgery 06/19/2021 Thrombectomy of right popliteal artery. Limitations: Bandages and Patient discomfort. Performing Technologist: Delorise Shiner RVT  Examination Guidelines: A complete evaluation includes B-mode imaging, spectral Doppler, color Doppler, and power Doppler as needed of all accessible portions of each vessel. Bilateral testing is considered  an integral part of a complete examination. Limited examinations for reoccurring indications may be performed as noted. The reflux portion of the exam is performed with the patient in reverse Trendelenburg.  +---------+---------------+---------+-----------+----------+--------------+ RIGHT    CompressibilityPhasicitySpontaneityPropertiesThrombus Aging +---------+---------------+---------+-----------+----------+--------------+ CFV      Full           No       Yes                                 +---------+---------------+---------+-----------+----------+--------------+ SFJ      Full           No       Yes                                  +---------+---------------+---------+-----------+----------+--------------+ FV Prox  Full           No       Yes                                 +---------+---------------+---------+-----------+----------+--------------+ FV Mid   Full           No       Yes                                 +---------+---------------+---------+-----------+----------+--------------+ FV DistalFull           No       Yes                                 +---------+---------------+---------+-----------+----------+--------------+ POP      Full           No       Yes                                 +---------+---------------+---------+-----------+----------+--------------+ PTV      Full                                                        +---------+---------------+---------+-----------+----------+--------------+ GSV      Full                                                        +---------+---------------+---------+-----------+----------+--------------+ SSV      Full                                                        +---------+---------------+---------+-----------+----------+--------------+ Large hypoechoic structure seen in right inguinal space with blood flow detected within. Little respiratory variation was noted. Limited imaging showed a patent distal IVC  +----+---------------+---------+-----------+----------+--------------+ LEFTCompressibilityPhasicitySpontaneityPropertiesThrombus Aging +----+---------------+---------+-----------+----------+--------------+ CFV Full  No       Yes                                 +----+---------------+---------+-----------+----------+--------------+ No respiratory variation noted at the left common femoral vein.    Summary: RIGHT: - There is no evidence of deep vein thrombosis in the lower extremity. - There is no evidence of superficial venous thrombosis.  - No cystic structure found in the popliteal  fossa. - Non phasic Doppler signal may be consistent with proximal venous obstruction/ extrinsic compression.  LEFT: - No evidence of common femoral vein obstruction.  *See table(s) above for measurements and observations. Electronically signed by Deitra Mayo MD on 06/30/2021 at 3:28:19 PM.    Final     Assessment/Plan: 79yM with presumed [id number] bladder cancer with large bladder mass and bulky RP, pelvic, iliac lymphadenopathy seen on recent CT A/P.  No bone lesions on CT yesterday.  Extensive retroperitoneal and pelvic lymphadenopathy with right hydronephrosis from right bladder mass.  Chest clear.  Planning TURBT for tissue diagnosis on Monday afternoon. NPO after midnight Sunday night. Holding anticoagulation per vascular. Blood txfn this morning.    LOS: 2 days   Festus Aloe 07/02/2021, 12:32 PM

## 2021-07-02 NOTE — Progress Notes (Addendum)
PROGRESS NOTE    Arthur Mcmahon  IFO:[id number] DOB: 07-31-1966 DOA: 06/30/2021  PCP: Shary Key, DO   Brief Narrative:   This 55 years old Male with history of recent endarterectomy and fistulotomy with thrombectomy for critical right limb ischemia, presently on Eliquis, had followed up with urology clinic for bladder mass, was sent in the ED for hematuria , generalized weakness and lethargy.  Patient has intermittent hematuria for last few months.  Urology and vascular surgery is consulted, recommended to hold anticoagulation due to ongoing severe hematuria and drop in hemoglobin.  Urology is planning TURBT for tissue diagnosis most likely on Monday.  Vascular surgery recommended to hold anticoagulation.  Nephrology consulted for hyponatremia.  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   Nephro recommended fluid restriction.   Unable to do urine sodium and osmolality due to significant hematuria.    Hematuria with bladder mass: Patient following up with urology, patient will eventually need a procedure. Discussed with vascular surgery recommended to hold anticoagulation. Urology is planning TURBT for tissue diagnosis most likely on Monday Hemoglobin remained stable.  Urine is getting clear.  History of critical limb ischemia: Patient underwent  recent endarterectomy and fistulotomy with thrombectomy. Patient was on aspirin and Eliquis. Discussed with vascular surgery recommended to hold anticoagulation  Acute blood loss anemia: This could be secondary to ongoing hematuria. S/p 1 PRBC posttransfusion hemoglobin 8.6. Continue to monitor H&H.  AKI on CKD: Renal functions back to  baseline. Continue to monitor renal functions.  Leukocytosis with lactic acidosis: Continue ceftriaxone for possible UTI. Follow blood and urine cultures.  Prior history of alcohol abuse: Patient reported He has not drink alcohol in the last 6 months.  Tobacco abuse: Counseling.  DVT prophylaxis:SCDs Code Status: Full code. Family Communication: No family at bed side. 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  Anticipated discharge home in few days     Consultants:  Urology Vascular surgery.  Procedures: None Antimicrobials:  Anti-infectives (From admission, onward)    Start     Dose/Rate Route Frequency Ordered Stop   07/02/21 0500  cefTRIAXone (ROCEPHIN) 2 g in sodium chloride 0.9 % 100 mL IVPB  Status:  Discontinued        2 g 200 mL/hr over 30 Minutes Intravenous Every 24 hours 07/01/21 0839 07/01/21 0843   07/01/21 1000  cefTRIAXone (ROCEPHIN) 2 g in sodium chloride 0.9 % 100 mL IVPB        2 g 200 mL/hr over 30 Minutes Intravenous Daily 07/01/21 0843     07/01/21 0500  cefTRIAXone (ROCEPHIN) 1 g in sodium chloride 0.9 % 100 mL IVPB  Status:  Discontinued        1 g 200 mL/hr over 30 Minutes Intravenous Every 24 hours 07/01/21 0414 07/01/21 0839        Subjective: Seen and examined at bedside.  Overnight events noted.  Patient reports hematuria is getting better.  He reports pain is improving.  Objective: Vitals:   07/02/21 1100 07/02/21 1200 07/02/21 1230 07/02/21 1400  BP: 104/62 111/74  110/70  Pulse:  79 68 73  Resp: 15 17  16 20  Temp:      TempSrc:      SpO2:  100% 100% 98%  Weight:      Height:        Intake/Output Summary (Last 24 hours) at 07/02/2021 1454 Last data filed at 07/02/2021 0917 Gross per 24 hour  Intake 550 ml  Output 3300 ml  Net -2750 ml   Filed Weights   07/01/21 1906  Weight: 84.8 kg    Examination:  General exam: Appears comfortable,  not in any acute distress.  Deconditioned Respiratory system: Clear to auscultation. Respiratory effort normal. Cardiovascular system: S1-S2 heard, regular rate and rhythm, no murmur. Gastrointestinal system: Abdomen is nondistended, soft and nontender. No organomegaly or masses felt. Normal bowel sounds heard. Central nervous system: Alert and oriented. No focal neurological deficits. Extremities: No edema, no cyanosis, no clubbing. Skin: No rashes, lesions or ulcers Psychiatry: Judgement and insight appear normal. Mood & affect appropriate.     Data Reviewed: I have personally reviewed following labs and imaging studies  CBC: Recent Labs  Lab 06/30/21 1758 07/01/21 0412 07/01/21 0655 07/01/21 1149 07/02/21 0246 07/02/21 1209  WBC 26.1* 16.3* 15.6* 15.2* 11.3*  --   NEUTROABS 24.0*  --   --   --   --   --   HGB 9.0* 7.6* 7.3* 7.4* 7.0* 8.6*  HCT 26.2* 21.3* 21.7* 21.1* 19.8* 25.5*  MCV 94.2 93.8 95.2 93.4 93.4  --   PLT 498* 389 382 337 338  --    Basic Metabolic Panel: Recent Labs  Lab 07/01/21 1149 07/01/21 1524 07/01/21 1924 07/02/21 0246 07/02/21 0609 07/02/21 1209  NA 121* 115* 117*  --  120* 121*  K 4.1 3.9 4.0  --  3.8 4.1  CL 89* 85* 87*  --  89* 88*  CO2 22 23 18*  --  23 24  GLUCOSE 90 85 75  --  99 86  BUN 15 15 14   --  11 11  CREATININE 1.14 1.27* 1.20  --  1.09 1.07  CALCIUM 7.7* 7.4* 7.5*  --  7.7* 7.7*  MG  --   --   --  2.0  --   --   PHOS  --   --   --  2.2*  --   --    GFR: Estimated Creatinine Clearance: 91.8 mL/min (by C-G formula based on SCr of 1.07 mg/dL). Liver Function Tests: Recent Labs  Lab 06/30/21 1758  AST 21  ALT 13  ALKPHOS 85  BILITOT 1.3*  PROT 7.2  ALBUMIN 2.4*   No results for input(s): LIPASE, AMYLASE in the last 168 hours. No results for input(s): AMMONIA in the last 168 hours. Coagulation Profile: No results for input(s): INR, PROTIME in the last 168 hours. Cardiac Enzymes: No results for input(s): CKTOTAL,  CKMB, CKMBINDEX, TROPONINI in the last 168 hours. BNP (last 3 results) No results for input(s): PROBNP in the last 8760 hours. HbA1C: No results for input(s): HGBA1C in the last 72 hours. CBG: No results for input(s): GLUCAP in the last 168 hours. Lipid Profile: No results for input(s): CHOL, HDL, LDLCALC, TRIG, CHOLHDL, LDLDIRECT in the last 72 hours. Thyroid Function Tests: Recent Labs    07/01/21 0412  TSH 13.445*   Anemia Panel: No results for input(s): VITAMINB12, FOLATE, FERRITIN, TIBC, IRON, RETICCTPCT in the last 72 hours. Sepsis Labs: Recent Labs  Lab 06/30/21 1758 07/01/21 1149  LATICACIDVEN 2.9* 1.5    Recent Results (from the past 240  hour(s))  Blood culture (routine x 2)     Status: Abnormal (Preliminary result)   Collection Time: 06/30/21  5:58 PM   Specimen: Site Not Specified; Blood  Result Value Ref Range Status   Specimen Description SITE NOT SPECIFIED  Final   Special Requests   Final    BOTTLES DRAWN AEROBIC AND ANAEROBIC Blood Culture results may not be optimal due to an excessive volume of blood received in culture bottles   Culture  Setup Time   Final    GRAM POSITIVE COCCI IN CHAINS AEROBIC BOTTLE ONLY CRITICAL RESULT CALLED TO, READ BACK BY AND VERIFIED WITH: PHARMD E BREWING [id number] AT 37 AM BY CM    Culture (A)  Final    STREPTOCOCCUS GROUP G SUSCEPTIBILITIES TO FOLLOW Performed at [facility], [place] [address], [place], [place] [address]    Report Status PENDING  Incomplete  Blood Culture ID Panel (Reflexed)     Status: Abnormal   Collection Time: 06/30/21  5:58 PM  Result Value Ref Range Status   Enterococcus faecalis NOT DETECTED NOT DETECTED Final   Enterococcus Faecium NOT DETECTED NOT DETECTED Final   Listeria monocytogenes NOT DETECTED NOT DETECTED Final   Staphylococcus species NOT DETECTED NOT DETECTED Final   Staphylococcus aureus (BCID) NOT DETECTED NOT DETECTED Final   Staphylococcus epidermidis NOT DETECTED NOT DETECTED  Final   Staphylococcus lugdunensis NOT DETECTED NOT DETECTED Final   Streptococcus species DETECTED (A) NOT DETECTED Final    Comment: Not Enterococcus species, Streptococcus agalactiae, Streptococcus pyogenes, or Streptococcus pneumoniae. CRITICAL RESULT CALLED TO, READ BACK BY AND VERIFIED WITH: PHARMD E BREWING [id number] AT 27 AM BY CM    Streptococcus agalactiae NOT DETECTED NOT DETECTED Final   Streptococcus pneumoniae NOT DETECTED NOT DETECTED Final   Streptococcus pyogenes NOT DETECTED NOT DETECTED Final   A.calcoaceticus-baumannii NOT DETECTED NOT DETECTED Final   Bacteroides fragilis NOT DETECTED NOT DETECTED Final   Enterobacterales NOT DETECTED NOT DETECTED Final   Enterobacter cloacae complex NOT DETECTED NOT DETECTED Final   Escherichia coli NOT DETECTED NOT DETECTED Final   Klebsiella aerogenes NOT DETECTED NOT DETECTED Final   Klebsiella oxytoca NOT DETECTED NOT DETECTED Final   Klebsiella pneumoniae NOT DETECTED NOT DETECTED Final   Proteus species NOT DETECTED NOT DETECTED Final   Salmonella species NOT DETECTED NOT DETECTED Final   Serratia marcescens NOT DETECTED NOT DETECTED Final   Haemophilus influenzae NOT DETECTED NOT DETECTED Final   Neisseria meningitidis NOT DETECTED NOT DETECTED Final   Pseudomonas aeruginosa NOT DETECTED NOT DETECTED Final   Stenotrophomonas maltophilia NOT DETECTED NOT DETECTED Final   Candida albicans NOT DETECTED NOT DETECTED Final   Candida auris NOT DETECTED NOT DETECTED Final   Candida glabrata NOT DETECTED NOT DETECTED Final   Candida krusei NOT DETECTED NOT DETECTED Final   Candida parapsilosis NOT DETECTED NOT DETECTED Final   Candida tropicalis NOT DETECTED NOT DETECTED Final   Cryptococcus neoformans/gattii NOT DETECTED NOT DETECTED Final    Comment: Performed at [facility], [address] [place], [place] [address]  Blood culture (routine x 2)     Status: None (Preliminary result)   Collection Time: 07/01/21 12:16 AM    Specimen: BLOOD RIGHT FOREARM  Result Value Ref Range Status   Specimen Description BLOOD RIGHT FOREARM  Final   Special Requests   Final    BOTTLES DRAWN AEROBIC AND ANAEROBIC Blood Culture adequate volume   Culture   Final  NO GROWTH 1 DAY Performed at [facility], [place] [address] [place], [place] [address]    Report Status PENDING  Incomplete  Resp Panel by RT-PCR (Flu A&B, Covid) Nasopharyngeal Swab     Status: None   Collection Time: 07/01/21 12:21 AM   Specimen: Nasopharyngeal Swab; Nasopharyngeal(NP) swabs in vial transport medium  Result Value Ref Range Status   SARS Coronavirus 2 by RT PCR NEGATIVE NEGATIVE Final    Comment: (NOTE) SARS-CoV-2 target nucleic acids are NOT DETECTED.  The SARS-CoV-2 RNA is generally detectable in upper respiratory specimens during the acute phase of infection. The lowest concentration of SARS-CoV-2 viral copies this assay can detect is 138 copies/mL. A negative result does not preclude SARS-Cov-2 infection and should not be used as the sole basis for treatment or other patient management decisions. A negative result may occur with  improper specimen collection/handling, submission of specimen other than nasopharyngeal swab, presence of viral mutation(s) within the areas targeted by this assay, and inadequate number of viral copies(<138 copies/mL). A negative result must be combined with clinical observations, patient history, and epidemiological information. The expected result is Negative.  Fact Sheet for Patients:  EntrepreneurPulse.com.au  Fact Sheet for Healthcare Providers:  IncredibleEmployment.be  This test is no t yet approved or cleared by the [place] FDA and  has been authorized for detection and/or diagnosis of SARS-CoV-2 by FDA under an Emergency Use Authorization (EUA). This EUA will remain  in effect (meaning this test can be used) for the duration of the COVID-19  declaration under Section 564(b)(1) of the Act, 21 U.S.C.section 360bbb-3(b)(1), unless the authorization is terminated  or revoked sooner.       Influenza A by PCR NEGATIVE NEGATIVE Final   Influenza B by PCR NEGATIVE NEGATIVE Final    Comment: (NOTE) The Xpert Xpress SARS-CoV-2/FLU/RSV plus assay is intended as an aid in the diagnosis of influenza from Nasopharyngeal swab specimens and should not be used as a sole basis for treatment. Nasal washings and aspirates are unacceptable for Xpert Xpress SARS-CoV-2/FLU/RSV testing.  Fact Sheet for Patients: EntrepreneurPulse.com.au  Fact Sheet for Healthcare Providers: IncredibleEmployment.be  This test is not yet approved or cleared by the [place] FDA and has been authorized for detection and/or diagnosis of SARS-CoV-2 by FDA under an Emergency Use Authorization (EUA). This EUA will remain in effect (meaning this test can be used) for the duration of the COVID-19 declaration under Section 564(b)(1) of the Act, 21 U.S.C. section 360bbb-3(b)(1), unless the authorization is terminated or revoked.  Performed at [facility], [place] [address] [place], [place] [address]     Radiology Studies: DG Chest 2 View  Result Date: 06/30/2021 CLINICAL DATA:  Possible sepsis EXAM: CHEST - 2 VIEW COMPARISON:  03/22/2021 FINDINGS: Heart and mediastinal contours are within normal limits. No focal opacities or effusions. No acute bony abnormality. IMPRESSION: No active cardiopulmonary disease. Electronically Signed   By: Rolm Baptise M.D.   On: 06/30/2021 18:58   CT CHEST W CONTRAST  Result Date: 07/01/2021 CLINICAL DATA:  Gross hematuria, shortness of breath, urologic cancer, staging EXAM: CT CHEST WITH CONTRAST TECHNIQUE: Multidetector CT imaging of the chest was performed during intravenous contrast administration. CONTRAST:  170mL OMNIPAQUE IOHEXOL 350 MG/ML SOLN COMPARISON:  06/18/2021 FINDINGS:  Cardiovascular: The heart and great vessels are unremarkable without pericardial effusion. Extensive atherosclerosis of the coronary vasculature. No evidence of thoracic aortic aneurysm or dissection. While not optimized for opacification of the pulmonary vasculature, there  is sufficient contrast enhancement to exclude pulmonary emboli. No filling defects. Mediastinum/Nodes: Extensive pathologic adenopathy is seen within the left supraclavicular region. Largest lymph node mass on image 11/3 measures 1.7 x 3.5 cm. Additionally, there are numerous pathologic lymph nodes within the left axilla, largest measuring up to 1.5 cm in short axis reference image 23/3. Pathologic adenopathy is seen within the left para-aortic region, largest lymph node measuring 1.3 cm in short axis reference image 135/3. No pathologic adenopathy within the mediastinum, hila, or right axilla. The thyroid, trachea, and esophagus are unremarkable. Lungs/Pleura: No acute airspace disease, effusion, or pneumothorax. Mild right lower lobe bronchial wall thickening and mucous plugging. Remaining central airways are patent. No pulmonary nodules or masses. Upper Abdomen: Enlarged right retrocrural lymph node measures 1.1 cm in short axis, reference image 154/3. Subcentimeter retrocaval lymph nodes are also noted. Findings are consistent with metastatic disease, please refer to CT abdomen report. Musculoskeletal: There are no acute or destructive bony lesions. Reconstructed images demonstrate no additional findings. IMPRESSION: 1. Pathologic adenopathy within the left supraclavicular region, left axilla, and left para-aortic region, consistent with metastatic disease given patient's known history of urologic malignancy. 2. Borderline enlarged lymph nodes within the retrocrural region and retroperitoneum, consistent with metastatic disease. Please refer to CT abdomen report for full description. 3. Minimal right lower lobe bronchial wall thickening and  mucous plugging, which may reflect reactive airway disease. No acute airspace disease. 4.  Aortic Atherosclerosis (ICD10-I70.0). Electronically Signed   By: Randa Ngo M.D.   On: 07/01/2021 17:39   CT HEMATURIA WORKUP  Result Date: 07/01/2021 CLINICAL DATA:  Gross hematuria. EXAM: CT ABDOMEN AND PELVIS WITHOUT AND WITH CONTRAST TECHNIQUE: Multidetector CT imaging of the abdomen and pelvis was performed following the standard protocol before and following the bolus administration of intravenous contrast. CONTRAST:  172mL OMNIPAQUE IOHEXOL 350 MG/ML SOLN COMPARISON:  None. FINDINGS: Lower chest: The lung bases are clear of acute process. No pleural effusion or pulmonary lesions. The heart is normal in size. No pericardial effusion. The distal esophagus and aorta are unremarkable. Hepatobiliary: No hepatic lesions to suggest metastatic disease. No intrahepatic biliary dilatation. The gallbladder is unremarkable. No common bile duct dilatation. Pancreas: No mass, inflammation or ductal dilatation. Spleen: Normal size.  No focal lesions. Adrenals/Urinary Tract: The adrenal glands are unremarkable. Left renal artery calcifications but no definite renal calculi. There is a simple left renal cyst noted. The right kidney demonstrates marked hydronephrosis and there is also marked right hydroureter down into the pelvis. High attenuation material noted in distal third of the right ureter which could be blood or tumor. No right renal lesions are identified. Decreased perfusion because of the obstruction. There is a large irregular right-sided bladder mass likely obstructing the right ureter. The left ureter is patent. Associated extensive pelvic lymphadenopathy the. Stomach/Bowel: The stomach, duodenum, small bowel and colon are grossly normal. Vascular/Lymphatic: Age advanced vascular disease but no aneurysm or dissection. Extensive retroperitoneal and pelvic adenopathy as demonstrated on the recent study from  06/18/2021. Largest nodal mass is in the right external iliac region and measures 4.9 cm. The right pelvic sidewall adenopathy measures 2.5 cm. Partially necrotic retroperitoneal adenopathy surrounding the aorta. The largest measures 18 mm on image 39/8. Reproductive: The prostate gland and seminal vesicles are unremarkable. Other: There is diffuse lower pelvic body wall edema asymmetric on the right side and also bobbing the upper thigh. This is most likely due to compression of the right femoral vein due to the bulk  E right femoral adenopathy measuring a maximum of 3 cm on image 80/8 Musculoskeletal: No findings suspicious for osseous metastatic disease. IMPRESSION: 1. Large (7 cm) irregular right-sided bladder mass likely obstructing the right ureter with marked right-sided hydroureteronephrosis. High attenuation material in the distal right ureter could be blood or tumor. 2. Extensive retroperitoneal and pelvic adenopathy. 3. No findings suspicious for metastatic disease involving the lung bases, liver or bony structures. 4. Age advanced vascular disease. 5. Compression of the right common femoral vein by adenopathy causing right lower extremity swelling/edema. Electronically Signed   By: Marijo Sanes M.D.   On: 07/01/2021 18:01    Scheduled Meds:  [START ON 07/03/2021] levothyroxine  50 mcg Oral Q0600   Continuous Infusions:  cefTRIAXone (ROCEPHIN)  IV 2 g (07/02/21 1256)     LOS: 2 days    Time spent: 35 mins    Jamicheal Heard, MD [facility]   If 7PM-7AM, please contact night-coverage

## 2021-07-02 NOTE — Consult Note (Addendum)
[facility]  INPATIENT CONSULTATION  Reason for Consultation: hyponatremia Requesting Provider: Dr. Dwyane Dee  HPI: Arthur Mcmahon is an 55 y.o. male with with h/o HTN and EtOH abuse and recent RLE ischemia requiring intervention and bladder mass who presented to the ED 10/27 for weakness and hematuria.  He was found to have hyponatremia and nephrology is consulted for evaluation and management.   Admitted earlier this month with critical RLE ischemia and required several operative interventions.  He was discharged on eliquis.  During that admission CT concerning for primary bladder malignancy with nodal metastatic disease and bulky retroperitoneal lymph nodes.  He was referred to urology outpt.   He went to urology 10/27 where hematuria and voiding difficulties were noted.  A foley was placed and he was sent to  the ED due to dyspnea, weakness.  ED labs showing Na 115, K 5.1, Bicarb 21, BUN 18, Cr 1.6, WBC 26, Hb 9 which trended down further to 7. He's been transfused.  CT A/P - large R bladder mass with hydro, extensive nodes. CT chest w contrast - chest nodes +.  Eliquis on hold, serous leak from R groin being followed by vascular. A wound vac has been placed to manage this.   He rec'd 1L NS in the ED and next serum sodium 120, Cr 1.4. Since then trend has been 120 > 117 > 121 > 115 > 120.  Per I/Os he's net neg 3.5L since presenting.  UA on admission was turbid.  Urine sodium 18. BPs in the 90-100s, HR normal. TSH 13.5, cortisol 23.  He currently is feeling ok.  Wasn't eating great prior to admission but was drinking.  LE edema is new since recently hospital d/c; it's been R>L.  He doesn't feel particularly thirsty or dry.  He is eating better today.  He's been placed on 1034mL fluid restriction.   For reference 06/19/21 labs Na 139, K 4, Bicarb 22, BUN 15, Cr 1, Hb 14  PMH: Past Medical History:  Diagnosis Date   Anemia    ETOH abuse    Hypertension    Peripheral vascular  disease (HCC)    Pneumonia    PSH: Past Surgical History:  Procedure Laterality Date   ABDOMINAL AORTOGRAM W/LOWER EXTREMITY Bilateral 03/31/2021   Procedure: ABDOMINAL AORTOGRAM W/LOWER EXTREMITY;  Surgeon: [name], MD;  Location: [facility];  Service: Cardiovascular;  Laterality: Bilateral;   AMPUTATION Left 04/29/2021   Procedure: Left second Partial TOE AMPUTATION;  Surgeon: [name], MD;  Location: [facility];  Service: Vascular;  Laterality: Left;   ENDARTERECTOMY FEMORAL Left 04/11/2021   Procedure: LEFT FEMORAL ENDARTERECTOMY;  Surgeon: [name], MD;  Location: [facility];  Service: Vascular;  Laterality: Left;   ENDARTERECTOMY FEMORAL Right 06/18/2021   Procedure: ENDARTERECTOMY RIGHT FEMORAL ARTERY;  Surgeon: [name], MD;  Location: [facility];  Service: Vascular;  Laterality: Right;   FASCIOTOMY Right 06/18/2021   Procedure: RIGHT LOWER EXTREMITY FASCIOTOMY;  Surgeon: [name], MD;  Location: [facility];  Service: Vascular;  Laterality: Right;   FRACTURE SURGERY Right    femur   INSERTION OF ILIAC STENT Left 04/11/2021   Procedure: INSERTION OF LEFT ABOVE KNEE POPLITEAL STENT;  Surgeon: [name], MD;  Location: [facility];  Service: Vascular;  Laterality: Left;   INTRAOPERATIVE ARTERIOGRAM Left 04/11/2021   Procedure: INTRA OPERATIVE ARTERIOGRAM;  Surgeon: [name], MD;  Location: [facility];  Service: Vascular;  Laterality:  Left;   PATCH ANGIOPLASTY Left 04/11/2021   Procedure: PATCH ANGIOPLASTY LEFT COMMON FEMORAL ARTERY, PROFUNDOPLASTY;  Surgeon: [name], MD;  Location: [facility];  Service: Vascular;  Laterality: Left;   PATCH ANGIOPLASTY Right 06/18/2021   Procedure: PATCH ANGIOPLASTY USING Rueben Bash BIOLOGIC PATCH;  Surgeon: [name], MD;  Location: [facility];  Service: Vascular;  Laterality: Right;   THROMBECTOMY FEMORAL ARTERY Right 06/18/2021   Procedure: THROMBECTOMY RIGHT LOWER EXTREMITY;  Surgeon:  [name], MD;  Location: [facility];  Service: Vascular;  Laterality: Right;   THROMBECTOMY FEMORAL ARTERY Right 06/19/2021   Procedure: THROMBECTOMY RIGHT LOWER EXTREMITY VIA POPITEAL APPROACH;  Surgeon: [name], MD;  [facility];  Service: Vascular;  Laterality: Right;   TONSILLECTOMY      Past Medical History:  Diagnosis Date   Anemia    ETOH abuse    Hypertension    Peripheral vascular disease ([place])    Pneumonia     Medications:  I have reviewed the patient's current medications. Ceftriaxone PRN APAP and morphine, oxycodone  (Not in a hospital admission)   ALLERGIES:  No Known Allergies  FAM HX: Family History  Problem Relation Age of Onset   Cancer Mother    Heart failure Mother    Cancer Father     Social History:   reports that he has been smoking cigarettes. He has a 12.50 pack-year smoking history. He has never used smokeless tobacco. He reports that he does not currently use alcohol. He reports current drug use. Drug: Marijuana.  ROS: 12 system ROS neg except per HPI  Blood pressure 111/74, pulse 79, temperature 98 F (36.7 C), temperature source Oral, resp. rate 15, height 6\' 2"  (1.88 m), weight 84.8 kg, SpO2 100 %. PHYSICAL EXAM: Gen: chronically ill frail appearing man eating normal lunch  Eyes: anicteric ENT: poor dentition, MMM Neck: no JVF CV: RRR, no rub Abd: soft, nontender Lungs: scattered wheezes, on RA with normal WOB GU: foley draining bloody urine with some frank debris and clots Extr: R leg with 2-3+ pitting edema to hip, L leg with 2+ - less than R Neuro: grossly nonfocal, conversant   Results for orders placed or performed during the hospital encounter of 06/30/21 (from the past 48 hour(s))  CBC with Differential     Status: Abnormal   Collection Time: 06/30/21  5:58 PM  Result Value Ref Range   WBC 26.1 (H) 4.0 - 10.5 K/uL   RBC 2.78 (L) 4.22 - 5.81 MIL/uL   Hemoglobin 9.0 (L) 13.0 - 17.0 g/dL   HCT 26.2  (L) 39.0 - 52.0 %   MCV 94.2 80.0 - 100.0 fL   MCH 32.4 26.0 - 34.0 pg   MCHC 34.4 30.0 - 36.0 g/dL   RDW 13.0 11.5 - 15.5 %   Platelets 498 (H) 150 - 400 K/uL   nRBC 0.0 0.0 - 0.2 %   Neutrophils Relative % 92 %   Neutro Abs 24.0 (H) 1.7 - 7.7 K/uL   Lymphocytes Relative 4 %   Lymphs Abs 1.0 0.7 - 4.0 K/uL   Monocytes Relative 4 %   Monocytes Absolute 1.0 0.1 - 1.0 K/uL   Eosinophils Relative 0 %   Eosinophils Absolute 0.0 0.0 - 0.5 K/uL   Basophils Relative 0 %   Basophils Absolute 0.0 0.0 - 0.1 K/uL   nRBC 0 0 /100 WBC   Abs Immature Granulocytes 0.00 0.00 - 0.07 K/uL   Polychromasia PRESENT  Comment: Performed at [facility], [place] [address], [place], [place] [address]  Comprehensive metabolic panel     Status: Abnormal   Collection Time: 06/30/21  5:58 PM  Result Value Ref Range   Sodium 115 (LL) 135 - 145 mmol/L    Comment: CRITICAL RESULT CALLED TO, READ BACK BY AND VERIFIED WITH:  K. Rocky Link, RN, 1926, 06/30/21, E. ADEDOKUN.    Potassium 5.1 3.5 - 5.1 mmol/L   Chloride 81 (L) 98 - 111 mmol/L   CO2 21 (L) 22 - 32 mmol/L   Glucose, Bld 91 70 - 99 mg/dL    Comment: Glucose reference range applies only to samples taken after fasting for at least 8 hours.   BUN 18 6 - 20 mg/dL   Creatinine, Ser 1.56 (H) 0.61 - 1.24 mg/dL   Calcium 8.5 (L) 8.9 - 10.3 mg/dL   Total Protein 7.2 6.5 - 8.1 g/dL   Albumin 2.4 (L) 3.5 - 5.0 g/dL   AST 21 15 - 41 U/L   ALT 13 0 - 44 U/L   Alkaline Phosphatase 85 38 - 126 U/L   Total Bilirubin 1.3 (H) 0.3 - 1.2 mg/dL   GFR, Estimated 52 (L) >60 mL/min    Comment: (NOTE) Calculated using the CKD-EPI Creatinine Equation (2021)    Anion gap 13 5 - 15    Comment: Performed at [facility], [place] [address] [place], [place] [address]  Lactic acid, plasma     Status: Abnormal   Collection Time: 06/30/21  5:58 PM  Result Value Ref Range   Lactic Acid, Venous 2.9 (HH) 0.5 - 1.9 mmol/L    Comment: CRITICAL RESULT CALLED TO, READ  BACK BY AND VERIFIED WITH:  Glenda Chroman, RN, 1909, 06/30/21, E. ADEDOKUN. Performed at [facility], [place] [address] [place], [place] [address]   Blood culture (routine x 2)     Status: Abnormal (Preliminary result)   Collection Time: 06/30/21  5:58 PM   Specimen: Site Not Specified; Blood  Result Value Ref Range   Specimen Description SITE NOT SPECIFIED    Special Requests      BOTTLES DRAWN AEROBIC AND ANAEROBIC Blood Culture results may not be optimal due to an excessive volume of blood received in culture bottles   Culture  Setup Time      GRAM POSITIVE COCCI IN CHAINS AEROBIC BOTTLE ONLY CRITICAL RESULT CALLED TO, READ BACK BY AND VERIFIED WITH: PHARMD E BREWING [id number] AT 33 AM BY CM    Culture (A)     STREPTOCOCCUS GROUP G SUSCEPTIBILITIES TO FOLLOW Performed at [facility], [place] [address], [place], [place] [address]    Report Status PENDING   Blood Culture ID Panel (Reflexed)     Status: Abnormal   Collection Time: 06/30/21  5:58 PM  Result Value Ref Range   Enterococcus faecalis NOT DETECTED NOT DETECTED   Enterococcus Faecium NOT DETECTED NOT DETECTED   Listeria monocytogenes NOT DETECTED NOT DETECTED   Staphylococcus species NOT DETECTED NOT DETECTED   Staphylococcus aureus (BCID) NOT DETECTED NOT DETECTED   Staphylococcus epidermidis NOT DETECTED NOT DETECTED   Staphylococcus lugdunensis NOT DETECTED NOT DETECTED   Streptococcus species DETECTED (A) NOT DETECTED    Comment: Not Enterococcus species, Streptococcus agalactiae, Streptococcus pyogenes, or Streptococcus pneumoniae. CRITICAL RESULT CALLED TO, READ BACK BY AND VERIFIED WITH: PHARMD E BREWING [id number] AT 837 AM BY CM    Streptococcus agalactiae NOT DETECTED NOT DETECTED  Streptococcus pneumoniae NOT DETECTED NOT DETECTED   Streptococcus pyogenes NOT DETECTED NOT DETECTED   A.calcoaceticus-baumannii NOT DETECTED NOT DETECTED   Bacteroides fragilis NOT DETECTED NOT DETECTED   Enterobacterales  NOT DETECTED NOT DETECTED   Enterobacter cloacae complex NOT DETECTED NOT DETECTED   Escherichia coli NOT DETECTED NOT DETECTED   Klebsiella aerogenes NOT DETECTED NOT DETECTED   Klebsiella oxytoca NOT DETECTED NOT DETECTED   Klebsiella pneumoniae NOT DETECTED NOT DETECTED   Proteus species NOT DETECTED NOT DETECTED   Salmonella species NOT DETECTED NOT DETECTED   Serratia marcescens NOT DETECTED NOT DETECTED   Haemophilus influenzae NOT DETECTED NOT DETECTED   Neisseria meningitidis NOT DETECTED NOT DETECTED   Pseudomonas aeruginosa NOT DETECTED NOT DETECTED   Stenotrophomonas maltophilia NOT DETECTED NOT DETECTED   Candida albicans NOT DETECTED NOT DETECTED   Candida auris NOT DETECTED NOT DETECTED   Candida glabrata NOT DETECTED NOT DETECTED   Candida krusei NOT DETECTED NOT DETECTED   Candida parapsilosis NOT DETECTED NOT DETECTED   Candida tropicalis NOT DETECTED NOT DETECTED   Cryptococcus neoformans/gattii NOT DETECTED NOT DETECTED    Comment: Performed at [facility], [place] [address], [place], [place] [address]  Osmolality     Status: Abnormal   Collection Time: 06/30/21 11:51 PM  Result Value Ref Range   Osmolality 243 (LL) 275 - 295 mOsm/kg    Comment: REPEATED TO VERIFY CRITICAL RESULT CALLED TO, READ BACK BY AND VERIFIED WITH: HUNTER,S RN 07/01/2021 AT 0116 SKEEN,P Performed at [facility], [place] [address], [place], [place] [address]   Blood culture (routine x 2)     Status: None (Preliminary result)   Collection Time: 07/01/21 12:16 AM   Specimen: BLOOD RIGHT FOREARM  Result Value Ref Range   Specimen Description BLOOD RIGHT FOREARM    Special Requests      BOTTLES DRAWN AEROBIC AND ANAEROBIC Blood Culture adequate volume   Culture      NO GROWTH 1 DAY Performed at [facility], [place] [address], [place], [place] [address]    Report Status PENDING   Urinalysis, Routine w reflex microscopic     Status: Abnormal   Collection Time: 07/01/21 12:20  AM  Result Value Ref Range   Color, Urine RED (A) YELLOW   APPearance TURBID (A) CLEAR   Specific Gravity, Urine  1.005 - 1.030    TEST NOT REPORTED DUE TO COLOR INTERFERENCE OF URINE PIGMENT   pH  5.0 - 8.0    TEST NOT REPORTED DUE TO COLOR INTERFERENCE OF URINE PIGMENT   Glucose, UA (A) NEGATIVE mg/dL    TEST NOT REPORTED DUE TO COLOR INTERFERENCE OF URINE PIGMENT   Hgb urine dipstick (A) NEGATIVE    TEST NOT REPORTED DUE TO COLOR INTERFERENCE OF URINE PIGMENT   Bilirubin Urine (A) NEGATIVE    TEST NOT REPORTED DUE TO COLOR INTERFERENCE OF URINE PIGMENT   Ketones, ur (A) NEGATIVE mg/dL    TEST NOT REPORTED DUE TO COLOR INTERFERENCE OF URINE PIGMENT   Protein, ur (A) NEGATIVE mg/dL    TEST NOT REPORTED DUE TO COLOR INTERFERENCE OF URINE PIGMENT   Nitrite (A) NEGATIVE    TEST NOT REPORTED DUE TO COLOR INTERFERENCE OF URINE PIGMENT   Leukocytes,Ua (A) NEGATIVE    TEST NOT REPORTED DUE TO COLOR INTERFERENCE OF URINE PIGMENT   RBC / HPF >50 (H) 0 - 5 RBC/hpf   WBC, UA >50 (H) 0 - 5 WBC/hpf  Bacteria, UA MANY (A) NONE SEEN   WBC Clumps PRESENT    Non Squamous Epithelial 11-20 (A) NONE SEEN    Comment: Performed at [facility], [place] [address], [place], [place] [address]  Sodium, urine, random     Status: None   Collection Time: 07/01/21 12:20 AM  Result Value Ref Range   Sodium, Ur 18 mmol/L    Comment: Performed at [facility] [address] [place],  [address]  Resp Panel by RT-PCR (Flu A&B, Covid) Nasopharyngeal Swab     Status: None   Collection Time: 07/01/21 12:21 AM   Specimen: Nasopharyngeal Swab; Nasopharyngeal(NP) swabs in vial transport medium  Result Value Ref Range   SARS Coronavirus 2 by RT PCR NEGATIVE NEGATIVE    Comment: (NOTE) SARS-CoV-2 target nucleic acids are NOT DETECTED.  The SARS-CoV-2 RNA is generally detectable in upper respiratory specimens during the acute phase of infection. The lowest concentration of SARS-CoV-2 viral copies  this assay can detect is 138 copies/mL. A negative result does not preclude SARS-Cov-2 infection and should not be used as the sole basis for treatment or other patient management decisions. A negative result may occur with  improper specimen collection/handling, submission of specimen other than nasopharyngeal swab, presence of viral mutation(s) within the areas targeted by this assay, and inadequate number of viral copies(<138 copies/mL). A negative result must be combined with clinical observations, patient history, and epidemiological information. The expected result is Negative.  Fact Sheet for Patients:  EntrepreneurPulse.com.au  Fact Sheet for Healthcare Providers:  IncredibleEmployment.be  This test is no t yet approved or cleared by the [place] FDA and  has been authorized for detection and/or diagnosis of SARS-CoV-2 by FDA under an Emergency Use Authorization (EUA). This EUA will remain  in effect (meaning this test can be used) for the duration of the COVID-19 declaration under Section 564(b)(1) of the Act, 21 U.S.C.section 360bbb-3(b)(1), unless the authorization is terminated  or revoked sooner.       Influenza A by PCR NEGATIVE NEGATIVE   Influenza B by PCR NEGATIVE NEGATIVE    Comment: (NOTE) The Xpert Xpress SARS-CoV-2/FLU/RSV plus assay is intended as an aid in the diagnosis of influenza from Nasopharyngeal swab specimens and should not be used as a sole basis for treatment. Nasal washings and aspirates are unacceptable for Xpert Xpress SARS-CoV-2/FLU/RSV testing.  Fact Sheet for Patients: EntrepreneurPulse.com.au  Fact Sheet for Healthcare Providers: IncredibleEmployment.be  This test is not yet approved or cleared by the [place] FDA and has been authorized for detection and/or diagnosis of SARS-CoV-2 by FDA under an Emergency Use Authorization (EUA). This EUA will  remain in effect (meaning this test can be used) for the duration of the COVID-19 declaration under Section 564(b)(1) of the Act, 21 U.S.C. section 360bbb-3(b)(1), unless the authorization is terminated or revoked.  Performed at [facility], [place] [address] [place], [place] [address]   HIV Antibody (routine testing w rflx)     Status: None   Collection Time: 07/01/21  4:12 AM  Result Value Ref Range   HIV Screen 4th Generation wRfx Non Reactive Non Reactive    Comment: Performed at [facility], [place] [address] [place] [address], [place] [address]  CBC     Status: Abnormal   Collection Time: 07/01/21  4:12 AM  Result Value Ref Range   WBC 16.3 (H) 4.0 - 10.5 K/uL   RBC 2.27 (L) 4.22 - 5.81 MIL/uL  Hemoglobin 7.6 (L) 13.0 - 17.0 g/dL   HCT 21.3 (L) 39.0 - 52.0 %   MCV 93.8 80.0 - 100.0 fL   MCH 33.5 26.0 - 34.0 pg   MCHC 35.7 30.0 - 36.0 g/dL   RDW 13.2 11.5 - 15.5 %   Platelets 389 150 - 400 K/uL   nRBC 0.0 0.0 - 0.2 %    Comment: Performed at [facility] [address], [place], [place] [address]  Cortisol     Status: None   Collection Time: 07/01/21  4:12 AM  Result Value Ref Range   Cortisol, Plasma 23.1 ug/dL    Comment: (NOTE) AM    6.7 - 22.6 ug/dL PM   <10.0       ug/dL Performed at [facility] [address], [place], [place] [address]   TSH     Status: Abnormal   Collection Time: 07/01/21  4:12 AM  Result Value Ref Range   TSH 13.445 (H) 0.350 - 4.500 uIU/mL    Comment: Performed by a 3rd Generation assay with a functional sensitivity of <=0.01 uIU/mL. Performed at [facility], [place] [address], [place], [place] [address]   Basic metabolic panel     Status: Abnormal   Collection Time: 07/01/21  4:12 AM  Result Value Ref Range   Sodium 120 (L) 135 - 145 mmol/L   Potassium 4.1 3.5 - 5.1 mmol/L   Chloride 87 (L) 98 - 111 mmol/L   CO2 17 (L) 22 - 32 mmol/L   Glucose, Bld 95 70 - 99 mg/dL    Comment: Glucose reference range applies  only to samples taken after fasting for at least 8 hours.   BUN 17 6 - 20 mg/dL   Creatinine, Ser 1.37 (H) 0.61 - 1.24 mg/dL   Calcium 7.8 (L) 8.9 - 10.3 mg/dL   GFR, Estimated >60 >60 mL/min    Comment: (NOTE) Calculated using the CKD-EPI Creatinine Equation (2021)    Anion gap 16 (H) 5 - 15    Comment: Performed at [facility] [address], [place], [place] [address]  Type and screen [place]     Status: None (Preliminary result)   Collection Time: 07/01/21  6:00 AM  Result Value Ref Range   ABO/RH(D) O NEG    Antibody Screen NEG    Sample Expiration 07/04/2021,2359    Unit Number [id number]    Blood Component Type RBC LR PHER2    Unit division 00    Status of Unit ISSUED    Transfusion Status OK TO TRANSFUSE    Crossmatch Result      Compatible Performed at [facility], [place] [address], [place],  [address]   Basic metabolic panel     Status: Abnormal   Collection Time: 07/01/21  6:55 AM  Result Value Ref Range   Sodium 117 (LL) 135 - 145 mmol/L    Comment: CRITICAL RESULT CALLED TO, READ BACK BY AND VERIFIED WITH: Eulas Post, RN [phone] 07/01/21 L. KLAR    Potassium 3.9 3.5 - 5.1 mmol/L   Chloride 86 (L) 98 - 111 mmol/L   CO2 22 22 - 32 mmol/L   Glucose, Bld 90 70 - 99 mg/dL    Comment: Glucose reference range applies only to samples taken after fasting for at least 8 hours.   BUN 16 6 - 20 mg/dL   Creatinine, Ser 1.23 0.61 - 1.24 mg/dL  Calcium 7.7 (L) 8.9 - 10.3 mg/dL   GFR, Estimated >60 >60 mL/min    Comment: (NOTE) Calculated using the CKD-EPI Creatinine Equation (2021)    Anion gap 9 5 - 15    Comment: Performed at [facility], [place] [address], [place], [place] [address]  CBC     Status: Abnormal   Collection Time: 07/01/21  6:55 AM  Result Value Ref Range   WBC 15.6 (H) 4.0 - 10.5 K/uL   RBC 2.28 (L) 4.22 - 5.81 MIL/uL   Hemoglobin 7.3 (L) 13.0 - 17.0 g/dL   HCT 21.7 (L) 39.0 - 52.0 %   MCV 95.2 80.0 -  100.0 fL   MCH 32.0 26.0 - 34.0 pg   MCHC 33.6 30.0 - 36.0 g/dL   RDW 13.1 11.5 - 15.5 %   Platelets 382 150 - 400 K/uL   nRBC 0.0 0.0 - 0.2 %    Comment: Performed at [facility], [place] [address], [place], [place] [address]  Lactic acid, plasma     Status: None   Collection Time: 07/01/21 11:49 AM  Result Value Ref Range   Lactic Acid, Venous 1.5 0.5 - 1.9 mmol/L    Comment: Performed at [facility] [address] [place], [place] [address]  Basic metabolic panel     Status: Abnormal   Collection Time: 07/01/21 11:49 AM  Result Value Ref Range   Sodium 121 (L) 135 - 145 mmol/L   Potassium 4.1 3.5 - 5.1 mmol/L   Chloride 89 (L) 98 - 111 mmol/L   CO2 22 22 - 32 mmol/L   Glucose, Bld 90 70 - 99 mg/dL    Comment: Glucose reference range applies only to samples taken after fasting for at least 8 hours.   BUN 15 6 - 20 mg/dL   Creatinine, Ser 1.14 0.61 - 1.24 mg/dL   Calcium 7.7 (L) 8.9 - 10.3 mg/dL   GFR, Estimated >60 >60 mL/min    Comment: (NOTE) Calculated using the CKD-EPI Creatinine Equation (2021)    Anion gap 10 5 - 15    Comment: Performed at [facility] [address] [place], [place] [address]  CBC     Status: Abnormal   Collection Time: 07/01/21 11:49 AM  Result Value Ref Range   WBC 15.2 (H) 4.0 - 10.5 K/uL   RBC 2.26 (L) 4.22 - 5.81 MIL/uL   Hemoglobin 7.4 (L) 13.0 - 17.0 g/dL   HCT 21.1 (L) 39.0 - 52.0 %   MCV 93.4 80.0 - 100.0 fL   MCH 32.7 26.0 - 34.0 pg   MCHC 35.1 30.0 - 36.0 g/dL   RDW 13.0 11.5 - 15.5 %   Platelets 337 150 - 400 K/uL   nRBC 0.0 0.0 - 0.2 %    Comment: Performed at [facility], [place] [address] [place], [place] [address]  Basic metabolic panel     Status: Abnormal   Collection Time: 07/01/21  3:24 PM  Result Value Ref Range   Sodium 115 (LL) 135 - 145 mmol/L    Comment: CRITICAL RESULT CALLED TO, READ BACK BY AND VERIFIED WITH: R.HARDY,RN @1730  07/01/2021 VANG.J    Potassium 3.9 3.5 - 5.1 mmol/L    Chloride 85 (L) 98 - 111 mmol/L   CO2 23 22 - 32 mmol/L   Glucose, Bld 85 70 - 99 mg/dL    Comment: Glucose reference range applies only to samples taken after fasting  for at least 8 hours.   BUN 15 6 - 20 mg/dL   Creatinine, Ser 1.27 (H) 0.61 - 1.24 mg/dL   Calcium 7.4 (L) 8.9 - 10.3 mg/dL   GFR, Estimated >60 >60 mL/min    Comment: (NOTE) Calculated using the CKD-EPI Creatinine Equation (2021)    Anion gap 7 5 - 15    Comment: Performed at [facility] [address] [place], [place] [address]  Basic metabolic panel     Status: Abnormal   Collection Time: 07/01/21  7:24 PM  Result Value Ref Range   Sodium 117 (LL) 135 - 145 mmol/L    Comment: CRITICAL RESULT CALLED TO, READ BACK BY AND VERIFIED WITH: B.BENTON,RN @2021  07/01/2021 VANG.J    Potassium 4.0 3.5 - 5.1 mmol/L   Chloride 87 (L) 98 - 111 mmol/L   CO2 18 (L) 22 - 32 mmol/L   Glucose, Bld 75 70 - 99 mg/dL    Comment: Glucose reference range applies only to samples taken after fasting for at least 8 hours.   BUN 14 6 - 20 mg/dL   Creatinine, Ser 1.20 0.61 - 1.24 mg/dL   Calcium 7.5 (L) 8.9 - 10.3 mg/dL   GFR, Estimated >60 >60 mL/min    Comment: (NOTE) Calculated using the CKD-EPI Creatinine Equation (2021)    Anion gap 12 5 - 15    Comment: Performed at [facility] [address] [place], [place] [address]  CBC     Status: Abnormal   Collection Time: 07/02/21  2:46 AM  Result Value Ref Range   WBC 11.3 (H) 4.0 - 10.5 K/uL   RBC 2.12 (L) 4.22 - 5.81 MIL/uL   Hemoglobin 7.0 (L) 13.0 - 17.0 g/dL   HCT 19.8 (L) 39.0 - 52.0 %   MCV 93.4 80.0 - 100.0 fL   MCH 33.0 26.0 - 34.0 pg   MCHC 35.4 30.0 - 36.0 g/dL   RDW 13.0 11.5 - 15.5 %   Platelets 338 150 - 400 K/uL   nRBC 0.0 0.0 - 0.2 %    Comment: Performed at [facility], [place] [address], [place], [place] [address]  Magnesium     Status: None   Collection Time: 07/02/21  2:46 AM  Result Value Ref Range   Magnesium 2.0 1.7 - 2.4 mg/dL     Comment: Performed at [facility] [address] [place], [place] [address]  Phosphorus     Status: Abnormal   Collection Time: 07/02/21  2:46 AM  Result Value Ref Range   Phosphorus 2.2 (L) 2.5 - 4.6 mg/dL    Comment: Performed at [facility] [address], [place], [place] [address]  Basic metabolic panel     Status: Abnormal   Collection Time: 07/02/21  6:09 AM  Result Value Ref Range   Sodium 120 (L) 135 - 145 mmol/L   Potassium 3.8 3.5 - 5.1 mmol/L   Chloride 89 (L) 98 - 111 mmol/L   CO2 23 22 - 32 mmol/L   Glucose, Bld 99 70 - 99 mg/dL    Comment: Glucose reference range applies only to samples taken after fasting for at least 8 hours.   BUN 11 6 - 20 mg/dL   Creatinine, Ser 1.09 0.61 - 1.24 mg/dL   Calcium 7.7 (L) 8.9 - 10.3 mg/dL   GFR, Estimated >60 >60 mL/min    Comment: (NOTE) Calculated using the CKD-EPI Creatinine Equation (2021)  Anion gap 8 5 - 15    Comment: Performed at [facility] [address], [place], [place] [address]  Prepare RBC (crossmatch)     Status: None   Collection Time: 07/02/21  6:11 AM  Result Value Ref Range   Order Confirmation      ORDER PROCESSED BY BLOOD BANK Performed at [facility], [address] [place], [place] [address]     DG Chest 2 View  Result Date: 06/30/2021 CLINICAL DATA:  Possible sepsis EXAM: CHEST - 2 VIEW COMPARISON:  03/22/2021 FINDINGS: Heart and mediastinal contours are within normal limits. No focal opacities or effusions. No acute bony abnormality. IMPRESSION: No active cardiopulmonary disease. Electronically Signed   By: Rolm Baptise M.D.   On: 06/30/2021 18:58   CT CHEST W CONTRAST  Result Date: 07/01/2021 CLINICAL DATA:  Gross hematuria, shortness of breath, urologic cancer, staging EXAM: CT CHEST WITH CONTRAST TECHNIQUE: Multidetector CT imaging of the chest was performed during intravenous contrast administration. CONTRAST:  125mL OMNIPAQUE IOHEXOL 350 MG/ML SOLN COMPARISON:   06/18/2021 FINDINGS: Cardiovascular: The heart and great vessels are unremarkable without pericardial effusion. Extensive atherosclerosis of the coronary vasculature. No evidence of thoracic aortic aneurysm or dissection. While not optimized for opacification of the pulmonary vasculature, there is sufficient contrast enhancement to exclude pulmonary emboli. No filling defects. Mediastinum/Nodes: Extensive pathologic adenopathy is seen within the left supraclavicular region. Largest lymph node mass on image 11/3 measures 1.7 x 3.5 cm. Additionally, there are numerous pathologic lymph nodes within the left axilla, largest measuring up to 1.5 cm in short axis reference image 23/3. Pathologic adenopathy is seen within the left para-aortic region, largest lymph node measuring 1.3 cm in short axis reference image 135/3. No pathologic adenopathy within the mediastinum, hila, or right axilla. The thyroid, trachea, and esophagus are unremarkable. Lungs/Pleura: No acute airspace disease, effusion, or pneumothorax. Mild right lower lobe bronchial wall thickening and mucous plugging. Remaining central airways are patent. No pulmonary nodules or masses. Upper Abdomen: Enlarged right retrocrural lymph node measures 1.1 cm in short axis, reference image 154/3. Subcentimeter retrocaval lymph nodes are also noted. Findings are consistent with metastatic disease, please refer to CT abdomen report. Musculoskeletal: There are no acute or destructive bony lesions. Reconstructed images demonstrate no additional findings. IMPRESSION: 1. Pathologic adenopathy within the left supraclavicular region, left axilla, and left para-aortic region, consistent with metastatic disease given patient's known history of urologic malignancy. 2. Borderline enlarged lymph nodes within the retrocrural region and retroperitoneum, consistent with metastatic disease. Please refer to CT abdomen report for full description. 3. Minimal right lower lobe bronchial  wall thickening and mucous plugging, which may reflect reactive airway disease. No acute airspace disease. 4.  Aortic Atherosclerosis (ICD10-I70.0). Electronically Signed   By: Randa Ngo M.D.   On: 07/01/2021 17:39   VAS [place] ABI WITH/WO TBI  Result Date: 06/30/2021  LOWER EXTREMITY DOPPLER STUDY Patient Name:  Arthur Mcmahon  Date of Exam:   06/30/2021 Medical Rec #: [id number]       Accession #:    [id number] Date of Birth: 11/28/1965       Patient Gender: M Patient Age:   40 years Exam Location:  [facility] Procedure:      VAS [place] ABI WITH/WO TBI Referring Phys: Aldona Bar RHYNE --------------------------------------------------------------------------------  Indications: Claudication, rest pain, and peripheral artery disease. High Risk Factors: Hypertension, current smoker.  Vascular Interventions: 06/19/2021 Thrombectomy of right popliteal artery  06/18/2021 Right femoral endarterectomy , thrombectomy                         and fasciotomy                         04/11/2021 Left femoral endarterectomy. Performing Technologist: Delorise Shiner RVT  Examination Guidelines: A complete evaluation includes at minimum, Doppler waveform signals and systolic blood pressure reading at the level of bilateral brachial, anterior tibial, and posterior tibial arteries, when vessel segments are accessible. Bilateral testing is considered an integral part of a complete examination. Photoelectric Plethysmograph (PPG) waveforms and toe systolic pressure readings are included as required and additional duplex testing as needed. Limited examinations for reoccurring indications may be performed as noted.  ABI Findings: +---------+------------------+-----+----------+--------+ Right    Rt Pressure (mmHg)IndexWaveform  Comment  +---------+------------------+-----+----------+--------+ Brachial 141                                        +---------+------------------+-----+----------+--------+ PTA      100               0.71 monophasic         +---------+------------------+-----+----------+--------+ PERO                            triphasic          +---------+------------------+-----+----------+--------+ DP       0                 0.00 absent             +---------+------------------+-----+----------+--------+ Great Toe0                 0.00 Absent             +---------+------------------+-----+----------+--------+ +---------+------------------+-----+---------+-------+ Left     Lt Pressure (mmHg)IndexWaveform Comment +---------+------------------+-----+---------+-------+ Brachial 141                                     +---------+------------------+-----+---------+-------+ PTA      202               1.43 triphasic        +---------+------------------+-----+---------+-------+ DP       177               1.26 biphasic         +---------+------------------+-----+---------+-------+ Great Toe0                 0.00                  +---------+------------------+-----+---------+-------+ +-------+-----------+-----------+------------+------------+ ABI/TBIToday's ABIToday's TBIPrevious ABIPrevious TBI +-------+-----------+-----------+------------+------------+ Right  0.71       0          0.62        0.43         +-------+-----------+-----------+------------+------------+ Left   1.43       0          1.32        0.46         +-------+-----------+-----------+------------+------------+ Bilateral ABIs appear essentially unchanged compared to prior study on 05/31/2021.  Summary: Right: Resting right ankle-brachial index indicates moderate right lower extremity arterial disease. The right toe-brachial  index is abnormal. Left: Resting left ankle-brachial index indicates noncompressible left lower extremity arteries. The left toe-brachial index is abnormal.  *See table(s) above for measurements  and observations.  Electronically signed by Deitra Mayo MD on 06/30/2021 at 3:28:33 PM.    Final    CT HEMATURIA WORKUP  Result Date: 07/01/2021 CLINICAL DATA:  Gross hematuria. EXAM: CT ABDOMEN AND PELVIS WITHOUT AND WITH CONTRAST TECHNIQUE: Multidetector CT imaging of the abdomen and pelvis was performed following the standard protocol before and following the bolus administration of intravenous contrast. CONTRAST:  1101mL OMNIPAQUE IOHEXOL 350 MG/ML SOLN COMPARISON:  None. FINDINGS: Lower chest: The lung bases are clear of acute process. No pleural effusion or pulmonary lesions. The heart is normal in size. No pericardial effusion. The distal esophagus and aorta are unremarkable. Hepatobiliary: No hepatic lesions to suggest metastatic disease. No intrahepatic biliary dilatation. The gallbladder is unremarkable. No common bile duct dilatation. Pancreas: No mass, inflammation or ductal dilatation. Spleen: Normal size.  No focal lesions. Adrenals/Urinary Tract: The adrenal glands are unremarkable. Left renal artery calcifications but no definite renal calculi. There is a simple left renal cyst noted. The right kidney demonstrates marked hydronephrosis and there is also marked right hydroureter down into the pelvis. High attenuation material noted in distal third of the right ureter which could be blood or tumor. No right renal lesions are identified. Decreased perfusion because of the obstruction. There is a large irregular right-sided bladder mass likely obstructing the right ureter. The left ureter is patent. Associated extensive pelvic lymphadenopathy the. Stomach/Bowel: The stomach, duodenum, small bowel and colon are grossly normal. Vascular/Lymphatic: Age advanced vascular disease but no aneurysm or dissection. Extensive retroperitoneal and pelvic adenopathy as demonstrated on the recent study from 06/18/2021. Largest nodal mass is in the right external iliac region and measures 4.9 cm. The right  pelvic sidewall adenopathy measures 2.5 cm. Partially necrotic retroperitoneal adenopathy surrounding the aorta. The largest measures 18 mm on image 39/8. Reproductive: The prostate gland and seminal vesicles are unremarkable. Other: There is diffuse lower pelvic body wall edema asymmetric on the right side and also bobbing the upper thigh. This is most likely due to compression of the right femoral vein due to the bulk E right femoral adenopathy measuring a maximum of 3 cm on image 80/8 Musculoskeletal: No findings suspicious for osseous metastatic disease. IMPRESSION: 1. Large (7 cm) irregular right-sided bladder mass likely obstructing the right ureter with marked right-sided hydroureteronephrosis. High attenuation material in the distal right ureter could be blood or tumor. 2. Extensive retroperitoneal and pelvic adenopathy. 3. No findings suspicious for metastatic disease involving the lung bases, liver or bony structures. 4. Age advanced vascular disease. 5. Compression of the right common femoral vein by adenopathy causing right lower extremity swelling/edema. Electronically Signed   By: Marijo Sanes M.D.   On: 07/01/2021 18:01   VAS [place] LOWER EXTREMITY ARTERIAL DUPLEX  Result Date: 06/30/2021 LOWER EXTREMITY ARTERIAL DUPLEX STUDY Patient Name:  Arthur Mcmahon  Date of Exam:   06/30/2021 Medical Rec #: [id number]       Accession #:    [id number] Date of Birth: 1966/06/03       Patient Gender: M Patient Age:   40 years Exam Location:  [facility] Procedure:      VAS [place] LOWER EXTREMITY ARTERIAL DUPLEX Referring Phys: Aldona Bar RHYNE --------------------------------------------------------------------------------  Indications: Claudication, rest pain, and peripheral artery disease. High Risk Factors: Hypertension, current smoker.  Vascular Interventions: 06/19/2021 Thrombectomy of right  popliteal artery                         06/18/2021 Right femoral endarterectomy , thrombectomy                          and fasciotomy                         04/11/2021 Left femoral endarterectomy. Current ABI:            Right: 0.71, Left: 1.43 Performing Technologist: Delorise Shiner RVT  Examination Guidelines: A complete evaluation includes B-mode imaging, spectral Doppler, color Doppler, and power Doppler as needed of all accessible portions of each vessel. Bilateral testing is considered an integral part of a complete examination. Limited examinations for reoccurring indications may be performed as noted.  +----------+--------+-----+--------+----------+---------+ RIGHT     PSV cm/sRatioStenosisWaveform  Comments  +----------+--------+-----+--------+----------+---------+ CFA Distal80                   triphasic           +----------+--------+-----+--------+----------+---------+ SFA Prox  119                  triphasic           +----------+--------+-----+--------+----------+---------+ SFA Mid   109                  triphasic           +----------+--------+-----+--------+----------+---------+ SFA Distal165                  triphasic           +----------+--------+-----+--------+----------+---------+ POP Prox  43                   triphasic           +----------+--------+-----+--------+----------+---------+ POP Distal152                  triphasic           +----------+--------+-----+--------+----------+---------+ PTA Distal12                   monophasicHyperemic +----------+--------+-----+--------+----------+---------+ Hypoechoic structure seen at right groin with blood flow detected within, consistent with partially thrombosed pseudo-aneurysm.  Summary: Right: No stenosis identified from the common femoral artery to the popliteal space. Hypoechoic structure seen at inguinal space. Calf imaging limited by bandages and recent surgical sites.  See table(s) above for measurements and observations. Electronically signed by Deitra Mayo MD on 06/30/2021 at  3:28:45 PM.    Final    VAS [place] LOWER EXTREMITY VENOUS (DVT)  Result Date: 06/30/2021  Lower Venous DVT Study Patient Name:  Arthur Mcmahon  Date of Exam:   06/30/2021 Medical Rec #: [id number]       Accession #:    [id number] Date of Birth: 26-May-1966       Patient Gender: M Patient Age:   17 years Exam Location:  [facility] Procedure:      VAS [place] LOWER EXTREMITY VENOUS (DVT) Referring Phys: Aldona Bar RHYNE --------------------------------------------------------------------------------  Indications: Pain, Swelling, and Edema.  Risk Factors: Surgery 06/19/2021 Thrombectomy of right popliteal artery. Limitations: Bandages and Patient discomfort. Performing Technologist: Delorise Shiner RVT  Examination Guidelines: A complete evaluation includes B-mode imaging, spectral Doppler, color Doppler, and power Doppler as needed of all accessible portions of each vessel. Bilateral testing is considered  an integral part of a complete examination. Limited examinations for reoccurring indications may be performed as noted. The reflux portion of the exam is performed with the patient in reverse Trendelenburg.  +---------+---------------+---------+-----------+----------+--------------+ RIGHT    CompressibilityPhasicitySpontaneityPropertiesThrombus Aging +---------+---------------+---------+-----------+----------+--------------+ CFV      Full           No       Yes                                 +---------+---------------+---------+-----------+----------+--------------+ SFJ      Full           No       Yes                                 +---------+---------------+---------+-----------+----------+--------------+ FV Prox  Full           No       Yes                                 +---------+---------------+---------+-----------+----------+--------------+ FV Mid   Full           No       Yes                                  +---------+---------------+---------+-----------+----------+--------------+ FV DistalFull           No       Yes                                 +---------+---------------+---------+-----------+----------+--------------+ POP      Full           No       Yes                                 +---------+---------------+---------+-----------+----------+--------------+ PTV      Full                                                        +---------+---------------+---------+-----------+----------+--------------+ GSV      Full                                                        +---------+---------------+---------+-----------+----------+--------------+ SSV      Full                                                        +---------+---------------+---------+-----------+----------+--------------+ Large hypoechoic structure seen in right inguinal space with blood flow detected within. Little respiratory variation was noted. Limited imaging showed a patent distal IVC  +----+---------------+---------+-----------+----------+--------------+ LEFTCompressibilityPhasicitySpontaneityPropertiesThrombus Aging +----+---------------+---------+-----------+----------+--------------+ CFV Full  No       Yes                                 +----+---------------+---------+-----------+----------+--------------+ No respiratory variation noted at the left common femoral vein.    Summary: RIGHT: - There is no evidence of deep vein thrombosis in the lower extremity. - There is no evidence of superficial venous thrombosis.  - No cystic structure found in the popliteal fossa. - Non phasic Doppler signal may be consistent with proximal venous obstruction/ extrinsic compression.  LEFT: - No evidence of common femoral vein obstruction.  *See table(s) above for measurements and observations. Electronically signed by Deitra Mayo MD on 06/30/2021 at 3:28:19 PM.    Final      Assessment/Plan **hyponatremia:  Has e/o hypervolemia but I think the LE edema is more of a venous congestion related to the LN compression of veins.  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  I'm going to see if his urine sodium and osm are helpful today -- hopefully a sample taken directly from freshest tubing will be clear enough to give data.  TSH is fairly high (~14) so will start synthroid despite being acute setting.  Cortisol is fine.   For now will continue the 1040mL fluid restriction, normal diet and follow.  No indications for hypertonic saline.  I'll keep the q6h sodium checks for now.  **Bladder mass with nodal metastases: urology following, trying for OR for tissue dx Mon   **AKI:  mild nonoliguric and has essentially resolved - suspect multifactorial but obstruction playing a role.   **LE edema:  venous outflow obstruction from extrinsic compression from nodes.  Compression stockings, may end up needing a trial of diuretics.   Will follow, page with concerns.   Justin Mend 07/02/2021, 12:43 PM

## 2021-07-02 NOTE — ED Notes (Signed)
Pt's dressing on groin and R lower leg incision were changed and the incisions were cleaned by patting them off. A pressure injury padded dressing was applied to the pt's sacral area to cover the pre-existing pressure sore. Patient was repositioned

## 2021-07-02 NOTE — ED Notes (Signed)
MD at bedside. 

## 2021-07-02 NOTE — ED Notes (Signed)
Came into room at [phone] and noted that IV pump that was transfusion blood had been shut off by someone and infusion was complete. Unknown exactly when blood finished.

## 2021-07-02 NOTE — Consult Note (Addendum)
[facility] Nurse Consult Note: Reason for Consult:Stage 2 pressure injury to perineal area. Healing incisions (fasciotomies) to right LE and groin.  Dr. Stanford Breed saw last pm Wound type: Surgical, pressure  Pressure Injury POA: Yes Measurement:Sacrum: To be obtained by Bedside RN today and entered into Flow Sheet Wound MMH:WKGS, moist Drainage (amount, consistency, odor) scant serous Periwound:intact Dressing procedure/placement/frequency:I have provided Nursing with guidance for the care of these wounds via the Orders. Turning and repositioning is in place and time in the supine position is to be be minimized. A pressure redistribution chair pad is provide for when patient is OOB.  [place] nursing team will not follow, but will remain available to this patient, the nursing and medical teams.  Please re-consult if needed. Thanks, Maudie Flakes, MSN, RN, [place], Arther Abbott  Pager# [phone]

## 2021-07-03 ENCOUNTER — Inpatient Hospital Stay (HOSPITAL_COMMUNITY): Payer: Medicaid Other

## 2021-07-03 DIAGNOSIS — N179 Acute kidney failure, unspecified: Secondary | ICD-10-CM

## 2021-07-03 DIAGNOSIS — I739 Peripheral vascular disease, unspecified: Secondary | ICD-10-CM

## 2021-07-03 DIAGNOSIS — R7881 Bacteremia: Secondary | ICD-10-CM

## 2021-07-03 DIAGNOSIS — F1010 Alcohol abuse, uncomplicated: Secondary | ICD-10-CM

## 2021-07-03 DIAGNOSIS — E871 Hypo-osmolality and hyponatremia: Secondary | ICD-10-CM

## 2021-07-03 DIAGNOSIS — F17200 Nicotine dependence, unspecified, uncomplicated: Secondary | ICD-10-CM

## 2021-07-03 DIAGNOSIS — L8942 Pressure ulcer of contiguous site of back, buttock and hip, stage 2: Secondary | ICD-10-CM

## 2021-07-03 DIAGNOSIS — B955 Unspecified streptococcus as the cause of diseases classified elsewhere: Secondary | ICD-10-CM

## 2021-07-03 DIAGNOSIS — D62 Acute posthemorrhagic anemia: Secondary | ICD-10-CM

## 2021-07-03 LAB — SODIUM
Sodium: 123 mmol/L — ABNORMAL LOW (ref 135–145)
Sodium: 123 mmol/L — ABNORMAL LOW (ref 135–145)
Sodium: 125 mmol/L — ABNORMAL LOW (ref 135–145)

## 2021-07-03 LAB — BASIC METABOLIC PANEL
Anion gap: 7 (ref 5–15)
Anion gap: 6 (ref 5–15)
BUN: 11 mg/dL (ref 6–20)
BUN: 10 mg/dL (ref 6–20)
CO2: 26 mmol/L (ref 22–32)
CO2: 26 mmol/L (ref 22–32)
Calcium: 7.7 mg/dL — ABNORMAL LOW (ref 8.9–10.3)
Calcium: 7.7 mg/dL — ABNORMAL LOW (ref 8.9–10.3)
Chloride: 89 mmol/L — ABNORMAL LOW (ref 98–111)
Chloride: 90 mmol/L — ABNORMAL LOW (ref 98–111)
Creatinine, Ser: 0.93 mg/dL (ref 0.61–1.24)
Creatinine, Ser: 0.93 mg/dL (ref 0.61–1.24)
GFR, Estimated: >60 mL/min (ref >60)
GFR, Estimated: >60 mL/min (ref >60)
Glucose, Bld: 106 mg/dL — ABNORMAL HIGH (ref 70–99)
Glucose, Bld: 95 mg/dL (ref 70–99)
Potassium: 3.7 mmol/L (ref 3.5–5.1)
Potassium: 3.9 mmol/L (ref 3.5–5.1)
Sodium: 122 mmol/L — ABNORMAL LOW (ref 135–145)
Sodium: 122 mmol/L — ABNORMAL LOW (ref 135–145)

## 2021-07-03 LAB — SODIUM, URINE, RANDOM: Sodium, Ur: <10 mmol/L

## 2021-07-03 LAB — OSMOLALITY, URINE: Osmolality, Ur: 135 mOsm/kg — ABNORMAL LOW (ref 300–900)

## 2021-07-03 LAB — ECHOCARDIOGRAM COMPLETE
Area-P 1/2: 3.42 cm2
Height: 74 in
S' Lateral: 3.2 cm
Weight: 2992 oz

## 2021-07-03 LAB — TYPE AND SCREEN
ABO/RH(D): O NEG
Antibody Screen: NEGATIVE
Unit division: 0

## 2021-07-03 LAB — CULTURE, BLOOD (ROUTINE X 2)

## 2021-07-03 LAB — BPAM RBC
Blood Product Expiration Date: 202211022359
ISSUE DATE / TIME: 202210290629
Unit Type and Rh: 9500

## 2021-07-03 LAB — HEMOGLOBIN AND HEMATOCRIT, BLOOD
HCT: 23.4 % — ABNORMAL LOW (ref 39.0–52.0)
HCT: 23.2 % — ABNORMAL LOW (ref 39.0–52.0)
Hemoglobin: 8 g/dL — ABNORMAL LOW (ref 13.0–17.0)
Hemoglobin: 7.9 g/dL — ABNORMAL LOW (ref 13.0–17.0)

## 2021-07-03 MED ORDER — SODIUM CHLORIDE 0.9 % IV SOLN: INTRAVENOUS | Status: DC

## 2021-07-03 MED ORDER — ALBUMIN HUMAN 25 % IV SOLN
12.5000 g | Freq: Once | INTRAVENOUS | Status: AC
  Administered 2021-07-03: 12.5 g via INTRAVENOUS
  Filled 2021-07-03: qty 50

## 2021-07-03 NOTE — TOC Initial Note (Signed)
Transition of Care [facility]) - Initial/Assessment Note    Patient Details  Name: Arthur Mcmahon MRN: [id number] Date of Birth: 07/08/1966  [facility] CM/SW Contact:    Dessa Phi, RN Phone Number: 07/03/2021, 4:53 PM  Clinical Narrative: From home, no health insurance, has pcp. XK:PVVZ/SMOLMBEML abuse.Continue to follow for d/c needs.                  Expected Discharge Plan: Home/Self Care Barriers to Discharge: Continued Medical Work up   Patient Goals and CMS Choice        Expected Discharge Plan and Services Expected Discharge Plan: Home/Self Care                                              Prior Living Arrangements/Services                       Activities of Daily Living Home Assistive Devices/Equipment: Environmental consultant (specify type) ADL Screening (condition at time of admission) Patient's cognitive ability adequate to safely complete daily activities?: Yes Is the patient deaf or have difficulty hearing?: No Does the patient have difficulty seeing, even when wearing glasses/contacts?: No Does the patient have difficulty concentrating, remembering, or making decisions?: No Patient able to express need for assistance with ADLs?: Yes Does the patient have difficulty dressing or bathing?: Yes Independently performs ADLs?: No Communication: Independent Dressing (OT): Needs assistance Grooming: Needs assistance Feeding: Independent Bathing: Needs assistance Toileting: Needs assistance In/Out Bed: Needs assistance Walks in Home: Needs assistance Does the patient have difficulty walking or climbing stairs?: Yes Weakness of Legs: Both Weakness of Arms/Hands: None  Permission Sought/Granted                  Emotional Assessment              Admission diagnosis:  Hyponatremia [E87.1] Bladder mass [N32.89] Hematuria, unspecified type [R31.9] Patient Active Problem List   Diagnosis Date Noted   Pressure injury of skin  07/02/2021   ARF (acute renal failure) ([place]) 07/01/2021   Acute blood loss anemia 07/01/2021   Hematuria 07/01/2021   Hyponatremia 06/30/2021   Status post surgery 06/18/2021   Ischemia of extremity 06/18/2021   PAOD (peripheral arterial occlusive disease) ([place]) 04/11/2021   Tobacco use disorder 04/08/2021   Hematuria with proteinuria 03/26/2021   Peripheral arterial disease ([place]) 03/26/2021   Foot pain, bilateral 03/26/2021   Discoloration of skin of toe 03/26/2021   PCP:  Shary Key, DO Pharmacy:   [facility] ([place] [place] - [address] [place] ([place]) [place] [address] Phone: [phone] Fax: [phone]  [facility] [address] [place] [address] Phone: [phone] Fax: [phone]     Social Determinants of Health (SDOH) Interventions    Readmission Risk Interventions Readmission Risk Prevention Plan 06/22/2021  Post Dischage Appt Complete  Medication Screening Complete  Transportation Screening Complete  Some recent data might be hidden

## 2021-07-03 NOTE — Progress Notes (Signed)
*  PRELIMINARY RESULTS* Echocardiogram 2D Echocardiogram has been performed.  Arthur Mcmahon 07/03/2021, 3:03 PM

## 2021-07-03 NOTE — Progress Notes (Signed)
Subjective: Patient reports catheter clogged with a small clot.  I had nurse irrigate Foley and it is clear now and draining.  Looks much lighter than yesterday.  Objective: Vital signs in last 24 hours: Temp:  [97.9 F (36.6 C)-98.4 F (36.9 C)] 97.9 F (36.6 C) (10/30 1250) Pulse Rate:  [67-97] 97 (10/30 1250) Resp:  [11-24] 18 (10/30 1250) BP: (101-115)/(56-72) 115/70 (10/30 1250) SpO2:  [98 %-100 %] 98 % (10/30 1250)  Intake/Output from previous day: 10/29 0701 - 10/30 0700 In: 375 [Blood:275; IV Piggyback:100] Out: 4200 [Urine:4200] Intake/Output this shift: Total I/O In: -  Out: 550 [Urine:550]  Physical Exam:  No acute distress, looks well, alert and oriented Getting an echocardiogram GU-urine light red in tubing  Lab Results: Recent Labs    07/02/21 1209 07/02/21 1714 07/03/21 0128  HGB 8.6* 8.4* 8.0*  HCT 25.5* 25.0* 23.4*   BMET Recent Labs    07/03/21 0128 07/03/21 0545 07/03/21 1238  NA 122* 122* 123*  K 3.7 3.9  --   CL 89* 90*  --   CO2 26 26  --   GLUCOSE 106* 95  --   BUN 11 10  --   CREATININE 0.93 0.93  --   CALCIUM 7.7* 7.7*  --    No results for input(s): LABPT, INR in the last 72 hours. No results for input(s): LABURIN in the last 72 hours. Results for orders placed or performed during the hospital encounter of 06/30/21  Blood culture (routine x 2)     Status: Abnormal   Collection Time: 06/30/21  5:58 PM   Specimen: Site Not Specified; Blood  Result Value Ref Range Status   Specimen Description SITE NOT SPECIFIED  Final   Special Requests   Final    BOTTLES DRAWN AEROBIC AND ANAEROBIC Blood Culture results may not be optimal due to an excessive volume of blood received in culture bottles   Culture  Setup Time   Final    GRAM POSITIVE COCCI IN CHAINS AEROBIC BOTTLE ONLY CRITICAL RESULT CALLED TO, READ BACK BY AND VERIFIED WITH: PHARMD E BREWING [id number] AT 70 AM BY CM Performed at [facility], [place] [address]  [place], [place] [address]    Culture STREPTOCOCCUS GROUP G (A)  Final   Report Status 07/03/2021 FINAL  Final   Organism ID, Bacteria STREPTOCOCCUS GROUP G  Final      Susceptibility   Streptococcus group g - MIC*    CLINDAMYCIN <=0.25 SENSITIVE Sensitive     AMPICILLIN <=0.25 SENSITIVE Sensitive     ERYTHROMYCIN <=0.12 SENSITIVE Sensitive     VANCOMYCIN 0.5 SENSITIVE Sensitive     CEFTRIAXONE <=0.12 SENSITIVE Sensitive     LEVOFLOXACIN <=0.25 SENSITIVE Sensitive     * STREPTOCOCCUS GROUP G  Blood Culture ID Panel (Reflexed)     Status: Abnormal   Collection Time: 06/30/21  5:58 PM  Result Value Ref Range Status   Enterococcus faecalis NOT DETECTED NOT DETECTED Final   Enterococcus Faecium NOT DETECTED NOT DETECTED Final   Listeria monocytogenes NOT DETECTED NOT DETECTED Final   Staphylococcus species NOT DETECTED NOT DETECTED Final   Staphylococcus aureus (BCID) NOT DETECTED NOT DETECTED Final   Staphylococcus epidermidis NOT DETECTED NOT DETECTED Final   Staphylococcus lugdunensis NOT DETECTED NOT DETECTED Final   Streptococcus species DETECTED (A) NOT DETECTED Final    Comment: Not Enterococcus species, Streptococcus agalactiae, Streptococcus pyogenes, or Streptococcus pneumoniae. CRITICAL RESULT CALLED TO, READ BACK BY AND VERIFIED  WITH: PHARMD E BREWING [id number] AT 837 AM BY CM    Streptococcus agalactiae NOT DETECTED NOT DETECTED Final   Streptococcus pneumoniae NOT DETECTED NOT DETECTED Final   Streptococcus pyogenes NOT DETECTED NOT DETECTED Final   A.calcoaceticus-baumannii NOT DETECTED NOT DETECTED Final   Bacteroides fragilis NOT DETECTED NOT DETECTED Final   Enterobacterales NOT DETECTED NOT DETECTED Final   Enterobacter cloacae complex NOT DETECTED NOT DETECTED Final   Escherichia coli NOT DETECTED NOT DETECTED Final   Klebsiella aerogenes NOT DETECTED NOT DETECTED Final   Klebsiella oxytoca NOT DETECTED NOT DETECTED Final   Klebsiella pneumoniae NOT DETECTED NOT DETECTED  Final   Proteus species NOT DETECTED NOT DETECTED Final   Salmonella species NOT DETECTED NOT DETECTED Final   Serratia marcescens NOT DETECTED NOT DETECTED Final   Haemophilus influenzae NOT DETECTED NOT DETECTED Final   Neisseria meningitidis NOT DETECTED NOT DETECTED Final   Pseudomonas aeruginosa NOT DETECTED NOT DETECTED Final   Stenotrophomonas maltophilia NOT DETECTED NOT DETECTED Final   Candida albicans NOT DETECTED NOT DETECTED Final   Candida auris NOT DETECTED NOT DETECTED Final   Candida glabrata NOT DETECTED NOT DETECTED Final   Candida krusei NOT DETECTED NOT DETECTED Final   Candida parapsilosis NOT DETECTED NOT DETECTED Final   Candida tropicalis NOT DETECTED NOT DETECTED Final   Cryptococcus neoformans/gattii NOT DETECTED NOT DETECTED Final    Comment: Performed at [facility], [address], [place], [place] [address]  Blood culture (routine x 2)     Status: None (Preliminary result)   Collection Time: 07/01/21 12:16 AM   Specimen: BLOOD RIGHT FOREARM  Result Value Ref Range Status   Specimen Description BLOOD RIGHT FOREARM  Final   Special Requests   Final    BOTTLES DRAWN AEROBIC AND ANAEROBIC Blood Culture adequate volume   Culture   Final    NO GROWTH 2 DAYS Performed at [facility], [place] [address] [place], [place] [address]    Report Status PENDING  Incomplete  Resp Panel by RT-PCR (Flu A&B, Covid) Nasopharyngeal Swab     Status: None   Collection Time: 07/01/21 12:21 AM   Specimen: Nasopharyngeal Swab; Nasopharyngeal(NP) swabs in vial transport medium  Result Value Ref Range Status   SARS Coronavirus 2 by RT PCR NEGATIVE NEGATIVE Final    Comment: (NOTE) SARS-CoV-2 target nucleic acids are NOT DETECTED.  The SARS-CoV-2 RNA is generally detectable in upper respiratory specimens during the acute phase of infection. The lowest concentration of SARS-CoV-2 viral copies this assay can detect is 138 copies/mL. A negative result does not  preclude SARS-Cov-2 infection and should not be used as the sole basis for treatment or other patient management decisions. A negative result may occur with  improper specimen collection/handling, submission of specimen other than nasopharyngeal swab, presence of viral mutation(s) within the areas targeted by this assay, and inadequate number of viral copies(<138 copies/mL). A negative result must be combined with clinical observations, patient history, and epidemiological information. The expected result is Negative.  Fact Sheet for Patients:  EntrepreneurPulse.com.au  Fact Sheet for Healthcare Providers:  IncredibleEmployment.be  This test is no t yet approved or cleared by the [place] FDA and  has been authorized for detection and/or diagnosis of SARS-CoV-2 by FDA under an Emergency Use Authorization (EUA). This EUA will remain  in effect (meaning this test can be used) for the duration of the COVID-19 declaration under Section 564(b)(1) of the Act, 21 U.S.C.section 360bbb-3(b)(1), unless  the authorization is terminated  or revoked sooner.       Influenza A by PCR NEGATIVE NEGATIVE Final   Influenza B by PCR NEGATIVE NEGATIVE Final    Comment: (NOTE) The Xpert Xpress SARS-CoV-2/FLU/RSV plus assay is intended as an aid in the diagnosis of influenza from Nasopharyngeal swab specimens and should not be used as a sole basis for treatment. Nasal washings and aspirates are unacceptable for Xpert Xpress SARS-CoV-2/FLU/RSV testing.  Fact Sheet for Patients: EntrepreneurPulse.com.au  Fact Sheet for Healthcare Providers: IncredibleEmployment.be  This test is not yet approved or cleared by the [place] FDA and has been authorized for detection and/or diagnosis of SARS-CoV-2 by FDA under an Emergency Use Authorization (EUA). This EUA will remain in effect (meaning this test can be used) for the  duration of the COVID-19 declaration under Section 564(b)(1) of the Act, 21 U.S.C. section 360bbb-3(b)(1), unless the authorization is terminated or revoked.  Performed at [facility], [place] [address], [place], [place] [address]     Studies/Results: CT CHEST W CONTRAST  Result Date: 07/01/2021 CLINICAL DATA:  Gross hematuria, shortness of breath, urologic cancer, staging EXAM: CT CHEST WITH CONTRAST TECHNIQUE: Multidetector CT imaging of the chest was performed during intravenous contrast administration. CONTRAST:  183mL OMNIPAQUE IOHEXOL 350 MG/ML SOLN COMPARISON:  06/18/2021 FINDINGS: Cardiovascular: The heart and great vessels are unremarkable without pericardial effusion. Extensive atherosclerosis of the coronary vasculature. No evidence of thoracic aortic aneurysm or dissection. While not optimized for opacification of the pulmonary vasculature, there is sufficient contrast enhancement to exclude pulmonary emboli. No filling defects. Mediastinum/Nodes: Extensive pathologic adenopathy is seen within the left supraclavicular region. Largest lymph node mass on image 11/3 measures 1.7 x 3.5 cm. Additionally, there are numerous pathologic lymph nodes within the left axilla, largest measuring up to 1.5 cm in short axis reference image 23/3. Pathologic adenopathy is seen within the left para-aortic region, largest lymph node measuring 1.3 cm in short axis reference image 135/3. No pathologic adenopathy within the mediastinum, hila, or right axilla. The thyroid, trachea, and esophagus are unremarkable. Lungs/Pleura: No acute airspace disease, effusion, or pneumothorax. Mild right lower lobe bronchial wall thickening and mucous plugging. Remaining central airways are patent. No pulmonary nodules or masses. Upper Abdomen: Enlarged right retrocrural lymph node measures 1.1 cm in short axis, reference image 154/3. Subcentimeter retrocaval lymph nodes are also noted. Findings are consistent with  metastatic disease, please refer to CT abdomen report. Musculoskeletal: There are no acute or destructive bony lesions. Reconstructed images demonstrate no additional findings. IMPRESSION: 1. Pathologic adenopathy within the left supraclavicular region, left axilla, and left para-aortic region, consistent with metastatic disease given patient's known history of urologic malignancy. 2. Borderline enlarged lymph nodes within the retrocrural region and retroperitoneum, consistent with metastatic disease. Please refer to CT abdomen report for full description. 3. Minimal right lower lobe bronchial wall thickening and mucous plugging, which may reflect reactive airway disease. No acute airspace disease. 4.  Aortic Atherosclerosis (ICD10-I70.0). Electronically Signed   By: Randa Ngo M.D.   On: 07/01/2021 17:39   CT HEMATURIA WORKUP  Result Date: 07/01/2021 CLINICAL DATA:  Gross hematuria. EXAM: CT ABDOMEN AND PELVIS WITHOUT AND WITH CONTRAST TECHNIQUE: Multidetector CT imaging of the abdomen and pelvis was performed following the standard protocol before and following the bolus administration of intravenous contrast. CONTRAST:  177mL OMNIPAQUE IOHEXOL 350 MG/ML SOLN COMPARISON:  None. FINDINGS: Lower chest: The lung bases are clear of acute process. No pleural effusion or pulmonary  lesions. The heart is normal in size. No pericardial effusion. The distal esophagus and aorta are unremarkable. Hepatobiliary: No hepatic lesions to suggest metastatic disease. No intrahepatic biliary dilatation. The gallbladder is unremarkable. No common bile duct dilatation. Pancreas: No mass, inflammation or ductal dilatation. Spleen: Normal size.  No focal lesions. Adrenals/Urinary Tract: The adrenal glands are unremarkable. Left renal artery calcifications but no definite renal calculi. There is a simple left renal cyst noted. The right kidney demonstrates marked hydronephrosis and there is also marked right hydroureter down into  the pelvis. High attenuation material noted in distal third of the right ureter which could be blood or tumor. No right renal lesions are identified. Decreased perfusion because of the obstruction. There is a large irregular right-sided bladder mass likely obstructing the right ureter. The left ureter is patent. Associated extensive pelvic lymphadenopathy the. Stomach/Bowel: The stomach, duodenum, small bowel and colon are grossly normal. Vascular/Lymphatic: Age advanced vascular disease but no aneurysm or dissection. Extensive retroperitoneal and pelvic adenopathy as demonstrated on the recent study from 06/18/2021. Largest nodal mass is in the right external iliac region and measures 4.9 cm. The right pelvic sidewall adenopathy measures 2.5 cm. Partially necrotic retroperitoneal adenopathy surrounding the aorta. The largest measures 18 mm on image 39/8. Reproductive: The prostate gland and seminal vesicles are unremarkable. Other: There is diffuse lower pelvic body wall edema asymmetric on the right side and also bobbing the upper thigh. This is most likely due to compression of the right femoral vein due to the bulk E right femoral adenopathy measuring a maximum of 3 cm on image 80/8 Musculoskeletal: No findings suspicious for osseous metastatic disease. IMPRESSION: 1. Large (7 cm) irregular right-sided bladder mass likely obstructing the right ureter with marked right-sided hydroureteronephrosis. High attenuation material in the distal right ureter could be blood or tumor. 2. Extensive retroperitoneal and pelvic adenopathy. 3. No findings suspicious for metastatic disease involving the lung bases, liver or bony structures. 4. Age advanced vascular disease. 5. Compression of the right common femoral vein by adenopathy causing right lower extremity swelling/edema. Electronically Signed   By: Marijo Sanes M.D.   On: 07/01/2021 18:01    Assessment/Plan: 55yM with presumed [id number] bladder cancer with large  bladder mass and bulky RP, pelvic, iliac lymphadenopathy seen on recent CT A/P.  No bone lesions on CT 10/28. Extensive retroperitoneal and pelvic lymphadenopathy with right hydronephrosis from right bladder mass.  Chest clear.  Planning TURBT for tissue diagnosis on tomorrow afternoon. NPO after midnight tonight. Holding anticoagulation per vascular. Echo results pending. Pre-op orders released.    LOS: 3 days   Arthur Mcmahon 07/03/2021, 2:53 PM

## 2021-07-03 NOTE — Progress Notes (Signed)
PROGRESS NOTE  Arthur Mcmahon  QBH:[id number] DOB: Aug 13, 1966 DOA: 06/30/2021 PCP: Shary Key, DO   Brief Narrative: Arthur Mcmahon is a 55 y.o. male with a history of endarterectomy and fistulotomy with thrombectomy for critical right LE ischemia started on eliquis who was sent to the ED 10/27 for recurrent gross hematuria, generalized weakness, lethargy. He was found to have low grade fever, WBC 26.1k and blood culture drawn grew group G strep for which ceftriaxone has been given. AKI was noted with SCr 1.56, severe hyponatremia also noted with Na 115 for which nephrology was consulted. Hgb was 9g/dl, trended downward to 7 for which 1u PRBCs was given 10/29.   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 Chest CT revealed pathologic adenopathy within the left supraclavicular region, left axilla, and left para-aortic region. Borderline enlarged lymph nodes within the retrocrural region and retroperitoneum - Urology planning TURBT 10/31. Will make sure they're aware of bacteremia.  - Note CT scanning this admission   Critical RLE ischemia and swelling: s/p revascularization by vascular surgery PTA. Compression of the right common femoral vein by adenopathy causing right lower extremity swelling/edema seen on CT. - Vascular surgery consulted and has evaluated the patient. Will continue wound vac to inguinal incision with suspected lymphatic drainage. Routine wound care recommended for lower wounds. - Elevate extremity as able  Acute blood loss anemia with ongoing gross hematuria: s/p 1u PRBCs 10/29. - Trend H/H, transfuse if symptomatic or hgb < 7 g/dl. - Ok to hold  anticoagulation per vascular surgery.  Hyponatremia: Urine Na (?reliable with gross hematuria) is low. Has been improving slowly. - Appreciate nephrology recommendations.  - Continue fluid restriction  Group G strep bacteremia: On admission aerobic collection, not in other collections. Leukocytosis improving on ceftriaxone 2g to which susceptibility has been proven on culture.  - I appreciate ID input regarding this case. I've ordered an echocardiogram.  - Continue CTX 2g IV q24h.  AKI: CrCl has improved significantly >80ml/min not consistent with CKD. Right hydronephrosis noted.  - Monitor BMP, avoid nephrotoxins/further contrast if possible  Tobacco use:  - Cessation counseling.  RN Pressure Injury Documentation: Pressure Injury 07/01/21 Perineum Mid Stage 2 -  Partial thickness loss of dermis presenting as a shallow open injury with a red, pink wound bed without slough. wound is red with skin break down in the middle of the injury (Active)  07/01/21 2312  Location: Perineum  Location Orientation: Mid  Staging: Stage 2 -  Partial thickness loss of dermis presenting as a shallow open injury with a red, pink wound bed without slough.  Wound Description (Comments): wound is red with skin break down in the middle of the injury  Present on Admission: Yes   DVT prophylaxis: SCDs Code Status: Full Family Communication: None at bedside Disposition Plan:  Status is: Inpatient  Remains inpatient appropriate because: Severe hyponatremia, bacteremia, transfusion-dependent anemia w/ongoing hematuria.  Consultants:  Urology Vascular surgery Nephrology Infectious disease  Procedures:  TURBT planned  Antimicrobials: Ceftriaxone   Subjective: No back pain. Having some numbness/swelling in feet. No weakness. No fevers or chills. Vac starting to leak as it is full.  Objective: Vitals:   07/02/21 2109 07/03/21 0457  07/03/21 0800 07/03/21 0858  BP: 112/62 101/65    Pulse: 82 79     Resp: 18 18 (!) 24 20  Temp: 98.2 F (36.8 C) 98.4 F (36.9 C)    TempSrc: Oral Oral    SpO2: 98% 100%    Weight:      Height:        Intake/Output Summary (Last 24 hours) at 07/03/2021 1021 Last data filed at 07/03/2021 0132 Gross per 24 hour  Intake 100 ml  Output 2500 ml  Net -2400 ml   Filed Weights   07/01/21 1906  Weight: 84.8 kg   Gen: 55 y.o. male in no distress appearing older than stated age Pulm: Non-labored breathing room air. Clear to auscultation bilaterally.  CV: Regular rate and rhythm. No murmur, rub, or gallop. No JVD, 1+ bilateral R > L pedal edema. GI: Abdomen soft, non-tender, non-distended, with normoactive bowel sounds. No organomegaly or masses felt. Ext: Warm, dry with intact cap refill, no deformities Skin: Right inguinal wound with serous discharge in vac, no surrounding erythema or purulence noted. Right lower leg with dressings c/d/i Neuro: Alert and oriented. No focal neurological deficits. Psych: Judgement and insight appear normal. Mood & affect appropriate.   Data Reviewed: I have personally reviewed following labs and imaging studies  CBC: Recent Labs  Lab 06/30/21 1758 07/01/21 0412 07/01/21 0655 07/01/21 1149 07/02/21 0246 07/02/21 1209 07/02/21 1714 07/03/21 0128  WBC 26.1* 16.3* 15.6* 15.2* 11.3*  --   --   --   NEUTROABS 24.0*  --   --   --   --   --   --   --   HGB 9.0* 7.6* 7.3* 7.4* 7.0* 8.6* 8.4* 8.0*  HCT 26.2* 21.3* 21.7* 21.1* 19.8* 25.5* 25.0* 23.4*  MCV 94.2 93.8 95.2 93.4 93.4  --   --   --   PLT 498* 389 382 337 338  --   --   --    Basic Metabolic Panel: Recent Labs  Lab 07/01/21 1924 07/02/21 0246 07/02/21 0609 07/02/21 1209 07/03/21 0128 07/03/21 0545  NA 117*  --  120* 121* 122* 122*  K 4.0  --  3.8 4.1 3.7 3.9  CL 87*  --  89* 88* 89* 90*  CO2 18*  --  23 24 26 26   GLUCOSE 75  --  99 86 106* 95  BUN 14  --  11 11 11 10   CREATININE 1.20  --  1.09 1.07 0.93 0.93  CALCIUM 7.5*  --  7.7* 7.7* 7.7*  7.7*  MG  --  2.0  --   --   --   --   PHOS  --  2.2*  --   --   --   --    GFR: Estimated Creatinine Clearance: 105.6 mL/min (by C-G formula based on SCr of 0.93 mg/dL). Liver Function Tests: Recent Labs  Lab 06/30/21 1758  AST 21  ALT 13  ALKPHOS 85  BILITOT 1.3*  PROT 7.2  ALBUMIN 2.4*   No results for input(s): LIPASE, AMYLASE in the last 168 hours. No results for input(s): AMMONIA in the last 168 hours. Coagulation Profile: No results for input(s): INR, PROTIME in the last 168 hours. Cardiac Enzymes: No results for input(s): CKTOTAL, CKMB, CKMBINDEX, TROPONINI in the last 168 hours. BNP (last 3 results) No results for input(s): PROBNP in the last 8760 hours. HbA1C: No results for input(s): HGBA1C in the last 72 hours. CBG: Recent Labs  Lab  07/02/21 2154  GLUCAP 117*   Lipid Profile: No results for input(s): CHOL, HDL, LDLCALC, TRIG, CHOLHDL, LDLDIRECT in the last 72 hours. Thyroid Function Tests: Recent Labs    07/01/21 0412  TSH 13.445*   Anemia Panel: No results for input(s): VITAMINB12, FOLATE, FERRITIN, TIBC, IRON, RETICCTPCT in the last 72 hours. Urine analysis:    Component Value Date/Time   COLORURINE RED (A) 07/01/2021 0020   APPEARANCEUR TURBID (A) 07/01/2021 0020   LABSPEC  07/01/2021 0020    TEST NOT REPORTED DUE TO COLOR INTERFERENCE OF URINE PIGMENT   PHURINE  07/01/2021 0020    TEST NOT REPORTED DUE TO COLOR INTERFERENCE OF URINE PIGMENT   GLUCOSEU (A) 07/01/2021 0020    TEST NOT REPORTED DUE TO COLOR INTERFERENCE OF URINE PIGMENT   HGBUR (A) 07/01/2021 0020    TEST NOT REPORTED DUE TO COLOR INTERFERENCE OF URINE PIGMENT   BILIRUBINUR (A) 07/01/2021 0020    TEST NOT REPORTED DUE TO COLOR INTERFERENCE OF URINE PIGMENT   BILIRUBINUR small (A) 03/25/2021 1415   KETONESUR (A) 07/01/2021 0020    TEST NOT REPORTED DUE TO COLOR INTERFERENCE OF URINE PIGMENT   PROTEINUR (A) 07/01/2021 0020    TEST NOT REPORTED DUE TO COLOR INTERFERENCE OF URINE  PIGMENT   UROBILINOGEN 1.0 03/25/2021 1415   NITRITE (A) 07/01/2021 0020    TEST NOT REPORTED DUE TO COLOR INTERFERENCE OF URINE PIGMENT   LEUKOCYTESUR (A) 07/01/2021 0020    TEST NOT REPORTED DUE TO COLOR INTERFERENCE OF URINE PIGMENT   Recent Results (from the past 240 hour(s))  Blood culture (routine x 2)     Status: Abnormal   Collection Time: 06/30/21  5:58 PM   Specimen: Site Not Specified; Blood  Result Value Ref Range Status   Specimen Description SITE NOT SPECIFIED  Final   Special Requests   Final    BOTTLES DRAWN AEROBIC AND ANAEROBIC Blood Culture results may not be optimal due to an excessive volume of blood received in culture bottles   Culture  Setup Time   Final    GRAM POSITIVE COCCI IN CHAINS AEROBIC BOTTLE ONLY CRITICAL RESULT CALLED TO, READ BACK BY AND VERIFIED WITH: PHARMD E BREWING [id number] AT 21 AM BY CM Performed at [facility], [place] [address], [place], [place] [address]    Culture STREPTOCOCCUS GROUP G (A)  Final   Report Status 07/03/2021 FINAL  Final   Organism ID, Bacteria STREPTOCOCCUS GROUP G  Final      Susceptibility   Streptococcus group g - MIC*    CLINDAMYCIN <=0.25 SENSITIVE Sensitive     AMPICILLIN <=0.25 SENSITIVE Sensitive     ERYTHROMYCIN <=0.12 SENSITIVE Sensitive     VANCOMYCIN 0.5 SENSITIVE Sensitive     CEFTRIAXONE <=0.12 SENSITIVE Sensitive     LEVOFLOXACIN <=0.25 SENSITIVE Sensitive     * STREPTOCOCCUS GROUP G  Blood Culture ID Panel (Reflexed)     Status: Abnormal   Collection Time: 06/30/21  5:58 PM  Result Value Ref Range Status   Enterococcus faecalis NOT DETECTED NOT DETECTED Final   Enterococcus Faecium NOT DETECTED NOT DETECTED Final   Listeria monocytogenes NOT DETECTED NOT DETECTED Final   Staphylococcus species NOT DETECTED NOT DETECTED Final   Staphylococcus aureus (BCID) NOT DETECTED NOT DETECTED Final   Staphylococcus epidermidis NOT DETECTED NOT DETECTED Final   Staphylococcus lugdunensis NOT DETECTED NOT  DETECTED Final   Streptococcus species DETECTED (A) NOT DETECTED Final    Comment: Not  Enterococcus species, Streptococcus agalactiae, Streptococcus pyogenes, or Streptococcus pneumoniae. CRITICAL RESULT CALLED TO, READ BACK BY AND VERIFIED WITH: PHARMD E BREWING [id number] AT 11 AM BY CM    Streptococcus agalactiae NOT DETECTED NOT DETECTED Final   Streptococcus pneumoniae NOT DETECTED NOT DETECTED Final   Streptococcus pyogenes NOT DETECTED NOT DETECTED Final   A.calcoaceticus-baumannii NOT DETECTED NOT DETECTED Final   Bacteroides fragilis NOT DETECTED NOT DETECTED Final   Enterobacterales NOT DETECTED NOT DETECTED Final   Enterobacter cloacae complex NOT DETECTED NOT DETECTED Final   Escherichia coli NOT DETECTED NOT DETECTED Final   Klebsiella aerogenes NOT DETECTED NOT DETECTED Final   Klebsiella oxytoca NOT DETECTED NOT DETECTED Final   Klebsiella pneumoniae NOT DETECTED NOT DETECTED Final   Proteus species NOT DETECTED NOT DETECTED Final   Salmonella species NOT DETECTED NOT DETECTED Final   Serratia marcescens NOT DETECTED NOT DETECTED Final   Haemophilus influenzae NOT DETECTED NOT DETECTED Final   Neisseria meningitidis NOT DETECTED NOT DETECTED Final   Pseudomonas aeruginosa NOT DETECTED NOT DETECTED Final   Stenotrophomonas maltophilia NOT DETECTED NOT DETECTED Final   Candida albicans NOT DETECTED NOT DETECTED Final   Candida auris NOT DETECTED NOT DETECTED Final   Candida glabrata NOT DETECTED NOT DETECTED Final   Candida krusei NOT DETECTED NOT DETECTED Final   Candida parapsilosis NOT DETECTED NOT DETECTED Final   Candida tropicalis NOT DETECTED NOT DETECTED Final   Cryptococcus neoformans/gattii NOT DETECTED NOT DETECTED Final    Comment: Performed at [facility], [address], [place], [place] [address]  Blood culture (routine x 2)     Status: None (Preliminary result)   Collection Time: 07/01/21 12:16 AM   Specimen: BLOOD RIGHT FOREARM  Result Value Ref  Range Status   Specimen Description BLOOD RIGHT FOREARM  Final   Special Requests   Final    BOTTLES DRAWN AEROBIC AND ANAEROBIC Blood Culture adequate volume   Culture   Final    NO GROWTH 2 DAYS Performed at [facility], [place] [address] [place], [place] [address]    Report Status PENDING  Incomplete  Resp Panel by RT-PCR (Flu A&B, Covid) Nasopharyngeal Swab     Status: None   Collection Time: 07/01/21 12:21 AM   Specimen: Nasopharyngeal Swab; Nasopharyngeal(NP) swabs in vial transport medium  Result Value Ref Range Status   SARS Coronavirus 2 by RT PCR NEGATIVE NEGATIVE Final    Comment: (NOTE) SARS-CoV-2 target nucleic acids are NOT DETECTED.  The SARS-CoV-2 RNA is generally detectable in upper respiratory specimens during the acute phase of infection. The lowest concentration of SARS-CoV-2 viral copies this assay can detect is 138 copies/mL. A negative result does not preclude SARS-Cov-2 infection and should not be used as the sole basis for treatment or other patient management decisions. A negative result may occur with  improper specimen collection/handling, submission of specimen other than nasopharyngeal swab, presence of viral mutation(s) within the areas targeted by this assay, and inadequate number of viral copies(<138 copies/mL). A negative result must be combined with clinical observations, patient history, and epidemiological information. The expected result is Negative.  Fact Sheet for Patients:  EntrepreneurPulse.com.au  Fact Sheet for Healthcare Providers:  IncredibleEmployment.be  This test is no t yet approved or cleared by the [place] FDA and  has been authorized for detection and/or diagnosis of SARS-CoV-2 by FDA under an Emergency Use Authorization (EUA). This EUA will remain  in effect (meaning this test can be used)  for the duration of the COVID-19 declaration under Section 564(b)(1) of the Act,  21 U.S.C.section 360bbb-3(b)(1), unless the authorization is terminated  or revoked sooner.       Influenza A by PCR NEGATIVE NEGATIVE Final   Influenza B by PCR NEGATIVE NEGATIVE Final    Comment: (NOTE) The Xpert Xpress SARS-CoV-2/FLU/RSV plus assay is intended as an aid in the diagnosis of influenza from Nasopharyngeal swab specimens and should not be used as a sole basis for treatment. Nasal washings and aspirates are unacceptable for Xpert Xpress SARS-CoV-2/FLU/RSV testing.  Fact Sheet for Patients: EntrepreneurPulse.com.au  Fact Sheet for Healthcare Providers: IncredibleEmployment.be  This test is not yet approved or cleared by the [place] FDA and has been authorized for detection and/or diagnosis of SARS-CoV-2 by FDA under an Emergency Use Authorization (EUA). This EUA will remain in effect (meaning this test can be used) for the duration of the COVID-19 declaration under Section 564(b)(1) of the Act, 21 U.S.C. section 360bbb-3(b)(1), unless the authorization is terminated or revoked.  Performed at [facility], [place] [address] [place], [place] [address]       Radiology Studies: CT CHEST W CONTRAST  Result Date: 07/01/2021 CLINICAL DATA:  Gross hematuria, shortness of breath, urologic cancer, staging EXAM: CT CHEST WITH CONTRAST TECHNIQUE: Multidetector CT imaging of the chest was performed during intravenous contrast administration. CONTRAST:  115mL OMNIPAQUE IOHEXOL 350 MG/ML SOLN COMPARISON:  06/18/2021 FINDINGS: Cardiovascular: The heart and great vessels are unremarkable without pericardial effusion. Extensive atherosclerosis of the coronary vasculature. No evidence of thoracic aortic aneurysm or dissection. While not optimized for opacification of the pulmonary vasculature, there is sufficient contrast enhancement to exclude pulmonary emboli. No filling defects. Mediastinum/Nodes: Extensive pathologic adenopathy is seen  within the left supraclavicular region. Largest lymph node mass on image 11/3 measures 1.7 x 3.5 cm. Additionally, there are numerous pathologic lymph nodes within the left axilla, largest measuring up to 1.5 cm in short axis reference image 23/3. Pathologic adenopathy is seen within the left para-aortic region, largest lymph node measuring 1.3 cm in short axis reference image 135/3. No pathologic adenopathy within the mediastinum, hila, or right axilla. The thyroid, trachea, and esophagus are unremarkable. Lungs/Pleura: No acute airspace disease, effusion, or pneumothorax. Mild right lower lobe bronchial wall thickening and mucous plugging. Remaining central airways are patent. No pulmonary nodules or masses. Upper Abdomen: Enlarged right retrocrural lymph node measures 1.1 cm in short axis, reference image 154/3. Subcentimeter retrocaval lymph nodes are also noted. Findings are consistent with metastatic disease, please refer to CT abdomen report. Musculoskeletal: There are no acute or destructive bony lesions. Reconstructed images demonstrate no additional findings. IMPRESSION: 1. Pathologic adenopathy within the left supraclavicular region, left axilla, and left para-aortic region, consistent with metastatic disease given patient's known history of urologic malignancy. 2. Borderline enlarged lymph nodes within the retrocrural region and retroperitoneum, consistent with metastatic disease. Please refer to CT abdomen report for full description. 3. Minimal right lower lobe bronchial wall thickening and mucous plugging, which may reflect reactive airway disease. No acute airspace disease. 4.  Aortic Atherosclerosis (ICD10-I70.0). Electronically Signed   By: Randa Ngo M.D.   On: 07/01/2021 17:39   CT HEMATURIA WORKUP  Result Date: 07/01/2021 CLINICAL DATA:  Gross hematuria. EXAM: CT ABDOMEN AND PELVIS WITHOUT AND WITH CONTRAST TECHNIQUE: Multidetector CT imaging of the abdomen and pelvis was performed  following the standard protocol before and following the bolus administration of intravenous contrast. CONTRAST:  126mL OMNIPAQUE IOHEXOL 350  MG/ML SOLN COMPARISON:  None. FINDINGS: Lower chest: The lung bases are clear of acute process. No pleural effusion or pulmonary lesions. The heart is normal in size. No pericardial effusion. The distal esophagus and aorta are unremarkable. Hepatobiliary: No hepatic lesions to suggest metastatic disease. No intrahepatic biliary dilatation. The gallbladder is unremarkable. No common bile duct dilatation. Pancreas: No mass, inflammation or ductal dilatation. Spleen: Normal size.  No focal lesions. Adrenals/Urinary Tract: The adrenal glands are unremarkable. Left renal artery calcifications but no definite renal calculi. There is a simple left renal cyst noted. The right kidney demonstrates marked hydronephrosis and there is also marked right hydroureter down into the pelvis. High attenuation material noted in distal third of the right ureter which could be blood or tumor. No right renal lesions are identified. Decreased perfusion because of the obstruction. There is a large irregular right-sided bladder mass likely obstructing the right ureter. The left ureter is patent. Associated extensive pelvic lymphadenopathy the. Stomach/Bowel: The stomach, duodenum, small bowel and colon are grossly normal. Vascular/Lymphatic: Age advanced vascular disease but no aneurysm or dissection. Extensive retroperitoneal and pelvic adenopathy as demonstrated on the recent study from 06/18/2021. Largest nodal mass is in the right external iliac region and measures 4.9 cm. The right pelvic sidewall adenopathy measures 2.5 cm. Partially necrotic retroperitoneal adenopathy surrounding the aorta. The largest measures 18 mm on image 39/8. Reproductive: The prostate gland and seminal vesicles are unremarkable. Other: There is diffuse lower pelvic body wall edema asymmetric on the right side and also  bobbing the upper thigh. This is most likely due to compression of the right femoral vein due to the bulk E right femoral adenopathy measuring a maximum of 3 cm on image 80/8 Musculoskeletal: No findings suspicious for osseous metastatic disease. IMPRESSION: 1. Large (7 cm) irregular right-sided bladder mass likely obstructing the right ureter with marked right-sided hydroureteronephrosis. High attenuation material in the distal right ureter could be blood or tumor. 2. Extensive retroperitoneal and pelvic adenopathy. 3. No findings suspicious for metastatic disease involving the lung bases, liver or bony structures. 4. Age advanced vascular disease. 5. Compression of the right common femoral vein by adenopathy causing right lower extremity swelling/edema. Electronically Signed   By: Marijo Sanes M.D.   On: 07/01/2021 18:01    Scheduled Meds:  Chlorhexidine Gluconate Cloth  6 each Topical Daily   levothyroxine  50 mcg Oral Q0600   Continuous Infusions:  cefTRIAXone (ROCEPHIN)  IV 2 g (07/03/21 1002)     LOS: 3 days   Time spent: 35 minutes.  Patrecia Pour, MD [facility] [email or web address] 07/03/2021, 10:21 AM

## 2021-07-03 NOTE — Progress Notes (Signed)
Patient ID: Arthur Mcmahon, male   DOB: [birth year], 55 y.o.   MRN: [id number] S: Feeling a little better today.  Denies any N/V/anorexia. O:BP 101/65 (BP Location: Left Arm)   Pulse 79   Temp 98.4 F (36.9 C) (Oral)   Resp 20   Ht 6\' 2"  (1.88 m)   Wt 84.8 kg   SpO2 100%   BMI 24.01 kg/m   Intake/Output Summary (Last 24 hours) at 07/03/2021 1141 Last data filed at 07/03/2021 0132 Gross per 24 hour  Intake 100 ml  Output 2500 ml  Net -2400 ml   Intake/Output: I/O last 3 completed shifts: In: 650 [Blood:550; IV Piggyback:100] Out: 4098 [Urine:5800]  Intake/Output this shift:  No intake/output data recorded. Weight change:  Gen:NAD CVS: RRR Resp:CTA Abd: +BS, soft, Nt/ND Ext: trace pretibial and 1 + pedal edema bilaterally, surgical incisions of RLE are dressed and clean.  Recent Labs  Lab 06/30/21 1758 07/01/21 0412 07/01/21 1149 07/01/21 1524 07/01/21 1924 07/02/21 0246 07/02/21 0609 07/02/21 1209 07/03/21 0128 07/03/21 0545  NA 115*   < > 121* 115* 117*  --  120* 121* 122* 122*  K 5.1   < > 4.1 3.9 4.0  --  3.8 4.1 3.7 3.9  CL 81*   < > 89* 85* 87*  --  89* 88* 89* 90*  CO2 21*   < > 22 23 18*  --  23 24 26 26   GLUCOSE 91   < > 90 85 75  --  99 86 106* 95  BUN 18   < > 15 15 14   --  11 11 11 10   CREATININE 1.56*   < > 1.14 1.27* 1.20  --  1.09 1.07 0.93 0.93  ALBUMIN 2.4*  --   --   --   --   --   --   --   --   --   CALCIUM 8.5*   < > 7.7* 7.4* 7.5*  --  7.7* 7.7* 7.7* 7.7*  PHOS  --   --   --   --   --  2.2*  --   --   --   --   AST 21  --   --   --   --   --   --   --   --   --   ALT 13  --   --   --   --   --   --   --   --   --    < > = values in this interval not displayed.   Liver Function Tests: Recent Labs  Lab 06/30/21 1758  AST 21  ALT 13  ALKPHOS 85  BILITOT 1.3*  PROT 7.2  ALBUMIN 2.4*   No results for input(s): LIPASE, AMYLASE in the last 168 hours. No results for input(s): AMMONIA in the last 168 hours. CBC: Recent Labs  Lab  06/30/21 1758 07/01/21 0412 07/01/21 0655 07/01/21 1149 07/02/21 0246 07/02/21 1209 07/02/21 1714 07/03/21 0128  WBC 26.1* 16.3* 15.6* 15.2* 11.3*  --   --   --   NEUTROABS 24.0*  --   --   --   --   --   --   --   HGB 9.0* 7.6* 7.3* 7.4* 7.0* 8.6* 8.4* 8.0*  HCT 26.2* 21.3* 21.7* 21.1* 19.8* 25.5* 25.0* 23.4*  MCV 94.2 93.8 95.2 93.4 93.4  --   --   --   PLT 498* 389  382 337 338  --   --   --    Cardiac Enzymes: No results for input(s): CKTOTAL, CKMB, CKMBINDEX, TROPONINI in the last 168 hours. CBG: Recent Labs  Lab 07/02/21 2154  GLUCAP 117*    Iron Studies: No results for input(s): IRON, TIBC, TRANSFERRIN, FERRITIN in the last 72 hours. Studies/Results: CT CHEST W CONTRAST  Result Date: 07/01/2021 CLINICAL DATA:  Gross hematuria, shortness of breath, urologic cancer, staging EXAM: CT CHEST WITH CONTRAST TECHNIQUE: Multidetector CT imaging of the chest was performed during intravenous contrast administration. CONTRAST:  113mL OMNIPAQUE IOHEXOL 350 MG/ML SOLN COMPARISON:  06/18/2021 FINDINGS: Cardiovascular: The heart and great vessels are unremarkable without pericardial effusion. Extensive atherosclerosis of the coronary vasculature. No evidence of thoracic aortic aneurysm or dissection. While not optimized for opacification of the pulmonary vasculature, there is sufficient contrast enhancement to exclude pulmonary emboli. No filling defects. Mediastinum/Nodes: Extensive pathologic adenopathy is seen within the left supraclavicular region. Largest lymph node mass on image 11/3 measures 1.7 x 3.5 cm. Additionally, there are numerous pathologic lymph nodes within the left axilla, largest measuring up to 1.5 cm in short axis reference image 23/3. Pathologic adenopathy is seen within the left para-aortic region, largest lymph node measuring 1.3 cm in short axis reference image 135/3. No pathologic adenopathy within the mediastinum, hila, or right axilla. The thyroid, trachea, and  esophagus are unremarkable. Lungs/Pleura: No acute airspace disease, effusion, or pneumothorax. Mild right lower lobe bronchial wall thickening and mucous plugging. Remaining central airways are patent. No pulmonary nodules or masses. Upper Abdomen: Enlarged right retrocrural lymph node measures 1.1 cm in short axis, reference image 154/3. Subcentimeter retrocaval lymph nodes are also noted. Findings are consistent with metastatic disease, please refer to CT abdomen report. Musculoskeletal: There are no acute or destructive bony lesions. Reconstructed images demonstrate no additional findings. IMPRESSION: 1. Pathologic adenopathy within the left supraclavicular region, left axilla, and left para-aortic region, consistent with metastatic disease given patient's known history of urologic malignancy. 2. Borderline enlarged lymph nodes within the retrocrural region and retroperitoneum, consistent with metastatic disease. Please refer to CT abdomen report for full description. 3. Minimal right lower lobe bronchial wall thickening and mucous plugging, which may reflect reactive airway disease. No acute airspace disease. 4.  Aortic Atherosclerosis (ICD10-I70.0). Electronically Signed   By: Randa Ngo M.D.   On: 07/01/2021 17:39   CT HEMATURIA WORKUP  Result Date: 07/01/2021 CLINICAL DATA:  Gross hematuria. EXAM: CT ABDOMEN AND PELVIS WITHOUT AND WITH CONTRAST TECHNIQUE: Multidetector CT imaging of the abdomen and pelvis was performed following the standard protocol before and following the bolus administration of intravenous contrast. CONTRAST:  13mL OMNIPAQUE IOHEXOL 350 MG/ML SOLN COMPARISON:  None. FINDINGS: Lower chest: The lung bases are clear of acute process. No pleural effusion or pulmonary lesions. The heart is normal in size. No pericardial effusion. The distal esophagus and aorta are unremarkable. Hepatobiliary: No hepatic lesions to suggest metastatic disease. No intrahepatic biliary dilatation. The  gallbladder is unremarkable. No common bile duct dilatation. Pancreas: No mass, inflammation or ductal dilatation. Spleen: Normal size.  No focal lesions. Adrenals/Urinary Tract: The adrenal glands are unremarkable. Left renal artery calcifications but no definite renal calculi. There is a simple left renal cyst noted. The right kidney demonstrates marked hydronephrosis and there is also marked right hydroureter down into the pelvis. High attenuation material noted in distal third of the right ureter which could be blood or tumor. No right renal lesions are identified. Decreased perfusion because  of the obstruction. There is a large irregular right-sided bladder mass likely obstructing the right ureter. The left ureter is patent. Associated extensive pelvic lymphadenopathy the. Stomach/Bowel: The stomach, duodenum, small bowel and colon are grossly normal. Vascular/Lymphatic: Age advanced vascular disease but no aneurysm or dissection. Extensive retroperitoneal and pelvic adenopathy as demonstrated on the recent study from 06/18/2021. Largest nodal mass is in the right external iliac region and measures 4.9 cm. The right pelvic sidewall adenopathy measures 2.5 cm. Partially necrotic retroperitoneal adenopathy surrounding the aorta. The largest measures 18 mm on image 39/8. Reproductive: The prostate gland and seminal vesicles are unremarkable. Other: There is diffuse lower pelvic body wall edema asymmetric on the right side and also bobbing the upper thigh. This is most likely due to compression of the right femoral vein due to the bulk E right femoral adenopathy measuring a maximum of 3 cm on image 80/8 Musculoskeletal: No findings suspicious for osseous metastatic disease. IMPRESSION: 1. Large (7 cm) irregular right-sided bladder mass likely obstructing the right ureter with marked right-sided hydroureteronephrosis. High attenuation material in the distal right ureter could be blood or tumor. 2. Extensive  retroperitoneal and pelvic adenopathy. 3. No findings suspicious for metastatic disease involving the lung bases, liver or bony structures. 4. Age advanced vascular disease. 5. Compression of the right common femoral vein by adenopathy causing right lower extremity swelling/edema. Electronically Signed   By: Marijo Sanes M.D.   On: 07/01/2021 18:01    Chlorhexidine Gluconate Cloth  6 each Topical Daily   levothyroxine  50 mcg Oral Q0600    BMET    Component Value Date/Time   NA 122 (L) 07/03/2021 0545   K 3.9 07/03/2021 0545   CL 90 (L) 07/03/2021 0545   CO2 26 07/03/2021 0545   GLUCOSE 95 07/03/2021 0545   BUN 10 07/03/2021 0545   CREATININE 0.93 07/03/2021 0545   CALCIUM 7.7 (L) 07/03/2021 0545   GFRNONAA >60 07/03/2021 0545   GFRAA >60 12/01/2019 2208   CBC    Component Value Date/Time   WBC 11.3 (H) 07/02/2021 0246   RBC 2.12 (L) 07/02/2021 0246   HGB 8.0 (L) 07/03/2021 0128   HCT 23.4 (L) 07/03/2021 0128   PLT 338 07/02/2021 0246   MCV 93.4 07/02/2021 0246   MCH 33.0 07/02/2021 0246   MCHC 35.4 07/02/2021 0246   RDW 13.0 07/02/2021 0246   LYMPHSABS 1.0 06/30/2021 1758   MONOABS 1.0 06/30/2021 1758   EOSABS 0.0 06/30/2021 1758   BASOSABS 0.0 06/30/2021 1758     Assessment/Plan:  Hyponatremia - Na was noted to be 115 on 06/30/21 (1.39 on 06/19/21) and improved to 121 on 07/01/21 but dropped again 4 hours later to 115, and now slowly rising to 122.  Workup revealed Sosm 243, Uosm 135, UNa <10.  Despite his lower extremity edema, the cause of hyponatremia is intravascular volume depletion or at least decreased delivery to the kidneys.  Will start IV NS and follow serum sodium levels.  He is asymptomatic.  Will also give dose of IV albumin to help improve intravascular volume.  Bladder mass with nodal metastases - for OR tomorrow for tissue diagnosis. Lower extremity edema - due to extrinsic compression from nodes.  PAD s/p RLE thrombectomy for acute limb ischemia  10/15-10/16/22.  Seen by [facility]  Anemia - presumably combination of recent surgery and ABLA as well as possible role of malignancy  Donetta Potts, MD [facility] [phone]

## 2021-07-03 NOTE — Consult Note (Signed)
[facility] Nurse wound follow up Wound type:Right groin Measurement:wound is draining moderate to large amounts of lymphatic fluid.  Dr. Stanford Breed in to see and placed an NPWT device to 187mmHg continuous negative pressure.  [facility] Nurses will change on Wed and Friday or this week and M/W/F thereafter. Wound GQQ:[id number] Drainage (amount, consistency, odor) lymphatic Periwound: intact Dressing procedure/placement/frequency: Dr. Stanford Breed placed a silicone drape (Dermatac) to skin and used a black foam. [place] will similarly protect skin for subsequent changes.  Next change will be on Wednesday, 07/06/21.  Patient is scheduled for a urologic procedure tomorrow.  [place] nursing team will follow, and will remain available to this patient, the nursing and medical teams.   Thanks, Maudie Flakes, MSN, RN, [place], Arther Abbott  Pager# [phone]

## 2021-07-03 NOTE — Consult Note (Signed)
Date of Admission:  06/30/2021          Reason for Consult: Group G streptococcal + blood culture   Referring Provider: Vance Gather, MD   Assessment:  Group G streptococcal bacteremia of unclear source Recent extensive surgery to right  leg with :Right common femoral endarterectomy including profundoplasty with bovine pericardial patch angioplastyRight lower extremity thrombectomy from femoral approach Right lower extremity 4 compartment fasciotomy on 10/192022 Left common femoral endarterectomy with profundoplasty and bovine pericardial patch angioplast  Left lower extremity arteriogram with selection of third order branche Left above-knee popliteal angioplasty with stent placement (stented with 7 mm x 40 mm self-expanding Innova postdilated on 04/11/2021 Left second toe amputation Right inguinal postoperative wound with vacuum dressing Stage 2 decubitus ulcer  Hematuria with apparent bladder cancer scheduled for TURBT on Monday Weakness Hyonatremia Acute renal failure Smoking Alcholism LE edema likely from compressed   Plan:  Continue ceftriaxone 2 g daily Repeat blood cultures I am supportive of Urology performing surgery tomorrow  Dr Linus Salmons to take over service tomorrow.  Principal Problem:   Hyponatremia Active Problems:   Peripheral arterial disease (HCC)   ARF (acute renal failure) (HCC)   Acute blood loss anemia   Hematuria   Pressure injury of skin   Scheduled Meds:  Chlorhexidine Gluconate Cloth  6 each Topical Daily   levothyroxine  50 mcg Oral Q0600   Continuous Infusions:  sodium chloride 100 mL/hr at 07/03/21 1258   cefTRIAXone (ROCEPHIN)  IV 2 g (07/03/21 1002)   PRN Meds:.acetaminophen **OR** acetaminophen, morphine injection, oxyCODONE-acetaminophen  HPI: Arthur Mcmahon is a 55 y.o. male with history of extensive smoking alcoholism who had severe peripheral vascular disease and underwent multiple vascular surgeries to both the right leg and left  leg.  He did lose his second toe due to ischemia.  His most recent surgeries were on the right side and involved an endarterectomy with profundoplasty with bovine pericardial patch angioplasty right lower extremity thrombectomy and right lower extremity compartment fasciotomy fistulotomy.  He was admitted from the ER on 27 October with gross hematuria generalized weakness lethargy.  Low-grade fever and white count was 26,000.  Blood cultures were drawn from 2 different sites on the 27th of October at [place] and on 28 October at 12:16.  He was then started on ceftriaxone initially 1 g daily then increase to 2 g daily once his bacteremia was discovered.  He has had copious drainage from his right inguinal wound though it does not sound overtly purulent this is required placement of a vacuum dressing.  It does not sound as if the operative wounds on the right leg have appeared infected.  I would assume that the group G Streptococcus came from the cutaneous source and he has certainly multiple potential sources with his recent surgeries.  I am skeptical that he has a deep endovascular infection involving his grafts or that he has osteomyelitis at present.  He does have bilateral pain in his feet and numbness and has extensive edema but much of this is likely due to his recent surgical interventions in addition to compression from enlarged lymph nodes including that of the right common femoral vein in the context of his bladder cancer.  I spent 82 minutes with the patient including than 50% of the time in face to face counseling of the patient regarding the nature of group G streptococcal bacteremia how blood cultures were taken and interpreted personally reviewing CT chest abdomen pelvis  that have shown large right-sided bladder mass and extensive retroperitoneal pelvic and also supraclavicular lymphadenopathy compression of right common femoral vein reviewing CBC CMP updated blood cultures along with with  review of medical records in preparation for the visit and during the visit and in coordination of his care.      Review of Systems: Review of Systems  Constitutional:  Positive for fever and malaise/fatigue. Negative for chills and weight loss.  HENT:  Negative for congestion and sore throat.   Eyes:  Negative for blurred vision and photophobia.  Respiratory:  Negative for cough, shortness of breath and wheezing.   Cardiovascular:  Positive for palpitations. Negative for chest pain and leg swelling.  Gastrointestinal:  Negative for abdominal pain, blood in stool, constipation, diarrhea, heartburn, melena, nausea and vomiting.  Genitourinary:  Positive for hematuria. Negative for dysuria and flank pain.  Musculoskeletal:  Negative for back pain, falls, joint pain and myalgias.  Skin:  Negative for itching and rash.  Neurological:  Positive for sensory change and weakness. Negative for dizziness, focal weakness, loss of consciousness and headaches.  Endo/Heme/Allergies:  Does not bruise/bleed easily.  Psychiatric/Behavioral:  Positive for depression. Negative for suicidal ideas. The patient does not have insomnia.    Past Medical History:  Diagnosis Date   Anemia    ETOH abuse    Hypertension    Peripheral vascular disease ([place])    Pneumonia     Social History   Tobacco Use   Smoking status: Every Day    Packs/day: 0.50    Years: 25.00    Pack years: 12.50    Types: Cigarettes   Smokeless tobacco: Never  Vaping Use   Vaping Use: Never used  Substance Use Topics   Alcohol use: Not Currently    Comment: daily   Drug use: Yes    Types: Marijuana    Comment: 2-3 a week    Family History  Problem Relation Age of Onset   Cancer Mother    Heart failure Mother    Cancer Father    No Known Allergies  OBJECTIVE: Blood pressure 115/70, pulse 97, temperature 97.9 F (36.6 C), temperature source Oral, resp. rate 18, height 6\' 2"  (1.88 m), weight 84.8 kg, SpO2 98  %.  Physical Exam Constitutional:      Appearance: He is well-developed.  HENT:     Head: Normocephalic and atraumatic.  Eyes:     Conjunctiva/sclera: Conjunctivae normal.  Cardiovascular:     Rate and Rhythm: Normal rate and regular rhythm.     Heart sounds: No murmur heard.   No friction rub. No gallop.  Pulmonary:     Effort: Pulmonary effort is normal. No respiratory distress.     Breath sounds: Normal breath sounds. No stridor. No wheezing or rhonchi.  Abdominal:     General: There is no distension.     Palpations: Abdomen is soft.  Musculoskeletal:        General: No tenderness. Normal range of motion.     Cervical back: Normal range of motion and neck supple.     Right lower leg: Edema present.     Left lower leg: Edema present.  Skin:    General: Skin is warm and dry.     Coloration: Skin is not pale.     Findings: No erythema or rash.  Neurological:     General: No focal deficit present.     Mental Status: He is alert and oriented to person, place, and time.  Psychiatric:        Attention and Perception: Attention normal.        Mood and Affect: Mood normal.        Behavior: Behavior normal.        Thought Content: Thought content normal.        Cognition and Memory: Cognition normal.        Judgment: Judgment normal.   Lower extremities:  07/03/2021:  Right foot    Left foot :     Lab Results Lab Results  Component Value Date   WBC 11.3 (H) 07/02/2021   HGB 8.0 (L) 07/03/2021   HCT 23.4 (L) 07/03/2021   MCV 93.4 07/02/2021   PLT 338 07/02/2021    Lab Results  Component Value Date   CREATININE 0.93 07/03/2021   BUN 10 07/03/2021   NA 123 (L) 07/03/2021   K 3.9 07/03/2021   CL 90 (L) 07/03/2021   CO2 26 07/03/2021    Lab Results  Component Value Date   ALT 13 06/30/2021   AST 21 06/30/2021   ALKPHOS 85 06/30/2021   BILITOT 1.3 (H) 06/30/2021     Microbiology: Recent Results (from the past 240 hour(s))  Blood culture (routine x 2)      Status: Abnormal   Collection Time: 06/30/21  5:58 PM   Specimen: Site Not Specified; Blood  Result Value Ref Range Status   Specimen Description SITE NOT SPECIFIED  Final   Special Requests   Final    BOTTLES DRAWN AEROBIC AND ANAEROBIC Blood Culture results may not be optimal due to an excessive volume of blood received in culture bottles   Culture  Setup Time   Final    GRAM POSITIVE COCCI IN CHAINS AEROBIC BOTTLE ONLY CRITICAL RESULT CALLED TO, READ BACK BY AND VERIFIED WITH: PHARMD E BREWING [id number] AT 836 AM BY CM Performed at [facility], [place] [address] [place], [place] [address]    Culture STREPTOCOCCUS GROUP G (A)  Final   Report Status 07/03/2021 FINAL  Final   Organism ID, Bacteria STREPTOCOCCUS GROUP G  Final      Susceptibility   Streptococcus group g - MIC*    CLINDAMYCIN <=0.25 SENSITIVE Sensitive     AMPICILLIN <=0.25 SENSITIVE Sensitive     ERYTHROMYCIN <=0.12 SENSITIVE Sensitive     VANCOMYCIN 0.5 SENSITIVE Sensitive     CEFTRIAXONE <=0.12 SENSITIVE Sensitive     LEVOFLOXACIN <=0.25 SENSITIVE Sensitive     * STREPTOCOCCUS GROUP G  Blood Culture ID Panel (Reflexed)     Status: Abnormal   Collection Time: 06/30/21  5:58 PM  Result Value Ref Range Status   Enterococcus faecalis NOT DETECTED NOT DETECTED Final   Enterococcus Faecium NOT DETECTED NOT DETECTED Final   Listeria monocytogenes NOT DETECTED NOT DETECTED Final   Staphylococcus species NOT DETECTED NOT DETECTED Final   Staphylococcus aureus (BCID) NOT DETECTED NOT DETECTED Final   Staphylococcus epidermidis NOT DETECTED NOT DETECTED Final   Staphylococcus lugdunensis NOT DETECTED NOT DETECTED Final   Streptococcus species DETECTED (A) NOT DETECTED Final    Comment: Not Enterococcus species, Streptococcus agalactiae, Streptococcus pyogenes, or Streptococcus pneumoniae. CRITICAL RESULT CALLED TO, READ BACK BY AND VERIFIED WITH: PHARMD E BREWING [id number] AT 32 AM BY CM    Streptococcus agalactiae  NOT DETECTED NOT DETECTED Final   Streptococcus pneumoniae NOT DETECTED NOT DETECTED Final   Streptococcus pyogenes NOT DETECTED NOT DETECTED Final   A.calcoaceticus-baumannii NOT DETECTED NOT DETECTED Final  Bacteroides fragilis NOT DETECTED NOT DETECTED Final   Enterobacterales NOT DETECTED NOT DETECTED Final   Enterobacter cloacae complex NOT DETECTED NOT DETECTED Final   Escherichia coli NOT DETECTED NOT DETECTED Final   Klebsiella aerogenes NOT DETECTED NOT DETECTED Final   Klebsiella oxytoca NOT DETECTED NOT DETECTED Final   Klebsiella pneumoniae NOT DETECTED NOT DETECTED Final   Proteus species NOT DETECTED NOT DETECTED Final   Salmonella species NOT DETECTED NOT DETECTED Final   Serratia marcescens NOT DETECTED NOT DETECTED Final   Haemophilus influenzae NOT DETECTED NOT DETECTED Final   Neisseria meningitidis NOT DETECTED NOT DETECTED Final   Pseudomonas aeruginosa NOT DETECTED NOT DETECTED Final   Stenotrophomonas maltophilia NOT DETECTED NOT DETECTED Final   Candida albicans NOT DETECTED NOT DETECTED Final   Candida auris NOT DETECTED NOT DETECTED Final   Candida glabrata NOT DETECTED NOT DETECTED Final   Candida krusei NOT DETECTED NOT DETECTED Final   Candida parapsilosis NOT DETECTED NOT DETECTED Final   Candida tropicalis NOT DETECTED NOT DETECTED Final   Cryptococcus neoformans/gattii NOT DETECTED NOT DETECTED Final    Comment: Performed at [facility], [place] [address], [place], [place] [address]  Blood culture (routine x 2)     Status: None (Preliminary result)   Collection Time: 07/01/21 12:16 AM   Specimen: BLOOD RIGHT FOREARM  Result Value Ref Range Status   Specimen Description BLOOD RIGHT FOREARM  Final   Special Requests   Final    BOTTLES DRAWN AEROBIC AND ANAEROBIC Blood Culture adequate volume   Culture   Final    NO GROWTH 2 DAYS Performed at [facility], [place] [address], [place],  [address]    Report Status PENDING  Incomplete   Resp Panel by RT-PCR (Flu A&B, Covid) Nasopharyngeal Swab     Status: None   Collection Time: 07/01/21 12:21 AM   Specimen: Nasopharyngeal Swab; Nasopharyngeal(NP) swabs in vial transport medium  Result Value Ref Range Status   SARS Coronavirus 2 by RT PCR NEGATIVE NEGATIVE Final    Comment: (NOTE) SARS-CoV-2 target nucleic acids are NOT DETECTED.  The SARS-CoV-2 RNA is generally detectable in upper respiratory specimens during the acute phase of infection. The lowest concentration of SARS-CoV-2 viral copies this assay can detect is 138 copies/mL. A negative result does not preclude SARS-Cov-2 infection and should not be used as the sole basis for treatment or other patient management decisions. A negative result may occur with  improper specimen collection/handling, submission of specimen other than nasopharyngeal swab, presence of viral mutation(s) within the areas targeted by this assay, and inadequate number of viral copies(<138 copies/mL). A negative result must be combined with clinical observations, patient history, and epidemiological information. The expected result is Negative.  Fact Sheet for Patients:  EntrepreneurPulse.com.au  Fact Sheet for Healthcare Providers:  IncredibleEmployment.be  This test is no t yet approved or cleared by the [place] FDA and  has been authorized for detection and/or diagnosis of SARS-CoV-2 by FDA under an Emergency Use Authorization (EUA). This EUA will remain  in effect (meaning this test can be used) for the duration of the COVID-19 declaration under Section 564(b)(1) of the Act, 21 U.S.C.section 360bbb-3(b)(1), unless the authorization is terminated  or revoked sooner.       Influenza A by PCR NEGATIVE NEGATIVE Final   Influenza B by PCR NEGATIVE NEGATIVE Final    Comment: (NOTE) The Xpert Xpress SARS-CoV-2/FLU/RSV plus assay is intended as an aid in the diagnosis  of influenza from  Nasopharyngeal swab specimens and should not be used as a sole basis for treatment. Nasal washings and aspirates are unacceptable for Xpert Xpress SARS-CoV-2/FLU/RSV testing.  Fact Sheet for Patients: EntrepreneurPulse.com.au  Fact Sheet for Healthcare Providers: IncredibleEmployment.be  This test is not yet approved or cleared by the [place] FDA and has been authorized for detection and/or diagnosis of SARS-CoV-2 by FDA under an Emergency Use Authorization (EUA). This EUA will remain in effect (meaning this test can be used) for the duration of the COVID-19 declaration under Section 564(b)(1) of the Act, 21 U.S.C. section 360bbb-3(b)(1), unless the authorization is terminated or revoked.  Performed at [facility], [place] [address], [place], [place] [address]     Alcide Evener, [facility] [phone] pager  07/03/2021, 2:51 PM

## 2021-07-03 NOTE — Plan of Care (Signed)
  Problem: Education: Goal: Knowledge of General Education information will improve Description: Including pain rating scale, medication(s)/side effects and non-pharmacologic comfort measures 07/03/2021 0822 by Albina Billet, RN Outcome: Progressing 07/02/2021 1849 by Albina Billet, RN Outcome: Progressing   Problem: Clinical Measurements: Goal: Ability to maintain clinical measurements within normal limits will improve 07/03/2021 0822 by Albina Billet, RN Outcome: Progressing 07/02/2021 1849 by Albina Billet, RN Outcome: Progressing   Problem: Clinical Measurements: Goal: Will remain free from infection 07/03/2021 0822 by Albina Billet, RN Outcome: Progressing 07/02/2021 1849 by Albina Billet, RN Outcome: Progressing

## 2021-07-03 NOTE — Progress Notes (Signed)
[facility] [place] PROGRESS NOTE  ASSESSMENT / PLAN: Arthur Mcmahon is a 55 y.o. male with lymphocutaneous leak after RLE thrombectomy for acute limb ischemia 10/15-10/16/22. He has a well perfused right foot. His edema is likely from venous compression by bulky pelvic nodal disease - likely metastatic bladder cancer. Will continue conservative therapy with negative pressure wound therapy to right groin.   SUBJECTIVE: Prevena worked well until it flooded today. No new complaints. For TURBT tomorrow.  OBJECTIVE: BP 101/65 (BP Location: Left Arm)   Pulse 79   Temp 98.4 F (36.9 C) (Oral)   Resp 20   Ht 6\' 2"  (1.88 m)   Wt 84.8 kg   SpO2 100%   BMI 24.01 kg/m   Intake/Output Summary (Last 24 hours) at 07/03/2021 1028 Last data filed at 07/03/2021 0132 Gross per 24 hour  Intake 100 ml  Output 2500 ml  Net -2400 ml    Constitutional: chronically ill appearing. no acute distress. Cardiac: RRR. Pulmonary: unlabored Abdomen: soft, not tender Vascular: R groin clean and dry. Changed dressing today. R foot warm and well perfused.   CBC Latest Ref Rng & Units 07/03/2021 07/02/2021 07/02/2021  WBC 4.0 - 10.5 K/uL - - -  Hemoglobin 13.0 - 17.0 g/dL 8.0(L) 8.4(L) 8.6(L)  Hematocrit 39.0 - 52.0 % 23.4(L) 25.0(L) 25.5(L)  Platelets 150 - 400 K/uL - - -     CMP Latest Ref Rng & Units 07/03/2021 07/03/2021 07/02/2021  Glucose 70 - 99 mg/dL 95 106(H) 86  BUN 6 - 20 mg/dL 10 11 11   Creatinine 0.61 - 1.24 mg/dL 0.93 0.93 1.07  Sodium 135 - 145 mmol/L 122(L) 122(L) 121(L)  Potassium 3.5 - 5.1 mmol/L 3.9 3.7 4.1  Chloride 98 - 111 mmol/L 90(L) 89(L) 88(L)  CO2 22 - 32 mmol/L 26 26 24   Calcium 8.9 - 10.3 mg/dL 7.7(L) 7.7(L) 7.7(L)  Total Protein 6.5 - 8.1 g/dL - - -  Total Bilirubin 0.3 - 1.2 mg/dL - - -  Alkaline Phos 38 - 126 U/L - - -  AST 15 - 41 U/L - - -  ALT 0 - 44 U/L - - -    Estimated Creatinine Clearance: 105.6 mL/min (by C-G formula based on SCr  of 0.93 mg/dL).   [name]. Stanford Breed, MD [facility] Phone Number: [phone] 07/03/2021 10:28 AM

## 2021-07-03 NOTE — Progress Notes (Signed)
The patient had no I.V. at the time of the echo. 2 different nurses made attempts to obtain an I.V. before and during the echo testing.  Alvino Chapel, RCS

## 2021-07-04 ENCOUNTER — Inpatient Hospital Stay (HOSPITAL_COMMUNITY): Payer: Medicaid Other | Admitting: Anesthesiology

## 2021-07-04 ENCOUNTER — Encounter (HOSPITAL_COMMUNITY)
Admission: EM | Disposition: A | Discharge status: Home Health | Payer: Self-pay | Source: Home / Self Care | Attending: Family Medicine

## 2021-07-04 ENCOUNTER — Encounter (HOSPITAL_COMMUNITY): Payer: Self-pay | Admitting: Internal Medicine

## 2021-07-04 ENCOUNTER — Inpatient Hospital Stay (HOSPITAL_COMMUNITY): Payer: Medicaid Other

## 2021-07-04 HISTORY — PX: TRANSURETHRAL RESECTION OF BLADDER TUMOR: SHX2575

## 2021-07-04 LAB — CBC WITH DIFFERENTIAL/PLATELET
Abs Immature Granulocytes: 0.15 10*3/uL — ABNORMAL HIGH (ref 0.00–0.07)
Basophils Absolute: 0 10*3/uL (ref 0.0–0.1)
Basophils Relative: 0 %
Eosinophils Absolute: 0.2 10*3/uL (ref 0.0–0.5)
Eosinophils Relative: 1 %
HCT: 23.7 % — ABNORMAL LOW (ref 39.0–52.0)
Hemoglobin: 8.1 g/dL — ABNORMAL LOW (ref 13.0–17.0)
Immature Granulocytes: 1 %
Lymphocytes Relative: 7 %
Lymphs Abs: 0.8 10*3/uL (ref 0.7–4.0)
MCH: 31.9 pg (ref 26.0–34.0)
MCHC: 34.2 g/dL (ref 30.0–36.0)
MCV: 93.3 fL (ref 80.0–100.0)
Monocytes Absolute: 0.8 10*3/uL (ref 0.1–1.0)
Monocytes Relative: 7 %
Neutro Abs: 9.4 10*3/uL — ABNORMAL HIGH (ref 1.7–7.7)
Neutrophils Relative %: 84 %
Platelets: 346 10*3/uL (ref 150–400)
RBC: 2.54 MIL/uL — ABNORMAL LOW (ref 4.22–5.81)
RDW: 14.5 % (ref 11.5–15.5)
WBC: 11.3 10*3/uL — ABNORMAL HIGH (ref 4.0–10.5)
nRBC: 0 % (ref 0.0–0.2)

## 2021-07-04 LAB — HEMOGLOBIN AND HEMATOCRIT, BLOOD
HCT: 27.2 % — ABNORMAL LOW (ref 39.0–52.0)
Hemoglobin: 9.1 g/dL — ABNORMAL LOW (ref 13.0–17.0)

## 2021-07-04 LAB — COMPREHENSIVE METABOLIC PANEL
ALT: 32 U/L (ref 0–44)
AST: 49 U/L — ABNORMAL HIGH (ref 15–41)
Albumin: 2.3 g/dL — ABNORMAL LOW (ref 3.5–5.0)
Alkaline Phosphatase: 74 U/L (ref 38–126)
Anion gap: 5 (ref 5–15)
BUN: 8 mg/dL (ref 6–20)
CO2: 26 mmol/L (ref 22–32)
Calcium: 7.8 mg/dL — ABNORMAL LOW (ref 8.9–10.3)
Chloride: 95 mmol/L — ABNORMAL LOW (ref 98–111)
Creatinine, Ser: 0.86 mg/dL (ref 0.61–1.24)
GFR, Estimated: >60 mL/min (ref >60)
Glucose, Bld: 92 mg/dL (ref 70–99)
Potassium: 3.9 mmol/L (ref 3.5–5.1)
Sodium: 126 mmol/L — ABNORMAL LOW (ref 135–145)
Total Bilirubin: 0.7 mg/dL (ref 0.3–1.2)
Total Protein: 5.7 g/dL — ABNORMAL LOW (ref 6.5–8.1)

## 2021-07-04 LAB — BASIC METABOLIC PANEL
Anion gap: 6 (ref 5–15)
BUN: 10 mg/dL (ref 6–20)
CO2: 25 mmol/L (ref 22–32)
Calcium: 7.6 mg/dL — ABNORMAL LOW (ref 8.9–10.3)
Chloride: 95 mmol/L — ABNORMAL LOW (ref 98–111)
Creatinine, Ser: 0.91 mg/dL (ref 0.61–1.24)
GFR, Estimated: >60 mL/min (ref >60)
Glucose, Bld: 169 mg/dL — ABNORMAL HIGH (ref 70–99)
Potassium: 4.5 mmol/L (ref 3.5–5.1)
Sodium: 126 mmol/L — ABNORMAL LOW (ref 135–145)

## 2021-07-04 LAB — SODIUM, URINE, RANDOM: Sodium, Ur: <10 mmol/L

## 2021-07-04 LAB — SURGICAL PCR SCREEN
MRSA, PCR: NEGATIVE
Staphylococcus aureus: NEGATIVE

## 2021-07-04 LAB — SODIUM
Sodium: 122 mmol/L — ABNORMAL LOW (ref 135–145)
Sodium: 125 mmol/L — ABNORMAL LOW (ref 135–145)
Sodium: 126 mmol/L — ABNORMAL LOW (ref 135–145)

## 2021-07-04 LAB — OSMOLALITY, URINE: Osmolality, Ur: 246 mOsm/kg — ABNORMAL LOW (ref 300–900)

## 2021-07-04 SURGERY — TURBT (TRANSURETHRAL RESECTION OF BLADDER TUMOR)
Anesthesia: General

## 2021-07-04 MED ORDER — ALBUTEROL SULFATE HFA 108 (90 BASE) MCG/ACT IN AERS
INHALATION_SPRAY | RESPIRATORY_TRACT | Status: AC
  Filled 2021-07-04: qty 6.7

## 2021-07-04 MED ORDER — ALBUTEROL SULFATE HFA 108 (90 BASE) MCG/ACT IN AERS
INHALATION_SPRAY | RESPIRATORY_TRACT | Status: DC | PRN
  Administered 2021-07-04: 2 via RESPIRATORY_TRACT

## 2021-07-04 MED ORDER — FENTANYL CITRATE (PF) 100 MCG/2ML IJ SOLN
INTRAMUSCULAR | Status: AC
  Filled 2021-07-04: qty 2

## 2021-07-04 MED ORDER — SODIUM CHLORIDE 0.9 % IR SOLN
3000.0000 mL | Status: DC
  Administered 2021-07-04 – 2021-07-05 (×2): 3000 mL

## 2021-07-04 MED ORDER — OXYCODONE HCL 5 MG/5ML PO SOLN: 5.0000 mg | Freq: Once | ORAL | Status: DC | PRN

## 2021-07-04 MED ORDER — ROCURONIUM BROMIDE 10 MG/ML (PF) SYRINGE
PREFILLED_SYRINGE | INTRAVENOUS | Status: DC | PRN
  Administered 2021-07-04: 60 mg via INTRAVENOUS

## 2021-07-04 MED ORDER — DEXAMETHASONE SODIUM PHOSPHATE 10 MG/ML IJ SOLN
INTRAMUSCULAR | Status: DC | PRN
  Administered 2021-07-04: 5 mg via INTRAVENOUS

## 2021-07-04 MED ORDER — PROPOFOL 10 MG/ML IV BOLUS
INTRAVENOUS | Status: AC
  Filled 2021-07-04: qty 20

## 2021-07-04 MED ORDER — FENTANYL CITRATE (PF) 250 MCG/5ML IJ SOLN
INTRAMUSCULAR | Status: DC | PRN
  Administered 2021-07-04 (×4): 50 ug via INTRAVENOUS

## 2021-07-04 MED ORDER — ALBUTEROL SULFATE HFA 108 (90 BASE) MCG/ACT IN AERS: INHALATION_SPRAY | RESPIRATORY_TRACT | Status: DC | PRN

## 2021-07-04 MED ORDER — CHLORHEXIDINE GLUCONATE 0.12 % MT SOLN
15.0000 mL | Freq: Once | OROMUCOSAL | Status: AC
  Administered 2021-07-04: 15 mL via OROMUCOSAL

## 2021-07-04 MED ORDER — PROPOFOL 10 MG/ML IV BOLUS
INTRAVENOUS | Status: DC | PRN
  Administered 2021-07-04: 150 mg via INTRAVENOUS

## 2021-07-04 MED ORDER — IOHEXOL 300 MG/ML  SOLN
INTRAMUSCULAR | Status: DC | PRN
  Administered 2021-07-04: 10 mL

## 2021-07-04 MED ORDER — STERILE WATER FOR IRRIGATION IR SOLN
Status: DC | PRN
  Administered 2021-07-04: 500 mL

## 2021-07-04 MED ORDER — LACTATED RINGERS IV SOLN: INTRAVENOUS | Status: DC

## 2021-07-04 MED ORDER — LIDOCAINE 2% (20 MG/ML) 5 ML SYRINGE
INTRAMUSCULAR | Status: DC | PRN
  Administered 2021-07-04: 60 mg via INTRAVENOUS

## 2021-07-04 MED ORDER — SODIUM CHLORIDE 0.9 % IV SOLN: INTRAVENOUS | Status: DC

## 2021-07-04 MED ORDER — MIDAZOLAM HCL 2 MG/2ML IJ SOLN
INTRAMUSCULAR | Status: AC
  Filled 2021-07-04: qty 2

## 2021-07-04 MED ORDER — HYDROMORPHONE HCL 1 MG/ML IJ SOLN: 0.2500 mg | INTRAMUSCULAR | Status: DC | PRN

## 2021-07-04 MED ORDER — CHLORHEXIDINE GLUCONATE 0.12 % MT SOLN: 15.0000 mL | Freq: Once | OROMUCOSAL | Status: DC

## 2021-07-04 MED ORDER — ONDANSETRON HCL 4 MG/2ML IJ SOLN
INTRAMUSCULAR | Status: AC
  Filled 2021-07-04: qty 2

## 2021-07-04 MED ORDER — OXYCODONE HCL 5 MG PO TABS: 5.0000 mg | ORAL_TABLET | Freq: Once | ORAL | Status: DC | PRN

## 2021-07-04 MED ORDER — MUPIROCIN 2 % EX OINT
1.0000 "application " | TOPICAL_OINTMENT | Freq: Two times a day (BID) | CUTANEOUS | Status: DC
  Administered 2021-07-04 – 2021-07-08 (×9): 1 via NASAL
  Filled 2021-07-04: qty 22

## 2021-07-04 MED ORDER — 0.9 % SODIUM CHLORIDE (POUR BTL) OPTIME
TOPICAL | Status: DC | PRN
  Administered 2021-07-04: 1000 mL

## 2021-07-04 MED ORDER — DEXAMETHASONE SODIUM PHOSPHATE 10 MG/ML IJ SOLN
INTRAMUSCULAR | Status: AC
  Filled 2021-07-04: qty 1

## 2021-07-04 MED ORDER — SODIUM CHLORIDE 0.9 % IR SOLN
Status: DC | PRN
  Administered 2021-07-04: 12000 mL

## 2021-07-04 MED ORDER — PHENYLEPHRINE HCL (PRESSORS) 10 MG/ML IV SOLN
INTRAVENOUS | Status: DC | PRN
  Administered 2021-07-04 (×2): 80 ug via INTRAVENOUS

## 2021-07-04 MED ORDER — LIDOCAINE HCL (PF) 2 % IJ SOLN
INTRAMUSCULAR | Status: AC
  Filled 2021-07-04: qty 5

## 2021-07-04 MED ORDER — ONDANSETRON HCL 4 MG/2ML IJ SOLN: 4.0000 mg | Freq: Once | INTRAMUSCULAR | Status: DC | PRN

## 2021-07-04 MED ORDER — PHENYLEPHRINE 40 MCG/ML (10ML) SYRINGE FOR IV PUSH (FOR BLOOD PRESSURE SUPPORT)
PREFILLED_SYRINGE | INTRAVENOUS | Status: AC
  Filled 2021-07-04: qty 10

## 2021-07-04 MED ORDER — MIDAZOLAM HCL 5 MG/5ML IJ SOLN
INTRAMUSCULAR | Status: DC | PRN
  Administered 2021-07-04: 2 mg via INTRAVENOUS

## 2021-07-04 MED ORDER — ONDANSETRON HCL 4 MG/2ML IJ SOLN
INTRAMUSCULAR | Status: DC | PRN
  Administered 2021-07-04: 4 mg via INTRAVENOUS

## 2021-07-04 MED ORDER — SUGAMMADEX SODIUM 200 MG/2ML IV SOLN
INTRAVENOUS | Status: DC | PRN
  Administered 2021-07-04: 180 mg via INTRAVENOUS

## 2021-07-04 SURGICAL SUPPLY — 20 items
BAG URINE DRAIN 2000ML AR STRL (UROLOGICAL SUPPLIES) IMPLANT
BAG URO CATCHER STRL LF (MISCELLANEOUS) ×2 IMPLANT
CATH HEMA 3WAY 30CC 22FR COUDE (CATHETERS) ×1 IMPLANT
CATH URET 5FR 28IN OPEN ENDED (CATHETERS) ×1 IMPLANT
DRAPE FOOT SWITCH (DRAPES) ×2 IMPLANT
ELECT REM PT RETURN 15FT ADLT (MISCELLANEOUS) ×2 IMPLANT
EVACUATOR MICROVAS BLADDER (UROLOGICAL SUPPLIES) IMPLANT
GLOVE SURG ENC TEXT LTX SZ7.5 (GLOVE) ×2 IMPLANT
GOWN STRL REUS W/TWL LRG LVL3 (GOWN DISPOSABLE) ×2 IMPLANT
GUIDEWIRE STR DUAL SENSOR (WIRE) ×1 IMPLANT
KIT TURNOVER KIT A (KITS) IMPLANT
LOOP CUT BIPOLAR 24F LRG (ELECTROSURGICAL) ×1 IMPLANT
MANIFOLD NEPTUNE II (INSTRUMENTS) ×2 IMPLANT
PACK CYSTO (CUSTOM PROCEDURE TRAY) ×2 IMPLANT
PENCIL SMOKE EVACUATOR (MISCELLANEOUS) IMPLANT
SYR 30ML LL (SYRINGE) ×1 IMPLANT
SYR TOOMEY IRRIG 70ML (MISCELLANEOUS) ×2
SYRINGE TOOMEY IRRIG 70ML (MISCELLANEOUS) IMPLANT
TUBING CONNECTING 10 (TUBING) ×2 IMPLANT
TUBING UROLOGY SET (TUBING) ×2 IMPLANT

## 2021-07-04 NOTE — Progress Notes (Signed)
Patient ID: Arthur Mcmahon, male   DOB: 1965-09-11, 55 y.o.   MRN: [id number] S: pt is off the floor in a procedure  O:BP (!) 102/58   Pulse (!) 59   Temp (!) 97.5 F (36.4 C) (Oral)   Resp 16   Ht 6\' 2"  (1.88 m)   Wt 84.8 kg   SpO2 98%   BMI 24.00 kg/m   Intake/Output Summary (Last 24 hours) at 07/04/2021 1442 Last data filed at 07/04/2021 0100 Gross per 24 hour  Intake 1479.48 ml  Output 1950 ml  Net -470.52 ml    Intake/Output: I/O last 3 completed shifts: In: 1479.5 [P.O.:480; I.V.:909.5; Other:90] Out: 3800 [Urine:3800]  Intake/Output this shift:  No intake/output data recorded. Weight change:   Exam: Pt is not in room   Assessment/Plan: Hyponatremia - Na was noted to be 115 on 06/30/21 (1.39 on 06/19/21) and improved to 121 on 07/01/21 but dropped again 4 hours later to 115, and now slowly rising to 122.  Workup revealed Sosm 243, Uosm 135, UNa <10.  Despite his lower extremity edema, the cause of hyponatremia is intravascular volume depletion. We started IV NS and today is Na + is up to 125-126 range. We repeated urine Na/ Osm and they still suggest volume depletion. Will continue NS 0.9% at 100 cc/hr. Get bmet tonight x 1 and in am tomorrow, have dc'd q 4hr labs. Pt asymptomatic.   Will follow.  Bladder mass with nodal metastases - in OR today for tissue diagnosis. Lower extremity edema - due to extrinsic compression from nodes.  PAD s/p RLE thrombectomy for acute limb ischemia 10/15-10/16/22.  Seen by [facility]  Anemia - presumably combination of recent surgery and ABLA as well as possible role of malignancy  Rob Jonnie Finner, MD 07/04/2021, 2:45 PM      Recent Labs  Lab 06/30/21 1758 07/01/21 7628 07/01/21 1524 07/01/21 1924 07/02/21 0246 07/02/21 3151 07/02/21 1209 07/03/21 0128 07/03/21 7616 07/03/21 1238 07/03/21 1622 07/03/21 2009 07/04/21 0011 07/04/21 0453 07/04/21 0744 07/04/21 1151  NA 115*   < > 115* 117*  --  120* 121* 122* 122* 123* 123* 125*  122* 126* 125* 126*  K 5.1   < > 3.9 4.0  --  3.8 4.1 3.7 3.9  --   --   --   --  3.9  --   --   CL 81*   < > 85* 87*  --  89* 88* 89* 90*  --   --   --   --  95*  --   --   CO2 21*   < > 23 18*  --  23 24 26 26   --   --   --   --  26  --   --   GLUCOSE 91   < > 85 75  --  99 86 106* 95  --   --   --   --  92  --   --   BUN 18   < > 15 14  --  11 11 11 10   --   --   --   --  8  --   --   CREATININE 1.56*   < > 1.27* 1.20  --  1.09 1.07 0.93 0.93  --   --   --   --  0.86  --   --   ALBUMIN 2.4*  --   --   --   --   --   --   --   --   --   --   --   --  2.3*  --   --   CALCIUM 8.5*   < > 7.4* 7.5*  --  7.7* 7.7* 7.7* 7.7*  --   --   --   --  7.8*  --   --   PHOS  --   --   --   --  2.2*  --   --   --   --   --   --   --   --   --   --   --   AST 21  --   --   --   --   --   --   --   --   --   --   --   --  49*  --   --   ALT 13  --   --   --   --   --   --   --   --   --   --   --   --  32  --   --    < > = values in this interval not displayed.    Liver Function Tests: Recent Labs  Lab 06/30/21 1758 07/04/21 0453  AST 21 49*  ALT 13 32  ALKPHOS 85 74  BILITOT 1.3* 0.7  PROT 7.2 5.7*  ALBUMIN 2.4* 2.3*    No results for input(s): LIPASE, AMYLASE in the last 168 hours. No results for input(s): AMMONIA in the last 168 hours. CBC: Recent Labs  Lab 06/30/21 1758 07/01/21 0412 07/01/21 0655 07/01/21 1149 07/02/21 0246 07/02/21 1209 07/03/21 0128 07/03/21 1622 07/04/21 0453  WBC 26.1* 16.3* 15.6* 15.2* 11.3*  --   --   --  11.3*  NEUTROABS 24.0*  --   --   --   --   --   --   --  9.4*  HGB 9.0* 7.6* 7.3* 7.4* 7.0*   < > 8.0* 7.9* 8.1*  HCT 26.2* 21.3* 21.7* 21.1* 19.8*   < > 23.4* 23.2* 23.7*  MCV 94.2 93.8 95.2 93.4 93.4  --   --   --  93.3  PLT 498* 389 382 337 338  --   --   --  346   < > = values in this interval not displayed.

## 2021-07-04 NOTE — Plan of Care (Signed)
  Problem: Education: Goal: Knowledge of General Education information will improve Description Including pain rating scale, medication(s)/side effects and non-pharmacologic comfort measures Outcome: Progressing   Problem: Clinical Measurements: Goal: Ability to maintain clinical measurements within normal limits will improve Outcome: Progressing   Problem: Activity: Goal: Risk for activity intolerance will decrease Outcome: Progressing   

## 2021-07-04 NOTE — H&P (View-Only) (Signed)
Day of Surgery Subjective: Denies pain. No nausea or emesis. Tolerating foley. Denies abdominal pain or flank pain.  Objective: Vital signs in last 24 hours: Temp:  [97.9 F (36.6 C)-98.5 F (36.9 C)] 98.5 F (36.9 C) (10/31 0615) Pulse Rate:  [73-97] 73 (10/31 0615) Resp:  [18-49] 49 (10/31 0801) BP: (107-115)/(67-70) 107/69 (10/31 0615) SpO2:  [98 %-100 %] 100 % (10/31 0615)  Intake/Output from previous day: 10/30 0701 - 10/31 0700 In: 1479.5 [P.O.:480; I.V.:909.5] Out: 2500 [Urine:2500] Intake/Output this shift: No intake/output data recorded.  Physical Exam:  Supraclavicular left sided node, non-tender General: Alert and oriented CV: RRR Lungs: Clear Abdomen: Soft, ND, NT Ext: NT, No erythema  Lab Results: Recent Labs    07/03/21 0128 07/03/21 1622 07/04/21 0453  HGB 8.0* 7.9* 8.1*  HCT 23.4* 23.2* 23.7*   BMET Recent Labs    07/03/21 0545 07/03/21 1238 07/04/21 0453 07/04/21 0744  NA 122*   < > 126* 125*  K 3.9  --  3.9  --   CL 90*  --  95*  --   CO2 26  --  26  --   GLUCOSE 95  --  92  --   BUN 10  --  8  --   CREATININE 0.93  --  0.86  --   CALCIUM 7.7*  --  7.8*  --    < > = values in this interval not displayed.     Studies/Results: ECHOCARDIOGRAM COMPLETE  Result Date: 07/03/2021    ECHOCARDIOGRAM REPORT   Patient Name:   Arthur Mcmahon Date of Exam: 07/03/2021 Medical Rec #:  [id number]      Height:       74.0 in Accession #:    [id number]     Weight:       187.0 lb Date of Birth:  09/28/1965      BSA:          2.112 m Patient Age:    55 years       BP:           101/65 mmHg Patient Gender: M              HR:           79 bpm. Exam Location:  [facility] Procedure: 2D Echo, Cardiac Doppler and Color Doppler Indications:    Bacteremia  History:        Patient has no prior history of Echocardiogram examinations.                 Risk Factors:Hypertension and Current Smoker. PAD, ETOH abuse.  Sonographer:    Alvino Chapel RCS Referring Phys: [place]  1. Left ventricular ejection fraction, by estimation, is 60 to 65%. The left ventricle has normal function. The left ventricle has no regional wall motion abnormalities. Left ventricular diastolic parameters were normal.  2. Right ventricular systolic function is normal. The right ventricular size is normal. Tricuspid regurgitation signal is inadequate for assessing PA pressure.  3. Right atrial size was mildly dilated.  4. The mitral valve is normal in structure. No evidence of mitral valve regurgitation. No evidence of mitral stenosis.  5. The aortic valve was not well visualized. Aortic valve regurgitation is not visualized. No aortic stenosis is present.  6. The inferior vena cava is normal in size with greater than 50% respiratory variability, suggesting right atrial pressure of 3 mmHg. Conclusion(s)/Recommendation(s): No evidence of valvular vegetations on  this transthoracic echocardiogram. Would recommend a transesophageal echocardiogram to exclude infective endocarditis if clinically indicated. FINDINGS  Left Ventricle: Left ventricular ejection fraction, by estimation, is 60 to 65%. The left ventricle has normal function. The left ventricle has no regional wall motion abnormalities. The left ventricular internal cavity size was normal in size. There is  no left ventricular hypertrophy. Left ventricular diastolic parameters were normal. Normal left ventricular filling pressure. Right Ventricle: The right ventricular size is normal. No increase in right ventricular wall thickness. Right ventricular systolic function is normal. Tricuspid regurgitation signal is inadequate for assessing PA pressure. Left Atrium: Left atrial size was normal in size. Right Atrium: Right atrial size was mildly dilated. Pericardium: There is no evidence of pericardial effusion. Mitral Valve: The mitral valve is normal in structure. No evidence of mitral valve regurgitation. No evidence of mitral valve  stenosis. Tricuspid Valve: The tricuspid valve is normal in structure. Tricuspid valve regurgitation is not demonstrated. No evidence of tricuspid stenosis. Aortic Valve: The aortic valve was not well visualized. Aortic valve regurgitation is not visualized. No aortic stenosis is present. Pulmonic Valve: The pulmonic valve was normal in structure. Pulmonic valve regurgitation is not visualized. No evidence of pulmonic stenosis. Aorta: The aortic root is normal in size and structure. Venous: The inferior vena cava is normal in size with greater than 50% respiratory variability, suggesting right atrial pressure of 3 mmHg. IAS/Shunts: The interatrial septum appears to be lipomatous. No atrial level shunt detected by color flow Doppler.  LEFT VENTRICLE PLAX 2D LVIDd:         5.10 cm   Diastology LVIDs:         3.20 cm   LV e' medial:    12.60 cm/s LV PW:         0.90 cm   LV E/e' medial:  6.8 LV IVS:        0.90 cm   LV e' lateral:   15.40 cm/s LVOT diam:     2.20 cm   LV E/e' lateral: 5.6 LV SV:         82 LV SV Index:   39 LVOT Area:     3.80 cm  RIGHT VENTRICLE RV S prime:     15.50 cm/s TAPSE (M-mode): 2.8 cm LEFT ATRIUM             Index        RIGHT ATRIUM           Index LA diam:        3.00 cm 1.42 cm/m   RA Area:     21.30 cm LA Vol (A2C):   44.0 ml 20.83 ml/m  RA Volume:   72.80 ml  34.47 ml/m LA Vol (A4C):   41.3 ml 19.55 ml/m LA Biplane Vol: 46.5 ml 22.01 ml/m  AORTIC VALVE LVOT Vmax:   114.00 cm/s LVOT Vmean:  76.300 cm/s LVOT VTI:    0.216 m  AORTA Ao Root diam: 3.50 cm MITRAL VALVE MV Area (PHT): 3.42 cm    SHUNTS MV Decel Time: 222 msec    Systemic VTI:  0.22 m MV E velocity: 85.70 cm/s  Systemic Diam: 2.20 cm MV A velocity: 67.20 cm/s MV E/A ratio:  1.28 Fransico Him MD Electronically signed by Fransico Him MD Signature Date/Time: 07/03/2021/3:53:25 PM    Final     Assessment/Plan: Bladder mass: 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 Planned TURBT for tissue diagnosis on 07/04/2021. Right hydroureteronephrosis: Due to obstruction of bladder mass. Creatinine normal on 07/04/2021 at 0.86 Right lower extremity ischemia: S/p revascularization with vascular surgery. Now with right lower extremity edema: Caused by extrinsic compression of right common femoral vein adenopathy. Acute blood loss anemia: S/p 1u pRBC 10/29. Hgb 8.1 on 10/31. Group G strep bacteremia: No vegetation by TEE. Repeat blood cx NGTD  -To OR today for TURBT to obtain tissue diagnosis. Discussed with patient that this will not be a complete resection given his widely metastatic disease. I discussed no role unfortunately for a cystectomy at this point. He will benefit from systemic therapy with medical oncology. Will consult however, pathology pending. -At this time, no need for R ureteral stent or PCN as he is asymptomatic and creatinine has normalized. However, if he redevelops AKI that would preclude chemotherapy, will consider R PCN.   LOS: 4 days   Arthur R. Nicholis Stepanek MD 07/04/2021, 8:56 AM [facility]  Pager: [phone]

## 2021-07-04 NOTE — Op Note (Signed)
Operative Note  Preoperative diagnosis:  1.  Bladder tumor 2. Right hydronephrosis  Postoperative diagnosis: 1.  Bladder tumor 2. Right hydronephrosis  Procedure(s): 1.  TURBT large 2.  Left retrograde pyelogram 3. Clot evacuation of the bladder.  Surgeon: Rexene Alberts, MD  Assistants:  None  Anesthesia:  General  Complications:  None  EBL:  Minimal  Specimens: 1.  ID Type Source Tests Collected by Time Destination  1 : Bladder Tumor Tissue PATH GU resection / TURBT / partial nephrectomy SURGICAL PATHOLOGY [name], MD 07/04/2021 1417     Drains/Catheters: 1.  22 Fr 3 way foley catheter  Intraoperative findings:   Large bladder tumor overlying trigone, right lateral wall and posterior wall. Unable to locate right ureteral orifice. I resected over the left ureteral orifice and left retrograde pyelogram demonstrated no left hydronephrosis. Excellent hemostasis. 161ml old clot was evacuated from the bladder.  Number of tumors: 3  Size of largest tumor:         7 cm    Characteristics of tumors:     Papillary  Yes  Nodular Yes  Primary: YES  Suspicious for Carcinoma in situ:    No  Clinical tumor stage:         cT3     Bimanual exam under anesthesia:        Evidence of large mass, mobile, not fixed  Visually complete resection:            No  Visualization of detrusor muscle in resection base:      Yes  Visual evaluation for perforation:        Evaluation was performed and no evidence of perforation   Indication:  Arthur Mcmahon is a 55 y.o. male with a large bladder mass with presumed metastatic stage IV [id number] bladder cancer on recent imaging.  He also had right hydroureteronephrosis to the level of the bladder due to the large mass.  Imaging demonstrated significant lymphadenopathy including retroperitoneal, pelvic, left supraclavicular, left axilla, left periaortic and retrocrural adenopathy.  He has been brought back for a TURBT today to obtain tissue  for pathologic diagnosis. All the risks, benefits were discussed with the patient to include but not limited to infection, pain, bleeding, damage to adjacent structures, need for further operations, adverse reaction to anesthesia and death.  Patient understands these risks and agrees to proceed with the operation as planned.    Description of procedure: After informed consent was obtained from the patient, the patient was taken to the operating room. General anesthesia was administered. The patient was placed in dorsal lithotomy position and prepped and draped in usual sterile fashion. Sequential compression devices were applied to lower extremities at the beginning of the case for DVT prophylaxis. Antibiotics were infused prior to surgery start time. A surgical time-out was performed to properly identify the patient, the surgery to be performed, and the surgical site.     We then passed the 21-French rigid cystoscope down the urethra and into the bladder under direct vision without any difficulty. The anterior urethral was normal. The prostate was non-obstructing. The bladder was inspected with 30 and 70 degree lenses. I evacuated 161ml old clot from his bladder. Once in the bladder, systematic evaluation of bladder revealed a large bladder mass overlying the trigone, right lateral wall and posterior wall. The ureteral orfices were in orthotopic position and not involved.   We then removed the cystoscope and then passed down the 26 French resectoscope sheath down  the urethra into the bladder under direct vision with the visual obturator. The tumor was resected down to muscle. This was an incomplete resection as he had widely metastatic disease and this was for tissue pathology.  The TUR bladder tumor chips were retrieved from the bladder and each region of resection was passed off the field as a separate specimen.  Hemostasis was achieved using electrocautery. We then proceeded with removing the  resectoscope. A left retrograde pyelogram demonstrated no left hydronephrosis. I was unable to perform a right retrograde pyelogram as I could not locate the ureter.  I placed a 22 Fr 3 way foley catheter and started the continuous bladder irrigation system.  The patient tolerated the procedure well with no complication and was awoken from anesthesia and taken to recovery in stable condition.     Plan:  F/u pathology. He will require med/onc evaluation to start systemic chemotherapy for his presumed widely metastatic bladder cancer.  [facility]  Pager: [phone]

## 2021-07-04 NOTE — Progress Notes (Addendum)
Day of Surgery Subjective: Denies pain. No nausea or emesis. Tolerating foley. Denies abdominal pain or flank pain.  Objective: Vital signs in last 24 hours: Temp:  [97.9 F (36.6 C)-98.5 F (36.9 C)] 98.5 F (36.9 C) (10/31 0615) Pulse Rate:  [73-97] 73 (10/31 0615) Resp:  [18-49] 49 (10/31 0801) BP: (107-115)/(67-70) 107/69 (10/31 0615) SpO2:  [98 %-100 %] 100 % (10/31 0615)  Intake/Output from previous day: 10/30 0701 - 10/31 0700 In: 1479.5 [P.O.:480; I.V.:909.5] Out: 2500 [Urine:2500] Intake/Output this shift: No intake/output data recorded.  Physical Exam:  Supraclavicular left sided node, non-tender General: Alert and oriented CV: RRR Lungs: Clear Abdomen: Soft, ND, NT Ext: NT, No erythema  Lab Results: Recent Labs    07/03/21 0128 07/03/21 1622 07/04/21 0453  HGB 8.0* 7.9* 8.1*  HCT 23.4* 23.2* 23.7*   BMET Recent Labs    07/03/21 0545 07/03/21 1238 07/04/21 0453 07/04/21 0744  NA 122*   < > 126* 125*  K 3.9  --  3.9  --   CL 90*  --  95*  --   CO2 26  --  26  --   GLUCOSE 95  --  92  --   BUN 10  --  8  --   CREATININE 0.93  --  0.86  --   CALCIUM 7.7*  --  7.8*  --    < > = values in this interval not displayed.     Studies/Results: ECHOCARDIOGRAM COMPLETE  Result Date: 07/03/2021    ECHOCARDIOGRAM REPORT   Patient Name:   MARVENS HOLLARS Date of Exam: 07/03/2021 Medical Rec #:  [id number]      Height:       74.0 in Accession #:    [id number]     Weight:       187.0 lb Date of Birth:  Feb 18, 1966      BSA:          2.112 m Patient Age:    35 years       BP:           101/65 mmHg Patient Gender: M              HR:           79 bpm. Exam Location:  [facility] Procedure: 2D Echo, Cardiac Doppler and Color Doppler Indications:    Bacteremia  History:        Patient has no prior history of Echocardiogram examinations.                 Risk Factors:Hypertension and Current Smoker. PAD, ETOH abuse.  Sonographer:    Alvino Chapel RCS Referring Phys: [place]  1. Left ventricular ejection fraction, by estimation, is 60 to 65%. The left ventricle has normal function. The left ventricle has no regional wall motion abnormalities. Left ventricular diastolic parameters were normal.  2. Right ventricular systolic function is normal. The right ventricular size is normal. Tricuspid regurgitation signal is inadequate for assessing PA pressure.  3. Right atrial size was mildly dilated.  4. The mitral valve is normal in structure. No evidence of mitral valve regurgitation. No evidence of mitral stenosis.  5. The aortic valve was not well visualized. Aortic valve regurgitation is not visualized. No aortic stenosis is present.  6. The inferior vena cava is normal in size with greater than 50% respiratory variability, suggesting right atrial pressure of 3 mmHg. Conclusion(s)/Recommendation(s): No evidence of valvular vegetations on  this transthoracic echocardiogram. Would recommend a transesophageal echocardiogram to exclude infective endocarditis if clinically indicated. FINDINGS  Left Ventricle: Left ventricular ejection fraction, by estimation, is 60 to 65%. The left ventricle has normal function. The left ventricle has no regional wall motion abnormalities. The left ventricular internal cavity size was normal in size. There is  no left ventricular hypertrophy. Left ventricular diastolic parameters were normal. Normal left ventricular filling pressure. Right Ventricle: The right ventricular size is normal. No increase in right ventricular wall thickness. Right ventricular systolic function is normal. Tricuspid regurgitation signal is inadequate for assessing PA pressure. Left Atrium: Left atrial size was normal in size. Right Atrium: Right atrial size was mildly dilated. Pericardium: There is no evidence of pericardial effusion. Mitral Valve: The mitral valve is normal in structure. No evidence of mitral valve regurgitation. No evidence of mitral valve  stenosis. Tricuspid Valve: The tricuspid valve is normal in structure. Tricuspid valve regurgitation is not demonstrated. No evidence of tricuspid stenosis. Aortic Valve: The aortic valve was not well visualized. Aortic valve regurgitation is not visualized. No aortic stenosis is present. Pulmonic Valve: The pulmonic valve was normal in structure. Pulmonic valve regurgitation is not visualized. No evidence of pulmonic stenosis. Aorta: The aortic root is normal in size and structure. Venous: The inferior vena cava is normal in size with greater than 50% respiratory variability, suggesting right atrial pressure of 3 mmHg. IAS/Shunts: The interatrial septum appears to be lipomatous. No atrial level shunt detected by color flow Doppler.  LEFT VENTRICLE PLAX 2D LVIDd:         5.10 cm   Diastology LVIDs:         3.20 cm   LV e' medial:    12.60 cm/s LV PW:         0.90 cm   LV E/e' medial:  6.8 LV IVS:        0.90 cm   LV e' lateral:   15.40 cm/s LVOT diam:     2.20 cm   LV E/e' lateral: 5.6 LV SV:         82 LV SV Index:   39 LVOT Area:     3.80 cm  RIGHT VENTRICLE RV S prime:     15.50 cm/s TAPSE (M-mode): 2.8 cm LEFT ATRIUM             Index        RIGHT ATRIUM           Index LA diam:        3.00 cm 1.42 cm/m   RA Area:     21.30 cm LA Vol (A2C):   44.0 ml 20.83 ml/m  RA Volume:   72.80 ml  34.47 ml/m LA Vol (A4C):   41.3 ml 19.55 ml/m LA Biplane Vol: 46.5 ml 22.01 ml/m  AORTIC VALVE LVOT Vmax:   114.00 cm/s LVOT Vmean:  76.300 cm/s LVOT VTI:    0.216 m  AORTA Ao Root diam: 3.50 cm MITRAL VALVE MV Area (PHT): 3.42 cm    SHUNTS MV Decel Time: 222 msec    Systemic VTI:  0.22 m MV E velocity: 85.70 cm/s  Systemic Diam: 2.20 cm MV A velocity: 67.20 cm/s MV E/A ratio:  1.28 Fransico Him MD Electronically signed by Fransico Him MD Signature Date/Time: 07/03/2021/3:53:25 PM    Final     Assessment/Plan: Bladder mass: presumed metastatic Stage IVA [id number] urothelial carcinoma of the bladder, (right  hydroureteronephrosis) with widely metastatic lymphadenopathy (extensive retroperitoneal,  pelvic, left supraclavicular, left axilla, left para-aortic, retrocrural adenopathy). Planned TURBT for tissue diagnosis on 07/04/2021. Right hydroureteronephrosis: Due to obstruction of bladder mass. Creatinine normal on 07/04/2021 at 0.86 Right lower extremity ischemia: S/p revascularization with vascular surgery. Now with right lower extremity edema: Caused by extrinsic compression of right common femoral vein adenopathy. Acute blood loss anemia: S/p 1u pRBC 10/29. Hgb 8.1 on 10/31. Group G strep bacteremia: No vegetation by TEE. Repeat blood cx NGTD  -To OR today for TURBT to obtain tissue diagnosis. Discussed with patient that this will not be a complete resection given his widely metastatic disease. I discussed no role unfortunately for a cystectomy at this point. He will benefit from systemic therapy with medical oncology. Will consult however, pathology pending. -At this time, no need for R ureteral stent or PCN as he is asymptomatic and creatinine has normalized. However, if he redevelops AKI that would preclude chemotherapy, will consider R PCN.   LOS: 4 days   Matt R. Aloysious Vangieson MD 07/04/2021, 8:56 AM [facility]  Pager: [phone]

## 2021-07-04 NOTE — Interval H&P Note (Signed)
History and Physical Interval Note:  07/04/2021 1:25 PM  Arthur Mcmahon  has presented today for surgery, with the diagnosis of BLADDER MASS.  The various methods of treatment have been discussed with the patient and family. After consideration of risks, benefits and other options for treatment, the patient has consented to  Procedure(s): TRANSURETHRAL RESECTION OF BLADDER TUMOR (TURBT) POSSIBLE BILATERAL RETROGRADE PYELOGRAM POSSIBLE BILATER STENTS (Bilateral) as a surgical intervention.  The patient's history has been reviewed, patient examined, no change in status, stable for surgery.  I have reviewed the patient's chart and labs.  Questions were answered to the patient's satisfaction.     [name]

## 2021-07-04 NOTE — Anesthesia Postprocedure Evaluation (Signed)
Anesthesia Post Note  Patient: Arthur Mcmahon  Procedure(s) Performed: TRANSURETHRAL RESECTION OF BLADDER TUMOR (TURBT), LEFT RETROGRADE PYELOGRAM     Patient location during evaluation: PACU Anesthesia Type: General Level of consciousness: sedated Pain management: pain level controlled Vital Signs Assessment: post-procedure vital signs reviewed and stable Respiratory status: spontaneous breathing and respiratory function stable Cardiovascular status: stable Postop Assessment: no apparent nausea or vomiting Anesthetic complications: no   No notable events documented.  Last Vitals:  Vitals:   07/04/21 1630 07/04/21 1744  BP:  109/71  Pulse:  66  Resp: 17 15  Temp:  36.5 C  SpO2:  100%    Last Pain:  Vitals:   07/04/21 2009  TempSrc:   PainSc: 8                  Crystalle Popwell DANIEL

## 2021-07-04 NOTE — TOC Progression Note (Signed)
Transition of Care [facility]) - Progression Note    Patient Details  Name: Arthur Mcmahon MRN: [id number] Date of Birth: 1966-02-01  Transition of Care [facility]) CM/SW Contact  Leeroy Cha, RN Phone Number: 07/04/2021, 8:30 AM  Clinical Narrative:    Pt is a smoker with etoh abuse, has a rt groin wound that has a wound vac attached to dressing changes on M,W,F. Iv rocephin and iuv ns at 100cc/hr, wcb 11.3 hgb 8.1 nz is 126. TOC PLAN OF CARE: Will follow for hhc needs if goes home with wound vac. Following for rpogression   Expected Discharge Plan: Home/Self Care Barriers to Discharge: Continued Medical Work up  Expected Discharge Plan and Services Expected Discharge Plan: Home/Self Care                                               Social Determinants of Health (SDOH) Interventions    Readmission Risk Interventions Readmission Risk Prevention Plan 06/22/2021  Post Dischage Appt Complete  Medication Screening Complete  Transportation Screening Complete  Some recent data might be hidden

## 2021-07-04 NOTE — Progress Notes (Signed)
Oncology brief note   I received a call from Dr. Abner Greenspan today for this pt with metastatic bladder cancer. I will schedule pt to see my partner Dr. Alen Blew as soon as he is discharged from hospital.   Truitt Merle MD 07/04/2021

## 2021-07-04 NOTE — Progress Notes (Signed)
    [place] for Infectious Disease   Reason for visit: Follow up on Strep bacteremia  Interval History: currently in PACU after TURBT  Physical Exam: Constitutional:  Vitals:   07/04/21 1504 07/04/21 1515  BP: 121/73 111/77  Pulse: 76 77  Resp: 12 16  Temp:    SpO2: 100% 100%   patient appears in NAD  Impression: bacteremia and bladder tumor.   Plan: 1.  No changes, continue on ceftriaxone.

## 2021-07-04 NOTE — Anesthesia Preprocedure Evaluation (Addendum)
Anesthesia Evaluation  Patient identified by MRN, date of birth, ID band Patient awake    Reviewed: Allergy & Precautions, NPO status , Patient's Chart, lab work & pertinent test results  History of Anesthesia Complications Negative for: history of anesthetic complications  Airway Mallampati: I  TM Distance: >3 FB Neck ROM: Full    Dental  (+) Poor Dentition, Missing, Dental Advisory Given   Pulmonary Current Smoker and Patient abstained from smoking.,  Current smoker, 13 pack year history   Pulmonary exam normal        Cardiovascular hypertension (no meds), + Peripheral Vascular Disease (eliquis)  Normal cardiovascular exam  Echo 06/2021: 1. Left ventricular ejection fraction, by estimation, is 60 to 65%. The  left ventricle has normal function. The left ventricle has no regional  wall motion abnormalities. Left ventricular diastolic parameters were  normal.  2. Right ventricular systolic function is normal. The right ventricular  size is normal. Tricuspid regurgitation signal is inadequate for assessing  PA pressure.  3. Right atrial size was mildly dilated.  4. The mitral valve is normal in structure. No evidence of mitral valve  regurgitation. No evidence of mitral stenosis.  5. The aortic valve was not well visualized. Aortic valve regurgitation  is not visualized. No aortic stenosis is present.  6. The inferior vena cava is normal in size with greater than 50%  respiratory variability, suggesting right atrial pressure of 3 mmHg.    Neuro/Psych negative neurological ROS  negative psych ROS   GI/Hepatic negative GI ROS, (+)     substance abuse (etOH abuse)  marijuana use,   Endo/Other  negative endocrine ROS  Renal/GU  Bladder dysfunction (bladder mass)      Musculoskeletal negative musculoskeletal ROS (+)   Abdominal   Peds  Hematology  (+) Blood dyscrasia, anemia , hct 23.7, Hb 8.1    Anesthesia Other Findings   Reproductive/Obstetrics negative OB ROS                            Anesthesia Physical Anesthesia Plan  ASA: 3  Anesthesia Plan: General   Post-op Pain Management:    Induction: Intravenous  PONV Risk Score and Plan: 2 and Ondansetron, Dexamethasone, Midazolam and Treatment may vary due to age or medical condition  Airway Management Planned: LMA  Additional Equipment: None  Intra-op Plan:   Post-operative Plan: Extubation in OR  Informed Consent: I have reviewed the patients History and Physical, chart, labs and discussed the procedure including the risks, benefits and alternatives for the proposed anesthesia with the patient or authorized representative who has indicated his/her understanding and acceptance.     Dental advisory given  Plan Discussed with: CRNA and Anesthesiologist  Anesthesia Plan Comments:        Anesthesia Quick Evaluation

## 2021-07-04 NOTE — Transfer of Care (Signed)
Immediate Anesthesia Transfer of Care Note  Patient: Arthur Mcmahon  Procedure(s) Performed: TRANSURETHRAL RESECTION OF BLADDER TUMOR (TURBT), LEFT RETROGRADE PYELOGRAM  Patient Location: PACU  Anesthesia Type:General  Level of Consciousness: awake, drowsy and patient cooperative  Airway & Oxygen Therapy: Patient Spontanous Breathing and Patient connected to face mask oxygen  Post-op Assessment: Report given to RN and Post -op Vital signs reviewed and stable  Post vital signs: Reviewed and stable  Last Vitals:  Vitals Value Taken Time  BP 121/73 07/04/21 1504  Temp    Pulse 82 07/04/21 1507  Resp 24 07/04/21 1507  SpO2 100 % 07/04/21 1507  Vitals shown include unvalidated device data.  Last Pain:  Vitals:   07/04/21 1323  TempSrc:   PainSc: 7       Patients Stated Pain Goal: 6 (61/47/09 2957)  Complications: No notable events documented.

## 2021-07-04 NOTE — Discharge Instructions (Signed)

## 2021-07-04 NOTE — Progress Notes (Signed)
PROGRESS NOTE  Arthur Mcmahon  [facility]:[id number] DOB: [birth year] DOA: 06/30/2021 PCP: Shary Key, DO   Brief Narrative: Arthur Mcmahon is a 55 y.o. male with a history of endarterectomy and fistulotomy with thrombectomy for critical right LE ischemia started on eliquis who was sent to the ED 10/27 for recurrent gross hematuria, generalized weakness, lethargy. He was found to have low grade fever, WBC 26.1k and blood culture drawn grew group G strep for which ceftriaxone has been given. AKI was noted with SCr 1.56, severe hyponatremia also noted with Na 115 for which nephrology was consulted. Hgb was 9g/dl, trended downward to 7 for which 1u PRBCs was given 10/29. TURBT is scheduled 10/31.  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 Chest CT revealed pathologic adenopathy within the left supraclavicular region, left axilla, and left para-aortic region. Borderline enlarged lymph nodes within the retrocrural region and retroperitoneum - Urology planning TURBT 10/31.  - Further recommendations/consultation pending this tissue diagnosis.  Critical RLE ischemia and swelling: s/p revascularization by vascular surgery PTA. Compression of the right common femoral vein by adenopathy causing right lower extremity swelling/edema seen on CT. Echo ruled out LV systolic dysfunction. - Vascular surgery consulted and has followed the patient. Will continue wound vac to inguinal incision with suspected lymphatic drainage. Routine wound care recommended for lower wounds. - Elevate extremity as able  Acute blood loss anemia with ongoing gross hematuria: s/p 1u PRBCs 10/29. - Trend CBC daily,  transfuse if symptomatic or hgb < 7 g/dl. Hgb 8.1g/dl this AM. Will defer to urology whether anticipated EBL would justify additional transfusion at this time or we should just recheck in AM. - Ok to hold anticoagulation per vascular surgery.  Hyponatremia: Urine Na is low, remains low. Suggests hypovolemic etiology. Has improved since initiation of isotonic saline.  - Appreciate nephrology recommendations. Continue NS. - Continue fluid restriction  Group G strep bacteremia: On admission aerobic collection, not in other collections. Leukocytosis improving on ceftriaxone 2g to which susceptibility has been proven on culture.  - I appreciate ID input regarding this case. No vegetation by TTE. Repeat blood culture thus far NGTD. - Continue CTX 2g IV q24h.  AKI: CrCl has improved significantly >60ml/min not consistent with CKD. Right hydronephrosis noted.  - Monitor BMP, avoid nephrotoxins/further contrast if possible  Tobacco use:  - Cessation counseling.  RN Pressure Injury Documentation: Pressure Injury 07/01/21 Perineum Mid Stage 2 -  Partial thickness loss of dermis presenting as a shallow open injury with a red, pink wound bed without slough. wound is red with skin break down in the middle of the injury (Active)  07/01/21 2312  Location: Perineum  Location Orientation: Mid  Staging: Stage 2 -  Partial thickness loss of dermis presenting as a shallow open injury with a red, pink wound bed without slough.  Wound Description (Comments): wound is red with skin break down in the middle of the injury  Present on Admission: Yes   DVT prophylaxis: SCDs Code Status: Full Family Communication: None at bedside Disposition Plan:  Status is: Inpatient  Remains inpatient appropriate because: Severe hyponatremia, bacteremia, transfusion-dependent anemia w/ongoing hematuria.  Consultants:  Urology Vascular surgery Nephrology Infectious disease  Procedures:  TURBT planned  07/04/2021  Antimicrobials: Ceftriaxone 2g q24h  Subjective: Had leak around foley yesterday resolved with irrigation. Still some hematuria but lightened in color. No chest pain or dyspnea reported. Leg discomfort is stable.   Objective: Vitals:   07/03/21 1250 07/03/21 2212 07/04/21 0615 07/04/21 0801  BP: 115/70 113/67 107/69   Pulse: 97 77 73   Resp: 18 20 (!) 21 (!) 49  Temp: 97.9 F (36.6 C) 98.5 F (36.9 C) 98.5 F (36.9 C)   TempSrc: Oral Oral Oral   SpO2: 98% 100% 100%   Weight:      Height:        Intake/Output Summary (Last 24 hours) at 07/04/2021 1005 Last data filed at 07/04/2021 0100 Gross per 24 hour  Intake 1479.48 ml  Output 2500 ml  Net -1020.52 ml   Filed Weights   07/01/21 1906  Weight: 84.8 kg   Gen: 55 y.o. male in no distress Pulm: Nonlabored breathing room air. Clear. CV: Regular rate and rhythm. No murmur, rub, or gallop. No JVD, R > L pitting edema is roughly stable. GI: Abdomen soft, non-tender, non-distended, with normoactive bowel sounds.  Ext: Warm, dry, no deformities Skin: No new rashes, lesions or ulcers on visualized skin. Inguinal incision with black wound packing under vac with good seal, continued significant serous discharge in vac without surrounding erythema. No surrounding erythema or induration on any wounds. Neuro: Alert and oriented. No focal neurological deficits. Psych: Judgement and insight appear fair. Mood euthymic & affect congruent. Behavior is appropriate.    Data Reviewed: I have personally reviewed following labs and imaging studies  CBC: Recent Labs  Lab 06/30/21 1758 07/01/21 0412 07/01/21 0655 07/01/21 1149 07/02/21 0246 07/02/21 1209 07/02/21 1714 07/03/21 0128 07/03/21 1622 07/04/21 0453  WBC 26.1* 16.3* 15.6* 15.2* 11.3*  --   --   --   --  11.3*  NEUTROABS 24.0*  --   --   --   --   --   --   --   --  9.4*  HGB 9.0* 7.6* 7.3* 7.4* 7.0* 8.6* 8.4* 8.0* 7.9* 8.1*  HCT 26.2* 21.3* 21.7* 21.1* 19.8*  25.5* 25.0* 23.4* 23.2* 23.7*  MCV 94.2 93.8 95.2 93.4 93.4  --   --   --   --  93.3  PLT 498* 389 382 337 338  --   --   --   --  867   Basic Metabolic Panel: Recent Labs  Lab 07/02/21 0246 07/02/21 0609 07/02/21 1209 07/03/21 0128 07/03/21 0545 07/03/21 1238 07/03/21 1622 07/03/21 2009 07/04/21 0011 07/04/21 0453 07/04/21 0744  NA  --  120* 121* 122* 122*   < > 123* 125* 122* 126* 125*  K  --  3.8 4.1 3.7 3.9  --   --   --   --  3.9  --   CL  --  89* 88* 89* 90*  --   --   --   --  95*  --   CO2  --  23 24 26 26   --   --   --   --  26  --   GLUCOSE  --  99 86 106* 95  --   --   --   --  92  --   BUN  --  11 11 11 10   --   --   --   --  8  --   CREATININE  --  1.09 1.07 0.93 0.93  --   --   --   --  0.86  --   CALCIUM  --  7.7* 7.7* 7.7* 7.7*  --   --   --   --  7.8*  --   MG 2.0  --   --   --   --   --   --   --   --   --   --   PHOS 2.2*  --   --   --   --   --   --   --   --   --   --    < > = values in this interval not displayed.   GFR: Estimated Creatinine Clearance: 114.2 mL/min (by C-G formula based on SCr of 0.86 mg/dL). Liver Function Tests: Recent Labs  Lab 06/30/21 1758 07/04/21 0453  AST 21 49*  ALT 13 32  ALKPHOS 85 74  BILITOT 1.3* 0.7  PROT 7.2 5.7*  ALBUMIN 2.4* 2.3*   No results for input(s): LIPASE, AMYLASE in the last 168 hours. No results for input(s): AMMONIA in the last 168 hours. Coagulation Profile: No results for input(s): INR, PROTIME in the last 168 hours. Cardiac Enzymes: No results for input(s): CKTOTAL, CKMB, CKMBINDEX, TROPONINI in the last 168 hours. BNP (last 3 results) No results for input(s): PROBNP in the last 8760 hours. HbA1C: No results for input(s): HGBA1C in the last 72 hours. CBG: Recent Labs  Lab 07/02/21 2154  GLUCAP 117*   Lipid Profile: No results for input(s): CHOL, HDL, LDLCALC, TRIG, CHOLHDL, LDLDIRECT in the last 72 hours. Thyroid Function Tests: No results for input(s): TSH, T4TOTAL, FREET4, T3FREE,  THYROIDAB in the last 72 hours.  Anemia Panel: No results for input(s): VITAMINB12, FOLATE, FERRITIN, TIBC, IRON, RETICCTPCT in the last 72 hours. Urine analysis:    Component Value Date/Time   COLORURINE RED (A) 07/01/2021 0020   APPEARANCEUR TURBID (A) 07/01/2021 0020   LABSPEC  07/01/2021 0020    TEST NOT REPORTED DUE TO COLOR INTERFERENCE OF URINE PIGMENT   PHURINE  07/01/2021 0020    TEST NOT REPORTED DUE TO COLOR INTERFERENCE OF URINE PIGMENT   GLUCOSEU (A) 07/01/2021 0020    TEST NOT REPORTED DUE TO COLOR INTERFERENCE OF URINE PIGMENT   HGBUR (A) 07/01/2021 0020    TEST NOT REPORTED DUE TO COLOR INTERFERENCE OF URINE PIGMENT   BILIRUBINUR (A) 07/01/2021 0020    TEST NOT REPORTED DUE TO COLOR INTERFERENCE OF URINE PIGMENT   BILIRUBINUR small (A) 03/25/2021 1415   KETONESUR (A) 07/01/2021 0020    TEST NOT REPORTED DUE TO COLOR INTERFERENCE OF URINE PIGMENT   PROTEINUR (A) 07/01/2021 0020    TEST NOT REPORTED DUE TO COLOR INTERFERENCE OF URINE PIGMENT   UROBILINOGEN 1.0 03/25/2021 1415   NITRITE (A) 07/01/2021 0020    TEST NOT REPORTED DUE TO COLOR INTERFERENCE OF URINE PIGMENT   LEUKOCYTESUR (A) 07/01/2021 0020    TEST NOT REPORTED DUE TO COLOR INTERFERENCE OF URINE PIGMENT   Recent Results (from the past 240 hour(s))  Blood culture (routine x 2)     Status: Abnormal   Collection Time: 06/30/21  5:58 PM   Specimen: Site Not Specified; Blood  Result Value Ref Range Status   Specimen Description SITE NOT SPECIFIED  Final   Special Requests   Final    BOTTLES DRAWN AEROBIC AND ANAEROBIC Blood Culture results may not be optimal due to an excessive volume of blood received in culture bottles   Culture  Setup Time   Final  GRAM POSITIVE COCCI IN CHAINS AEROBIC BOTTLE ONLY CRITICAL RESULT CALLED TO, READ BACK BY AND VERIFIED WITH: PHARMD E BREWING [id number] AT 47 AM BY CM Performed at [facility], [place] [address] [place], [place] [address]    Culture STREPTOCOCCUS  GROUP G (A)  Final   Report Status 07/03/2021 FINAL  Final   Organism ID, Bacteria STREPTOCOCCUS GROUP G  Final      Susceptibility   Streptococcus group g - MIC*    CLINDAMYCIN <=0.25 SENSITIVE Sensitive     AMPICILLIN <=0.25 SENSITIVE Sensitive     ERYTHROMYCIN <=0.12 SENSITIVE Sensitive     VANCOMYCIN 0.5 SENSITIVE Sensitive     CEFTRIAXONE <=0.12 SENSITIVE Sensitive     LEVOFLOXACIN <=0.25 SENSITIVE Sensitive     * STREPTOCOCCUS GROUP G  Blood Culture ID Panel (Reflexed)     Status: Abnormal   Collection Time: 06/30/21  5:58 PM  Result Value Ref Range Status   Enterococcus faecalis NOT DETECTED NOT DETECTED Final   Enterococcus Faecium NOT DETECTED NOT DETECTED Final   Listeria monocytogenes NOT DETECTED NOT DETECTED Final   Staphylococcus species NOT DETECTED NOT DETECTED Final   Staphylococcus aureus (BCID) NOT DETECTED NOT DETECTED Final   Staphylococcus epidermidis NOT DETECTED NOT DETECTED Final   Staphylococcus lugdunensis NOT DETECTED NOT DETECTED Final   Streptococcus species DETECTED (A) NOT DETECTED Final    Comment: Not Enterococcus species, Streptococcus agalactiae, Streptococcus pyogenes, or Streptococcus pneumoniae. CRITICAL RESULT CALLED TO, READ BACK BY AND VERIFIED WITH: PHARMD E BREWING [id number] AT 59 AM BY CM    Streptococcus agalactiae NOT DETECTED NOT DETECTED Final   Streptococcus pneumoniae NOT DETECTED NOT DETECTED Final   Streptococcus pyogenes NOT DETECTED NOT DETECTED Final   A.calcoaceticus-baumannii NOT DETECTED NOT DETECTED Final   Bacteroides fragilis NOT DETECTED NOT DETECTED Final   Enterobacterales NOT DETECTED NOT DETECTED Final   Enterobacter cloacae complex NOT DETECTED NOT DETECTED Final   Escherichia coli NOT DETECTED NOT DETECTED Final   Klebsiella aerogenes NOT DETECTED NOT DETECTED Final   Klebsiella oxytoca NOT DETECTED NOT DETECTED Final   Klebsiella pneumoniae NOT DETECTED NOT DETECTED Final   Proteus species NOT DETECTED NOT  DETECTED Final   Salmonella species NOT DETECTED NOT DETECTED Final   Serratia marcescens NOT DETECTED NOT DETECTED Final   Haemophilus influenzae NOT DETECTED NOT DETECTED Final   Neisseria meningitidis NOT DETECTED NOT DETECTED Final   Pseudomonas aeruginosa NOT DETECTED NOT DETECTED Final   Stenotrophomonas maltophilia NOT DETECTED NOT DETECTED Final   Candida albicans NOT DETECTED NOT DETECTED Final   Candida auris NOT DETECTED NOT DETECTED Final   Candida glabrata NOT DETECTED NOT DETECTED Final   Candida krusei NOT DETECTED NOT DETECTED Final   Candida parapsilosis NOT DETECTED NOT DETECTED Final   Candida tropicalis NOT DETECTED NOT DETECTED Final   Cryptococcus neoformans/gattii NOT DETECTED NOT DETECTED Final    Comment: Performed at [facility], [address] [place], [place] [address]  Blood culture (routine x 2)     Status: None (Preliminary result)   Collection Time: 07/01/21 12:16 AM   Specimen: BLOOD RIGHT FOREARM  Result Value Ref Range Status   Specimen Description BLOOD RIGHT FOREARM  Final   Special Requests   Final    BOTTLES DRAWN AEROBIC AND ANAEROBIC Blood Culture adequate volume   Culture   Final    NO GROWTH 3 DAYS Performed at [facility], [place] [address], [place], [place] [address]  Report Status PENDING  Incomplete  Resp Panel by RT-PCR (Flu A&B, Covid) Nasopharyngeal Swab     Status: None   Collection Time: 07/01/21 12:21 AM   Specimen: Nasopharyngeal Swab; Nasopharyngeal(NP) swabs in vial transport medium  Result Value Ref Range Status   SARS Coronavirus 2 by RT PCR NEGATIVE NEGATIVE Final    Comment: (NOTE) SARS-CoV-2 target nucleic acids are NOT DETECTED.  The SARS-CoV-2 RNA is generally detectable in upper respiratory specimens during the acute phase of infection. The lowest concentration of SARS-CoV-2 viral copies this assay can detect is 138 copies/mL. A negative result does not preclude SARS-Cov-2 infection and should not be  used as the sole basis for treatment or other patient management decisions. A negative result may occur with  improper specimen collection/handling, submission of specimen other than nasopharyngeal swab, presence of viral mutation(s) within the areas targeted by this assay, and inadequate number of viral copies(<138 copies/mL). A negative result must be combined with clinical observations, patient history, and epidemiological information. The expected result is Negative.  Fact Sheet for Patients:  EntrepreneurPulse.com.au  Fact Sheet for Healthcare Providers:  IncredibleEmployment.be  This test is no t yet approved or cleared by the [place] FDA and  has been authorized for detection and/or diagnosis of SARS-CoV-2 by FDA under an Emergency Use Authorization (EUA). This EUA will remain  in effect (meaning this test can be used) for the duration of the COVID-19 declaration under Section 564(b)(1) of the Act, 21 U.S.C.section 360bbb-3(b)(1), unless the authorization is terminated  or revoked sooner.       Influenza A by PCR NEGATIVE NEGATIVE Final   Influenza B by PCR NEGATIVE NEGATIVE Final    Comment: (NOTE) The Xpert Xpress SARS-CoV-2/FLU/RSV plus assay is intended as an aid in the diagnosis of influenza from Nasopharyngeal swab specimens and should not be used as a sole basis for treatment. Nasal washings and aspirates are unacceptable for Xpert Xpress SARS-CoV-2/FLU/RSV testing.  Fact Sheet for Patients: EntrepreneurPulse.com.au  Fact Sheet for Healthcare Providers: IncredibleEmployment.be  This test is not yet approved or cleared by the [place] FDA and has been authorized for detection and/or diagnosis of SARS-CoV-2 by FDA under an Emergency Use Authorization (EUA). This EUA will remain in effect (meaning this test can be used) for the duration of the COVID-19 declaration under Section  564(b)(1) of the Act, 21 U.S.C. section 360bbb-3(b)(1), unless the authorization is terminated or revoked.  Performed at [facility], [place] [address], [place], [place] [address]   Surgical PCR screen     Status: None   Collection Time: 07/04/21  6:21 AM   Specimen: Nasal Mucosa; Nasal Swab  Result Value Ref Range Status   MRSA, PCR NEGATIVE NEGATIVE Final   Staphylococcus aureus NEGATIVE NEGATIVE Final    Comment: (NOTE) The Xpert SA Assay (FDA approved for NASAL specimens in patients 66 years of age and older), is one component of a comprehensive surveillance program. It is not intended to diagnose infection nor to guide or monitor treatment. Performed at [facility], [place] [address], [place], [place] [address]       Radiology Studies: ECHOCARDIOGRAM COMPLETE  Result Date: 07/03/2021    ECHOCARDIOGRAM REPORT   Patient Name:   MEILECH VIRTS Date of Exam: 07/03/2021 Medical Rec #:  [id number]      Height:       74.0 in Accession #:    [id number]     Weight:  187.0 lb Date of Birth:  February 10, 1966      BSA:          2.112 m Patient Age:    60 years       BP:           101/65 mmHg Patient Gender: M              HR:           79 bpm. Exam Location:  [facility] Procedure: 2D Echo, Cardiac Doppler and Color Doppler Indications:    Bacteremia  History:        Patient has no prior history of Echocardiogram examinations.                 Risk Factors:Hypertension and Current Smoker. PAD, ETOH abuse.  Sonographer:    Alvino Chapel RCS Referring Phys: [place]  1. Left ventricular ejection fraction, by estimation, is 60 to 65%. The left ventricle has normal function. The left ventricle has no regional wall motion abnormalities. Left ventricular diastolic parameters were normal.  2. Right ventricular systolic function is normal. The right ventricular size is normal. Tricuspid regurgitation signal is inadequate for assessing PA pressure.  3. Right atrial  size was mildly dilated.  4. The mitral valve is normal in structure. No evidence of mitral valve regurgitation. No evidence of mitral stenosis.  5. The aortic valve was not well visualized. Aortic valve regurgitation is not visualized. No aortic stenosis is present.  6. The inferior vena cava is normal in size with greater than 50% respiratory variability, suggesting right atrial pressure of 3 mmHg. Conclusion(s)/Recommendation(s): No evidence of valvular vegetations on this transthoracic echocardiogram. Would recommend a transesophageal echocardiogram to exclude infective endocarditis if clinically indicated. FINDINGS  Left Ventricle: Left ventricular ejection fraction, by estimation, is 60 to 65%. The left ventricle has normal function. The left ventricle has no regional wall motion abnormalities. The left ventricular internal cavity size was normal in size. There is  no left ventricular hypertrophy. Left ventricular diastolic parameters were normal. Normal left ventricular filling pressure. Right Ventricle: The right ventricular size is normal. No increase in right ventricular wall thickness. Right ventricular systolic function is normal. Tricuspid regurgitation signal is inadequate for assessing PA pressure. Left Atrium: Left atrial size was normal in size. Right Atrium: Right atrial size was mildly dilated. Pericardium: There is no evidence of pericardial effusion. Mitral Valve: The mitral valve is normal in structure. No evidence of mitral valve regurgitation. No evidence of mitral valve stenosis. Tricuspid Valve: The tricuspid valve is normal in structure. Tricuspid valve regurgitation is not demonstrated. No evidence of tricuspid stenosis. Aortic Valve: The aortic valve was not well visualized. Aortic valve regurgitation is not visualized. No aortic stenosis is present. Pulmonic Valve: The pulmonic valve was normal in structure. Pulmonic valve regurgitation is not visualized. No evidence of pulmonic  stenosis. Aorta: The aortic root is normal in size and structure. Venous: The inferior vena cava is normal in size with greater than 50% respiratory variability, suggesting right atrial pressure of 3 mmHg. IAS/Shunts: The interatrial septum appears to be lipomatous. No atrial level shunt detected by color flow Doppler.  LEFT VENTRICLE PLAX 2D LVIDd:         5.10 cm   Diastology LVIDs:         3.20 cm   LV e' medial:    12.60 cm/s LV PW:         0.90 cm  LV E/e' medial:  6.8 LV IVS:        0.90 cm   LV e' lateral:   15.40 cm/s LVOT diam:     2.20 cm   LV E/e' lateral: 5.6 LV SV:         82 LV SV Index:   39 LVOT Area:     3.80 cm  RIGHT VENTRICLE RV S prime:     15.50 cm/s TAPSE (M-mode): 2.8 cm LEFT ATRIUM             Index        RIGHT ATRIUM           Index LA diam:        3.00 cm 1.42 cm/m   RA Area:     21.30 cm LA Vol (A2C):   44.0 ml 20.83 ml/m  RA Volume:   72.80 ml  34.47 ml/m LA Vol (A4C):   41.3 ml 19.55 ml/m LA Biplane Vol: 46.5 ml 22.01 ml/m  AORTIC VALVE LVOT Vmax:   114.00 cm/s LVOT Vmean:  76.300 cm/s LVOT VTI:    0.216 m  AORTA Ao Root diam: 3.50 cm MITRAL VALVE MV Area (PHT): 3.42 cm    SHUNTS MV Decel Time: 222 msec    Systemic VTI:  0.22 m MV E velocity: 85.70 cm/s  Systemic Diam: 2.20 cm MV A velocity: 67.20 cm/s MV E/A ratio:  1.28 Fransico Him MD Electronically signed by Fransico Him MD Signature Date/Time: 07/03/2021/3:53:25 PM    Final     Scheduled Meds:  Chlorhexidine Gluconate Cloth  6 each Topical Daily   levothyroxine  50 mcg Oral Q0600   mupirocin ointment  1 application Nasal BID   Continuous Infusions:  cefTRIAXone (ROCEPHIN)  IV 2 g (07/04/21 0958)     LOS: 4 days   Time spent: 35 minutes.  Patrecia Pour, MD [facility] [email or web address] 07/04/2021, 10:05 AM

## 2021-07-04 NOTE — Anesthesia Procedure Notes (Signed)
Procedure Name: Intubation Date/Time: 07/04/2021 1:53 PM Performed by: Talbot Grumbling, CRNA Pre-anesthesia Checklist: Patient identified, Emergency Drugs available, Suction available and Patient being monitored Patient Re-evaluated:Patient Re-evaluated prior to induction Oxygen Delivery Method: Circle system utilized Preoxygenation: Pre-oxygenation with 100% oxygen Induction Type: IV induction Ventilation: Oral airway inserted - appropriate to patient size and Mask ventilation without difficulty Laryngoscope Size: Mac and 4 Grade View: Grade II Tube type: Oral Tube size: 7.5 mm Number of attempts: 1 Airway Equipment and Method: Stylet Placement Confirmation: ETT inserted through vocal cords under direct vision, positive ETCO2 and breath sounds checked- equal and bilateral Secured at: 22 cm Tube secured with: Tape Dental Injury: Teeth and Oropharynx as per pre-operative assessment

## 2021-07-05 ENCOUNTER — Ambulatory Visit: Payer: Self-pay

## 2021-07-05 ENCOUNTER — Encounter (HOSPITAL_COMMUNITY): Payer: Self-pay | Admitting: Urology

## 2021-07-05 LAB — HEMOGLOBIN AND HEMATOCRIT, BLOOD
HCT: 24.3 % — ABNORMAL LOW (ref 39.0–52.0)
HCT: 24.3 % — ABNORMAL LOW (ref 39.0–52.0)
Hemoglobin: 7.8 g/dL — ABNORMAL LOW (ref 13.0–17.0)
Hemoglobin: 7.9 g/dL — ABNORMAL LOW (ref 13.0–17.0)

## 2021-07-05 LAB — BASIC METABOLIC PANEL
Anion gap: 6 (ref 5–15)
BUN: 12 mg/dL (ref 6–20)
CO2: 24 mmol/L (ref 22–32)
Calcium: 7.7 mg/dL — ABNORMAL LOW (ref 8.9–10.3)
Chloride: 97 mmol/L — ABNORMAL LOW (ref 98–111)
Creatinine, Ser: 0.73 mg/dL (ref 0.61–1.24)
GFR, Estimated: >60 mL/min (ref >60)
Glucose, Bld: 139 mg/dL — ABNORMAL HIGH (ref 70–99)
Potassium: 4.5 mmol/L (ref 3.5–5.1)
Sodium: 127 mmol/L — ABNORMAL LOW (ref 135–145)

## 2021-07-05 LAB — SURGICAL PATHOLOGY

## 2021-07-05 LAB — SODIUM, URINE, RANDOM: Sodium, Ur: 129 mmol/L

## 2021-07-05 MED ORDER — SENNOSIDES-DOCUSATE SODIUM 8.6-50 MG PO TABS
2.0000 | ORAL_TABLET | Freq: Two times a day (BID) | ORAL | Status: DC
  Administered 2021-07-05 – 2021-07-08 (×6): 2 via ORAL
  Filled 2021-07-05 (×7): qty 2

## 2021-07-05 MED ORDER — BISACODYL 10 MG RE SUPP
10.0000 mg | Freq: Every day | RECTAL | Status: DC | PRN
  Administered 2021-07-05: 10 mg via RECTAL
  Filled 2021-07-05: qty 1

## 2021-07-05 NOTE — Progress Notes (Signed)
[facility]   Reason for visit: Follow up on bacteremia  Interval History: repeat blood culture 10/28 ngtd.  Remains afebrile.  No complaints.  Some constipation.  Day 6 ceftriaxone  Physical Exam: Constitutional:  Vitals:   07/05/21 0800 07/05/21 1426  BP: 126/66 115/79  Pulse: 61 65  Resp: 14 20  Temp: 97.6 F (36.4 C) 97.9 F (36.6 C)  SpO2: 100% 100%   patient appears in NAD Respiratory: Normal respiratory effort; CTA B Cardiovascular: RRR GI: soft, nt, nd  Review of Systems: Constitutional: negative for fevers and chills Gastrointestinal: negative for nausea and diarrhea  Lab Results  Component Value Date   WBC 11.3 (H) 07/04/2021   HGB 7.8 (L) 07/05/2021   HCT 24.3 (L) 07/05/2021   MCV 93.3 07/04/2021   PLT 346 07/04/2021    Lab Results  Component Value Date   CREATININE 0.73 07/05/2021   BUN 12 07/05/2021   NA 127 (L) 07/05/2021   K 4.5 07/05/2021   CL 97 (L) 07/05/2021   CO2 24 07/05/2021    Lab Results  Component Value Date   ALT 32 07/04/2021   AST 49 (H) 07/04/2021   ALKPHOS 74 07/04/2021     Microbiology: Recent Results (from the past 240 hour(s))  Blood culture (routine x 2)     Status: Abnormal   Collection Time: 06/30/21  5:58 PM   Specimen: Site Not Specified; Blood  Result Value Ref Range Status   Specimen Description SITE NOT SPECIFIED  Final   Special Requests   Final    BOTTLES DRAWN AEROBIC AND ANAEROBIC Blood Culture results may not be optimal due to an excessive volume of blood received in culture bottles   Culture  Setup Time   Final    GRAM POSITIVE COCCI IN CHAINS AEROBIC BOTTLE ONLY CRITICAL RESULT CALLED TO, READ BACK BY AND VERIFIED WITH: PHARMD E BREWING [id number] AT 836 AM BY CM Performed at [facility], [address], [place], [place] [address]    Culture STREPTOCOCCUS GROUP G (A)  Final   Report Status 07/03/2021 FINAL  Final   Organism ID, Bacteria STREPTOCOCCUS GROUP G  Final       Susceptibility   Streptococcus group g - MIC*    CLINDAMYCIN <=0.25 SENSITIVE Sensitive     AMPICILLIN <=0.25 SENSITIVE Sensitive     ERYTHROMYCIN <=0.12 SENSITIVE Sensitive     VANCOMYCIN 0.5 SENSITIVE Sensitive     CEFTRIAXONE <=0.12 SENSITIVE Sensitive     LEVOFLOXACIN <=0.25 SENSITIVE Sensitive     * STREPTOCOCCUS GROUP G  Blood Culture ID Panel (Reflexed)     Status: Abnormal   Collection Time: 06/30/21  5:58 PM  Result Value Ref Range Status   Enterococcus faecalis NOT DETECTED NOT DETECTED Final   Enterococcus Faecium NOT DETECTED NOT DETECTED Final   Listeria monocytogenes NOT DETECTED NOT DETECTED Final   Staphylococcus species NOT DETECTED NOT DETECTED Final   Staphylococcus aureus (BCID) NOT DETECTED NOT DETECTED Final   Staphylococcus epidermidis NOT DETECTED NOT DETECTED Final   Staphylococcus lugdunensis NOT DETECTED NOT DETECTED Final   Streptococcus species DETECTED (A) NOT DETECTED Final    Comment: Not Enterococcus species, Streptococcus agalactiae, Streptococcus pyogenes, or Streptococcus pneumoniae. CRITICAL RESULT CALLED TO, READ BACK BY AND VERIFIED WITH: PHARMD E BREWING [id number] AT 30 AM BY CM    Streptococcus agalactiae NOT DETECTED NOT DETECTED Final   Streptococcus pneumoniae NOT DETECTED NOT DETECTED Final   Streptococcus  pyogenes NOT DETECTED NOT DETECTED Final   A.calcoaceticus-baumannii NOT DETECTED NOT DETECTED Final   Bacteroides fragilis NOT DETECTED NOT DETECTED Final   Enterobacterales NOT DETECTED NOT DETECTED Final   Enterobacter cloacae complex NOT DETECTED NOT DETECTED Final   Escherichia coli NOT DETECTED NOT DETECTED Final   Klebsiella aerogenes NOT DETECTED NOT DETECTED Final   Klebsiella oxytoca NOT DETECTED NOT DETECTED Final   Klebsiella pneumoniae NOT DETECTED NOT DETECTED Final   Proteus species NOT DETECTED NOT DETECTED Final   Salmonella species NOT DETECTED NOT DETECTED Final   Serratia marcescens NOT DETECTED NOT DETECTED  Final   Haemophilus influenzae NOT DETECTED NOT DETECTED Final   Neisseria meningitidis NOT DETECTED NOT DETECTED Final   Pseudomonas aeruginosa NOT DETECTED NOT DETECTED Final   Stenotrophomonas maltophilia NOT DETECTED NOT DETECTED Final   Candida albicans NOT DETECTED NOT DETECTED Final   Candida auris NOT DETECTED NOT DETECTED Final   Candida glabrata NOT DETECTED NOT DETECTED Final   Candida krusei NOT DETECTED NOT DETECTED Final   Candida parapsilosis NOT DETECTED NOT DETECTED Final   Candida tropicalis NOT DETECTED NOT DETECTED Final   Cryptococcus neoformans/gattii NOT DETECTED NOT DETECTED Final    Comment: Performed at [facility], [address] [place], [place] [address]  Blood culture (routine x 2)     Status: None (Preliminary result)   Collection Time: 07/01/21 12:16 AM   Specimen: BLOOD RIGHT FOREARM  Result Value Ref Range Status   Specimen Description BLOOD RIGHT FOREARM  Final   Special Requests   Final    BOTTLES DRAWN AEROBIC AND ANAEROBIC Blood Culture adequate volume   Culture   Final    NO GROWTH 4 DAYS Performed at [facility], [place] [address], [place], [place] [address]    Report Status PENDING  Incomplete  Resp Panel by RT-PCR (Flu A&B, Covid) Nasopharyngeal Swab     Status: None   Collection Time: 07/01/21 12:21 AM   Specimen: Nasopharyngeal Swab; Nasopharyngeal(NP) swabs in vial transport medium  Result Value Ref Range Status   SARS Coronavirus 2 by RT PCR NEGATIVE NEGATIVE Final    Comment: (NOTE) SARS-CoV-2 target nucleic acids are NOT DETECTED.  The SARS-CoV-2 RNA is generally detectable in upper respiratory specimens during the acute phase of infection. The lowest concentration of SARS-CoV-2 viral copies this assay can detect is 138 copies/mL. A negative result does not preclude SARS-Cov-2 infection and should not be used as the sole basis for treatment or other patient management decisions. A negative result may occur with   improper specimen collection/handling, submission of specimen other than nasopharyngeal swab, presence of viral mutation(s) within the areas targeted by this assay, and inadequate number of viral copies(<138 copies/mL). A negative result must be combined with clinical observations, patient history, and epidemiological information. The expected result is Negative.  Fact Sheet for Patients:  EntrepreneurPulse.com.au  Fact Sheet for Healthcare Providers:  IncredibleEmployment.be  This test is no t yet approved or cleared by the [place] FDA and  has been authorized for detection and/or diagnosis of SARS-CoV-2 by FDA under an Emergency Use Authorization (EUA). This EUA will remain  in effect (meaning this test can be used) for the duration of the COVID-19 declaration under Section 564(b)(1) of the Act, 21 U.S.C.section 360bbb-3(b)(1), unless the authorization is terminated  or revoked sooner.       Influenza A by PCR NEGATIVE NEGATIVE Final   Influenza B by PCR NEGATIVE NEGATIVE Final  Comment: (NOTE) The Xpert Xpress SARS-CoV-2/FLU/RSV plus assay is intended as an aid in the diagnosis of influenza from Nasopharyngeal swab specimens and should not be used as a sole basis for treatment. Nasal washings and aspirates are unacceptable for Xpert Xpress SARS-CoV-2/FLU/RSV testing.  Fact Sheet for Patients: EntrepreneurPulse.com.au  Fact Sheet for Healthcare Providers: IncredibleEmployment.be  This test is not yet approved or cleared by the [place] FDA and has been authorized for detection and/or diagnosis of SARS-CoV-2 by FDA under an Emergency Use Authorization (EUA). This EUA will remain in effect (meaning this test can be used) for the duration of the COVID-19 declaration under Section 564(b)(1) of the Act, 21 U.S.C. section 360bbb-3(b)(1), unless the authorization is terminated  or revoked.  Performed at [facility], [place] [address], [place], [place] [address]   Surgical PCR screen     Status: None   Collection Time: 07/04/21  6:21 AM   Specimen: Nasal Mucosa; Nasal Swab  Result Value Ref Range Status   MRSA, PCR NEGATIVE NEGATIVE Final   Staphylococcus aureus NEGATIVE NEGATIVE Final    Comment: (NOTE) The Xpert SA Assay (FDA approved for NASAL specimens in patients 34 years of age and older), is one component of a comprehensive surveillance program. It is not intended to diagnose infection nor to guide or monitor treatment. Performed at [facility], [place] [address] [place], [place] [address]     Impression/Plan:  1. Streptococcal bacteremia - Strep group G and repeat culture ngtd.  Seems to be doing well, no new concerns from an infection standpoint.  TTE without concerns of vegetation.   Continue with ceftriaxone for 7 days total from negative blood culture through 11/4 and then stop.  Stop date placed.  Can use oral amoxicillin if he is discharged prior to that.   2. Hyponatremia - continued improvement and avoiding ampicillin for #1 to reduce fluid intake.    3.  Constipation - took some medication today.  No issues with diarrhea on ceftriaxone.    I will sign off, call with questions.

## 2021-07-05 NOTE — Plan of Care (Signed)
Arthur Mcmahon was clamped by urologist this morning and his urine has remained clear yellow, no clots. Wound vac to rt groin continues to drain serous fluid. Arthur Mcmahon reports adequate pain control today with PRN percocet. He was up to the chair with 2 assist and a RW this afternoon.  Problem: Education: Goal: Knowledge of General Education information will improve Description: Including pain rating scale, medication(s)/side effects and non-pharmacologic comfort measures Outcome: Progressing   Problem: Health Behavior/Discharge Planning: Goal: Ability to manage health-related needs will improve Outcome: Progressing   Problem: Clinical Measurements: Goal: Ability to maintain clinical measurements within normal limits will improve Outcome: Progressing Goal: Will remain free from infection Outcome: Progressing Note: IV abx Goal: Diagnostic test results will improve Outcome: Progressing Goal: Respiratory complications will improve Outcome: Progressing   Problem: Activity: Goal: Risk for activity intolerance will decrease Outcome: Progressing Note: OOB to chair w/ 2 assist and RW. PT consulted.    Problem: Nutrition: Goal: Adequate nutrition will be maintained Outcome: Progressing   Problem: Elimination: Goal: Will not experience complications related to bowel motility Outcome: Progressing Note: Constipated, sennakot added.  Goal: Will not experience complications related to urinary retention Outcome: Progressing Note: 3 way foley in place. Mcmahon clamped.    Problem: Pain Managment: Goal: General experience of comfort will improve Outcome: Progressing Note: PRN percocet w/ good effect.    Problem: Safety: Goal: Ability to remain free from injury will improve Outcome: Progressing   Problem: Skin Integrity: Goal: Risk for impaired skin integrity will decrease Outcome: Progressing

## 2021-07-05 NOTE — Progress Notes (Addendum)
PROGRESS NOTE  Arthur Mcmahon  NGE:[id number] DOB: Jan 11, 1966 DOA: 06/30/2021 PCP: Shary Key, DO   Brief Narrative: Arthur Mcmahon is a 55 y.o. male with a history of endarterectomy and fistulotomy with thrombectomy for critical right LE ischemia started on eliquis who was sent to the ED 10/27 for recurrent gross hematuria, generalized weakness, lethargy. He was found to have low grade fever, WBC 26.1k and blood culture drawn grew group G strep for which ceftriaxone has been given. AKI was noted with SCr 1.56, severe hyponatremia also noted with Na 115 for which nephrology was consulted. Hgb was 9g/dl, trended downward to 7 for which 1u PRBCs was given 10/29. TURBT is scheduled 10/31.  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 Chest CT revealed pathologic adenopathy within the left supraclavicular region, left axilla, and left para-aortic region. Borderline enlarged lymph nodes within the retrocrural region and retroperitoneum - s/p TURBT 10/31 by Dr. Abner Greenspan. Oncology has been contacted and will arrange follow up with Dr. Alen Blew at the time of discharge.  - Follow up pathology   Critical RLE ischemia and swelling: s/p revascularization by vascular surgery PTA. Compression of the right common femoral vein by adenopathy causing right lower extremity swelling/edema seen on CT. Echo ruled out LV systolic dysfunction. - Vascular surgery consulted and has followed the patient. Will continue wound vac to inguinal incision with suspected lymphatic drainage. Routine wound care recommended for lower wounds. - Elevate extremity as able  Acute blood loss anemia with ongoing gross  hematuria: s/p 1u PRBCs 10/29. - Hgb 7.8, no further bleeding currently. Trend in AM with transfusion threshold 7g/dl.  - Ok to hold anticoagulation per vascular surgery. Hematuria resolved after clot extraction and postprocedural CBI. Will defer to urology when to restart anticoagulation.  Hyponatremia: Slowly improving. 127 on 11/1. - Urine Na is rising, has evidence of increasing swelling in LLE now. Will decrease isotonic saline. Aprpeciate any other recommendations from nephrology.  - Continue fluid restriction  Group G strep bacteremia: On admission aerobic collection, not in other collections. Leukocytosis improving on ceftriaxone 2g to which susceptibility has been proven on culture.  - I appreciate ID input regarding this case. No vegetation by TTE. Repeat blood culture thus far NGTD. - Continue CTX 2g IV q24h.  Constipation: Abd is benign. - Start regular bowel regimen.   AKI: CrCl has improved significantly >38ml/min not consistent with CKD. Right hydronephrosis noted.  - Monitor BMP, avoid nephrotoxins/further contrast if possible  HLD:  - Pt not taking statin.  Tobacco use:  - Cessation counseling.  Hypothyroidism: TSH 13.445.  - Continue synthroid 15mcg daily for now, check free T4. Consider slight increase to synthroid dose vs. recheck at follow up.   RN Pressure Injury Documentation: Pressure Injury 07/01/21 Perineum Mid Stage 2 -  Partial thickness loss of dermis presenting as a shallow open injury with a red, pink wound bed without slough. wound is red with skin break down in the middle of the injury (Active)  07/01/21 2312  Location: Perineum  Location Orientation: Mid  Staging: Stage 2 -  Partial thickness loss of dermis presenting as a shallow open injury with a red, pink wound bed without slough.  Wound  Description (Comments): wound is red with skin break down in the middle of the injury  Present on Admission: Yes   DVT prophylaxis: SCDs Code Status:  Full Family Communication: None at bedside Disposition Plan:  Status is: Inpatient  Remains inpatient appropriate because: Severe hyponatremia, bacteremia, transfusion-dependent anemia w/ongoing hematuria. Will get PT  Consultants:  Urology Vascular surgery Nephrology Infectious disease  Procedures:  TURBT Dr. Abner Greenspan 0/31/2022  Antimicrobials: Ceftriaxone 2g q24h  Subjective: Had bleeding into foley but with irrigation which is now stopped no further bleeding noted. No dyspnea or chest pain. Starting to eat better over past 24 hours.   Objective: Vitals:   07/05/21 0101 07/05/21 0500 07/05/21 0523 07/05/21 0800  BP: 110/71  110/71 126/66  Pulse: 68  63 61  Resp: 16  18 14   Temp: 97.7 F (36.5 C)  97.7 F (36.5 C) 97.6 F (36.4 C)  TempSrc: Oral  Oral Oral  SpO2: 100%  100% 100%  Weight:  83.7 kg    Height:        Intake/Output Summary (Last 24 hours) at 07/05/2021 0909 Last data filed at 07/05/2021 6606 Gross per 24 hour  Intake 2981.96 ml  Output 6900 ml  Net -3918.04 ml   Filed Weights   07/01/21 1906 07/04/21 1324 07/05/21 0500  Weight: 84.8 kg 84.8 kg 83.7 kg   Gen: 54 y.o. male in no distress Pulm: Nonlabored breathing room air. Clear. CV: Regular rate and rhythm. No murmur, rub, or gallop. No JVD, slightly increased BLE edema. GI: Abdomen soft, non-tender, non-distended, with normoactive bowel sounds.  Ext: Warm, no deformities Skin: No new rashes, lesions or ulcers on visualized skin. Right inguinal wound w/vac in good positioin, continued moderate yellow output, lower right leg with dressings c/d/i Neuro: Alert and oriented. No focal neurological deficits. Psych: Judgement and insight appear fair. Mood euthymic & affect congruent. Behavior is appropriate.    Data Reviewed: I have personally reviewed following labs and imaging studies  CBC: Recent Labs  Lab 06/30/21 1758 07/01/21 0412 07/01/21 0655 07/01/21 1149 07/02/21 0246 07/02/21 1209  07/03/21 0128 07/03/21 1622 07/04/21 0453 07/04/21 1717 07/05/21 0436  WBC 26.1* 16.3* 15.6* 15.2* 11.3*  --   --   --  11.3*  --   --   NEUTROABS 24.0*  --   --   --   --   --   --   --  9.4*  --   --   HGB 9.0* 7.6* 7.3* 7.4* 7.0*   < > 8.0* 7.9* 8.1* 9.1* 7.8*  HCT 26.2* 21.3* 21.7* 21.1* 19.8*   < > 23.4* 23.2* 23.7* 27.2* 24.3*  MCV 94.2 93.8 95.2 93.4 93.4  --   --   --  93.3  --   --   PLT 498* 389 382 337 338  --   --   --  346  --   --    < > = values in this interval not displayed.   Basic Metabolic Panel: Recent Labs  Lab 07/02/21 0246 07/02/21 0609 07/03/21 0128 07/03/21 0545 07/03/21 1238 07/04/21 0453 07/04/21 0744 07/04/21 1151 07/04/21 2002 07/05/21 0436  NA  --    < > 122* 122*   < > 126* 125* 126* 126* 127*  K  --    < > 3.7 3.9  --  3.9  --   --  4.5 4.5  CL  --    < > 89* 90*  --  95*  --   --  95* 97*  CO2  --    < > 26 26  --  26  --   --  25 24  GLUCOSE  --    < > 106* 95  --  92  --   --  169* 139*  BUN  --    < > 11 10  --  8  --   --  10 12  CREATININE  --    < > 0.93 0.93  --  0.86  --   --  0.91 0.73  CALCIUM  --    < > 7.7* 7.7*  --  7.8*  --   --  7.6* 7.7*  MG 2.0  --   --   --   --   --   --   --   --   --   PHOS 2.2*  --   --   --   --   --   --   --   --   --    < > = values in this interval not displayed.   GFR: Estimated Creatinine Clearance: 122.7 mL/min (by C-G formula based on SCr of 0.73 mg/dL). Liver Function Tests: Recent Labs  Lab 06/30/21 1758 07/04/21 0453  AST 21 49*  ALT 13 32  ALKPHOS 85 74  BILITOT 1.3* 0.7  PROT 7.2 5.7*  ALBUMIN 2.4* 2.3*   No results for input(s): LIPASE, AMYLASE in the last 168 hours. No results for input(s): AMMONIA in the last 168 hours. Coagulation Profile: No results for input(s): INR, PROTIME in the last 168 hours. Cardiac Enzymes: No results for input(s): CKTOTAL, CKMB, CKMBINDEX, TROPONINI in the last 168 hours. BNP (last 3 results) No results for input(s): PROBNP in the last 8760  hours. HbA1C: No results for input(s): HGBA1C in the last 72 hours. CBG: Recent Labs  Lab 07/02/21 2154  GLUCAP 117*   Lipid Profile: No results for input(s): CHOL, HDL, LDLCALC, TRIG, CHOLHDL, LDLDIRECT in the last 72 hours. Thyroid Function Tests: No results for input(s): TSH, T4TOTAL, FREET4, T3FREE, THYROIDAB in the last 72 hours.  Anemia Panel: No results for input(s): VITAMINB12, FOLATE, FERRITIN, TIBC, IRON, RETICCTPCT in the last 72 hours. Urine analysis:    Component Value Date/Time   COLORURINE RED (A) 07/01/2021 0020   APPEARANCEUR TURBID (A) 07/01/2021 0020   LABSPEC  07/01/2021 0020    TEST NOT REPORTED DUE TO COLOR INTERFERENCE OF URINE PIGMENT   PHURINE  07/01/2021 0020    TEST NOT REPORTED DUE TO COLOR INTERFERENCE OF URINE PIGMENT   GLUCOSEU (A) 07/01/2021 0020    TEST NOT REPORTED DUE TO COLOR INTERFERENCE OF URINE PIGMENT   HGBUR (A) 07/01/2021 0020    TEST NOT REPORTED DUE TO COLOR INTERFERENCE OF URINE PIGMENT   BILIRUBINUR (A) 07/01/2021 0020    TEST NOT REPORTED DUE TO COLOR INTERFERENCE OF URINE PIGMENT   BILIRUBINUR small (A) 03/25/2021 1415   KETONESUR (A) 07/01/2021 0020    TEST NOT REPORTED DUE TO COLOR INTERFERENCE OF URINE PIGMENT   PROTEINUR (A) 07/01/2021 0020    TEST NOT REPORTED DUE TO COLOR INTERFERENCE OF URINE PIGMENT   UROBILINOGEN 1.0 03/25/2021 1415   NITRITE (A) 07/01/2021 0020    TEST NOT REPORTED DUE TO COLOR INTERFERENCE OF URINE PIGMENT   LEUKOCYTESUR (A) 07/01/2021 0020    TEST NOT REPORTED DUE TO COLOR INTERFERENCE OF URINE PIGMENT   Recent Results (from the past 240 hour(s))  Blood culture (routine  x 2)     Status: Abnormal   Collection Time: 06/30/21  5:58 PM   Specimen: Site Not Specified; Blood  Result Value Ref Range Status   Specimen Description SITE NOT SPECIFIED  Final   Special Requests   Final    BOTTLES DRAWN AEROBIC AND ANAEROBIC Blood Culture results may not be optimal due to an excessive volume of blood  received in culture bottles   Culture  Setup Time   Final    GRAM POSITIVE COCCI IN CHAINS AEROBIC BOTTLE ONLY CRITICAL RESULT CALLED TO, READ BACK BY AND VERIFIED WITH: PHARMD E BREWING [id number] AT 14 AM BY CM Performed at [facility], [place] [address] [place], [place] [address]    Culture STREPTOCOCCUS GROUP G (A)  Final   Report Status 07/03/2021 FINAL  Final   Organism ID, Bacteria STREPTOCOCCUS GROUP G  Final      Susceptibility   Streptococcus group g - MIC*    CLINDAMYCIN <=0.25 SENSITIVE Sensitive     AMPICILLIN <=0.25 SENSITIVE Sensitive     ERYTHROMYCIN <=0.12 SENSITIVE Sensitive     VANCOMYCIN 0.5 SENSITIVE Sensitive     CEFTRIAXONE <=0.12 SENSITIVE Sensitive     LEVOFLOXACIN <=0.25 SENSITIVE Sensitive     * STREPTOCOCCUS GROUP G  Blood Culture ID Panel (Reflexed)     Status: Abnormal   Collection Time: 06/30/21  5:58 PM  Result Value Ref Range Status   Enterococcus faecalis NOT DETECTED NOT DETECTED Final   Enterococcus Faecium NOT DETECTED NOT DETECTED Final   Listeria monocytogenes NOT DETECTED NOT DETECTED Final   Staphylococcus species NOT DETECTED NOT DETECTED Final   Staphylococcus aureus (BCID) NOT DETECTED NOT DETECTED Final   Staphylococcus epidermidis NOT DETECTED NOT DETECTED Final   Staphylococcus lugdunensis NOT DETECTED NOT DETECTED Final   Streptococcus species DETECTED (A) NOT DETECTED Final    Comment: Not Enterococcus species, Streptococcus agalactiae, Streptococcus pyogenes, or Streptococcus pneumoniae. CRITICAL RESULT CALLED TO, READ BACK BY AND VERIFIED WITH: PHARMD E BREWING [id number] AT 73 AM BY CM    Streptococcus agalactiae NOT DETECTED NOT DETECTED Final   Streptococcus pneumoniae NOT DETECTED NOT DETECTED Final   Streptococcus pyogenes NOT DETECTED NOT DETECTED Final   A.calcoaceticus-baumannii NOT DETECTED NOT DETECTED Final   Bacteroides fragilis NOT DETECTED NOT DETECTED Final   Enterobacterales NOT DETECTED NOT DETECTED Final    Enterobacter cloacae complex NOT DETECTED NOT DETECTED Final   Escherichia coli NOT DETECTED NOT DETECTED Final   Klebsiella aerogenes NOT DETECTED NOT DETECTED Final   Klebsiella oxytoca NOT DETECTED NOT DETECTED Final   Klebsiella pneumoniae NOT DETECTED NOT DETECTED Final   Proteus species NOT DETECTED NOT DETECTED Final   Salmonella species NOT DETECTED NOT DETECTED Final   Serratia marcescens NOT DETECTED NOT DETECTED Final   Haemophilus influenzae NOT DETECTED NOT DETECTED Final   Neisseria meningitidis NOT DETECTED NOT DETECTED Final   Pseudomonas aeruginosa NOT DETECTED NOT DETECTED Final   Stenotrophomonas maltophilia NOT DETECTED NOT DETECTED Final   Candida albicans NOT DETECTED NOT DETECTED Final   Candida auris NOT DETECTED NOT DETECTED Final   Candida glabrata NOT DETECTED NOT DETECTED Final   Candida krusei NOT DETECTED NOT DETECTED Final   Candida parapsilosis NOT DETECTED NOT DETECTED Final   Candida tropicalis NOT DETECTED NOT DETECTED Final   Cryptococcus neoformans/gattii NOT DETECTED NOT DETECTED Final    Comment: Performed at [facility], [address] [place], [place] [address]  Blood culture (routine x  2)     Status: None (Preliminary result)   Collection Time: 07/01/21 12:16 AM   Specimen: BLOOD RIGHT FOREARM  Result Value Ref Range Status   Specimen Description BLOOD RIGHT FOREARM  Final   Special Requests   Final    BOTTLES DRAWN AEROBIC AND ANAEROBIC Blood Culture adequate volume   Culture   Final    NO GROWTH 4 DAYS Performed at [facility], [place] [address] [place], [place] [address]    Report Status PENDING  Incomplete  Resp Panel by RT-PCR (Flu A&B, Covid) Nasopharyngeal Swab     Status: None   Collection Time: 07/01/21 12:21 AM   Specimen: Nasopharyngeal Swab; Nasopharyngeal(NP) swabs in vial transport medium  Result Value Ref Range Status   SARS Coronavirus 2 by RT PCR NEGATIVE NEGATIVE Final    Comment: (NOTE) SARS-CoV-2 target  nucleic acids are NOT DETECTED.  The SARS-CoV-2 RNA is generally detectable in upper respiratory specimens during the acute phase of infection. The lowest concentration of SARS-CoV-2 viral copies this assay can detect is 138 copies/mL. A negative result does not preclude SARS-Cov-2 infection and should not be used as the sole basis for treatment or other patient management decisions. A negative result may occur with  improper specimen collection/handling, submission of specimen other than nasopharyngeal swab, presence of viral mutation(s) within the areas targeted by this assay, and inadequate number of viral copies(<138 copies/mL). A negative result must be combined with clinical observations, patient history, and epidemiological information. The expected result is Negative.  Fact Sheet for Patients:  EntrepreneurPulse.com.au  Fact Sheet for Healthcare Providers:  IncredibleEmployment.be  This test is no t yet approved or cleared by the [place] FDA and  has been authorized for detection and/or diagnosis of SARS-CoV-2 by FDA under an Emergency Use Authorization (EUA). This EUA will remain  in effect (meaning this test can be used) for the duration of the COVID-19 declaration under Section 564(b)(1) of the Act, 21 U.S.C.section 360bbb-3(b)(1), unless the authorization is terminated  or revoked sooner.       Influenza A by PCR NEGATIVE NEGATIVE Final   Influenza B by PCR NEGATIVE NEGATIVE Final    Comment: (NOTE) The Xpert Xpress SARS-CoV-2/FLU/RSV plus assay is intended as an aid in the diagnosis of influenza from Nasopharyngeal swab specimens and should not be used as a sole basis for treatment. Nasal washings and aspirates are unacceptable for Xpert Xpress SARS-CoV-2/FLU/RSV testing.  Fact Sheet for Patients: EntrepreneurPulse.com.au  Fact Sheet for Healthcare  Providers: IncredibleEmployment.be  This test is not yet approved or cleared by the [place] FDA and has been authorized for detection and/or diagnosis of SARS-CoV-2 by FDA under an Emergency Use Authorization (EUA). This EUA will remain in effect (meaning this test can be used) for the duration of the COVID-19 declaration under Section 564(b)(1) of the Act, 21 U.S.C. section 360bbb-3(b)(1), unless the authorization is terminated or revoked.  Performed at [facility], [place] [address] [place], [place] [address]   Surgical PCR screen     Status: None   Collection Time: 07/04/21  6:21 AM   Specimen: Nasal Mucosa; Nasal Swab  Result Value Ref Range Status   MRSA, PCR NEGATIVE NEGATIVE Final   Staphylococcus aureus NEGATIVE NEGATIVE Final    Comment: (NOTE) The Xpert SA Assay (FDA approved for NASAL specimens in patients 60 years of age and older), is one component of a comprehensive surveillance program. It is not intended to diagnose infection nor to  guide or monitor treatment. Performed at [facility], [place] [address], [place], [place] [address]       Radiology Studies: DG C-Arm 1-60 Min-No Report  Result Date: 07/04/2021 Fluoroscopy was utilized by the requesting physician.  No radiographic interpretation.   ECHOCARDIOGRAM COMPLETE  Result Date: 07/03/2021    ECHOCARDIOGRAM REPORT   Patient Name:   Arthur Mcmahon Date of Exam: 07/03/2021 Medical Rec #:  [id number]      Height:       74.0 in Accession #:    [id number]     Weight:       187.0 lb Date of Birth:  May 05, 1966      BSA:          2.112 m Patient Age:    58 years       BP:           101/65 mmHg Patient Gender: M              HR:           79 bpm. Exam Location:  [facility] Procedure: 2D Echo, Cardiac Doppler and Color Doppler Indications:    Bacteremia  History:        Patient has no prior history of Echocardiogram examinations.                 Risk Factors:Hypertension and  Current Smoker. PAD, ETOH abuse.  Sonographer:    Alvino Chapel RCS Referring Phys: [place]  1. Left ventricular ejection fraction, by estimation, is 60 to 65%. The left ventricle has normal function. The left ventricle has no regional wall motion abnormalities. Left ventricular diastolic parameters were normal.  2. Right ventricular systolic function is normal. The right ventricular size is normal. Tricuspid regurgitation signal is inadequate for assessing PA pressure.  3. Right atrial size was mildly dilated.  4. The mitral valve is normal in structure. No evidence of mitral valve regurgitation. No evidence of mitral stenosis.  5. The aortic valve was not well visualized. Aortic valve regurgitation is not visualized. No aortic stenosis is present.  6. The inferior vena cava is normal in size with greater than 50% respiratory variability, suggesting right atrial pressure of 3 mmHg. Conclusion(s)/Recommendation(s): No evidence of valvular vegetations on this transthoracic echocardiogram. Would recommend a transesophageal echocardiogram to exclude infective endocarditis if clinically indicated. FINDINGS  Left Ventricle: Left ventricular ejection fraction, by estimation, is 60 to 65%. The left ventricle has normal function. The left ventricle has no regional wall motion abnormalities. The left ventricular internal cavity size was normal in size. There is  no left ventricular hypertrophy. Left ventricular diastolic parameters were normal. Normal left ventricular filling pressure. Right Ventricle: The right ventricular size is normal. No increase in right ventricular wall thickness. Right ventricular systolic function is normal. Tricuspid regurgitation signal is inadequate for assessing PA pressure. Left Atrium: Left atrial size was normal in size. Right Atrium: Right atrial size was mildly dilated. Pericardium: There is no evidence of pericardial effusion. Mitral Valve: The mitral valve is normal  in structure. No evidence of mitral valve regurgitation. No evidence of mitral valve stenosis. Tricuspid Valve: The tricuspid valve is normal in structure. Tricuspid valve regurgitation is not demonstrated. No evidence of tricuspid stenosis. Aortic Valve: The aortic valve was not well visualized. Aortic valve regurgitation is not visualized. No aortic stenosis is present. Pulmonic Valve: The pulmonic valve was normal in structure. Pulmonic valve regurgitation is not visualized. No  evidence of pulmonic stenosis. Aorta: The aortic root is normal in size and structure. Venous: The inferior vena cava is normal in size with greater than 50% respiratory variability, suggesting right atrial pressure of 3 mmHg. IAS/Shunts: The interatrial septum appears to be lipomatous. No atrial level shunt detected by color flow Doppler.  LEFT VENTRICLE PLAX 2D LVIDd:         5.10 cm   Diastology LVIDs:         3.20 cm   LV e' medial:    12.60 cm/s LV PW:         0.90 cm   LV E/e' medial:  6.8 LV IVS:        0.90 cm   LV e' lateral:   15.40 cm/s LVOT diam:     2.20 cm   LV E/e' lateral: 5.6 LV SV:         82 LV SV Index:   39 LVOT Area:     3.80 cm  RIGHT VENTRICLE RV S prime:     15.50 cm/s TAPSE (M-mode): 2.8 cm LEFT ATRIUM             Index        RIGHT ATRIUM           Index LA diam:        3.00 cm 1.42 cm/m   RA Area:     21.30 cm LA Vol (A2C):   44.0 ml 20.83 ml/m  RA Volume:   72.80 ml  34.47 ml/m LA Vol (A4C):   41.3 ml 19.55 ml/m LA Biplane Vol: 46.5 ml 22.01 ml/m  AORTIC VALVE LVOT Vmax:   114.00 cm/s LVOT Vmean:  76.300 cm/s LVOT VTI:    0.216 m  AORTA Ao Root diam: 3.50 cm MITRAL VALVE MV Area (PHT): 3.42 cm    SHUNTS MV Decel Time: 222 msec    Systemic VTI:  0.22 m MV E velocity: 85.70 cm/s  Systemic Diam: 2.20 cm MV A velocity: 67.20 cm/s MV E/A ratio:  1.28 Fransico Him MD Electronically signed by Fransico Him MD Signature Date/Time: 07/03/2021/3:53:25 PM    Final     Scheduled Meds:  Chlorhexidine Gluconate  Cloth  6 each Topical Daily   levothyroxine  50 mcg Oral Q0600   mupirocin ointment  1 application Nasal BID   Continuous Infusions:  sodium chloride 100 mL/hr at 07/05/21 0500   cefTRIAXone (ROCEPHIN)  IV 2 g (07/04/21 0958)   sodium chloride irrigation       LOS: 5 days   Time spent: 35 minutes.  Patrecia Pour, MD [facility] [email or web address] 07/05/2021, 9:09 AM

## 2021-07-05 NOTE — Progress Notes (Signed)
PT Cancellation Note  Patient Details Name: Arthur Mcmahon MRN: [id number] DOB: 12-25-1965   Cancelled Treatment:    Reason Eval/Treat Not Completed: Pain limiting ability to participate, states he just took medication , nursing plans to assist to recliner/BSC once meds working. Will check back tomorrow.   Claretha Cooper 07/05/2021, 2:58 PM [place] Pager [phone] Office [phone]

## 2021-07-05 NOTE — Progress Notes (Addendum)
1 Day Post-Op Subjective: Denies pain. Feeling good today. Urine has cleared to yellow.   Objective: Vital signs in last 24 hours: Temp:  [97.6 F (36.4 C)-98.3 F (36.8 C)] 97.9 F (36.6 C) (11/01 1426) Pulse Rate:  [61-68] 65 (11/01 1426) Resp:  [14-20] 20 (11/01 1426) BP: (107-126)/(66-79) 115/79 (11/01 1426) SpO2:  [100 %] 100 % (11/01 1426) Weight:  [83.7 kg] 83.7 kg (11/01 0500)  Intake/Output from previous day: 10/31 0701 - 11/01 0700 In: 2742 [I.V.:1242] Out: 6400 [Urine:6400] Intake/Output this shift: Total I/O In: 2551.4 [P.O.:1314; I.V.:1137.4; IV Piggyback:100] Out: 500 [Urine:500]  Physical Exam:  Supraclavicular left sided node, non-tender General: Alert and oriented CV: RRR Lungs: Clear Abdomen: Soft, ND, NT Ext: NT, No erythema GU: 3 way foley in place, urine is clear yellow on slow drip CBI  Lab Results: Recent Labs    07/04/21 0453 07/04/21 1717 07/05/21 0436  HGB 8.1* 9.1* 7.8*  HCT 23.7* 27.2* 24.3*   BMET Recent Labs    07/04/21 2002 07/05/21 0436  NA 126* 127*  K 4.5 4.5  CL 95* 97*  CO2 25 24  GLUCOSE 169* 139*  BUN 10 12  CREATININE 0.91 0.73  CALCIUM 7.6* 7.7*     Studies/Results: DG C-Arm 1-60 Min-No Report  Result Date: 07/04/2021 Fluoroscopy was utilized by the requesting physician.  No radiographic interpretation.    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 TURBT on 10/31. On CBI post operatively. Okay to discontinue as urine is now clear yellow  Right hydroureteronephrosis: Due to obstruction of bladder mass. Creatinine at baseline 0.7.  Right lower extremity ischemia: S/p revascularization with vascular surgery. Now with right lower extremity edema: Caused by extrinsic  compression of right common femoral vein adenopathy. Acute blood loss anemia: S/p 1u pRBC 10/29. Hgb 7.8 on 11/1. Group G strep bacteremia: No vegetation by TEE. Repeat blood cx NGTD  -Awaiting pathology results from TURBT. Have previously discussed with patient that there is no role for cystectomy.  He will benefit from systemic therapy with medical oncology.  -At this time, no need for R ureteral stent or PCN as he is asymptomatic and creatinine has normalized. However, if he redevelops AKI that would preclude chemotherapy, will consider R PCN. -VT tomorrow if urine remains clear   LOS: 5 days   Matt R[facility]  Pager: [phone]

## 2021-07-05 NOTE — Progress Notes (Signed)
Patient ID: GEE HABIG, male   DOB: [birth year], 55 y.o.   MRN: [id number] S: seen in room, Na 127 today  O:BP 115/79 (BP Location: Right Arm)   Pulse 65   Temp 97.9 F (36.6 C) (Oral)   Resp 20   Ht 6\' 2"  (1.88 m)   Wt 83.7 kg   SpO2 100%   BMI 23.69 kg/m   Intake/Output Summary (Last 24 hours) at 07/05/2021 1436 Last data filed at 07/05/2021 1400 Gross per 24 hour  Intake 4936.36 ml  Output 6900 ml  Net -1963.64 ml    Intake/Output: I/O last 3 completed shifts: In: 4221.4 [P.O.:480; I.V.:2151.4; Other:1590] Out: 8350 [Urine:8350]  Intake/Output this shift:  Total I/O In: 2194.4 [P.O.:957; I.V.:1137.4; IV Piggyback:100] Out: 500 [Urine:500] Weight change:   Exam: Pt is not in room   Assessment/Plan: Hyponatremia - Na was noted to be 115 on 06/30/21 (1.39 on 06/19/21) and improved to 121 on 07/01/21 but dropped again 4 hours later to 115, and now slowly rising to 122.  Workup revealed Sosm 243, Uosm 135, UNa <10.  Despite his lower extremity edema, the cause of hyponatremia is intravascular volume depletion. We started IV NS and Na improved to 125 yesterday. We repeated urine studies which still showed vol depletion (UNa <10). NS was continued and Na is up to 127 today. NS lowered to 50 cc/hr. Would cont NS 0.9% until Na > 130 ideally. No other recommendations, will sign off.   Bladder mass with nodal metastases - sp OR yesterday, getting bladder irrigation today Lower extremity edema - due to extrinsic compression from pelvic nodes most likely.   PAD s/p RLE thrombectomy for acute limb ischemia 10/15-10/16/22.  Seen by [facility]  Anemia - presumably combination of recent surgery and ABLA as well as possible role of malignancy  Kelly Splinter, MD 07/05/2021, 2:36 PM      Recent Labs  Lab 06/30/21 1758 07/01/21 7412 07/02/21 0246 07/02/21 8786 07/02/21 1209 07/03/21 0128 07/03/21 7672 07/03/21 1238 07/03/21 2009 07/04/21 0011 07/04/21 0947 07/04/21 0744  07/04/21 1151 07/04/21 2002 07/05/21 0436  NA 115*   < >  --  120* 121* 122* 122*   < > 125* 122* 126* 125* 126* 126* 127*  K 5.1   < >  --  3.8 4.1 3.7 3.9  --   --   --  3.9  --   --  4.5 4.5  CL 81*   < >  --  89* 88* 89* 90*  --   --   --  95*  --   --  95* 97*  CO2 21*   < >  --  23 24 26 26   --   --   --  26  --   --  25 24  GLUCOSE 91   < >  --  99 86 106* 95  --   --   --  92  --   --  169* 139*  BUN 18   < >  --  11 11 11 10   --   --   --  8  --   --  10 12  CREATININE 1.56*   < >  --  1.09 1.07 0.93 0.93  --   --   --  0.86  --   --  0.91 0.73  ALBUMIN 2.4*  --   --   --   --   --   --   --   --   --  2.3*  --   --   --   --   CALCIUM 8.5*   < >  --  7.7* 7.7* 7.7* 7.7*  --   --   --  7.8*  --   --  7.6* 7.7*  PHOS  --   --  2.2*  --   --   --   --   --   --   --   --   --   --   --   --   AST 21  --   --   --   --   --   --   --   --   --  49*  --   --   --   --   ALT 13  --   --   --   --   --   --   --   --   --  32  --   --   --   --    < > = values in this interval not displayed.    Liver Function Tests: Recent Labs  Lab 06/30/21 1758 07/04/21 0453  AST 21 49*  ALT 13 32  ALKPHOS 85 74  BILITOT 1.3* 0.7  PROT 7.2 5.7*  ALBUMIN 2.4* 2.3*    No results for input(s): LIPASE, AMYLASE in the last 168 hours. No results for input(s): AMMONIA in the last 168 hours. CBC: Recent Labs  Lab 06/30/21 1758 07/01/21 0412 07/01/21 0655 07/01/21 1149 07/02/21 0246 07/02/21 1209 07/04/21 0453 07/04/21 1717 07/05/21 0436  WBC 26.1* 16.3* 15.6* 15.2* 11.3*  --  11.3*  --   --   NEUTROABS 24.0*  --   --   --   --   --  9.4*  --   --   HGB 9.0* 7.6* 7.3* 7.4* 7.0*   < > 8.1* 9.1* 7.8*  HCT 26.2* 21.3* 21.7* 21.1* 19.8*   < > 23.7* 27.2* 24.3*  MCV 94.2 93.8 95.2 93.4 93.4  --  93.3  --   --   PLT 498* 389 382 337 338  --  346  --   --    < > = values in this interval not displayed.

## 2021-07-05 NOTE — Progress Notes (Signed)
Would recommend consulting wound care for VAC change to right groin 3 times a week to manage serous leak from groin incision.  Will need wound VAC at discharge with home health.  [name], MD [facility] Office: [place]

## 2021-07-05 NOTE — TOC Progression Note (Signed)
Transition of Care [facility]) - Progression Note    Patient Details  Name: Arthur Mcmahon MRN: [id number] Date of Birth: 07/17/1966  Transition of Care [facility]) CM/SW Contact  Leeroy Cha, RN Phone Number: 07/05/2021, 8:40 AM  Clinical Narrative:    [id number]: Operative Note   Preoperative diagnosis:  1.  Bladder tumor 2. Right hydronephrosis   Postoperative diagnosis: 1.  Bladder tumor 2. Right hydronephrosis   Procedure(s): 1.  TURBT large 2.  Left retrograde pyelogram 3. Clot evacuation of the bladder. TOC PLAN OF CARE: Following for post op progression and needs.  Expected Discharge Plan: Home/Self Care Barriers to Discharge: Continued Medical Work up  Expected Discharge Plan and Services Expected Discharge Plan: Home/Self Care                                               Social Determinants of Health (SDOH) Interventions    Readmission Risk Interventions Readmission Risk Prevention Plan 07/04/2021 06/22/2021  Post Dischage Appt - Complete  Medication Screening - Complete  Transportation Screening Complete Complete  PCP or Specialist Appt within 5-7 Days Complete -  Home Care Screening Complete -  Medication Review (RN CM) Complete -  Some recent data might be hidden

## 2021-07-06 LAB — BASIC METABOLIC PANEL
Anion gap: 6 (ref 5–15)
BUN: 7 mg/dL (ref 6–20)
CO2: 24 mmol/L (ref 22–32)
Calcium: 7.6 mg/dL — ABNORMAL LOW (ref 8.9–10.3)
Chloride: 97 mmol/L — ABNORMAL LOW (ref 98–111)
Creatinine, Ser: 0.78 mg/dL (ref 0.61–1.24)
GFR, Estimated: >60 mL/min (ref >60)
Glucose, Bld: 100 mg/dL — ABNORMAL HIGH (ref 70–99)
Potassium: 3.8 mmol/L (ref 3.5–5.1)
Sodium: 127 mmol/L — ABNORMAL LOW (ref 135–145)

## 2021-07-06 LAB — T4, FREE: Free T4: 0.78 ng/dL (ref 0.61–1.12)

## 2021-07-06 LAB — CULTURE, BLOOD (ROUTINE X 2)
Culture: NO GROWTH
Special Requests: ADEQUATE

## 2021-07-06 LAB — HEMOGLOBIN AND HEMATOCRIT, BLOOD
HCT: 24.6 % — ABNORMAL LOW (ref 39.0–52.0)
Hemoglobin: 8 g/dL — ABNORMAL LOW (ref 13.0–17.0)

## 2021-07-06 MED ORDER — NICOTINE 14 MG/24HR TD PT24
14.0000 mg | MEDICATED_PATCH | Freq: Every day | TRANSDERMAL | Status: DC
  Administered 2021-07-06 – 2021-07-08 (×3): 14 mg via TRANSDERMAL
  Filled 2021-07-06 (×3): qty 1

## 2021-07-06 MED ORDER — LACTULOSE 10 GM/15ML PO SOLN
20.0000 g | Freq: Once | ORAL | Status: AC
  Administered 2021-07-06: 20 g via ORAL
  Filled 2021-07-06: qty 30

## 2021-07-06 MED ORDER — POLYETHYLENE GLYCOL 3350 17 G PO PACK
17.0000 g | PACK | Freq: Two times a day (BID) | ORAL | Status: DC
  Administered 2021-07-06 – 2021-07-08 (×3): 17 g via ORAL
  Filled 2021-07-06 (×4): qty 1

## 2021-07-06 NOTE — Progress Notes (Addendum)
2 Days Post-Op Subjective: Had a good night. CBI has been off x 24 hours and urine is clear yellow. Overall feels stronger, wants to ambulate more independently before discharge   Objective: Vital signs in last 24 hours: Temp:  [97.9 F (36.6 C)-98.2 F (36.8 C)] 98.2 F (36.8 C) (11/02 0430) Pulse Rate:  [65-69] 69 (11/02 0430) Resp:  [17-20] 18 (11/02 0430) BP: (107-119)/(69-79) 107/74 (11/02 0430) SpO2:  [98 %-100 %] 98 % (11/02 0430)  Intake/Output from previous day: 11/01 0701 - 11/02 0700 In: 3177 [P.O.:1314; I.V.:1763; IV Piggyback:100] Out: 2300 [Urine:2300] Intake/Output this shift: No intake/output data recorded.  Physical Exam:  Supraclavicular left sided node, non-tender General: Alert and oriented CV: RRR Lungs: Clear Abdomen: Soft, ND, NT Ext: NT, No erythema GU: 3 way foley in place, CBI is clamped and urine is yellow   Lab Results: Recent Labs    07/05/21 0436 07/05/21 1654 07/06/21 0441  HGB 7.8* 7.9* 8.0*  HCT 24.3* 24.3* 24.6*   BMET Recent Labs    07/05/21 0436 07/06/21 0441  NA 127* 127*  K 4.5 3.8  CL 97* 97*  CO2 24 24  GLUCOSE 139* 100*  BUN 12 7  CREATININE 0.73 0.78  CALCIUM 7.7* 7.6*     Studies/Results: DG C-Arm 1-60 Min-No Report  Result Date: 07/04/2021 Fluoroscopy was utilized by the requesting physician.  No radiographic interpretation.    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 TURBT on 10/31. On CBI post operatively, cleared to yellow. Recommend foley removal and voiding trial today.  Right hydroureteronephrosis: Due to obstruction of bladder mass. Creatinine at baseline 0.7.  Right lower extremity ischemia: S/p revascularization with vascular surgery. Now with right lower  extremity edema: Caused by extrinsic compression of right common femoral vein adenopathy. Acute blood loss anemia: S/p 1u pRBC 10/29. Hgb stabilized Group G strep bacteremia: No vegetation by TEE. Repeat blood cx NGTD  -Awaiting pathology results from TURBT. Have previously discussed with patient that there is no role for cystectomy.  He will benefit from systemic therapy with medical oncology. They are planning to see in close outpatient follow up.  -At this time, no need for R ureteral stent or PCN as he is asymptomatic and creatinine has normalized. However, if he redevelops AKI that would preclude chemotherapy, will consider R PCN. -Okay for discharge from urologic perspective after voiding trial at discretion of primary team   [facility]  Pager: [phone]

## 2021-07-06 NOTE — Progress Notes (Signed)
Arthur Mcmahon  WHQ:[id number] DOB: 28-Dec-1965 DOA: 06/30/2021 PCP: Shary Key, DO    Brief Narrative:  55yo with a history of endarterectomy and fistulotomy with thrombectomy for critical R LE ischemia on eliquis who was sent to the ED 10/27 for recurrent gross hematuria, generalized weakness, and lethargy. He was found to have low grade fever, WBC 26.1k and blood culture grew group G Strep for which ceftriaxone was given. AKI was noted (Cr 1.56) as well as hyponatremia with Na 115 for which Nephrology was consulted. Hgb was 9g/dl, trended downward to 7 for which 1u PRBCs was given 10/29. TURBT performed 10/31.  Significant Events:  10/31 TURBT  Consultants:  Nephrology Urology Vascular Surgery ID  Code Status: FULL CODE  Antimicrobials:  Ceftriaxone 2 g  DVT prophylaxis: SCDs  Subjective: Resting comfortably in bed.  Reports that he has spontaneously urinated once status post Foley catheter removal.  Reports intake of nutrition is improving.  Still feels very weak when attempting to ambulate but is motivated to do so.  Assessment & Plan:  Bladder mass - presumed metastatic Stage IV urothelial carcinoma of the bladder 7 cm irregular right-sided bladder mass obstructing right ureter with marked right hydroureteronephrosis -CT noted pathologic adenopathy of the left supraclavicular region, left axilla, and left para-aortic region - s/p TURBT 10/31 -oncology consulted and patient to follow-up with Dr. Alen Blew as outpatient -pathology pending  Right hydroureteronephrosis Due to obstruction by bladder mass which has now been corrected  Critical right lower extremity ischemia 06/18/2021 Status post revascularization by vascular surgery prior to this admission -adenopathy causing compression of right common femoral vein leading to significant swelling of the right leg -no CHF on TTE -wound VAC in place on right inguinal wound -elevate extremity - wound VAC being arranged for home  tx   Gross hematuria -acute blood loss anemia Status post 1 unit PRBC 10/29 -transfuse as needed when hemoglobin drops below 7 -anticoagulation on hold and cleared by vascular surgery -hematuria resolved status post clot extraction  Hyponatremia Due to intravascular volume depletion - cont to gently hydrate and follow   Group G strep bacteremia No vegetation noted on TTE -repeat cultures negative thus far -antibiotics per ID service with plan for 7 days total of treatment (through 11/4)  Constipation Add medical therapy and follow -encouraged ambulation  Acute kidney injury No evidence of CKD -likely related to right hydro nephrosis in setting of bladder mass/clot -creatinine has normalized  Tobacco abuse Counseled on absolute need to discontinue tobacco abuse  Hypothyroidism TSH 13.5 -continue usual home Synthroid dose - Free T4 normal at 0.8 -will need recheck in 6-8 weeks as outpatient  Pressure Injury 07/01/21 Perineum Mid Stage 2 -  Partial thickness loss of dermis presenting as a shallow open injury with a red, pink wound bed without slough. wound is red with skin break down in the middle of the injury (Active)  07/01/21 2312 (Pressure injury was noted on arrival)  Location: Perineum  Location Orientation: Mid  Staging: Stage 2 -  Partial thickness loss of dermis presenting as a shallow open injury with a red, pink wound bed without slough.  Wound Description (Comments): wound is red with skin break down in the middle of the injury  Present on Admission: Yes    Family Communication: No family present Status is: Inpatient -hopeful for discharge home within 24 hours if home wound VAC arranged and patient moving bowels and tolerating voiding trial   Objective: Blood pressure 107/74, pulse  69, temperature 98.2 F (36.8 C), temperature source Oral, resp. rate 18, height 6\' 2"  (1.88 m), weight 83.7 kg, SpO2 98 %.  Intake/Output Summary (Last 24 hours) at 07/06/2021 0908 Last  data filed at 07/06/2021 0434 Gross per 24 hour  Intake 2937.03 ml  Output 1800 ml  Net 1137.03 ml   Filed Weights   07/01/21 1906 07/04/21 1324 07/05/21 0500  Weight: 84.8 kg 84.8 kg 83.7 kg    Examination: General: No acute respiratory distress Lungs: Clear to auscultation bilaterally without wheezes or crackles Cardiovascular: Regular rate and rhythm without murmur gallop or rub normal S1 and S2 Abdomen: Nontender, nondistended, soft, bowel sounds positive, no rebound, no ascites, no appreciable mass Extremities: 3+ edema right lower extremity with wound dressing in place  CBC: Recent Labs  Lab 06/30/21 1758 07/01/21 0412 07/01/21 1149 07/02/21 0246 07/02/21 1209 07/04/21 0453 07/04/21 1717 07/05/21 0436 07/05/21 1654 07/06/21 0441  WBC 26.1*   < > 15.2* 11.3*  --  11.3*  --   --   --   --   NEUTROABS 24.0*  --   --   --   --  9.4*  --   --   --   --   HGB 9.0*   < > 7.4* 7.0*   < > 8.1*   < > 7.8* 7.9* 8.0*  HCT 26.2*   < > 21.1* 19.8*   < > 23.7*   < > 24.3* 24.3* 24.6*  MCV 94.2   < > 93.4 93.4  --  93.3  --   --   --   --   PLT 498*   < > 337 338  --  346  --   --   --   --    < > = values in this interval not displayed.   Basic Metabolic Panel: Recent Labs  Lab 07/02/21 0246 07/02/21 0609 07/04/21 2002 07/05/21 0436 07/06/21 0441  NA  --    < > 126* 127* 127*  K  --    < > 4.5 4.5 3.8  CL  --    < > 95* 97* 97*  CO2  --    < > 25 24 24   GLUCOSE  --    < > 169* 139* 100*  BUN  --    < > 10 12 7   CREATININE  --    < > 0.91 0.73 0.78  CALCIUM  --    < > 7.6* 7.7* 7.6*  MG 2.0  --   --   --   --   PHOS 2.2*  --   --   --   --    < > = values in this interval not displayed.   GFR: Estimated Creatinine Clearance: 122.7 mL/min (by C-G formula based on SCr of 0.78 mg/dL).  Liver Function Tests: Recent Labs  Lab 06/30/21 1758 07/04/21 0453  AST 21 49*  ALT 13 32  ALKPHOS 85 74  BILITOT 1.3* 0.7  PROT 7.2 5.7*  ALBUMIN 2.4* 2.3*    CBG: Recent  Labs  Lab 07/02/21 2154  GLUCAP 117*    Recent Results (from the past 240 hour(s))  Blood culture (routine x 2)     Status: Abnormal   Collection Time: 06/30/21  5:58 PM   Specimen: Site Not Specified; Blood  Result Value Ref Range Status   Specimen Description SITE NOT SPECIFIED  Final   Special Requests   Final    BOTTLES  DRAWN AEROBIC AND ANAEROBIC Blood Culture results may not be optimal due to an excessive volume of blood received in culture bottles   Culture  Setup Time   Final    GRAM POSITIVE COCCI IN CHAINS AEROBIC BOTTLE ONLY CRITICAL RESULT CALLED TO, READ BACK BY AND VERIFIED WITH: PHARMD E BREWING [id number] AT 61 AM BY CM Performed at [facility], [place] [address] [place], [place] [address]    Culture STREPTOCOCCUS GROUP G (A)  Final   Report Status 07/03/2021 FINAL  Final   Organism ID, Bacteria STREPTOCOCCUS GROUP G  Final      Susceptibility   Streptococcus group g - MIC*    CLINDAMYCIN <=0.25 SENSITIVE Sensitive     AMPICILLIN <=0.25 SENSITIVE Sensitive     ERYTHROMYCIN <=0.12 SENSITIVE Sensitive     VANCOMYCIN 0.5 SENSITIVE Sensitive     CEFTRIAXONE <=0.12 SENSITIVE Sensitive     LEVOFLOXACIN <=0.25 SENSITIVE Sensitive     * STREPTOCOCCUS GROUP G  Blood Culture ID Panel (Reflexed)     Status: Abnormal   Collection Time: 06/30/21  5:58 PM  Result Value Ref Range Status   Enterococcus faecalis NOT DETECTED NOT DETECTED Final   Enterococcus Faecium NOT DETECTED NOT DETECTED Final   Listeria monocytogenes NOT DETECTED NOT DETECTED Final   Staphylococcus species NOT DETECTED NOT DETECTED Final   Staphylococcus aureus (BCID) NOT DETECTED NOT DETECTED Final   Staphylococcus epidermidis NOT DETECTED NOT DETECTED Final   Staphylococcus lugdunensis NOT DETECTED NOT DETECTED Final   Streptococcus species DETECTED (A) NOT DETECTED Final    Comment: Not Enterococcus species, Streptococcus agalactiae, Streptococcus pyogenes, or Streptococcus pneumoniae. CRITICAL  RESULT CALLED TO, READ BACK BY AND VERIFIED WITH: PHARMD E BREWING [id number] AT 49 AM BY CM    Streptococcus agalactiae NOT DETECTED NOT DETECTED Final   Streptococcus pneumoniae NOT DETECTED NOT DETECTED Final   Streptococcus pyogenes NOT DETECTED NOT DETECTED Final   A.calcoaceticus-baumannii NOT DETECTED NOT DETECTED Final   Bacteroides fragilis NOT DETECTED NOT DETECTED Final   Enterobacterales NOT DETECTED NOT DETECTED Final   Enterobacter cloacae complex NOT DETECTED NOT DETECTED Final   Escherichia coli NOT DETECTED NOT DETECTED Final   Klebsiella aerogenes NOT DETECTED NOT DETECTED Final   Klebsiella oxytoca NOT DETECTED NOT DETECTED Final   Klebsiella pneumoniae NOT DETECTED NOT DETECTED Final   Proteus species NOT DETECTED NOT DETECTED Final   Salmonella species NOT DETECTED NOT DETECTED Final   Serratia marcescens NOT DETECTED NOT DETECTED Final   Haemophilus influenzae NOT DETECTED NOT DETECTED Final   Neisseria meningitidis NOT DETECTED NOT DETECTED Final   Pseudomonas aeruginosa NOT DETECTED NOT DETECTED Final   Stenotrophomonas maltophilia NOT DETECTED NOT DETECTED Final   Candida albicans NOT DETECTED NOT DETECTED Final   Candida auris NOT DETECTED NOT DETECTED Final   Candida glabrata NOT DETECTED NOT DETECTED Final   Candida krusei NOT DETECTED NOT DETECTED Final   Candida parapsilosis NOT DETECTED NOT DETECTED Final   Candida tropicalis NOT DETECTED NOT DETECTED Final   Cryptococcus neoformans/gattii NOT DETECTED NOT DETECTED Final    Comment: Performed at [facility], [address] [place], [place] [address]  Blood culture (routine x 2)     Status: None   Collection Time: 07/01/21 12:16 AM   Specimen: BLOOD RIGHT FOREARM  Result Value Ref Range Status   Specimen Description BLOOD RIGHT FOREARM  Final   Special Requests   Final    BOTTLES DRAWN AEROBIC AND ANAEROBIC  Blood Culture adequate volume   Culture   Final    NO GROWTH 5 DAYS Performed at [facility], [place] [address] [place], [place] [address]    Report Status 07/06/2021 FINAL  Final  Resp Panel by RT-PCR (Flu A&B, Covid) Nasopharyngeal Swab     Status: None   Collection Time: 07/01/21 12:21 AM   Specimen: Nasopharyngeal Swab; Nasopharyngeal(NP) swabs in vial transport medium  Result Value Ref Range Status   SARS Coronavirus 2 by RT PCR NEGATIVE NEGATIVE Final    Comment: (NOTE) SARS-CoV-2 target nucleic acids are NOT DETECTED.  The SARS-CoV-2 RNA is generally detectable in upper respiratory specimens during the acute phase of infection. The lowest concentration of SARS-CoV-2 viral copies this assay can detect is 138 copies/mL. A negative result does not preclude SARS-Cov-2 infection and should not be used as the sole basis for treatment or other patient management decisions. A negative result may occur with  improper specimen collection/handling, submission of specimen other than nasopharyngeal swab, presence of viral mutation(s) within the areas targeted by this assay, and inadequate number of viral copies(<138 copies/mL). A negative result must be combined with clinical observations, patient history, and epidemiological information. The expected result is Negative.  Fact Sheet for Patients:  EntrepreneurPulse.com.au  Fact Sheet for Healthcare Providers:  IncredibleEmployment.be  This test is no t yet approved or cleared by the [place] FDA and  has been authorized for detection and/or diagnosis of SARS-CoV-2 by FDA under an Emergency Use Authorization (EUA). This EUA will remain  in effect (meaning this test can be used) for the duration of the COVID-19 declaration under Section 564(b)(1) of the Act, 21 U.S.C.section 360bbb-3(b)(1), unless the authorization is terminated  or revoked sooner.       Influenza A by PCR NEGATIVE NEGATIVE Final   Influenza B by PCR NEGATIVE NEGATIVE Final    Comment: (NOTE) The Xpert  Xpress SARS-CoV-2/FLU/RSV plus assay is intended as an aid in the diagnosis of influenza from Nasopharyngeal swab specimens and should not be used as a sole basis for treatment. Nasal washings and aspirates are unacceptable for Xpert Xpress SARS-CoV-2/FLU/RSV testing.  Fact Sheet for Patients: EntrepreneurPulse.com.au  Fact Sheet for Healthcare Providers: IncredibleEmployment.be  This test is not yet approved or cleared by the [place] FDA and has been authorized for detection and/or diagnosis of SARS-CoV-2 by FDA under an Emergency Use Authorization (EUA). This EUA will remain in effect (meaning this test can be used) for the duration of the COVID-19 declaration under Section 564(b)(1) of the Act, 21 U.S.C. section 360bbb-3(b)(1), unless the authorization is terminated or revoked.  Performed at [facility], [place] [address], [place], [place] [address]   Surgical PCR screen     Status: None   Collection Time: 07/04/21  6:21 AM   Specimen: Nasal Mucosa; Nasal Swab  Result Value Ref Range Status   MRSA, PCR NEGATIVE NEGATIVE Final   Staphylococcus aureus NEGATIVE NEGATIVE Final    Comment: (NOTE) The Xpert SA Assay (FDA approved for NASAL specimens in patients 68 years of age and older), is one component of a comprehensive surveillance program. It is not intended to diagnose infection nor to guide or monitor treatment. Performed at [facility], [place] [address] [place], [place] [address]      Scheduled Meds:  Chlorhexidine Gluconate Cloth  6 each Topical Daily   levothyroxine  50 mcg Oral Q0600   mupirocin ointment  1 application Nasal BID  senna-docusate  2 tablet Oral BID   Continuous Infusions:  sodium chloride 50 mL/hr at 07/05/21 2249   cefTRIAXone (ROCEPHIN)  IV Stopped (07/05/21 1048)   sodium chloride irrigation       LOS: 6 days   Cherene Altes, MD [facility] Office   [phone] Pager - Text Page per Amion  If 7PM-7AM, please contact night-coverage per Amion 07/06/2021, 9:08 AM

## 2021-07-06 NOTE — Consult Note (Addendum)
[facility] Nurse Consult Note: Reason for Consult: NPWT dressing right groin; lymphatic leak  Dressing was placed over incision; no protective layer between foam and incision; tissues are macerated from drainage Wound type: surgical  Pressure Injury POA: NA Measurement: 3cm x 0.1cmx 0.3cm Wound bed: closed  Drainage (amount, consistency, odor) copious serous; actively draining while trying to apply new dressing Periwound: macerated Dressing procedure/placement/frequency: Protected incision with single layer of vaseline gauze (I fenestrated) because no other non adherent layer on the floor.  Ostomy barrier ring used at the distal aspect of the incision and along the skin fold to aid in seal of NPWT dressing. 1 pc of black foam used over draining portion of the incision.  Patient tolerated well, PO pain meds given prior to dressing change.   1 VAC canister/1 small dressing kit/ 1 Mepitel ordered and requested to place into the patient's room for Friday dressing change.   [place], [place], [place]

## 2021-07-06 NOTE — Plan of Care (Signed)
Arthur Mcmahon's wound vac canister was changed today with a total of 442ml of serosanguinous fluid in the canister. WOC RN changed his right groin wound vac. Supplies at bedside for scheduled change on Friday. Arthur Mcmahon reports his pain is adequately controlled with q6 hr percocet and IV morphine for breakthrough pain. He got up to the [facility] this AM and had a medium sized hard stool. His foley was d/c in the AM and he has voided a small amount x 2. He worked with PT and was up in the chair for an hour or so this afternoon.  Problem: Education: Goal: Knowledge of General Education information will improve Description: Including pain rating scale, medication(s)/side effects and non-pharmacologic comfort measures Outcome: Progressing   Problem: Health Behavior/Discharge Planning: Goal: Ability to manage health-related needs will improve Outcome: Progressing Note: CM setting up [place].    Problem: Clinical Measurements: Goal: Ability to maintain clinical measurements within normal limits will improve Outcome: Progressing Goal: Will remain free from infection Outcome: Progressing Note: IV abx Goal: Diagnostic test results will improve Outcome: Progressing   Problem: Activity: Goal: Risk for activity intolerance will decrease Outcome: Progressing Note: PT consulted and recommend HHPT   Problem: Nutrition: Goal: Adequate nutrition will be maintained Outcome: Progressing   Problem: Elimination: Goal: Will not experience complications related to bowel motility Outcome: Progressing Goal: Will not experience complications related to urinary retention Outcome: Progressing Note: Foley d/c today and pt voiding spontaneously.    Problem: Pain Managment: Goal: General experience of comfort will improve Outcome: Progressing   Problem: Safety: Goal: Ability to remain free from injury will improve Outcome: Progressing   Problem: Skin Integrity: Goal: Risk for impaired skin integrity will decrease Outcome:  Progressing

## 2021-07-06 NOTE — Evaluation (Signed)
Physical Therapy Evaluation Patient Details Name: Arthur Mcmahon MRN: [id number] DOB: 03-Apr-1966 Today's Date: 07/06/2021  History of Present Illness  Arthur Mcmahon is a 55 y.o. with history of hypertension, tobacco abuse and peripheral vascular disease s/p recent right femoral endarterectomy and fasciotomy for critical limb ischemia who was found to have bladder mass and bulky lymphadenopathy on CT imaging. Admitted 06/30/21 from outpatient officewith  short of breath, lethargic, unable to ambulate and ill appearing.  S/P1.  TURBT large  2.  Left retrograde pyelogram  3. Clot evacuation of the bladder on 10/31  Clinical Impression  [name] receiving IV pain medication at the time PT arrived. Patient mobilized to sitting with supervision. Patient stood with min guard from bed, took a few steps to recliner, declined to ambulate any farther.  Patient  reports that the  right foot "feels like it is thick ." Notes decreased sensation.    Patient reports ambulatory at home with RW PTA, did not require assistance for ADL's. Patient  plans to return  home, has housemates PRN for assistance.  Pt admitted with above diagnosis.  Pt currently with functional limitations due to the deficits listed below (see PT Problem List). Pt will benefit from skilled PT to increase their independence and safety with mobility to allow discharge to the venue listed below.       Recommendations for follow up therapy are one component of a multi-disciplinary discharge planning process, led by the attending physician.  Recommendations may be updated based on patient status, additional functional criteria and insurance authorization.  Follow Up Recommendations Home health PT (sdepends on progress)    Assistance Recommended at Discharge PRN  Functional Status Assessment Patient has had a recent decline in their functional status and demonstrates the ability to make significant improvements in function in a reasonable and  predictable amount of time.  Equipment Recommendations  None recommended by PT    Recommendations for Other Services       Precautions / Restrictions Precautions Precautions: Fall Precaution Comments: right lower leg wound vac Restrictions RLE Weight Bearing: Weight bearing as tolerated      Mobility  Bed Mobility   Bed Mobility: Sit to Supine     Supine to sit: HOB elevated;Supervision     General bed mobility comments: Extra time but supervision for safety only.    Transfers Overall transfer level: Needs assistance Equipment used: Rolling walker (2 wheels) Transfers: Sit to/from Stand Sit to Stand: Min guard Stand pivot transfers: Supervision         General transfer comment: Extra time and cues for hand placement, min guard to transfer to stand from EOB , tokk a few steps to recliner, declined to ambulation.    Ambulation/Gait                Stairs            Wheelchair Mobility    Modified Rankin (Stroke Patients Only)       Balance Overall balance assessment: Needs assistance Sitting-balance support: No upper extremity supported Sitting balance-Leahy Scale: Good     Standing balance support: Bilateral upper extremity supported Standing balance-Leahy Scale: Poor Standing balance comment: UE support for mobility.                             Pertinent Vitals/Pain Pain Score: 7  Pain Location: R leg Pain Descriptors / Indicators: Discomfort;Grimacing;Operative site guarding Pain Intervention(s): Monitored during  session;Premedicated before session    [place] expects to be discharged to:: Private residence Living Arrangements: Non-relatives/Friends Available Help at Discharge: Friend(s);Available PRN/intermittently Type of Home: House Home Access: Stairs to enter Entrance Stairs-Rails: Psychiatric nurse of Steps: 1   Home Layout: One level Home Equipment: Grab bars -  tub/shower;Shower Land (2 wheels) Additional Comments: Pt reports 1-2 roommates one is home all the time.    Prior Function Prior Level of Function : Independent/Modified Independent             Mobility Comments: Ambulated in home with RW ADLs Comments: independent     Hand Dominance   Dominant Hand: Right    Extremity/Trunk Assessment   Upper Extremity Assessment Upper Extremity Assessment: Overall WFL for tasks assessed    Lower Extremity Assessment Lower Extremity Assessment: RLE deficits/detail RLE Deficits / Details: limited knee flexion with BVAC, RLE Sensation: decreased light touch LLE Deficits / Details: ROM WFL; MMT 5/5    Cervical / Trunk Assessment Cervical / Trunk Assessment: Normal  Communication   Communication: No difficulties  Cognition Arousal/Alertness: Awake/alert                                              General Comments      Exercises     Assessment/Plan    PT Assessment Patient needs continued PT services  PT Problem List Decreased strength;Decreased mobility;Decreased range of motion;Decreased activity tolerance;Pain;Decreased balance;Decreased knowledge of use of DME       PT Treatment Interventions DME instruction;Therapeutic activities;Modalities;Gait training;Therapeutic exercise;Patient/family education;Stair training;Balance training;Functional mobility training    PT Goals (Current goals can be found in the Care Plan section)  Acute Rehab PT Goals Patient Stated Goal: to go home PT Goal Formulation: With patient Time For Goal Achievement: 07/20/21 Potential to Achieve Goals: Good    Frequency Min 3X/week   Barriers to discharge        Co-evaluation               AM-PAC PT "6 Clicks" Mobility  Outcome Measure Help needed turning from your back to your side while in a flat bed without using bedrails?: A Little Help needed moving from lying on your back to sitting on the  side of a flat bed without using bedrails?: A Little Help needed moving to and from a bed to a chair (including a wheelchair)?: A Little Help needed standing up from a chair using your arms (e.g., wheelchair or bedside chair)?: A Little Help needed to walk in hospital room?: A Little Help needed climbing 3-5 steps with a railing? : A Lot 6 Click Score: 17    End of Session   Activity Tolerance: Patient limited by pain Patient left: in chair;with call bell/phone within reach;with chair alarm set Nurse Communication: Mobility status PT Visit Diagnosis: Other abnormalities of gait and mobility (R26.89);Muscle weakness (generalized) (M62.81);Unsteadiness on feet (R26.81);Difficulty in walking, not elsewhere classified (R26.2);Pain Pain - Right/Left: Right Pain - part of body: Leg    Time: 0277-4128 PT Time Calculation (min) (ACUTE ONLY): 12 min   Charges:   PT Evaluation $PT Eval Low Complexity: [place] PT Acute Rehabilitation Services Pager [phone] Office [phone]   Claretha Cooper 07/06/2021, 4:16 PM

## 2021-07-07 LAB — BASIC METABOLIC PANEL
Anion gap: 6 (ref 5–15)
BUN: 7 mg/dL (ref 6–20)
CO2: 24 mmol/L (ref 22–32)
Calcium: 7.5 mg/dL — ABNORMAL LOW (ref 8.9–10.3)
Chloride: 99 mmol/L (ref 98–111)
Creatinine, Ser: 0.63 mg/dL (ref 0.61–1.24)
GFR, Estimated: >60 mL/min (ref >60)
Glucose, Bld: 94 mg/dL (ref 70–99)
Potassium: 3.8 mmol/L (ref 3.5–5.1)
Sodium: 129 mmol/L — ABNORMAL LOW (ref 135–145)

## 2021-07-07 MED ORDER — LACTULOSE 10 GM/15ML PO SOLN: 30.0000 g | Freq: Two times a day (BID) | ORAL | Status: DC | PRN

## 2021-07-07 MED ORDER — BISACODYL 10 MG RE SUPP: 10.0000 mg | Freq: Every day | RECTAL | Status: DC | PRN

## 2021-07-07 NOTE — Plan of Care (Signed)
  Problem: Activity: Goal: Risk for activity intolerance will decrease Outcome: Progressing   Problem: Education: Goal: Knowledge of General Education information will improve Description: Including pain rating scale, medication(s)/side effects and non-pharmacologic comfort measures Outcome: Completed/Met

## 2021-07-07 NOTE — Progress Notes (Signed)
Arthur Mcmahon  TIR:[id number] DOB: Sep 03, 1966 DOA: 06/30/2021 PCP: Shary Key, DO    Brief Narrative:  55yo with a history of endarterectomy and fistulotomy with thrombectomy for critical R LE ischemia on eliquis who was sent to the ED 10/27 for recurrent gross hematuria, generalized weakness, and lethargy. He was found to have low grade fever, WBC 26.1k and blood culture grew group G Strep for which ceftriaxone was given. AKI was noted (Cr 1.56) as well as hyponatremia with Na 115 for which Nephrology was consulted. Hgb was 9g/dl, trended downward to 7 for which 1u PRBCs was given 10/29. TURBT performed 10/31, with patient ultimately noted to have a bladder mass with possible metastatic disease.   Significant Events:  10/31 TURBT  Consultants:  Nephrology Urology Vascular Surgery ID  Code Status: FULL CODE  Antimicrobials:  Ceftriaxone 2 g  DVT prophylaxis: SCDs  Outpatient Follow Up Issues: -recheck TSH/FT4 in 6-8 weeks   Subjective: Foley catheter removed yesterday with patient passing urine at least twice since that time.  Afebrile.  Vital signs stable.  Feeling better overall.  Still feels quite weak when attempting to ambulate but states this is improving.  Is due for his wound VAC dressing to be changed tomorrow.  Completes his course of IV antibiotics tomorrow.  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 Alen Blew as outpatient - pt has been informed of this finding   Right hydroureteronephrosis Due to obstruction by bladder mass which has now been corrected  Critical right  lower extremity ischemia 06/18/2021 Status post revascularization by Vascular Surgery prior to this admission -adenopathy causing compression of right common femoral vein leading to significant swelling of the right leg -no CHF on TTE -wound VAC in place on right inguinal wound -elevate extremity - wound VAC arranged for home tx   Gross hematuria -acute blood loss anemia Status post 1 unit PRBC 10/29 -transfuse as needed when hemoglobin drops below 7 -anticoagulation on hold and cleared by Vascular Surgery - hematuria resolved status post clot extraction  Hyponatremia Due to intravascular volume depletion -slowly improving with gentle hydration  Group G strep bacteremia No vegetation noted on TTE -repeat cultures negative after 5 days - antibiotics per ID service with plan for 7 days total of treatment (through 11/4)  Constipation Improving with medical therapy- encouraged ambulation  Acute kidney injury No evidence of CKD -likely related to right hydro nephrosis in setting of bladder mass/clot -creatinine has normalized  Tobacco abuse Counseled on absolute need to discontinue tobacco abuse  Hypothyroidism TSH 13.5 -continue usual home Synthroid dose - Free T4 normal at 0.8 -will need recheck in 6-8 weeks as outpatient  Pressure Injury 07/01/21 Perineum Mid Stage 2 -  Partial thickness loss of dermis presenting as a shallow open injury with a red, pink wound bed without slough. wound is red with skin break down in the middle of the injury (Active)  07/01/21 2312 (Pressure injury was noted on arrival)  Location: Perineum  Location Orientation: Mid  Staging: Stage 2 -  Partial thickness loss of dermis presenting as a shallow open injury with a red, pink wound bed without slough.  Wound Description (  Comments): wound is red with skin break down in the middle of the injury  Present on Admission: Yes    Family Communication: Spoke with ex-wife/support person at bedside Status is:  Inpatient -hopeful for discharge home 11/4 if home wound VAC arranged and patient moving bowels and tolerating voiding trial   Objective: Blood pressure 123/81, pulse 73, temperature 99 F (37.2 C), temperature source Oral, resp. rate 18, height 6\' 2"  (1.88 m), weight 83.7 kg, SpO2 100 %.  Intake/Output Summary (Last 24 hours) at 07/07/2021 0753 Last data filed at 07/07/2021 0520 Gross per 24 hour  Intake 2472.58 ml  Output 2955 ml  Net -482.42 ml    Filed Weights   07/01/21 1906 07/04/21 1324 07/05/21 0500  Weight: 84.8 kg 84.8 kg 83.7 kg    Examination: General: No acute respiratory distress Lungs: Clear to auscultation bilaterally without wheezing Cardiovascular: RRR without murmur Abdomen: Nontender, nondistended, soft, bowel sounds positive, no rebound, no ascites, no appreciable mass Extremities: 3+ edema right lower extremity with wound dressing in place  CBC: Recent Labs  Lab 06/30/21 1758 07/01/21 0412 07/01/21 1149 07/02/21 0246 07/02/21 1209 07/04/21 0453 07/04/21 1717 07/05/21 0436 07/05/21 1654 07/06/21 0441  WBC 26.1*   < > 15.2* 11.3*  --  11.3*  --   --   --   --   NEUTROABS 24.0*  --   --   --   --  9.4*  --   --   --   --   HGB 9.0*   < > 7.4* 7.0*   < > 8.1*   < > 7.8* 7.9* 8.0*  HCT 26.2*   < > 21.1* 19.8*   < > 23.7*   < > 24.3* 24.3* 24.6*  MCV 94.2   < > 93.4 93.4  --  93.3  --   --   --   --   PLT 498*   < > 337 338  --  346  --   --   --   --    < > = values in this interval not displayed.    Basic Metabolic Panel: Recent Labs  Lab 07/02/21 0246 07/02/21 0609 07/05/21 0436 07/06/21 0441 07/07/21 0426  NA  --    < > 127* 127* 129*  K  --    < > 4.5 3.8 3.8  CL  --    < > 97* 97* 99  CO2  --    < > 24 24 24   GLUCOSE  --    < > 139* 100* 94  BUN  --    < > 12 7 7   CREATININE  --    < > 0.73 0.78 0.63  CALCIUM  --    < > 7.7* 7.6* 7.5*  MG 2.0  --   --   --   --   PHOS 2.2*  --   --   --   --    < > = values in this interval not  displayed.    GFR: Estimated Creatinine Clearance: 122.7 mL/min (by C-G formula based on SCr of 0.63 mg/dL).  Liver Function Tests: Recent Labs  Lab 06/30/21 1758 07/04/21 0453  AST 21 49*  ALT 13 32  ALKPHOS 85 74  BILITOT 1.3* 0.7  PROT 7.2 5.7*  ALBUMIN 2.4* 2.3*     CBG: Recent Labs  Lab 07/02/21 2154  GLUCAP 117*     Recent Results (from the past 240 hour(s))  Blood  culture (routine x 2)     Status: Abnormal   Collection Time: 06/30/21  5:58 PM   Specimen: Site Not Specified; Blood  Result Value Ref Range Status   Specimen Description SITE NOT SPECIFIED  Final   Special Requests   Final    BOTTLES DRAWN AEROBIC AND ANAEROBIC Blood Culture results may not be optimal due to an excessive volume of blood received in culture bottles   Culture  Setup Time   Final    GRAM POSITIVE COCCI IN CHAINS AEROBIC BOTTLE ONLY CRITICAL RESULT CALLED TO, READ BACK BY AND VERIFIED WITH: PHARMD E BREWING [id number] AT 49 AM BY CM Performed at [facility], [place] [address], [place], [place] [address]    Culture STREPTOCOCCUS GROUP G (A)  Final   Report Status 07/03/2021 FINAL  Final   Organism ID, Bacteria STREPTOCOCCUS GROUP G  Final      Susceptibility   Streptococcus group g - MIC*    CLINDAMYCIN <=0.25 SENSITIVE Sensitive     AMPICILLIN <=0.25 SENSITIVE Sensitive     ERYTHROMYCIN <=0.12 SENSITIVE Sensitive     VANCOMYCIN 0.5 SENSITIVE Sensitive     CEFTRIAXONE <=0.12 SENSITIVE Sensitive     LEVOFLOXACIN <=0.25 SENSITIVE Sensitive     * STREPTOCOCCUS GROUP G  Blood Culture ID Panel (Reflexed)     Status: Abnormal   Collection Time: 06/30/21  5:58 PM  Result Value Ref Range Status   Enterococcus faecalis NOT DETECTED NOT DETECTED Final   Enterococcus Faecium NOT DETECTED NOT DETECTED Final   Listeria monocytogenes NOT DETECTED NOT DETECTED Final   Staphylococcus species NOT DETECTED NOT DETECTED Final   Staphylococcus aureus (BCID) NOT DETECTED NOT DETECTED Final    Staphylococcus epidermidis NOT DETECTED NOT DETECTED Final   Staphylococcus lugdunensis NOT DETECTED NOT DETECTED Final   Streptococcus species DETECTED (A) NOT DETECTED Final    Comment: Not Enterococcus species, Streptococcus agalactiae, Streptococcus pyogenes, or Streptococcus pneumoniae. CRITICAL RESULT CALLED TO, READ BACK BY AND VERIFIED WITH: PHARMD E BREWING [id number] AT 43 AM BY CM    Streptococcus agalactiae NOT DETECTED NOT DETECTED Final   Streptococcus pneumoniae NOT DETECTED NOT DETECTED Final   Streptococcus pyogenes NOT DETECTED NOT DETECTED Final   A.calcoaceticus-baumannii NOT DETECTED NOT DETECTED Final   Bacteroides fragilis NOT DETECTED NOT DETECTED Final   Enterobacterales NOT DETECTED NOT DETECTED Final   Enterobacter cloacae complex NOT DETECTED NOT DETECTED Final   Escherichia coli NOT DETECTED NOT DETECTED Final   Klebsiella aerogenes NOT DETECTED NOT DETECTED Final   Klebsiella oxytoca NOT DETECTED NOT DETECTED Final   Klebsiella pneumoniae NOT DETECTED NOT DETECTED Final   Proteus species NOT DETECTED NOT DETECTED Final   Salmonella species NOT DETECTED NOT DETECTED Final   Serratia marcescens NOT DETECTED NOT DETECTED Final   Haemophilus influenzae NOT DETECTED NOT DETECTED Final   Neisseria meningitidis NOT DETECTED NOT DETECTED Final   Pseudomonas aeruginosa NOT DETECTED NOT DETECTED Final   Stenotrophomonas maltophilia NOT DETECTED NOT DETECTED Final   Candida albicans NOT DETECTED NOT DETECTED Final   Candida auris NOT DETECTED NOT DETECTED Final   Candida glabrata NOT DETECTED NOT DETECTED Final   Candida krusei NOT DETECTED NOT DETECTED Final   Candida parapsilosis NOT DETECTED NOT DETECTED Final   Candida tropicalis NOT DETECTED NOT DETECTED Final   Cryptococcus neoformans/gattii NOT DETECTED NOT DETECTED Final    Comment: Performed at [facility], [address] [place], [place] [address]  Blood culture (routine  x 2)     Status: None    Collection Time: 07/01/21 12:16 AM   Specimen: BLOOD RIGHT FOREARM  Result Value Ref Range Status   Specimen Description BLOOD RIGHT FOREARM  Final   Special Requests   Final    BOTTLES DRAWN AEROBIC AND ANAEROBIC Blood Culture adequate volume   Culture   Final    NO GROWTH 5 DAYS Performed at [facility], [address] [place], [place] [address]    Report Status 07/06/2021 FINAL  Final  Resp Panel by RT-PCR (Flu A&B, Covid) Nasopharyngeal Swab     Status: None   Collection Time: 07/01/21 12:21 AM   Specimen: Nasopharyngeal Swab; Nasopharyngeal(NP) swabs in vial transport medium  Result Value Ref Range Status   SARS Coronavirus 2 by RT PCR NEGATIVE NEGATIVE Final    Comment: (NOTE) SARS-CoV-2 target nucleic acids are NOT DETECTED.  The SARS-CoV-2 RNA is generally detectable in upper respiratory specimens during the acute phase of infection. The lowest concentration of SARS-CoV-2 viral copies this assay can detect is 138 copies/mL. A negative result does not preclude SARS-Cov-2 infection and should not be used as the sole basis for treatment or other patient management decisions. A negative result may occur with  improper specimen collection/handling, submission of specimen other than nasopharyngeal swab, presence of viral mutation(s) within the areas targeted by this assay, and inadequate number of viral copies(<138 copies/mL). A negative result must be combined with clinical observations, patient history, and epidemiological information. The expected result is Negative.  Fact Sheet for Patients:  EntrepreneurPulse.com.au  Fact Sheet for Healthcare Providers:  IncredibleEmployment.be  This test is no t yet approved or cleared by the [place] FDA and  has been authorized for detection and/or diagnosis of SARS-CoV-2 by FDA under an Emergency Use Authorization (EUA). This EUA will remain  in effect (meaning this test can be used)  for the duration of the COVID-19 declaration under Section 564(b)(1) of the Act, 21 U.S.C.section 360bbb-3(b)(1), unless the authorization is terminated  or revoked sooner.       Influenza A by PCR NEGATIVE NEGATIVE Final   Influenza B by PCR NEGATIVE NEGATIVE Final    Comment: (NOTE) The Xpert Xpress SARS-CoV-2/FLU/RSV plus assay is intended as an aid in the diagnosis of influenza from Nasopharyngeal swab specimens and should not be used as a sole basis for treatment. Nasal washings and aspirates are unacceptable for Xpert Xpress SARS-CoV-2/FLU/RSV testing.  Fact Sheet for Patients: EntrepreneurPulse.com.au  Fact Sheet for Healthcare Providers: IncredibleEmployment.be  This test is not yet approved or cleared by the [place] FDA and has been authorized for detection and/or diagnosis of SARS-CoV-2 by FDA under an Emergency Use Authorization (EUA). This EUA will remain in effect (meaning this test can be used) for the duration of the COVID-19 declaration under Section 564(b)(1) of the Act, 21 U.S.C. section 360bbb-3(b)(1), unless the authorization is terminated or revoked.  Performed at [facility], [place] [address], [place], [place] [address]   Surgical PCR screen     Status: None   Collection Time: 07/04/21  6:21 AM   Specimen: Nasal Mucosa; Nasal Swab  Result Value Ref Range Status   MRSA, PCR NEGATIVE NEGATIVE Final   Staphylococcus aureus NEGATIVE NEGATIVE Final    Comment: (NOTE) The Xpert SA Assay (FDA approved for NASAL specimens in patients 72 years of age and older), is one component of a comprehensive surveillance program. It is not intended to diagnose infection nor to  guide or monitor treatment. Performed at [facility], [place] [address], [place], [place] [address]       Scheduled Meds:  Chlorhexidine Gluconate Cloth  6 each Topical Daily   levothyroxine  50 mcg Oral Q0600   mupirocin  ointment  1 application Nasal BID   nicotine  14 mg Transdermal Daily   polyethylene glycol  17 g Oral BID   senna-docusate  2 tablet Oral BID   Continuous Infusions:  sodium chloride 50 mL/hr at 07/06/21 1827   cefTRIAXone (ROCEPHIN)  IV Stopped (07/06/21 0949)     LOS: 7 days   Cherene Altes, MD [facility] Office  [phone] Pager - Text Page per Shea Evans  If 7PM-7AM, please contact night-coverage per Amion 07/07/2021, 7:53 AM

## 2021-07-07 NOTE — Progress Notes (Signed)
Physical Therapy Treatment Patient Details Name: Arthur Mcmahon MRN: [id number] DOB: 03-24-1966 Today's Date: 07/07/2021   History of Present Illness Arthur Mcmahon is a 55 y.o. with history of hypertension, tobacco abuse and peripheral vascular disease s/p recent right femoral endarterectomy and fasciotomy for critical limb ischemia who was found to have bladder mass and bulky lymphadenopathy on CT imaging. Admitted 06/30/21 from outpatient officewith  short of breath, lethargic, unable to ambulate and ill appearing.  S/P1.  TURBT large  2.  Left retrograde pyelogram  3. Clot evacuation of the bladder on 10/31    PT Comments    Patient expresses concern that he missed an appt. With his surgeon, "Dr. Carlis Abbott."  Patien in recliner,  Stood and tolerated shifting weight onto RLE, did not feel like  ambulating. Patient reports the RLE remains numb and heavy, states it's about the same.  Broached  the suggestion  of Short rehab  at a facility as patient has multiple other medicle issues.  Patient noncommittal/ no response about that.  Continue PT for progression and determine safe DC to home.  Recommendations for follow up therapy are one component of a multi-disciplinary discharge planning process, led by the attending physician.  Recommendations may be updated based on patient status, additional functional criteria and insurance authorization.  Follow Up Recommendations  Home health PT     Assistance Recommended at Discharge Intermittent Supervision/Assistance  Equipment Recommendations  3in1 (PT)    Recommendations for Other Services       Precautions / Restrictions Precautions Precaution Comments: right lower leg wound  vac right groin area Restrictions RLE Weight Bearing: Weight bearing as tolerated     Mobility  Bed Mobility               General bed mobility comments: in recliner.    Transfers Overall transfer level: Needs assistance Equipment used: Rolling walker (2  wheels) Transfers: Sit to/from Stand Sit to Stand: Min guard           General transfer comment: Extra time and cues for hand placement, min guard to transfer to stand from recliner.    Ambulation/Gait Ambulation/Gait assistance: Min guard   Assistive device: Rolling walker (2 wheels)   Gait velocity: decreased   General Gait Details: only tolerated placement of weight on RLE multiple times. did not feel like he can amb.   Stairs             Wheelchair Mobility    Modified Rankin (Stroke Patients Only)       Balance Overall balance assessment: Needs assistance Sitting-balance support: No upper extremity supported Sitting balance-Leahy Scale: Good     Standing balance support: Bilateral upper extremity supported Standing balance-Leahy Scale: Poor Standing balance comment: UE support for mobility.                            Cognition Arousal/Alertness: Awake/alert Behavior During Therapy: WFL for tasks assessed/performed                                            Exercises      General Comments        Pertinent Vitals/Pain Faces Pain Scale: Hurts whole lot Pain Location: R leg Pain Descriptors / Indicators: Discomfort;Grimacing;Operative site guarding;Tingling;Numbness Pain Intervention(s): Monitored during session;Premedicated before session;Limited activity within patient's  tolerance    Home Living                          Prior Function            PT Goals (current goals can now be found in the care plan section) Progress towards PT goals: Progressing toward goals    Frequency    Min 3X/week      PT Plan Current plan remains appropriate    Co-evaluation              AM-PAC PT "6 Clicks" Mobility   Outcome Measure  Help needed turning from your back to your side while in a flat bed without using bedrails?: A Little Help needed moving from lying on your back to sitting on the side  of a flat bed without using bedrails?: A Little Help needed moving to and from a bed to a chair (including a wheelchair)?: A Little Help needed standing up from a chair using your arms (e.g., wheelchair or bedside chair)?: A Little Help needed to walk in hospital room?: A Lot Help needed climbing 3-5 steps with a railing? : A Lot 6 Click Score: 16    End of Session   Activity Tolerance: Patient limited by pain Patient left: in chair;with call bell/phone within reach;with chair alarm set Nurse Communication: Mobility status PT Visit Diagnosis: Other abnormalities of gait and mobility (R26.89);Muscle weakness (generalized) (M62.81);Unsteadiness on feet (R26.81);Difficulty in walking, not elsewhere classified (R26.2);Pain Pain - Right/Left: Right Pain - part of body: Leg     Time: 1630-1650 PT Time Calculation (min) (ACUTE ONLY): 20 min  Charges:  $Therapeutic Activity: 8-22 mins                     Tresa Endo PT Acute Rehabilitation Services Pager [phone] Office [phone]    Claretha Cooper 07/07/2021, 5:16 PM

## 2021-07-07 NOTE — TOC Progression Note (Signed)
Transition of Care [facility]) - Progression Note    Patient Details  Name: Arthur Mcmahon MRN: [id number] Date of Birth: October 04, 1965  [facility] CM/SW Contact  Jasline Buskirk, Juliann Pulse, RN Phone Number: 07/07/2021, 3:31 PM  Clinical Narrative: spoke to patient about d/c plans-Approved for charity through [place] for home wound vac(has not been delivered to rm yet) -await delivery of [employer] home wound vac delivery to rm prior d/c. [facility] HHRN/PT approved for charity. Patient has own transport home.    Expected Discharge Plan: [place] Barriers to Discharge: Continued Medical Work up  Expected Discharge Plan and Services Expected Discharge Plan: [place]                         DME Arranged: Vac DME Agency: [employer] Date DME Agency Contacted: 07/07/21 Time DME Agency Contacted: [id number] Representative spoke with at DME Agency: Olivia Mackie w/[employer] [place] Arranged: RN, PT [facility] Agency: [place] ([place]) Date [place]: 07/07/21 Time [place]: 1530 Representative spoke with at [place]: [place] ([place]) Interventions    Readmission Risk Interventions Readmission Risk Prevention Plan 07/04/2021 06/22/2021  Post Dischage Appt - Complete  Medication Screening - Complete  Transportation Screening Complete Complete  PCP or Specialist Appt within 5-7 Days Complete -  Home Care Screening Complete -  Medication Review (RN CM) Complete -  Some recent data might be hidden

## 2021-07-08 LAB — CBC
HCT: 29.8 % — ABNORMAL LOW (ref 39.0–52.0)
Hemoglobin: 9.6 g/dL — ABNORMAL LOW (ref 13.0–17.0)
MCH: 30.5 pg (ref 26.0–34.0)
MCHC: 32.2 g/dL (ref 30.0–36.0)
MCV: 94.6 fL (ref 80.0–100.0)
Platelets: 298 10*3/uL (ref 150–400)
RBC: 3.15 MIL/uL — ABNORMAL LOW (ref 4.22–5.81)
RDW: 14.1 % (ref 11.5–15.5)
WBC: 11.3 10*3/uL — ABNORMAL HIGH (ref 4.0–10.5)
nRBC: 0 % (ref 0.0–0.2)

## 2021-07-08 LAB — BASIC METABOLIC PANEL
Anion gap: 8 (ref 5–15)
BUN: 7 mg/dL (ref 6–20)
CO2: 22 mmol/L (ref 22–32)
Calcium: 7.5 mg/dL — ABNORMAL LOW (ref 8.9–10.3)
Chloride: 98 mmol/L (ref 98–111)
Creatinine, Ser: 0.68 mg/dL (ref 0.61–1.24)
GFR, Estimated: >60 mL/min (ref >60)
Glucose, Bld: 79 mg/dL (ref 70–99)
Potassium: 3.9 mmol/L (ref 3.5–5.1)
Sodium: 128 mmol/L — ABNORMAL LOW (ref 135–145)

## 2021-07-08 MED ORDER — OXYCODONE HCL 5 MG PO TABS
5.0000 mg | ORAL_TABLET | Freq: Once | ORAL | Status: AC
  Administered 2021-07-08: 5 mg via ORAL
  Filled 2021-07-08: qty 1

## 2021-07-08 MED ORDER — HYDROCODONE-ACETAMINOPHEN 5-325 MG PO TABS: 1.0000 | ORAL_TABLET | Freq: Four times a day (QID) | ORAL | 0 refills | Status: DC | PRN

## 2021-07-08 MED ORDER — SENNOSIDES-DOCUSATE SODIUM 8.6-50 MG PO TABS: 2.0000 | ORAL_TABLET | Freq: Two times a day (BID) | ORAL | Status: DC

## 2021-07-08 MED ORDER — APIXABAN 5 MG PO TABS
5.0000 mg | ORAL_TABLET | Freq: Two times a day (BID) | ORAL | Status: DC
  Administered 2021-07-08: 5 mg via ORAL
  Filled 2021-07-08: qty 1

## 2021-07-08 MED ORDER — APIXABAN 5 MG PO TABS: 5.0000 mg | ORAL_TABLET | Freq: Two times a day (BID) | ORAL | Status: DC

## 2021-07-08 NOTE — Progress Notes (Signed)
[place] for Apixaban Indication: Critical limb ischemia   No Known Allergies  Patient Measurements: Height: 6\' 2"  (188 cm) Weight: 83.7 kg (184 lb 8.4 oz) IBW/kg (Calculated) : 82.2 Heparin Dosing Weight:   Vital Signs: Temp: 98.2 F (36.8 C) (11/04 0750) Temp Source: Oral (11/04 0750) BP: 118/73 (11/04 0750) Pulse Rate: 67 (11/04 0750)  Labs: Recent Labs    07/05/21 1654 07/06/21 0441 07/07/21 0426 07/08/21 0427  HGB 7.9* 8.0*  --  9.6*  HCT 24.3* 24.6*  --  29.8*  PLT  --   --   --  298  CREATININE  --  0.78 0.63 0.68    Estimated Creatinine Clearance: 122.7 mL/min (by C-G formula based on SCr of 0.68 mg/dL).   Medications:  Scheduled:   levothyroxine  50 mcg Oral Q0600   mupirocin ointment  1 application Nasal BID   nicotine  14 mg Transdermal Daily   polyethylene glycol  17 g Oral BID   senna-docusate  2 tablet Oral BID   Infusions:   sodium chloride 50 mL/hr at 07/07/21 2300   cefTRIAXone (ROCEPHIN)  IV 2 g (07/07/21 0940)    Assessment: 81 yoM admitted on 10/27 for SOB, gross hematuria, found to have bladder mass and bulky lymphadenopathy on CT imaging.  S/p TURBT 10/31.  He was on apixaban prior to admission for recent right femoral endarterectomy and fasciotomy for critical limb ischemia.  PTA apixaban dose 10mg  PO BID (starter pack).  LD on 10/27, but pt reports he is unsure of how many doses are left in the starter pack and may have missed a few doses.    Today, 07/08/2021 SCr low/stable ~ 0.7 CBC:  Hgb improved to 9.6, Plt 298 No bleeding or complications reported.  Hematuria resolved.   Goal of Therapy:  Monitor platelets by anticoagulation protocol: Yes   Plan:  Apixaban 5mg  BID - Discussed bleeding vs clot risk with Dr. Thereasa Solo.  Plan to OMIT the remainder of the loading dose phase due to ongoing risk for urological bleeding.   Continue to monitor for s/s bleeding or clot. Pharmacy to provide  education prior to discharge.    Gretta Arab PharmD, BCPS Clinical Pharmacist [facility] main pharmacy [phone] 07/08/2021 8:40 AM

## 2021-07-08 NOTE — Consult Note (Signed)
[place] Nurse wound follow up Wound type:N{WT to right groin lymphatic fluid leak Measurement: 0.3cm round opening of undetermined depth, 0.4cm x 3cm x 0.1cm healing partial thickness kin injury due to [place], maceration Wound bed: opening, no wound bed visible Drainage (amount, consistency, odor) moderate to large amount lymphatic fluid Periwound: intact, resolving maceration Dressing procedure/placement/frequency: One piece of black granufoam removed with skin barrier ring and white petrolatum gauze dressing using medical adhesive remover spray. Area cleansed with NS and dried. White petrolatum gauze placed over MARSI area, skin barrier ring placed around opening, one piece of black foam placed over wound and ring. Drape applied and dressing attached to 167mmHg continuous negative pressure. An immediate seal is achieved.  Patient tolerated procedure well.  Instructed to wear boxer-briefs of mesh briefs home.   Instructed to charge batter of home VAC device whenever sitting or at night while sleeping. Patient will transport VAC supplies home for [facility] to use.  [place] nursing team will follow and see if still in house on Monday, and will remain available to this patient, the nursing and medical teams.   Thanks, Maudie Flakes, MSN, RN, [place], Arther Abbott  Pager# [phone]

## 2021-07-08 NOTE — Progress Notes (Signed)
Reviewed all discharge instructions with pt and caregiver including medications, appointments and use of negative pressure wound therapy at home.  All questions answered. Pt and caregiver verbalized understanding. Nekisha Mcdiarmid, Laurel Dimmer, RN

## 2021-07-08 NOTE — Discharge Summary (Signed)
DISCHARGE SUMMARY  Arthur Mcmahon  MR#: [id number]  DOB:11-Jan-1966  Date of Admission: 06/30/2021 Date of Discharge: 07/08/2021  Attending Physician:Aristea Posada Hennie Duos, MD  Patient's CHY:IFOYD, Weldon Picking, DO  Consults: Nephrology Urology Vascular Surgery ID  Disposition: D/C home    Follow-up Appts:  Follow-up Information     [facility]. Schedule an appointment as soon as possible for a visit.   Why: F/u in resident clinic as scheduled Contact information: [place] Kerr, Toast, DO Follow up.   Specialty: Family Medicine Contact information: [place] [address] [phone]         Wyatt Portela, MD Follow up.   Specialty: Oncology Why: The office should contact you with an appointment. If you have not heard from them by 11/8 please call the office. 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 He was found to have low grade fever, WBC 26.1k and blood culture grew group G  Strep for which ceftriaxone was given. AKI was noted (Cr 1.56) as well as hyponatremia with Na 115 for which Nephrology was consulted. Hgb was 9g/dl, trended downward to 7 for which 1u PRBCs was given 10/29. TURBT performed 10/31, with patient ultimately noted to have a bladder mass with possible metastatic disease.   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 Alen Blew as outpatient, as well as Urology - pt has been informed of this finding    Right hydroureteronephrosis Due to obstruction by bladder mass which has now been corrected   Critical right lower extremity ischemia 06/18/2021 Status post revascularization by Vascular Surgery prior to this admission -adenopathy causing compression of right common femoral vein leading to significant swelling of the right leg -no CHF on TTE -wound VAC in place on right inguinal wound -elevate extremity - wound VAC arranged for home tx    Gross hematuria -acute blood loss anemia Status post 1 unit PRBC 10/29 - anticoagulation held for majority of admission, but resumed on day of d/c as hematuria resolved   Hyponatremia Due to intravascular volume depletion -slowly improved with gentle hydration   Group G Strep bacteremia No vegetation noted on TTE -repeat cultures negative after 5 days - antibiotics per ID service with 7 days total  of treatment (through 11/4) completed prior to d/c    Constipation Improved with medical therapy- encouraged ambulation   Acute kidney injury No evidence of CKD -likely related to right hydro nephrosis in setting of bladder mass/clot  -creatinine has normalized   Tobacco abuse Counseled on absolute need to discontinue tobacco abuse   Hypothyroidism TSH 13.5 - Free T4 normal at 0.8 - will need recheck in 6-8 weeks as outpatient - do not feel ongoing/chronic synthroid dosing clearly indicated at this time as TSH obtained during acute illness not reliable   Pressure Injury 07/01/21 Perineum Mid Stage 2 -  Partial thickness loss of dermis presenting as a shallow open injury with a red, pink wound bed without slough. wound is red with skin break down in the middle of the injury (Active)  07/01/21 2312 (Pressure injury was noted on arrival)  Location: Perineum  Location Orientation: Mid  Staging: Stage 2 -  Partial thickness loss of dermis presenting as a shallow open injury with a red, pink wound bed without slough.  Wound Description (Comments): wound is red with skin break down in the middle of the injury  Present on Admission: Yes     Allergies as of 07/08/2021   No Known Allergies      Medication List     STOP taking these medications    aspirin EC 81 MG tablet   atorvastatin 40 MG tablet Commonly known as: Lipitor   cephALEXin 500 MG capsule Commonly known as: [place] these medications    acetaminophen 500 MG tablet Commonly known as: TYLENOL Take 500 mg by mouth every 6 (six) hours as needed for mild pain, fever or headache.   apixaban 5 MG Tabs tablet Commonly known as: ELIQUIS Take 1 tablet (5 mg total) by mouth 2 (two) times daily. What changed: Another medication with the same name was removed. Continue taking this medication, and follow the directions you see here.   HYDROcodone-acetaminophen 5-325 MG tablet Commonly known as: Norco Take 1 tablet by mouth every 6 (six) hours as needed for moderate pain.   multivitamin with minerals Tabs tablet Take 1 tablet by mouth daily.   senna-docusate 8.6-50 MG tablet Commonly known as: Senokot-S Take 2 tablets by mouth 2 (two) times  daily.               Durable Medical Equipment  (From admission, onward)           Start     Ordered   07/08/21 0831  For home use only DME 3 n 1  Once        07/08/21 0830            Day of Discharge BP 119/65 (BP Location: Left Arm)   Pulse 72   Temp 98.8 F (37.1 C) (Oral)   Resp 18   Ht 6\' 2"  (1.88 m)   Wt 83.7 kg   SpO2 99%   BMI 23.69 kg/m   Physical Exam: General: No acute respiratory distress Lungs: Clear to auscultation bilaterally without wheezes or crackles Cardiovascular: Regular rate and rhythm without murmur gallop or rub normal S1 and S2 Abdomen: Nontender, nondistended, soft, bowel sounds positive, no rebound, no ascites, no appreciable mass Extremities: No significant cyanosis, clubbing, or edema bilateral lower extremities  Basic Metabolic Panel: Recent Labs  Lab 07/02/21 0246 07/02/21 0609 07/04/21 2002 07/05/21 0436 07/06/21 0441 07/07/21 0426 07/08/21 0427  NA  --    < >  126* 127* 127* 129* 128*  K  --    < > 4.5 4.5 3.8 3.8 3.9  CL  --    < > 95* 97* 97* 99 98  CO2  --    < > 25 24 24 24 22   GLUCOSE  --    < > 169* 139* 100* 94 79  BUN  --    < > 10 12 7 7 7   CREATININE  --    < > 0.91 0.73 0.78 0.63 0.68  CALCIUM  --    < > 7.6* 7.7* 7.6* 7.5* 7.5*  MG 2.0  --   --   --   --   --   --   PHOS 2.2*  --   --   --   --   --   --    < > = values in this interval not displayed.    Liver Function Tests: Recent Labs  Lab 07/04/21 0453  AST 49*  ALT 32  ALKPHOS 74  BILITOT 0.7  PROT 5.7*  ALBUMIN 2.3*     CBC: Recent Labs  Lab 07/02/21 0246 07/02/21 1209 07/04/21 0453 07/04/21 1717 07/05/21 0436 07/05/21 1654 07/06/21 0441 07/08/21 0427  WBC 11.3*  --  11.3*  --   --   --   --  11.3*  NEUTROABS  --   --  9.4*  --   --   --   --   --   HGB 7.0*   < > 8.1* 9.1* 7.8* 7.9* 8.0* 9.6*  HCT 19.8*   < > 23.7* 27.2* 24.3* 24.3* 24.6* 29.8*  MCV 93.4  --  93.3  --   --   --   --  94.6  PLT 338  --  346  --   --   --    --  298   < > = values in this interval not displayed.    CBG: Recent Labs  Lab 07/02/21 2154  GLUCAP 117*    Recent Results (from the past 240 hour(s))  Blood culture (routine x 2)     Status: Abnormal   Collection Time: 06/30/21  5:58 PM   Specimen: Site Not Specified; Blood  Result Value Ref Range Status   Specimen Description SITE NOT SPECIFIED  Final   Special Requests   Final    BOTTLES DRAWN AEROBIC AND ANAEROBIC Blood Culture results may not be optimal due to an excessive volume of blood received in culture bottles   Culture  Setup Time   Final    GRAM POSITIVE COCCI IN CHAINS AEROBIC BOTTLE ONLY CRITICAL RESULT CALLED TO, READ BACK BY AND VERIFIED WITH: PHARMD E BREWING [id number] AT 11 AM BY CM Performed at [facility], [place] [address], [place], [place] [address]    Culture STREPTOCOCCUS GROUP G (A)  Final   Report Status 07/03/2021 FINAL  Final   Organism ID, Bacteria STREPTOCOCCUS GROUP G  Final      Susceptibility   Streptococcus group g - MIC*    CLINDAMYCIN <=0.25 SENSITIVE Sensitive     AMPICILLIN <=0.25 SENSITIVE Sensitive     ERYTHROMYCIN <=0.12 SENSITIVE Sensitive     VANCOMYCIN 0.5 SENSITIVE Sensitive     CEFTRIAXONE <=0.12 SENSITIVE Sensitive     LEVOFLOXACIN <=0.25 SENSITIVE Sensitive     * STREPTOCOCCUS GROUP G  Blood Culture ID Panel (Reflexed)     Status: Abnormal   Collection Time: 06/30/21  5:58 PM  Result Value Ref Range Status   Enterococcus faecalis NOT DETECTED NOT DETECTED Final   Enterococcus Faecium NOT DETECTED NOT DETECTED Final   Listeria monocytogenes NOT DETECTED NOT DETECTED Final   Staphylococcus species NOT DETECTED NOT DETECTED Final   Staphylococcus aureus (BCID) NOT DETECTED NOT DETECTED Final   Staphylococcus epidermidis NOT DETECTED NOT DETECTED Final   Staphylococcus lugdunensis NOT DETECTED NOT DETECTED Final   Streptococcus species DETECTED (A) NOT DETECTED Final    Comment: Not Enterococcus species, Streptococcus  agalactiae, Streptococcus pyogenes, or Streptococcus pneumoniae. CRITICAL RESULT CALLED TO, READ BACK BY AND VERIFIED WITH: PHARMD E BREWING [id number] AT 33 AM BY CM    Streptococcus agalactiae NOT DETECTED NOT DETECTED Final   Streptococcus pneumoniae NOT DETECTED NOT DETECTED Final   Streptococcus pyogenes NOT DETECTED NOT DETECTED Final   A.calcoaceticus-baumannii NOT DETECTED NOT DETECTED Final   Bacteroides fragilis NOT DETECTED NOT DETECTED Final   Enterobacterales NOT DETECTED NOT DETECTED Final   Enterobacter cloacae complex NOT DETECTED NOT DETECTED Final   Escherichia coli NOT DETECTED NOT DETECTED Final   Klebsiella aerogenes NOT DETECTED NOT DETECTED Final   Klebsiella oxytoca NOT DETECTED NOT DETECTED Final   Klebsiella pneumoniae NOT DETECTED NOT DETECTED Final   Proteus species NOT DETECTED NOT DETECTED Final   Salmonella species NOT DETECTED NOT DETECTED Final   Serratia marcescens NOT DETECTED NOT DETECTED Final   Haemophilus influenzae NOT DETECTED NOT DETECTED Final   Neisseria meningitidis NOT DETECTED NOT DETECTED Final   Pseudomonas aeruginosa NOT DETECTED NOT DETECTED Final   Stenotrophomonas maltophilia NOT DETECTED NOT DETECTED Final   Candida albicans NOT DETECTED NOT DETECTED Final   Candida auris NOT DETECTED NOT DETECTED Final   Candida glabrata NOT DETECTED NOT DETECTED Final   Candida krusei NOT DETECTED NOT DETECTED Final   Candida parapsilosis NOT DETECTED NOT DETECTED Final   Candida tropicalis NOT DETECTED NOT DETECTED Final   Cryptococcus neoformans/gattii NOT DETECTED NOT DETECTED Final    Comment: Performed at [facility], [address] [place], [place] [address]  Blood culture (routine x 2)     Status: None   Collection Time: 07/01/21 12:16 AM   Specimen: BLOOD RIGHT FOREARM  Result Value Ref Range Status   Specimen Description BLOOD RIGHT FOREARM  Final   Special Requests   Final    BOTTLES DRAWN AEROBIC AND ANAEROBIC Blood Culture  adequate volume   Culture   Final    NO GROWTH 5 DAYS Performed at [facility], [place] [address] [place], [place] [address]    Report Status 07/06/2021 FINAL  Final  Resp Panel by RT-PCR (Flu A&B, Covid) Nasopharyngeal Swab     Status: None   Collection Time: 07/01/21 12:21 AM   Specimen: Nasopharyngeal Swab; Nasopharyngeal(NP) swabs in vial transport medium  Result Value Ref Range Status   SARS Coronavirus 2 by RT PCR NEGATIVE NEGATIVE Final    Comment: (NOTE) SARS-CoV-2 target nucleic acids are NOT DETECTED.  The SARS-CoV-2 RNA is generally detectable in upper respiratory specimens during the acute phase of infection. The lowest concentration of SARS-CoV-2 viral copies this assay can detect is 138 copies/mL. A negative result does not preclude SARS-Cov-2 infection and should not be used as the sole basis for treatment or other patient management decisions. A negative result may occur with  improper specimen collection/handling, submission of specimen other than nasopharyngeal swab, presence of viral mutation(s) within the areas targeted by this assay, and inadequate number of viral  copies(<138 copies/mL). A negative result must be combined with clinical observations, patient history, and epidemiological information. The expected result is Negative.  Fact Sheet for Patients:  EntrepreneurPulse.com.au  Fact Sheet for Healthcare Providers:  IncredibleEmployment.be  This test is no t yet approved or cleared by the [place] FDA and  has been authorized for detection and/or diagnosis of SARS-CoV-2 by FDA under an Emergency Use Authorization (EUA). This EUA will remain  in effect (meaning this test can be used) for the duration of the COVID-19 declaration under Section 564(b)(1) of the Act, 21 U.S.C.section 360bbb-3(b)(1), unless the authorization is terminated  or revoked sooner.       Influenza A by PCR NEGATIVE NEGATIVE Final    Influenza B by PCR NEGATIVE NEGATIVE Final    Comment: (NOTE) The Xpert Xpress SARS-CoV-2/FLU/RSV plus assay is intended as an aid in the diagnosis of influenza from Nasopharyngeal swab specimens and should not be used as a sole basis for treatment. Nasal washings and aspirates are unacceptable for Xpert Xpress SARS-CoV-2/FLU/RSV testing.  Fact Sheet for Patients: EntrepreneurPulse.com.au  Fact Sheet for Healthcare Providers: IncredibleEmployment.be  This test is not yet approved or cleared by the [place] FDA and has been authorized for detection and/or diagnosis of SARS-CoV-2 by FDA under an Emergency Use Authorization (EUA). This EUA will remain in effect (meaning this test can be used) for the duration of the COVID-19 declaration under Section 564(b)(1) of the Act, 21 U.S.C. section 360bbb-3(b)(1), unless the authorization is terminated or revoked.  Performed at [facility], [place] [address], [place], [place] [address]   Surgical PCR screen     Status: None   Collection Time: 07/04/21  6:21 AM   Specimen: Nasal Mucosa; Nasal Swab  Result Value Ref Range Status   MRSA, PCR NEGATIVE NEGATIVE Final   Staphylococcus aureus NEGATIVE NEGATIVE Final    Comment: (NOTE) The Xpert SA Assay (FDA approved for NASAL specimens in patients 39 years of age and older), is one component of a comprehensive surveillance program. It is not intended to diagnose infection nor to guide or monitor treatment. Performed at [facility], [place] [address] [place], [place] [address]       Time spent in discharge (includes decision making & examination of pt): 35 minutes  07/08/2021, 3:08 PM   Cherene Altes, MD [facility] Office  [phone]

## 2021-07-08 NOTE — Plan of Care (Signed)
  Problem: Health Behavior/Discharge Planning: Goal: Ability to manage health-related needs will improve Outcome: Adequate for Discharge   Problem: Activity: Goal: Risk for activity intolerance will decrease Outcome: Adequate for Discharge   Problem: Nutrition: Goal: Adequate nutrition will be maintained Outcome: Adequate for Discharge   Problem: Pain Managment: Goal: General experience of comfort will improve Outcome: Adequate for Discharge   Problem: Safety: Goal: Ability to remain free from injury will improve Outcome: Adequate for Discharge   Problem: Skin Integrity: Goal: Risk for impaired skin integrity will decrease Outcome: Adequate for Discharge

## 2021-07-08 NOTE — TOC Transition Note (Signed)
Transition of Care [facility]) - CM/SW Discharge Note   Patient Details  Name: Arthur Mcmahon MRN: [id number] Date of Birth: 06/02/1966  Transition of Care [facility]) CM/SW Contact:  Leeroy Cha, RN Phone Number: 07/08/2021, 2:04 PM   Clinical Narrative:    Pt ready for dc, wound vac and cannisters in the room.  [facility] set up to do hhc,  [facility] for the kwound vac.  Final next level of care: Tensed Barriers to Discharge: Continued Medical Work up   Patient Goals and CMS Choice        Discharge Placement                       Discharge Plan and Services                DME Arranged: Vac DME Agency: [facility] Date DME Agency Contacted: 07/07/21 Time DME Agency Contacted: 3532 Representative spoke with at DME Agency: Olivia Mackie w/[facility] [place] Arranged: RN, PT [facility] Agency: [place] ([place]) Date [place]: 07/07/21 Time [place]: 1530 Representative spoke with at [place]: [place] ([place]) Interventions     Readmission Risk Interventions Readmission Risk Prevention Plan 07/04/2021 06/22/2021  Post Dischage Appt - Complete  Medication Screening - Complete  Transportation Screening Complete Complete  PCP or Specialist Appt within 5-7 Days Complete -  Home Care Screening Complete -  Medication Review (RN CM) Complete -  Some recent data might be hidden

## 2021-07-08 NOTE — Progress Notes (Signed)
4 Days Post-Op Subjective:  Denies pain. Voiding clear yellow urine.  Objective: Vital signs in last 24 hours: Temp:  [98.2 F (36.8 C)-99.1 F (37.3 C)] 98.8 F (37.1 C) (11/04 1246) Pulse Rate:  [67-77] 72 (11/04 1246) Resp:  [16-18] 18 (11/04 1246) BP: (118-124)/(65-74) 119/65 (11/04 1246) SpO2:  [99 %-100 %] 99 % (11/04 1246)  Intake/Output from previous day: 11/03 0701 - 11/04 0700 In: 1579.5 [P.O.:840; I.V.:639.5; IV Piggyback:100] Out: 2200 [Urine:1700; Drains:500] Intake/Output this shift: Total I/O In: -  Out: 300 [Urine:300]  Physical Exam:  General: Alert and oriented CV: RRR Lungs: Clear Abdomen: Soft, ND, NT Ext: NT, No erythema  Lab Results: Recent Labs    07/05/21 1654 07/06/21 0441 07/08/21 0427  HGB 7.9* 8.0* 9.6*  HCT 24.3* 24.6* 29.8*   BMET Recent Labs    07/07/21 0426 07/08/21 0427  NA 129* 128*  K 3.8 3.9  CL 99 98  CO2 24 22  GLUCOSE 94 79  BUN 7 7  CREATININE 0.63 0.68  CALCIUM 7.5* 7.5*     Studies/Results: No results found.  Assessment/Plan: Metastatic Stage IVA [id number] urothelial carcinoma of the bladder, (right hydroureteronephrosis) with widely metastatic lymphadenopathy (extensive retroperitoneal, pelvic, left supraclavicular, left axilla, left para-aortic, retrocrural adenopathy). TURBT on 10/31. Right hydroureteronephrosis: Due to obstruction of bladder mass. Creatinine at baseline 0.7.  Right lower extremity ischemia: S/p revascularization with vascular surgery. Now with right lower extremity edema: Caused by extrinsic compression of right common femoral vein adenopathy. Acute blood loss anemia: S/p 1u pRBC 10/29. Hgb stabilized Group G strep bacteremia: No vegetation by TEE. Repeat blood cx NGTD  I reviewed pathology with patient, pasted below. Arranged f/u outpatient in resident clinic. He will followup with med/onc to discuss chemotherapy. A. 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 Isrrael Fluckiger MD 07/08/2021, 1:31 PM [facility]  Pager: [phone]

## 2021-07-08 NOTE — Progress Notes (Signed)
Physical Therapy Treatment Patient Details Name: Arthur Mcmahon MRN: [id number] DOB: 03/21/1966 Today's Date: 07/08/2021   History of Present Illness Arthur Mcmahon is a 55 y.o. with history of hypertension, tobacco abuse and peripheral vascular disease s/p recent right femoral endarterectomy and fasciotomy for critical limb ischemia who was found to have bladder mass and bulky lymphadenopathy on CT imaging. Admitted 06/30/21 from outpatient officewith  short of breath, lethargic, unable to ambulate and ill appearing.  S/P1.  TURBT large  2.  Left retrograde pyelogram  3. Clot evacuation of the bladder on 10/31    PT Comments    Pt was premedicated for pain.  Assisted OOB pt did "better than I thought" stated pt.  General bed mobility comments: self able to transfer to EOB with increased time and effort to move R LE.  General transfer comment: increased time but self able to rise from regular height bed and lower height toilet.  Good safety cognition and use of hands to steady self. General Gait Details: pt self able to amb to and from bathroom with walker and tolerated PWB thru R LE.  Decreased Right step length but functional enough to get around his hoem.  Pt does have a wheelchair for longer distances.  Going home with wound VAC.   Recommendations for follow up therapy are one component of a multi-disciplinary discharge planning process, led by the attending physician.  Recommendations may be updated based on patient status, additional functional criteria and insurance authorization.  Follow Up Recommendations  Home health PT     Assistance Recommended at Discharge    Equipment Recommendations  3in1 (PT) (sent a secure chat to [facility], has yet to be delivered)    Recommendations for Other Services       Precautions / Restrictions Precautions Precautions: Fall Precaution Comments: right lower leg wound  vac right groin area Restrictions Weight Bearing Restrictions: No RLE Weight Bearing:  Weight bearing as tolerated     Mobility  Bed Mobility Overal bed mobility: Needs Assistance Bed Mobility: Supine to Sit     Supine to sit: HOB elevated;Supervision     General bed mobility comments: self able to transfer to EOB with increased time and effort to move R LE    Transfers Overall transfer level: Needs assistance Equipment used: Rolling walker (2 wheels) Transfers: Sit to/from [employer] Transfers Sit to Stand: Supervision Stand pivot transfers: Supervision         General transfer comment: increased time but self able to rise from regular height bed and lower height toilet.  Good safety cognition and use of hands to steady self.    Ambulation/Gait Ambulation/Gait assistance: Supervision Gait Distance (Feet): 18 Feet (9 feet x 2 to and from bathroom) Assistive device: Rolling walker (2 wheels) Gait Pattern/deviations: Step-to pattern;Decreased stride length;Decreased weight shift to right;Antalgic;Decreased dorsiflexion - right Gait velocity: decreased   General Gait Details: pt self able to amb to and from bathroom with walker and tolerated PWB thru R LE.  Decreased Right step length but functional enough to get around his hoem.  Pt does have a wheelchair for longer distances.  Going home with wound VAC.   Stairs             Wheelchair Mobility    Modified Rankin (Stroke Patients Only)       Balance  Cognition Arousal/Alertness: Awake/alert Behavior During Therapy: WFL for tasks assessed/performed Overall Cognitive Status: Within Functional Limits for tasks assessed                                 General Comments: AxO x 3 very pleasant        Exercises      General Comments        Pertinent Vitals/Pain Pain Assessment: 0-10 Pain Score: 8  Pain Location: R leg Pain Descriptors / Indicators: Discomfort;Grimacing;Operative site  guarding;Tingling;Numbness Pain Intervention(s): Monitored during session;Premedicated before session;Repositioned    Home Living                          Prior Function            PT Goals (current goals can now be found in the care plan section) Progress towards PT goals: Progressing toward goals    Frequency    Min 3X/week      PT Plan Current plan remains appropriate    Co-evaluation              AM-PAC PT "6 Clicks" Mobility   Outcome Measure  Help needed turning from your back to your side while in a flat bed without using bedrails?: A Little Help needed moving from lying on your back to sitting on the side of a flat bed without using bedrails?: A Little Help needed moving to and from a bed to a chair (including a wheelchair)?: A Little Help needed standing up from a chair using your arms (e.g., wheelchair or bedside chair)?: A Little Help needed to walk in hospital room?: A Little Help needed climbing 3-5 steps with a railing? : A Little 6 Click Score: 18    End of Session Equipment Utilized During Treatment: Gait belt Activity Tolerance: Patient tolerated treatment well Patient left: in chair;with call bell/phone within reach;with chair alarm set Nurse Communication: Patient requests pain meds;Mobility status PT Visit Diagnosis: Other abnormalities of gait and mobility (R26.89);Muscle weakness (generalized) (M62.81);Unsteadiness on feet (R26.81);Difficulty in walking, not elsewhere classified (R26.2);Pain Pain - Right/Left: Right Pain - part of body: Leg     Time: 1405-1430 PT Time Calculation (min) (ACUTE ONLY): 25 min  Charges:  $Gait Training: 8-22 mins $Therapeutic Activity: 8-22 mins                    Rica Koyanagi  PTA Acute  Rehabilitation Services Pager      [phone] Office      [phone]

## 2021-07-12 ENCOUNTER — Encounter (HOSPITAL_COMMUNITY): Payer: Self-pay

## 2021-07-12 ENCOUNTER — Encounter (HOSPITAL_COMMUNITY): Payer: Self-pay | Admitting: Vascular Surgery

## 2021-07-12 ENCOUNTER — Other Ambulatory Visit: Payer: Self-pay

## 2021-07-12 ENCOUNTER — Ambulatory Visit (INDEPENDENT_AMBULATORY_CARE_PROVIDER_SITE_OTHER): Payer: Self-pay | Admitting: Physician Assistant

## 2021-07-12 ENCOUNTER — Ambulatory Visit: Payer: Self-pay | Admitting: Vascular Surgery

## 2021-07-12 ENCOUNTER — Other Ambulatory Visit (HOSPITAL_COMMUNITY): Payer: Self-pay

## 2021-07-12 ENCOUNTER — Telehealth: Payer: Self-pay | Admitting: Oncology

## 2021-07-12 ENCOUNTER — Telehealth: Payer: Self-pay

## 2021-07-12 VITALS — BP 117/84 | HR 101 | Temp 97.2°F | Resp 20 | Ht 74.0 in | Wt 187.0 lb

## 2021-07-12 DIAGNOSIS — M7989 Other specified soft tissue disorders: Secondary | ICD-10-CM

## 2021-07-12 DIAGNOSIS — I739 Peripheral vascular disease, unspecified: Secondary | ICD-10-CM

## 2021-07-12 DIAGNOSIS — L7682 Other postprocedural complications of skin and subcutaneous tissue: Secondary | ICD-10-CM

## 2021-07-12 DIAGNOSIS — T8131XA Disruption of external operation (surgical) wound, not elsewhere classified, initial encounter: Secondary | ICD-10-CM

## 2021-07-12 MED ORDER — OXYCODONE-ACETAMINOPHEN 10-325 MG PO TABS: 1.0000 | ORAL_TABLET | Freq: Four times a day (QID) | ORAL | 0 refills | Status: DC | PRN

## 2021-07-12 NOTE — Addendum Note (Signed)
Addended by: Barbie Banner on: 07/12/2021 11:24 AM   Modules accepted: Orders

## 2021-07-12 NOTE — Telephone Encounter (Signed)
Arthur Mcmahon [facility] RN calls nurse line requesting verbal orders for [facility] nursing as follows.   3x a week for 3 weeks.   VO given.

## 2021-07-12 NOTE — Telephone Encounter (Signed)
Scheduled appt per 11/4 referral. Pt's wife is aware of appt date and time.

## 2021-07-12 NOTE — Progress Notes (Signed)
  POST OPERATIVE OFFICE NOTE    CC:  F/u for surgery  HPI:  This is a 55 y.o. male who is s/p right CFA endarterectomy with bovine pericardial patch right lower extremity thrombectomy via popliteal approach and 4 compartment fasciotomies by Dr. Carlis Abbott on 06/19/2021 secondary to acute on chronic ischemia of the right lower extremity. Prior to discharge, his heparin was converted to apixaban and Plavix discontinued. He developed serous/lymphatic drainage of right groin incision and was discharged with wound VAC in place. He presents today for wound check.   He complains of ongoing right foot pain and swelling. Had to change home VAC canister this morning that was placed yesterday (300 cc). He denies issues with LLE.  Previous left CFA endarterectomy with bovine pericaridal patch and popliteal stent placement August 2022  No Known Allergies  Current Outpatient Medications  Medication Sig Dispense Refill   acetaminophen (TYLENOL) 500 MG tablet Take 500 mg by mouth every 6 (six) hours as needed for mild pain, fever or headache.     apixaban (ELIQUIS) 5 MG TABS tablet Take 1 tablet (5 mg total) by mouth 2 (two) times daily. 60 tablet 11   HYDROcodone-acetaminophen (NORCO) 5-325 MG tablet Take 1-2 tablets by mouth every 6 (six) hours as needed for severe pain. 20 tablet 0   Multiple Vitamin (MULTIVITAMIN WITH MINERALS) TABS tablet Take 1 tablet by mouth daily.     senna-docusate (SENOKOT-S) 8.6-50 MG tablet Take 2 tablets by mouth 2 (two) times daily.     No current facility-administered medications for this visit.     ROS:  See HPI  Vitals:   07/12/21 0913  BP: 117/84  Pulse: (!) 101  Resp: 20  Temp: (!) 97.2 F (36.2 C)  SpO2: 100%     Physical Exam:  General appearance: Awake, alert in no apparent distress Cardiac: Heart rate and rhythm are regular Respirations: Nonlabored Incisions: Right groin with wound VAC in place. Approximately 300cc output last 16-18 hours. Right lower  leg incisions have necrotic skin changes, serous drainage and partial dehiscence.  Extremities: Right lower extremity is edematous to the knee level. Right forefoot is cool with intact sensation and motor function.  Skin mottled to knee.  Pulse/Doppler exam: Biphasic peroneal artery and monophasic PT Doppler signals   Assessment/Plan:  This is a 56 y.o. male who is s/p: right lower extremity thrombectomy via popliteal approach, right CFA endarterectomy and 4 compartment fasciotomies with right lower leg dehiscence and necrosis. Persistant lymphatic right drainage.  Discussed with Dr. Carlis Abbott who examined the patient.  We will plan for right groin incision and drainage and debridement of right lower leg fasciotomy incisions tomorrow in the operating room.  The patient has not taken his morning Eliquis and he is instructed to hold this medication.  He is in agreement with this plan.  Advised regarding smoking cessation and protein consumption for adequate healing and ongoing vascular disease.  Risa Grill, PA-C [facility] [phone]  Clinic MD:  Dr. Carlis Abbott

## 2021-07-12 NOTE — Progress Notes (Signed)
Mr. Dyon Rotert denies chest pain or shortness of breath.  Patient denies having any s/s of Covid in his household.  Patient denies any known exposure to Covid.   PCP is with [facility].  I instructed patient to shower with antibiotic soap, if it is available.  Dry off with a clean towel. Do not put lotion, powder, cologne or deodorant or makeup.No jewelry or piercings. Men may shave their face and neck. Woman should not shave. No nail polish, artificial or acrylic nails. Wear clean clothes, brush your teeth. Glasses, contact lens,dentures or partials may not be worn in the OR. If you need to wear them, please bring a case for glasses, do not wear contacts or bring a case, the hospital does not have contact cases, dentures or partials will have to be removed , make sure they are clean, we will provide a denture cup to put them in. You will need some one to drive you home and a responsible person over the age of 56 to stay with you for the first 24 hours after surgery.

## 2021-07-13 ENCOUNTER — Inpatient Hospital Stay (HOSPITAL_COMMUNITY)
Admission: RE | Admit: 2021-07-13 | Discharge: 2021-08-10 | DRG: 901 | Disposition: A | Discharge status: Hospice | Payer: Medicaid Other | Attending: Family Medicine | Admitting: Family Medicine

## 2021-07-13 ENCOUNTER — Inpatient Hospital Stay (HOSPITAL_COMMUNITY): Payer: Medicaid Other | Admitting: Anesthesiology

## 2021-07-13 ENCOUNTER — Encounter (HOSPITAL_COMMUNITY)
Admission: RE | Disposition: A | Discharge status: Hospice | Payer: Self-pay | Source: Home / Self Care | Attending: Family Medicine

## 2021-07-13 ENCOUNTER — Other Ambulatory Visit: Payer: Self-pay

## 2021-07-13 ENCOUNTER — Encounter (HOSPITAL_COMMUNITY): Payer: Self-pay | Admitting: Vascular Surgery

## 2021-07-13 DIAGNOSIS — E8809 Other disorders of plasma-protein metabolism, not elsewhere classified: Secondary | ICD-10-CM | POA: Diagnosis present

## 2021-07-13 DIAGNOSIS — Z7901 Long term (current) use of anticoagulants: Secondary | ICD-10-CM

## 2021-07-13 DIAGNOSIS — D62 Acute posthemorrhagic anemia: Secondary | ICD-10-CM | POA: Diagnosis not present

## 2021-07-13 DIAGNOSIS — C772 Secondary and unspecified malignant neoplasm of intra-abdominal lymph nodes: Secondary | ICD-10-CM | POA: Diagnosis present

## 2021-07-13 DIAGNOSIS — I129 Hypertensive chronic kidney disease with stage 1 through stage 4 chronic kidney disease, or unspecified chronic kidney disease: Secondary | ICD-10-CM | POA: Diagnosis present

## 2021-07-13 DIAGNOSIS — Z79899 Other long term (current) drug therapy: Secondary | ICD-10-CM

## 2021-07-13 DIAGNOSIS — N170 Acute kidney failure with tubular necrosis: Secondary | ICD-10-CM | POA: Diagnosis present

## 2021-07-13 DIAGNOSIS — I898 Other specified noninfective disorders of lymphatic vessels and lymph nodes: Secondary | ICD-10-CM

## 2021-07-13 DIAGNOSIS — Z6824 Body mass index (BMI) 24.0-24.9, adult: Secondary | ICD-10-CM

## 2021-07-13 DIAGNOSIS — Z8249 Family history of ischemic heart disease and other diseases of the circulatory system: Secondary | ICD-10-CM

## 2021-07-13 DIAGNOSIS — E871 Hypo-osmolality and hyponatremia: Secondary | ICD-10-CM | POA: Diagnosis present

## 2021-07-13 DIAGNOSIS — L89152 Pressure ulcer of sacral region, stage 2: Secondary | ICD-10-CM | POA: Diagnosis present

## 2021-07-13 DIAGNOSIS — M79671 Pain in right foot: Secondary | ICD-10-CM | POA: Diagnosis present

## 2021-07-13 DIAGNOSIS — E875 Hyperkalemia: Secondary | ICD-10-CM | POA: Diagnosis not present

## 2021-07-13 DIAGNOSIS — D75839 Thrombocytosis, unspecified: Secondary | ICD-10-CM | POA: Diagnosis not present

## 2021-07-13 DIAGNOSIS — R319 Hematuria, unspecified: Secondary | ICD-10-CM | POA: Diagnosis present

## 2021-07-13 DIAGNOSIS — C770 Secondary and unspecified malignant neoplasm of lymph nodes of head, face and neck: Secondary | ICD-10-CM | POA: Diagnosis present

## 2021-07-13 DIAGNOSIS — E43 Unspecified severe protein-calorie malnutrition: Secondary | ICD-10-CM | POA: Diagnosis present

## 2021-07-13 DIAGNOSIS — N1330 Unspecified hydronephrosis: Secondary | ICD-10-CM

## 2021-07-13 DIAGNOSIS — I739 Peripheral vascular disease, unspecified: Secondary | ICD-10-CM | POA: Diagnosis present

## 2021-07-13 DIAGNOSIS — E861 Hypovolemia: Secondary | ICD-10-CM | POA: Diagnosis present

## 2021-07-13 DIAGNOSIS — N179 Acute kidney failure, unspecified: Secondary | ICD-10-CM | POA: Diagnosis not present

## 2021-07-13 DIAGNOSIS — R590 Localized enlarged lymph nodes: Secondary | ICD-10-CM | POA: Diagnosis present

## 2021-07-13 DIAGNOSIS — I871 Compression of vein: Secondary | ICD-10-CM | POA: Diagnosis present

## 2021-07-13 DIAGNOSIS — Z515 Encounter for palliative care: Secondary | ICD-10-CM

## 2021-07-13 DIAGNOSIS — Z8701 Personal history of pneumonia (recurrent): Secondary | ICD-10-CM

## 2021-07-13 DIAGNOSIS — I890 Lymphedema, not elsewhere classified: Secondary | ICD-10-CM

## 2021-07-13 DIAGNOSIS — I998 Other disorder of circulatory system: Secondary | ICD-10-CM | POA: Diagnosis present

## 2021-07-13 DIAGNOSIS — T8131XA Disruption of external operation (surgical) wound, not elsewhere classified, initial encounter: Secondary | ICD-10-CM | POA: Diagnosis present

## 2021-07-13 DIAGNOSIS — K5900 Constipation, unspecified: Secondary | ICD-10-CM | POA: Diagnosis present

## 2021-07-13 DIAGNOSIS — Z66 Do not resuscitate: Secondary | ICD-10-CM | POA: Diagnosis not present

## 2021-07-13 DIAGNOSIS — E039 Hypothyroidism, unspecified: Secondary | ICD-10-CM | POA: Diagnosis present

## 2021-07-13 DIAGNOSIS — N1832 Chronic kidney disease, stage 3b: Secondary | ICD-10-CM | POA: Diagnosis present

## 2021-07-13 DIAGNOSIS — Y838 Other surgical procedures as the cause of abnormal reaction of the patient, or of later complication, without mention of misadventure at the time of the procedure: Secondary | ICD-10-CM | POA: Diagnosis present

## 2021-07-13 DIAGNOSIS — Z720 Tobacco use: Secondary | ICD-10-CM

## 2021-07-13 DIAGNOSIS — N136 Pyonephrosis: Secondary | ICD-10-CM | POA: Diagnosis present

## 2021-07-13 DIAGNOSIS — C7910 Secondary malignant neoplasm of unspecified urinary organs: Secondary | ICD-10-CM | POA: Diagnosis present

## 2021-07-13 DIAGNOSIS — C679 Malignant neoplasm of bladder, unspecified: Secondary | ICD-10-CM | POA: Diagnosis present

## 2021-07-13 DIAGNOSIS — A419 Sepsis, unspecified organism: Secondary | ICD-10-CM

## 2021-07-13 DIAGNOSIS — R946 Abnormal results of thyroid function studies: Secondary | ICD-10-CM | POA: Diagnosis present

## 2021-07-13 DIAGNOSIS — Z4889 Encounter for other specified surgical aftercare: Secondary | ICD-10-CM

## 2021-07-13 DIAGNOSIS — E8770 Fluid overload, unspecified: Secondary | ICD-10-CM | POA: Diagnosis not present

## 2021-07-13 DIAGNOSIS — F17210 Nicotine dependence, cigarettes, uncomplicated: Secondary | ICD-10-CM | POA: Diagnosis present

## 2021-07-13 DIAGNOSIS — Z20822 Contact with and (suspected) exposure to covid-19: Secondary | ICD-10-CM | POA: Diagnosis present

## 2021-07-13 DIAGNOSIS — T8142XA Infection following a procedure, deep incisional surgical site, initial encounter: Secondary | ICD-10-CM

## 2021-07-13 DIAGNOSIS — R197 Diarrhea, unspecified: Secondary | ICD-10-CM | POA: Diagnosis not present

## 2021-07-13 HISTORY — DX: Malignant (primary) neoplasm, unspecified: C80.1

## 2021-07-13 HISTORY — PX: WOUND DEBRIDEMENT: SHX247

## 2021-07-13 HISTORY — DX: Constipation, unspecified: K59.00

## 2021-07-13 HISTORY — PX: APPLICATION OF WOUND VAC: SHX5189

## 2021-07-13 LAB — SARS CORONAVIRUS 2 BY RT PCR (HOSPITAL ORDER, PERFORMED IN ~~LOC~~ HOSPITAL LAB): SARS Coronavirus 2: NEGATIVE

## 2021-07-13 LAB — POCT I-STAT, CHEM 8
BUN: 10 mg/dL (ref 6–20)
Calcium, Ion: 1.07 mmol/L — ABNORMAL LOW (ref 1.15–1.40)
Chloride: 94 mmol/L — ABNORMAL LOW (ref 98–111)
Creatinine, Ser: 0.9 mg/dL (ref 0.61–1.24)
Glucose, Bld: 100 mg/dL — ABNORMAL HIGH (ref 70–99)
HCT: 34 % — ABNORMAL LOW (ref 39.0–52.0)
Hemoglobin: 11.6 g/dL — ABNORMAL LOW (ref 13.0–17.0)
Potassium: 4.7 mmol/L (ref 3.5–5.1)
Sodium: 128 mmol/L — ABNORMAL LOW (ref 135–145)
TCO2: 24 mmol/L (ref 22–32)

## 2021-07-13 SURGERY — DEBRIDEMENT, WOUND
Anesthesia: General | Site: Leg Lower | Laterality: Right

## 2021-07-13 MED ORDER — OXYCODONE HCL 5 MG/5ML PO SOLN: 5.0000 mg | Freq: Once | ORAL | Status: DC | PRN

## 2021-07-13 MED ORDER — ONDANSETRON HCL 4 MG/2ML IJ SOLN
INTRAMUSCULAR | Status: DC | PRN
  Administered 2021-07-13: 4 mg via INTRAVENOUS

## 2021-07-13 MED ORDER — FENTANYL CITRATE (PF) 250 MCG/5ML IJ SOLN
INTRAMUSCULAR | Status: AC
  Filled 2021-07-13: qty 5

## 2021-07-13 MED ORDER — ACETAMINOPHEN 10 MG/ML IV SOLN: 1000.0000 mg | Freq: Once | INTRAVENOUS | Status: DC | PRN

## 2021-07-13 MED ORDER — 0.9 % SODIUM CHLORIDE (POUR BTL) OPTIME
TOPICAL | Status: DC | PRN
  Administered 2021-07-13: 1000 mL

## 2021-07-13 MED ORDER — FENTANYL CITRATE (PF) 100 MCG/2ML IJ SOLN
INTRAMUSCULAR | Status: AC
  Filled 2021-07-13: qty 2

## 2021-07-13 MED ORDER — MIDAZOLAM HCL 2 MG/2ML IJ SOLN
INTRAMUSCULAR | Status: AC
  Filled 2021-07-13: qty 2

## 2021-07-13 MED ORDER — MORPHINE SULFATE (PF) 2 MG/ML IV SOLN
2.0000 mg | INTRAVENOUS | Status: DC | PRN
  Administered 2021-07-13 – 2021-07-14 (×3): 2 mg via INTRAVENOUS
  Filled 2021-07-13 (×3): qty 1

## 2021-07-13 MED ORDER — LIDOCAINE 2% (20 MG/ML) 5 ML SYRINGE
INTRAMUSCULAR | Status: DC | PRN
  Administered 2021-07-13: 60 mg via INTRAVENOUS

## 2021-07-13 MED ORDER — MIDAZOLAM HCL 2 MG/2ML IJ SOLN
INTRAMUSCULAR | Status: DC | PRN
  Administered 2021-07-13: 2 mg via INTRAVENOUS

## 2021-07-13 MED ORDER — DEXAMETHASONE SODIUM PHOSPHATE 10 MG/ML IJ SOLN
INTRAMUSCULAR | Status: DC | PRN
  Administered 2021-07-13: 8 mg via INTRAVENOUS

## 2021-07-13 MED ORDER — OXYCODONE-ACETAMINOPHEN 5-325 MG PO TABS
1.0000 | ORAL_TABLET | ORAL | Status: DC | PRN
  Administered 2021-07-13 – 2021-07-16 (×12): 2 via ORAL
  Administered 2021-07-16 (×2): 1 via ORAL
  Administered 2021-07-16 – 2021-07-24 (×33): 2 via ORAL
  Filled 2021-07-13 (×3): qty 2
  Filled 2021-07-13: qty 1
  Filled 2021-07-13 (×8): qty 2
  Filled 2021-07-13: qty 1
  Filled 2021-07-13 (×4): qty 2
  Filled 2021-07-13: qty 1
  Filled 2021-07-13: qty 2
  Filled 2021-07-13: qty 1
  Filled 2021-07-13 (×30): qty 2

## 2021-07-13 MED ORDER — POLYETHYLENE GLYCOL 3350 17 G PO PACK
17.0000 g | PACK | Freq: Two times a day (BID) | ORAL | Status: DC
  Administered 2021-07-13 – 2021-07-15 (×4): 17 g via ORAL
  Filled 2021-07-13 (×5): qty 1

## 2021-07-13 MED ORDER — ACETAMINOPHEN 10 MG/ML IV SOLN
INTRAVENOUS | Status: AC
  Filled 2021-07-13: qty 100

## 2021-07-13 MED ORDER — LACTATED RINGERS IV SOLN: INTRAVENOUS | Status: DC

## 2021-07-13 MED ORDER — DEXAMETHASONE SODIUM PHOSPHATE 10 MG/ML IJ SOLN
INTRAMUSCULAR | Status: AC
  Filled 2021-07-13: qty 1

## 2021-07-13 MED ORDER — SUGAMMADEX SODIUM 200 MG/2ML IV SOLN
INTRAVENOUS | Status: DC | PRN
  Administered 2021-07-13: 200 mg via INTRAVENOUS

## 2021-07-13 MED ORDER — ONDANSETRON HCL 4 MG/2ML IJ SOLN
INTRAMUSCULAR | Status: AC
  Filled 2021-07-13: qty 2

## 2021-07-13 MED ORDER — ACETAMINOPHEN 500 MG PO TABS: 1000.0000 mg | ORAL_TABLET | Freq: Once | ORAL | Status: DC | PRN

## 2021-07-13 MED ORDER — ROCURONIUM BROMIDE 10 MG/ML (PF) SYRINGE
PREFILLED_SYRINGE | INTRAVENOUS | Status: DC | PRN
  Administered 2021-07-13: 60 mg via INTRAVENOUS
  Administered 2021-07-13: 40 mg via INTRAVENOUS

## 2021-07-13 MED ORDER — CHLORHEXIDINE GLUCONATE 0.12 % MT SOLN
15.0000 mL | Freq: Once | OROMUCOSAL | Status: AC
  Administered 2021-07-13: 15 mL via OROMUCOSAL
  Filled 2021-07-13: qty 15

## 2021-07-13 MED ORDER — CEFAZOLIN SODIUM-DEXTROSE 1-4 GM/50ML-% IV SOLN
1.0000 g | Freq: Three times a day (TID) | INTRAVENOUS | Status: AC
  Administered 2021-07-13 – 2021-07-14 (×2): 1 g via INTRAVENOUS
  Filled 2021-07-13 (×3): qty 50

## 2021-07-13 MED ORDER — CHLORHEXIDINE GLUCONATE 4 % EX LIQD: 60.0000 mL | Freq: Once | CUTANEOUS | Status: DC

## 2021-07-13 MED ORDER — CEFAZOLIN SODIUM-DEXTROSE 1-4 GM/50ML-% IV SOLN
1.0000 g | Freq: Three times a day (TID) | INTRAVENOUS | Status: DC
  Filled 2021-07-13 (×2): qty 50

## 2021-07-13 MED ORDER — ACETAMINOPHEN 10 MG/ML IV SOLN
INTRAVENOUS | Status: DC | PRN
  Administered 2021-07-13: 1000 mg via INTRAVENOUS

## 2021-07-13 MED ORDER — ORAL CARE MOUTH RINSE: 15.0000 mL | Freq: Once | OROMUCOSAL | Status: AC

## 2021-07-13 MED ORDER — PROPOFOL 10 MG/ML IV BOLUS
INTRAVENOUS | Status: AC
  Filled 2021-07-13: qty 20

## 2021-07-13 MED ORDER — OXYCODONE HCL 5 MG PO TABS: 5.0000 mg | ORAL_TABLET | Freq: Once | ORAL | Status: DC | PRN

## 2021-07-13 MED ORDER — ACETAMINOPHEN 160 MG/5ML PO SOLN: 1000.0000 mg | Freq: Once | ORAL | Status: DC | PRN

## 2021-07-13 MED ORDER — SODIUM CHLORIDE 0.9 % IV SOLN: INTRAVENOUS | Status: DC

## 2021-07-13 MED ORDER — LIDOCAINE 2% (20 MG/ML) 5 ML SYRINGE
INTRAMUSCULAR | Status: AC
  Filled 2021-07-13: qty 5

## 2021-07-13 MED ORDER — FENTANYL CITRATE (PF) 100 MCG/2ML IJ SOLN
25.0000 ug | INTRAMUSCULAR | Status: DC | PRN
  Administered 2021-07-13 (×2): 25 ug via INTRAVENOUS
  Administered 2021-07-13: 50 ug via INTRAVENOUS

## 2021-07-13 MED ORDER — PROPOFOL 10 MG/ML IV BOLUS
INTRAVENOUS | Status: DC | PRN
  Administered 2021-07-13: 20 mg via INTRAVENOUS
  Administered 2021-07-13: 60 mg via INTRAVENOUS

## 2021-07-13 MED ORDER — CEFAZOLIN SODIUM-DEXTROSE 2-4 GM/100ML-% IV SOLN
2.0000 g | INTRAVENOUS | Status: AC
  Administered 2021-07-13: 2 g via INTRAVENOUS
  Filled 2021-07-13: qty 100

## 2021-07-13 MED ORDER — FENTANYL CITRATE (PF) 100 MCG/2ML IJ SOLN
INTRAMUSCULAR | Status: DC | PRN
  Administered 2021-07-13: 50 ug via INTRAVENOUS

## 2021-07-13 MED ORDER — SENNA 8.6 MG PO TABS
1.0000 | ORAL_TABLET | Freq: Every day | ORAL | Status: DC
  Administered 2021-07-14 – 2021-07-15 (×2): 8.6 mg via ORAL
  Filled 2021-07-13 (×3): qty 1

## 2021-07-13 SURGICAL SUPPLY — 39 items
BAG COUNTER SPONGE SURGICOUNT (BAG) ×3 IMPLANT
BNDG CMPR MED 15X6 ELC VLCR LF (GAUZE/BANDAGES/DRESSINGS) ×1
BNDG ELASTIC 4X5.8 VLCR STR LF (GAUZE/BANDAGES/DRESSINGS) ×3 IMPLANT
BNDG ELASTIC 6X15 VLCR STRL LF (GAUZE/BANDAGES/DRESSINGS) ×3 IMPLANT
BNDG GAUZE ELAST 4 BULKY (GAUZE/BANDAGES/DRESSINGS) ×9 IMPLANT
CANISTER SUCT 3000ML PPV (MISCELLANEOUS) ×3 IMPLANT
CANISTER WOUND CARE 500ML ATS (WOUND CARE) ×3 IMPLANT
CLIP TI MEDIUM 6 (CLIP) ×3 IMPLANT
CLIP TI WIDE RED SMALL 6 (CLIP) ×3 IMPLANT
COVER SURGICAL LIGHT HANDLE (MISCELLANEOUS) ×3 IMPLANT
DRAPE ORTHO SPLIT 77X108 STRL (DRAPES) ×6
DRAPE SURG ORHT 6 SPLT 77X108 (DRAPES) ×4 IMPLANT
DRSG VAC ATS LRG SENSATRAC (GAUZE/BANDAGES/DRESSINGS) IMPLANT
DRSG VAC ATS MED SENSATRAC (GAUZE/BANDAGES/DRESSINGS) IMPLANT
DRSG VAC ATS SM SENSATRAC (GAUZE/BANDAGES/DRESSINGS) ×3 IMPLANT
ELECT REM PT RETURN 9FT ADLT (ELECTROSURGICAL) ×3
ELECTRODE REM PT RTRN 9FT ADLT (ELECTROSURGICAL) ×2 IMPLANT
GLOVE SRG 8 PF TXTR STRL LF DI (GLOVE) ×2 IMPLANT
GLOVE SURG ENC MOIS LTX SZ7.5 (GLOVE) ×3 IMPLANT
GLOVE SURG UNDER POLY LF SZ8 (GLOVE) ×3
GOWN STRL REUS W/ TWL LRG LVL3 (GOWN DISPOSABLE) ×4 IMPLANT
GOWN STRL REUS W/ TWL XL LVL3 (GOWN DISPOSABLE) ×4 IMPLANT
GOWN STRL REUS W/TWL LRG LVL3 (GOWN DISPOSABLE) ×6
GOWN STRL REUS W/TWL XL LVL3 (GOWN DISPOSABLE) ×6
KIT BASIN OR (CUSTOM PROCEDURE TRAY) ×3 IMPLANT
KIT TURNOVER KIT B (KITS) ×3 IMPLANT
MARKER SKIN DUAL TIP RULER LAB (MISCELLANEOUS) ×3 IMPLANT
NS IRRIG 1000ML POUR BTL (IV SOLUTION) ×3 IMPLANT
PACK GENERAL/GYN (CUSTOM PROCEDURE TRAY) ×3 IMPLANT
PAD ARMBOARD 7.5X6 YLW CONV (MISCELLANEOUS) ×6 IMPLANT
STAPLER VISISTAT 35W (STAPLE) IMPLANT
SUT ETHILON 2 0 PSLX (SUTURE) IMPLANT
SUT SILK 2 0 (SUTURE) ×3
SUT SILK 2-0 18XBRD TIE 12 (SUTURE) ×2 IMPLANT
SUT SILK 3 0 (SUTURE) ×3
SUT SILK 3 0 SH CR/8 (SUTURE) ×6 IMPLANT
SUT SILK 3-0 18XBRD TIE 12 (SUTURE) ×2 IMPLANT
TOWEL GREEN STERILE (TOWEL DISPOSABLE) ×3 IMPLANT
WATER STERILE IRR 1000ML POUR (IV SOLUTION) IMPLANT

## 2021-07-13 NOTE — Transfer of Care (Signed)
Immediate Anesthesia Transfer of Care Note  Patient: Arthur Mcmahon  Procedure(s) Performed: DEBREDIEMENT OF RIGHT GROIN AND RIGHT LATERAL AND MEDIAL LOWER EXTREMITY FASCIOTOMY SITES (Right: Leg Lower) APPLICATION OF WOUND VAC OF RIGHT GROIN (Right: Groin)  Patient Location: PACU  Anesthesia Type:General  Level of Consciousness: awake and oriented  Airway & Oxygen Therapy: Patient Spontanous Breathing and Patient connected to face mask oxygen  Post-op Assessment: Report given to RN  Post vital signs: Reviewed and stable  Last Vitals:  Vitals Value Taken Time  BP 123/78 07/13/21 1153  Temp    Pulse 71 07/13/21 1155  Resp 17 07/13/21 1155  SpO2 98 % 07/13/21 1155  Vitals shown include unvalidated device data.  Last Pain:  Vitals:   07/13/21 0755  PainSc: 8          Complications: No notable events documented.

## 2021-07-13 NOTE — Progress Notes (Signed)
Na 128 on ISTAT - Dr. Ermalene Postin was notified.

## 2021-07-13 NOTE — Anesthesia Preprocedure Evaluation (Signed)
Anesthesia Evaluation  Patient identified by MRN, date of birth, ID band Patient awake    Reviewed: Allergy & Precautions, NPO status , Patient's Chart, lab work & pertinent test results  History of Anesthesia Complications Negative for: history of anesthetic complications  Airway Mallampati: IV  TM Distance: >3 FB Neck ROM: Full    Dental  (+) Dental Advisory Given, Poor Dentition, Missing,    Pulmonary neg COPD, neg recent URI, Current SmokerPatient did not abstain from smoking.,    breath sounds clear to auscultation       Cardiovascular hypertension, Pt. on medications (-) angina+ Peripheral Vascular Disease  (-) dysrhythmias  Rhythm:Regular  1. Left ventricular ejection fraction, by estimation, is 60 to 65%. The  left ventricle has normal function. The left ventricle has no regional  wall motion abnormalities. Left ventricular diastolic parameters were  normal.  2. Right ventricular systolic function is normal. The right ventricular  size is normal. Tricuspid regurgitation signal is inadequate for assessing  PA pressure.  3. Right atrial size was mildly dilated.  4. The mitral valve is normal in structure. No evidence of mitral valve  regurgitation. No evidence of mitral stenosis.  5. The aortic valve was not well visualized. Aortic valve regurgitation  is not visualized. No aortic stenosis is present.  6. The inferior vena cava is normal in size with greater than 50%  respiratory variability, suggesting right atrial pressure of 3 mmHg.    Neuro/Psych negative neurological ROS  negative psych ROS   GI/Hepatic negative GI ROS, Lab Results      Component                Value               Date                      ALT                      32                  07/04/2021                AST                      49 (H)              07/04/2021                ALKPHOS                  74                  07/04/2021                 BILITOT                  0.7                 07/04/2021              Endo/Other  negative endocrine ROS  Renal/GU negative Renal ROS     Musculoskeletal negative musculoskeletal ROS (+)   Abdominal   Peds  Hematology  (+) Blood dyscrasia, anemia , Lab Results      Component                Value  Date                      WBC                      11.3 (H)            07/08/2021                HGB                      11.6 (L)            07/13/2021                HCT                      34.0 (L)            07/13/2021                MCV                      94.6                07/08/2021                PLT                      298                 07/08/2021              Anesthesia Other Findings   Reproductive/Obstetrics                             Anesthesia Physical Anesthesia Plan  ASA: 3  Anesthesia Plan: General   Post-op Pain Management:    Induction: Intravenous  PONV Risk Score and Plan: 1 and Ondansetron and Dexamethasone  Airway Management Planned: Oral ETT  Additional Equipment: None  Intra-op Plan:   Post-operative Plan: Extubation in OR  Informed Consent: I have reviewed the patients History and Physical, chart, labs and discussed the procedure including the risks, benefits and alternatives for the proposed anesthesia with the patient or authorized representative who has indicated his/her understanding and acceptance.     Dental advisory given  Plan Discussed with: CRNA and Anesthesiologist  Anesthesia Plan Comments:         Anesthesia Quick Evaluation

## 2021-07-13 NOTE — Progress Notes (Signed)
Patient admitted from PACU s/p incision and debridement of previous incisions due to right lower extremity femoral endarectomy and faschiotomy. Patient right lower extremity has wounc vac to groin inscision and compression wrap to faschiotomy. +3 edema in RLE, +2 edema in LLE. Patient is alert and oriented x4. 2mg  morphine administered for pain. VS WNL. He is currently resting in no acute distress. Fuller Canada, RN

## 2021-07-13 NOTE — Op Note (Signed)
Date: July 13, 2021  Preoperative diagnosis: 1.  Lymphatic leak right groin incision  2.  Necrotic right lower extremity fasciotomy incisions  Postoperative diagnosis: Same  Procedure: 1.  Sharp excisional debridement right groin wound including skin and subcutaneous tissue with application of negative pressure wound VAC (8 cm x 2 cm x 3 cm deep) 2.  Sharp excisional debridement of right lower extremity medial fasciotomy incision including skin, subcutaneous tissue, muscle and fascia (5 cm x 15 cm x 2 cm deep) 3.  Sharp excisional debridement of right lower extremity lateral fasciotomy including skin and subcutaneous tissue (12 cm x 3 cm x 1 cm deep)  Surgeon: Dr. [name], MD  Assistant: Leontine Locket, PA  Indications: Patient is a 55 year old male who presented to the ED on 06/18/21 with acutely ischemic right lower extremity.  He underwent right common femoral endarterectomy for a totally occluded common femoral as well as lower extremity thrombectomy and fasciotomies.  He has had profound leg swelling postoperative and has been diagnosed with metastatic bladder cancer with lymphadenopathy in the groin noted on preoperative imaging.  He presents today for debridement and VAC placement in his groin incision for ongoing lymphatic leak and debridement of bilateral lower extremity fasciotomy incisions after these lower extremity incisions have become necrotic.  An assistant was needed for exposure and expedite the case.  Findings: The right groin was debrided after the incision was reopened.  The common femoral artery and patch was not exposed and had granulation tissue on top of this and in the base of the wound.  We found several areas of lymphatic leakage that were repaired with 3-0 silk suture figure-of-eight fashion.  The wound was then irrigated and negative pressure wound VAC was placed with small sponge.  Bilateral lower extremity fasciotomies were then debrided with sharp  excisional debridement in order to get to healthy appearing tissue and these were packed with wet Kerlix and then wrapped with dry sterile dressings including Ace for compression of the leg.  Anesthesia: General  Details: Patient was taken to the operating room after informed consent was obtained.  Placed on the operative table in the supine position.  General endotracheal anesthesia was induced.  The right groin and right lower extremity was then prepped and draped in standard sterile fashion.  Timeout was performed.  Antibiotics were given.  Initially started in the right groin where the medial incision had separation with drainage of lymphatic fluid.  I then probed this with my finger and was able to open up the entire incision to expose the subcutaneous tissue.  There was a  cavity under the incision.  The wound was then copiously irrigated.  We debrided back some of the fat necrosis with Metzenbaum scissors.  The artery was palpable in the base of the wound but was not exposed with good granulation tissue coverage  here.  We found several areas of apparent lymphatic leakage from the subcutaneous tissue that was repaired with 3-0 silk sutures in figure-of-eight fashion.  This was then irrigated out again once we had good hemostasis and then packed with a small VAC sponge in the wound and then a track pad was placed with good seal at -125 mmHg.  Then turned our attention to the right lower extremity fasciotomies.  Initially went to the medial fasciotomy and the staples were removed with hemostat.  We then used Metzenbaum scissors with scalpel to perform sharp excisional debridement of all grossly necrotic tissue.  This included resection of  skin, subcutaneous tissue, muscle and fascia.  Ultimately we debrided until we had healthy tissue margins and the wound measured 5 cm x 15 cm x 2 cm deep.  We then went lateral and again removed the staples with hemostat.  Again used the Metzenbaum scissors and scalpel  to perform sharp excisional debridement of all necrotic tissue including skin and subcutaneous tissue and the wound measured 12 cm x 3 cm x 1 cm deep.  Both wounds were then irrigated out.  The muscle did appear viable.  Both wounds were packed with wet Kerlix and then wrapped with a dry Kerlix and an Ace bandage from the foot all the way up to the groin to help with the leg swelling.  Taken to recovery in stable condition.  Complication: None  Condition: Stable  [name], MD [facility] [place] Office: [place]

## 2021-07-13 NOTE — Progress Notes (Signed)
Patient arrived in short stay in a wheelchair. Alert and oriented, able to verbalized his needs. Patient has a surgical wound on right leg with wound vac. The wound vac canister was changed in short stay. Patient has generalized bruising and a stage 2 pressure ulcer on sacrum; patient is complaining of pain on that area. The area around the wound is red and blanchable. A foam dressing was applied on sacral area.

## 2021-07-13 NOTE — Anesthesia Procedure Notes (Signed)
Procedure Name: Intubation Date/Time: 07/13/2021 10:27 AM Performed by: Barrington Ellison, CRNA Pre-anesthesia Checklist: Patient identified, Emergency Drugs available, Suction available and Patient being monitored Patient Re-evaluated:Patient Re-evaluated prior to induction Oxygen Delivery Method: Circle System Utilized Preoxygenation: Pre-oxygenation with 100% oxygen Induction Type: IV induction Ventilation: Mask ventilation without difficulty and Oral airway inserted - appropriate to patient size Laryngoscope Size: Mac and 4 Grade View: Grade I Tube type: Oral Tube size: 7.5 mm Number of attempts: 2 Airway Equipment and Method: Stylet and Oral airway Placement Confirmation: ETT inserted through vocal cords under direct vision, positive ETCO2 and breath sounds checked- equal and bilateral Secured at: 22 cm Tube secured with: Tape Dental Injury: Teeth and Oropharynx as per pre-operative assessment

## 2021-07-13 NOTE — H&P (Addendum)
Arthur Mcmahon Admission History and Physical Service Pager: [phone]  Patient name: Arthur Mcmahon Medical record number: [id number] Date of birth: [birth year] Age: 55 y.o. Gender: male  Primary Care Provider: Shary Key, DO Consultants: Arthur Mcmahon Code Status: FULL Preferred Emergency Contact: Arthur Mcmahon, sister    Chief Complaint: post-op care   Assessment and Plan: Arthur Mcmahon is a 55 y.o. male presenting with wound VAC placement of groin incision today for ongoing lymphatic leak and debridement of necrotic lower extremity fasciotomy incisions. Past medical history includes metastatic bladder cancer with lymphadenopathy in the groin.  Necrosis of surgical incisions and lymphatic leakage s/p debridement and wound VAC placement  PAD Patient had follow up with Arthur where he says Arthur Mcmahon incisions were looking worse and he was having drainage from groin incision. He said Arthur Mcmahon right leg was very swollen. Today, patient had wound VAC placement of the groin incision for the ongoing lymphatic leak and debridement of necrotic lower extremity fasciotomy incisions. Had acutely ischemic right lower extremity 06/18/2021 s/p right common femoral endarterectomy and lower extremity thrombectomy and fasciotomies. Contacted by Arthur Mcmahon for admission for this patient for continued medical care.  Patient has remained afebrile in the Mcmahon.  Heart rates in 60s, and some elevated blood pressures to 140s over 70s. Has been on room air saturating at 100% after Mcmahon. Home med of Arthur Mcmahon started by Arthur on last admission 10/16 from previous of heparin and plavix discontinued, was held before this Mcmahon.  -Admit to Arthur Mcmahon, attending Dr. Gwendlyn Mcmahon -Arthur Mcmahon following, appreciate care with pain control, wound care -wound vac in place -Tylenol 1000 mg IV 400 mL/hour every 24 hours -1000 mg Tylenol oral as needed if no acetaminophen in 8 hours -s/p  cefazolin 2g IV for surgical prophylaxis -fentanyl 25-50 mcg IV q81min prn for pain greater than 5 -oxycodone 5 mg once for pain 1-4 -cardiac monitoring -diet per protocol -vital signs per floor protocol -Monitor fever curve -Monitor for bowel movements/flatus -wound care per Arthur Mcmahon -AM CBC, CMP  Metastatic bladder cancer  Right hydroureteronephrosis  Hematuria Follows with [facility]. 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 Had transurethral resection of bladder tumor on 10/31. Says Arthur Mcmahon urine is more of a normal color without clots from previously a cherry red color.   -Monitor creatinine on BMP -Monitor urine output -Monitor hemoglobin on CBC -urology f/u outpatient  Tobacco use disorder Smokes 1-2 ppd for past 30 years. -Consider Nicotine patch  Constipation Last bowel movement 3 days ago. Has some suprapubic exam on abdominal examination.  -Miralax 17 g BID -Senna daily  Chronic Hyponatermia Asymptomatic. Na 128 on admission. Since July 2022 115-132.  - AM BMP    Goals of care Patient full code currently.    FEN/GI: Per Mcmahon diet protocol Prophylaxis: SCDs  Disposition: Admit to med-tele   History of Present Illness:  Arthur Mcmahon is a 55 y.o. male presenting after Mcmahon today. Arthur Mcmahon requested medical admission.   [facility] for Arthur Mcmahon bladder cancer. Has follow up on Friday. Feels like "life slapped him in the head" as he is having escalating "problems here or there." Has been taking Arthur Mcmahon but stopped before the Mcmahon. He started Arthur Mcmahon since last admission. He had Arthur Mcmahon bladder Mcmahon found on MRI which showed a mass on Arthur Mcmahon bladder. Discharged from the  Mcmahon last Friday 11/4. Denies nausea/vomiting. Has some  constipation as well.   Endorses hematuria but no recent clots. Previously was a black cherry color and it cleared up a lot.   Has been smoking for over 30 years. He is looking to quit and would like some help with quitting. He has smoked 1-2 ppd. No alcohol use or IV drug use. He smokes THC.   Review Of Systems: Per HPI with the following additions:   Review of Systems  Constitutional:  Negative for fever.  Eyes:  Negative for visual disturbance.  Respiratory:  Negative for shortness of breath (with accerlation).   Cardiovascular:  Negative for chest pain.  Gastrointestinal:  Positive for constipation (with pain meds). Negative for abdominal pain, nausea and vomiting.  Genitourinary:  Positive for hematuria.  Skin:  Positive for wound.  Neurological:  Negative for headaches.  Psychiatric/Behavioral:  Negative for confusion.   All other systems reviewed and are negative.   Patient Active Problem List   Diagnosis Date Noted   Pressure injury of skin 07/02/2021   Hyponatremia 06/30/2021   Status post Mcmahon 06/18/2021   Ischemia of extremity 06/18/2021   PAOD (peripheral arterial occlusive disease) ([place]) 04/11/2021   Tobacco use disorder 04/08/2021   Hematuria with proteinuria 03/26/2021   Peripheral arterial disease ([place]) 03/26/2021   Foot pain, bilateral 03/26/2021   Discoloration of skin of toe 03/26/2021    Past Medical History: Past Medical History:  Diagnosis Date   Anemia    Cancer ([place])    bladder   Constipation    ETOH abuse    Hypertension    Peripheral Arthur disease ([place])    Pneumonia     Past Surgical History: Past Surgical History:  Procedure Laterality Date   ABDOMINAL AORTOGRAM W/LOWER EXTREMITY Bilateral 03/31/2021   Procedure: ABDOMINAL AORTOGRAM W/LOWER EXTREMITY;  Surgeon: [name], MD;  Location: [facility];  Service: Cardiovascular;  Laterality: Bilateral;   AMPUTATION Left 04/29/2021   Procedure: Left second Partial TOE  AMPUTATION;  Surgeon: [name], MD;  Location: [facility];  Service: Arthur;  Laterality: Left;   ENDARTERECTOMY FEMORAL Left 04/11/2021   Procedure: LEFT FEMORAL ENDARTERECTOMY;  Surgeon: [name], MD;  Location: [facility];  Service: Arthur;  Laterality: Left;   ENDARTERECTOMY FEMORAL Right 06/18/2021   Procedure: ENDARTERECTOMY RIGHT FEMORAL ARTERY;  Surgeon: [name], MD;  Location: [facility];  Service: Arthur;  Laterality: Right;   FASCIOTOMY Right 06/18/2021   Procedure: RIGHT LOWER EXTREMITY FASCIOTOMY;  Surgeon: [name], MD;  Location: [place];  Service: Arthur;  Laterality: Right;   FRACTURE Mcmahon Right    femur   INSERTION OF ILIAC STENT Left 04/11/2021   Procedure: INSERTION OF LEFT ABOVE KNEE POPLITEAL STENT;  Surgeon: [name], MD;  Location: [facility];  Service: Arthur;  Laterality: Left;   INTRAOPERATIVE ARTERIOGRAM Left 04/11/2021   Procedure: INTRA OPERATIVE ARTERIOGRAM;  Surgeon: [name], MD;  Location: [facility];  Service: Arthur;  Laterality: Left;   PATCH ANGIOPLASTY Left 04/11/2021   Procedure: PATCH ANGIOPLASTY LEFT COMMON FEMORAL ARTERY, PROFUNDOPLASTY;  Surgeon: [name], MD;  Location: [facility];  Service: Arthur;  Laterality: Left;   PATCH ANGIOPLASTY Right 06/18/2021   Procedure: PATCH ANGIOPLASTY USING Rueben Bash BIOLOGIC PATCH;  Surgeon: [name], MD;  Location: [facility];  Service: Arthur;  Laterality: Right;   THROMBECTOMY FEMORAL ARTERY Right 06/18/2021   Procedure: THROMBECTOMY RIGHT LOWER EXTREMITY;  Surgeon: Carlis Abbott,  Gwenyth Allegra, MD;  Location: [facility];  Service: Arthur;  Laterality: Right;   THROMBECTOMY FEMORAL ARTERY Right 06/19/2021   Procedure: THROMBECTOMY RIGHT LOWER EXTREMITY VIA POPITEAL APPROACH;  Surgeon: [name], MD;  Location: [facility];  Service: Arthur;  Laterality: Right;   TONSILLECTOMY     TRANSURETHRAL RESECTION OF BLADDER TUMOR N/A 07/04/2021   Procedure:  TRANSURETHRAL RESECTION OF BLADDER TUMOR (TURBT), LEFT RETROGRADE PYELOGRAM;  Surgeon: [name], MD;  Location: [facility];  Service: Urology;  Laterality: N/A;    Social History: Social History   Tobacco Use   Smoking status: Every Day    Packs/day: 0.50    Years: 30.00    Pack years: 15.00    Types: Cigarettes   Smokeless tobacco: Never  Vaping Use   Vaping Use: Never used  Substance Use Topics   Alcohol use: Not Currently   Drug use: Yes    Types: Marijuana    Comment: 2 times a week   Additional social history: Smokes marijuana occasionally. 1-2 ppd cigarettes.  Please also refer to relevant sections of EMR.  Family History: Family History  Problem Relation Age of Onset   Cancer Mother    Heart failure Mother    Cancer Father    Allergies and Medications: No Known Allergies No current facility-administered medications on file prior to encounter.   Current Outpatient Medications on File Prior to Encounter  Medication Sig Dispense Refill   acetaminophen (TYLENOL) 500 MG tablet Take 500 mg by mouth every 6 (six) hours as needed for mild pain, fever or headache.     apixaban (Arthur Mcmahon) 5 MG TABS tablet Take 1 tablet (5 mg total) by mouth 2 (two) times daily. 60 tablet 11   HYDROcodone-acetaminophen (NORCO/VICODIN) 5-325 MG tablet Take 2 tablets by mouth every 6 (six) hours as needed for severe pain.     Multiple Vitamin (MULTIVITAMIN WITH MINERALS) TABS tablet Take 1 tablet by mouth daily.     oxyCODONE-acetaminophen (PERCOCET) 10-325 MG tablet Take 1 tablet by mouth every 6 (six) hours as needed for pain. 5 tablet 0   senna-docusate (SENOKOT-S) 8.6-50 MG tablet Take 2 tablets by mouth 2 (two) times daily.      Objective: BP (!) 142/77 (BP Location: Right Arm)   Pulse 64   Temp 97.8 F (36.6 C)   Resp 19   Ht 6\' 2"  (1.88 m)   Wt 84.8 kg   SpO2 100%   BMI 24.01 kg/m  Exam: General: NAD, resting, alert, responsive to questions Eyes: No conjunctival  irritation Neck: ROM good Cardiovascular: RRR no m/r/g Respiratory: CTAB, no w/r/c Gastrointestinal: Mildly tender to palpation of suprapubic region, nondistended, soft. BS normoactive Extremities: R leg wrapped in bandage, right groin incision site with wound vac in place. Moves toes bilaterally. Sensation intact bilaterally. Derm: No rashes visualized Neuro: Alert, oriented, responsive to questions Psych: Mood appropriate  Labs and Imaging: CBC BMET  Recent Labs  Lab 07/08/21 0427 07/13/21 0750  WBC 11.3*  --   HGB 9.6* 11.6*  HCT 29.8* 34.0*  PLT 298  --    Recent Labs  Lab 07/08/21 0427 07/13/21 0750  NA 128* 128*  K 3.9 4.7  CL 98 94*  CO2 22  --   BUN 7 10  CREATININE 0.68 0.90  GLUCOSE 79 100*  CALCIUM 7.5*  --      No results found.     Gerrit Heck, MD 07/13/2021, 12:36 PM PGY-1, [place]  FPTS Upper-Level Resident Addendum   I have independently interviewed and examined the patient. I have discussed the above with the original author and agree with their documentation. My edits for correction/addition/clarification are in within the document. Please see also any attending notes.   Lyndee Hensen, DO PGY-3, [facility] 07/13/2021 3:14 PM  [place] Service pager: [phone] (text pages welcome through [place])

## 2021-07-13 NOTE — Hospital Course (Addendum)
Arthur Mcmahon is a 55 y.o. male presenting with wound VAC placement of groin incision today for ongoing lymphatic leak and debridement of necrotic lower extremity fasciotomy incisions. Past medical history includes metastatic bladder cancer with lymphadenopathy in the groin.   Necrosis of surgical incisions and lymphatic leakage s/p debridement and wound VAC placement  PAD Patient had acutely ischemic right lower extremity 06/18/2021 s/p right common femoral endarterectomy and lower extremity thrombectomy and fasciotomies. Patient had follow up with vascular where he said his incisions were looking worse and he was having drainage from groin incision. He said his right leg was very swollen. Patient had wound VAC placement on the groin incision for the ongoing lymphatic leak and debridement of necrotic lower extremity fasciotomy incisions. Contacted by vascular surgery for admission for this patient for continued medical care.  Patient has remained afebrile in the hospital. Patient wounds continued to heal, with significant wound vac drainage preventing him from going home. Wound Vac in hospital had output that was significantly higher than what the home Wound Vac's could accommodate.  Wound VAC was continued for therapeutic lymphatic drainage for quality of life.  Metastatic bladder cancer  Right hydroureteronephrosis  Hematuria [facility]. Metastatic stage IVa [id number] urothelial carcinoma of the bladder with right hydroureteronephrosis and widely metastatic lymphadenopathy of retroperitoneal, pelvic, left supraclavicular, left axilla, left para-aortic, retrocrural adenopathy. Had transurethral resection of bladder tumor on 10/31. Says his urine is more of a normal color without clots from previously a cherry red color. Patient had left clavicular lymph node biopsied, results showed metastatic carcinoma, morphologically compatible with urothelial carcinoma.  Ultimately, patient  progressed to requiring comfort care measures and was discharged to [facility].  AKI on CKD IIIb  Electrolyte derangement Patient presented with stable Hyponatremia, that worsened over admission. Patient also developed Hyperkalemia, treated with lowkelma, Hypercalcemia, when corrected for albumin, and hyperphosphatemia. Patient developed AKI with Cr rising as high as 2.49 with baseline Cr being between 0.8-0.9. AKI etiology thought to be 2/2 ATN due to intravascular volume depletion.   Anemia  Received 1 u pRBC 11/29.

## 2021-07-13 NOTE — H&P (Signed)
History and Physical Interval Note:  07/13/2021 10:05 AM  Arthur Mcmahon  has presented today for surgery, with the diagnosis of Postoperative complication of skin involving drainage from surgical wound, Right lower extremity swelling.  The various methods of treatment have been discussed with the patient and family. After consideration of risks, benefits and other options for treatment, the patient has consented to  Procedure(s): RIGHT GROIN AND RIGHT LOWER EXTREMITY INCISIONAL DEBRIDEMENT (Right) APPLICATION OF WOUND VAC (Right) as a surgical intervention.  The patient's history has been reviewed, patient examined, no change in status, stable for surgery.  I have reviewed the patient's chart and labs.  Questions were answered to the patient's satisfaction.     Arthur Mcmahon  POST OPERATIVE OFFICE NOTE       CC:  F/u for surgery   HPI:  This is a 55 y.o. male who is s/p right CFA endarterectomy with bovine pericardial patch right lower extremity thrombectomy via popliteal approach and 4 compartment fasciotomies by Dr. Carlis Abbott on 06/19/2021 secondary to acute on chronic ischemia of the right lower extremity. Prior to discharge, his heparin was converted to apixaban and Plavix discontinued. He developed serous/lymphatic drainage of right groin incision and was discharged with wound VAC in place. He presents today for wound check.   He complains of ongoing right foot pain and swelling. Had to change home VAC canister this morning that was placed yesterday (300 cc). He denies issues with LLE.   Previous left CFA endarterectomy with bovine pericaridal patch and popliteal stent placement August 2022   No Known Allergies         Current Outpatient Medications  Medication Sig Dispense Refill   acetaminophen (TYLENOL) 500 MG tablet Take 500 mg by mouth every 6 (six) hours as needed for mild pain, fever or headache.       apixaban (ELIQUIS) 5 MG TABS tablet Take 1 tablet (5 mg total) by  mouth 2 (two) times daily. 60 tablet 11   HYDROcodone-acetaminophen (NORCO) 5-325 MG tablet Take 1-2 tablets by mouth every 6 (six) hours as needed for severe pain. 20 tablet 0   Multiple Vitamin (MULTIVITAMIN WITH MINERALS) TABS tablet Take 1 tablet by mouth daily.       senna-docusate (SENOKOT-S) 8.6-50 MG tablet Take 2 tablets by mouth 2 (two) times daily.        No current facility-administered medications for this visit.       ROS:  See HPI      Vitals:    07/12/21 0913  BP: 117/84  Pulse: (!) 101  Resp: 20  Temp: (!) 97.2 F (36.2 C)  SpO2: 100%      Physical Exam:   General appearance: Awake, alert in no apparent distress Cardiac: Heart rate and rhythm are regular Respirations: Nonlabored Incisions: Right groin with wound VAC in place. Approximately 300cc output last 16-18 hours. Right lower leg incisions have necrotic skin changes, serous drainage and partial dehiscence.  Extremities: Right lower extremity is edematous to the knee level. Right forefoot is cool with intact sensation and motor function.  Skin mottled to knee.  Pulse/Doppler exam: Biphasic peroneal artery and monophasic PT Doppler signals    Assessment/Plan:  This is a 55 y.o. male who is s/p: right lower extremity thrombectomy via popliteal approach, right CFA endarterectomy and 4 compartment fasciotomies with right lower leg dehiscence and necrosis. Persistant lymphatic right drainage.  Discussed with Dr. Carlis Abbott who examined the patient.  We will plan for right groin  incision and drainage and debridement of right lower leg fasciotomy incisions tomorrow in the operating room.  The patient has not taken his morning Eliquis and he is instructed to hold this medication.  He is in agreement with this plan.   Advised regarding smoking cessation and protein consumption for adequate healing and ongoing vascular disease.   Risa Grill, PA-C [facility] [phone]   Clinic MD:  Dr. Carlis Abbott

## 2021-07-14 ENCOUNTER — Encounter (HOSPITAL_COMMUNITY): Payer: Self-pay | Admitting: Vascular Surgery

## 2021-07-14 DIAGNOSIS — Z9889 Other specified postprocedural states: Secondary | ICD-10-CM

## 2021-07-14 LAB — CBC
HCT: 29.1 % — ABNORMAL LOW (ref 39.0–52.0)
Hemoglobin: 9.6 g/dL — ABNORMAL LOW (ref 13.0–17.0)
MCH: 30.9 pg (ref 26.0–34.0)
MCHC: 33 g/dL (ref 30.0–36.0)
MCV: 93.6 fL (ref 80.0–100.0)
Platelets: 350 10*3/uL (ref 150–400)
RBC: 3.11 MIL/uL — ABNORMAL LOW (ref 4.22–5.81)
RDW: 14.4 % (ref 11.5–15.5)
WBC: 11.1 10*3/uL — ABNORMAL HIGH (ref 4.0–10.5)
nRBC: 0 % (ref 0.0–0.2)

## 2021-07-14 LAB — COMPREHENSIVE METABOLIC PANEL
ALT: 9 U/L (ref 0–44)
AST: 13 U/L — ABNORMAL LOW (ref 15–41)
Albumin: 1.8 g/dL — ABNORMAL LOW (ref 3.5–5.0)
Alkaline Phosphatase: 79 U/L (ref 38–126)
Anion gap: 5 (ref 5–15)
BUN: 10 mg/dL (ref 6–20)
CO2: 28 mmol/L (ref 22–32)
Calcium: 8 mg/dL — ABNORMAL LOW (ref 8.9–10.3)
Chloride: 94 mmol/L — ABNORMAL LOW (ref 98–111)
Creatinine, Ser: 0.87 mg/dL (ref 0.61–1.24)
GFR, Estimated: >60 mL/min (ref >60)
Glucose, Bld: 100 mg/dL — ABNORMAL HIGH (ref 70–99)
Potassium: 5.1 mmol/L (ref 3.5–5.1)
Sodium: 127 mmol/L — ABNORMAL LOW (ref 135–145)
Total Bilirubin: 0.2 mg/dL — ABNORMAL LOW (ref 0.3–1.2)
Total Protein: 5.7 g/dL — ABNORMAL LOW (ref 6.5–8.1)

## 2021-07-14 MED ORDER — ENOXAPARIN SODIUM 40 MG/0.4ML IJ SOSY
40.0000 mg | PREFILLED_SYRINGE | INTRAMUSCULAR | Status: DC
  Administered 2021-07-14: 40 mg via SUBCUTANEOUS
  Filled 2021-07-14: qty 0.4

## 2021-07-14 MED ORDER — NON FORMULARY: Status: DC

## 2021-07-14 MED ORDER — ACETAMINOPHEN 500 MG PO TABS
1000.0000 mg | ORAL_TABLET | Freq: Four times a day (QID) | ORAL | Status: DC | PRN
  Administered 2021-07-19: 1000 mg via ORAL
  Filled 2021-07-14: qty 2

## 2021-07-14 MED ORDER — DAKINS (FULL STRENGTH) SOLUTION 0.5%
1.0000 "application " | Freq: Every day | CUTANEOUS | Status: AC
  Administered 2021-07-15 – 2021-07-20 (×6): 1
  Filled 2021-07-14 (×3): qty 1000

## 2021-07-14 NOTE — Progress Notes (Signed)
FPTS Brief Progress Note  S:Patient asleep but awakes easily when MD enters room.  He reports that his right leg feels better after the RN rewrapped it to allow for better blood circulation to his right foot.  He reports that his pain is currently 7 out of 10 and requests additional pain medication.  He also asks if we can possibly coordinate a hospital bed for him at home once he discharges; he knows this is unlikely because he is uninsured but he would like to try.  O: BP 128/83 (BP Location: Right Arm)   Pulse 70   Temp 98.1 F (36.7 C) (Oral)   Resp 15   Ht 6\' 2"  (1.88 m)   Wt 84.8 kg   SpO2 100%   BMI 24.01 kg/m    A/P: - RN to administer additional pain meds already on MAR - TOC consult for DME hospital bed at home - AM CBC Hgb 9.6, down from 11.6 yesterday - Orders reviewed. - If condition changes, plan includes calling vascular surgery.   [name], MD 07/14/2021, 2:13 AM PGY-2, [facility] Night Resident  Please page [phone] with questions.

## 2021-07-14 NOTE — Progress Notes (Addendum)
RN paged attending MD regarding pt's NPO diet status.  Vascular PA in room and ok with patient having diet order.   Attending MD returned page and placed diet order.  Will continue to monitor.

## 2021-07-14 NOTE — Progress Notes (Signed)
Family Medicine Teaching Service Daily Progress Note Intern Pager: [phone]  Patient name: Arthur Mcmahon Medical record number: [id number] Date of birth: [birth year] Age: 55 y.o. Gender: male  Primary Care Provider: Shary Key, DO Consultants: Vascular Surgery Code Status: Full  Pt Overview and Major Events to Date:  11/9 Admitted for necrotic surgical wounds   Assessment and Plan:  Necrosis of surgical incision and lymphatic leakage s/p debridement and wound VAC placement  PAD Right leg rewrapped yesterday due to it being too tight and causing pain. Pain today is managed with medications. No fevers hospitalizations. Saturating in high 90s on room air. White blood cell count 11.1. 425 mL fluid charted in wound vac. Diet added this AM. 3+ pitting edema bilaterally and pulses hard to assess with the significant swelling. Wound vac in place in groin no hematoma visualized.  -d/c Morphine 41m IV q2h prn -percocet 1-2 tablet oral q4h prn -acetaminophen added -negative pressure wound therapy -change dressing bid -wound care  Bilateral Edema New from yesterday. Albumin low at 1.8. Could be related metastatic lymphatic spread as well.  -Consider RD -monitor edema  Constipation Patient has not had bowel movement, only had a little of miralax yesterday  -miralax 17 g twice daily -Senna daily  Normocytic anemia 9.6 hemoglobin today from 11.6.  Likely due to blood loss during surgery. Also has hx of hematuria with bladder cancer -Ferritin -Hemoglobin threshold likely 7  -Monitor hemoglobin on CBC  Metastatic bladder cancer  right hydroureteronephrosis  hematuria Creatinine 0.87 today. UOP 600 mL not full 24 hours.  Chronic Hyponatremia 127 today from 128.  -Monitor  FEN/GI: Regular PPx: SCDs Dispo:Home pending clinical improvement . Mar[facility] Kitchen   Subjective:  No complaints this AM  Objective: Temp:  [97.8 F (36.6 C)-98.1 F (36.7 C)] 98.1 F (36.7 C) (11/10 0557) Pulse  Rate:  [62-103] 73 (11/10 0557) Resp:  [12-24] 24 (11/10 0557) BP: (121-151)/(67-84) 123/80 (11/10 0557) SpO2:  [98 %-100 %] 99 % (11/10 0557) Weight:  [84.8 kg] 84.8 kg (11/09 0728) Physical Exam: General: NAD Cardiovascular: RRR no m/r/g Respiratory: CTAB no w/r/c Abdomen: Nontender to palpation, nondistended, BS+ Extremities: 3+ pitting edema bilaterally, right leg with compression bandage and groin incision with wound vac in place, well perfused  Laboratory: Recent Labs  Lab 07/08/21 0427 07/13/21 0750 07/14/21 0045  WBC 11.3*  --  11.1*  HGB 9.6* 11.6* 9.6*  HCT 29.8* 34.0* 29.1*  PLT 298  --  350   Recent Labs  Lab 07/08/21 0427 07/13/21 0750 07/14/21 0045  NA 128* 128* 127*  K 3.9 4.7 5.1  CL 98 94* 94*  CO2 22  --  28  BUN 7 10 10   CREATININE 0.68 0.90 0.87  CALCIUM 7.5*  --  8.0*  PROT  --   --  5.7*  BILITOT  --   --  0.2*  ALKPHOS  --   --  79  ALT  --   --  9  AST  --   --  13*  GLUCOSE 79 100* 100*     Imaging/Diagnostic Tests:   Gerrit Heck, MD 07/14/2021, 6:45 AM PGY-1, [place] Intern pager: [phone], text pages welcome

## 2021-07-14 NOTE — Progress Notes (Addendum)
Progress Note    07/14/2021 7:34 AM 1 Day Post-Op  Subjective:  no complaints. Says yesterday the ACE wrap on his right leg was too tight and hurting but once it was removed/ loosened he has felt much better   Vitals:   07/14/21 0557 07/14/21 0712  BP: 123/80 120/70  Pulse: 73 64  Resp: (!) 24 17  Temp: 98.1 F (36.7 C) 97.7 F (36.5 C)  SpO2: 99% 100%   Physical Exam: Cardiac:  regular Lungs:  non labored Incisions:  Right groin with Wound VAC to suction. 350 cc serous output. Right leg dressings c/d/i Extremities: RLE well perfused and warm. BLE edematous. Doppler PT bilaterally Neurologic: alert and oriented  CBC    Component Value Date/Time   WBC 11.1 (H) 07/14/2021 0045   RBC 3.11 (L) 07/14/2021 0045   HGB 9.6 (L) 07/14/2021 0045   HCT 29.1 (L) 07/14/2021 0045   PLT 350 07/14/2021 0045   MCV 93.6 07/14/2021 0045   MCH 30.9 07/14/2021 0045   MCHC 33.0 07/14/2021 0045   RDW 14.4 07/14/2021 0045   LYMPHSABS 0.8 07/04/2021 0453   MONOABS 0.8 07/04/2021 0453   EOSABS 0.2 07/04/2021 0453   BASOSABS 0.0 07/04/2021 0453    BMET    Component Value Date/Time   NA 127 (L) 07/14/2021 0045   K 5.1 07/14/2021 0045   CL 94 (L) 07/14/2021 0045   CO2 28 07/14/2021 0045   GLUCOSE 100 (H) 07/14/2021 0045   BUN 10 07/14/2021 0045   CREATININE 0.87 07/14/2021 0045   CALCIUM 8.0 (L) 07/14/2021 0045   GFRNONAA >60 07/14/2021 0045   GFRAA >60 12/01/2019 2208    INR    Component Value Date/Time   INR 1.0 06/18/2021 1550     Intake/Output Summary (Last 24 hours) at 07/14/2021 0734 Last data filed at 07/14/2021 0559 Gross per 24 hour  Intake 700 ml  Output 1055 ml  Net -355 ml     Assessment/Plan:  55 y.o. male is s/p  1.  Sharp excisional debridement right groin wound including skin and subcutaneous tissue with application of negative pressure wound VAC (8 cm x 2 cm x 3 cm deep) 2.  Sharp excisional debridement of right lower extremity medial fasciotomy  incision including skin, subcutaneous tissue, muscle and fascia (5 cm x 15 cm x 2 cm deep) 3.  Sharp excisional debridement of right lower extremity lateral fasciotomy including skin and subcutaneous tissue (12 cm x 3 cm x 1 cm deep) 1 Day Post-Op   Doing well post op. RLE adequately perfused 350 cc serous output from drain Right Groin wound VAC with good suction. VAC change tomorrow Elevate RLE while in bed. Keep ACE wrap on for compression Hemodynamically stable   Karoline Caldwell, PA-C [facility] [phone] 07/14/2021 7:34 AM  I have seen and evaluated the patient. I agree with the PA note as documented above.  Postop day 1 status post debridement of right groin wound with placement of negative pressure wound VAC and also debridement of bilateral lower extremity fasciotomy incisions that were necrotic.  Groin VAC output 425 mL over the last 24 hours.  He has lymphatic leak given significant lymphadenopathy from his metastatic bladder cancer.  I was at the bedside for his dressing change this afternoon and both fasciotomy wet-to-dry dressings were changed.  Dressings have a blue hue and I suspect he has Pseudomonas in the wound bed.  We will place in [place]'s wet-to-dry.  Ultimate goal would  be to get him an vacs to his lower extremities as well once the wound cleans up.  Okay to restart anticoagulation from my standpoint.  [name], MD [facility] [place] Office: [phone]

## 2021-07-14 NOTE — Progress Notes (Signed)
Pt's fasciotomy dressings changed per MD order without difficulty.  Will continue to monitor.

## 2021-07-15 DIAGNOSIS — E43 Unspecified severe protein-calorie malnutrition: Secondary | ICD-10-CM | POA: Diagnosis present

## 2021-07-15 LAB — COMPREHENSIVE METABOLIC PANEL
ALT: 7 U/L (ref 0–44)
AST: 12 U/L — ABNORMAL LOW (ref 15–41)
Albumin: 1.7 g/dL — ABNORMAL LOW (ref 3.5–5.0)
Alkaline Phosphatase: 68 U/L (ref 38–126)
Anion gap: 6 (ref 5–15)
BUN: 9 mg/dL (ref 6–20)
CO2: 26 mmol/L (ref 22–32)
Calcium: 7.8 mg/dL — ABNORMAL LOW (ref 8.9–10.3)
Chloride: 96 mmol/L — ABNORMAL LOW (ref 98–111)
Creatinine, Ser: 0.85 mg/dL (ref 0.61–1.24)
GFR, Estimated: >60 mL/min (ref >60)
Glucose, Bld: 87 mg/dL (ref 70–99)
Potassium: 4.1 mmol/L (ref 3.5–5.1)
Sodium: 128 mmol/L — ABNORMAL LOW (ref 135–145)
Total Bilirubin: <0.1 mg/dL — ABNORMAL LOW (ref 0.3–1.2)
Total Protein: 5.3 g/dL — ABNORMAL LOW (ref 6.5–8.1)

## 2021-07-15 LAB — BASIC METABOLIC PANEL
Anion gap: 8 (ref 5–15)
BUN: 8 mg/dL (ref 6–20)
CO2: 25 mmol/L (ref 22–32)
Calcium: 7.9 mg/dL — ABNORMAL LOW (ref 8.9–10.3)
Chloride: 95 mmol/L — ABNORMAL LOW (ref 98–111)
Creatinine, Ser: 0.8 mg/dL (ref 0.61–1.24)
GFR, Estimated: >60 mL/min (ref >60)
Glucose, Bld: 78 mg/dL (ref 70–99)
Potassium: 4.1 mmol/L (ref 3.5–5.1)
Sodium: 128 mmol/L — ABNORMAL LOW (ref 135–145)

## 2021-07-15 LAB — CBC
HCT: 25.5 % — ABNORMAL LOW (ref 39.0–52.0)
Hemoglobin: 8.3 g/dL — ABNORMAL LOW (ref 13.0–17.0)
MCH: 30.3 pg (ref 26.0–34.0)
MCHC: 32.5 g/dL (ref 30.0–36.0)
MCV: 93.1 fL (ref 80.0–100.0)
Platelets: 310 10*3/uL (ref 150–400)
RBC: 2.74 MIL/uL — ABNORMAL LOW (ref 4.22–5.81)
RDW: 14.6 % (ref 11.5–15.5)
WBC: 10.4 10*3/uL (ref 4.0–10.5)
nRBC: 0 % (ref 0.0–0.2)

## 2021-07-15 LAB — FERRITIN: Ferritin: 63 ng/mL (ref 24–336)

## 2021-07-15 MED ORDER — APIXABAN 5 MG PO TABS
5.0000 mg | ORAL_TABLET | Freq: Two times a day (BID) | ORAL | Status: DC
  Administered 2021-07-15 – 2021-08-08 (×49): 5 mg via ORAL
  Filled 2021-07-15 (×49): qty 1

## 2021-07-15 MED ORDER — ENSURE ENLIVE PO LIQD: 237.0000 mL | Freq: Three times a day (TID) | ORAL | Status: DC

## 2021-07-15 MED ORDER — POLYETHYLENE GLYCOL 3350 17 G PO PACK
34.0000 g | PACK | Freq: Two times a day (BID) | ORAL | Status: DC
  Administered 2021-07-15: 34 g via ORAL
  Administered 2021-07-16: 17 g via ORAL
  Administered 2021-07-16 – 2021-07-17 (×2): 34 g via ORAL
  Filled 2021-07-15 (×7): qty 2

## 2021-07-15 MED ORDER — SENNA 8.6 MG PO TABS
1.0000 | ORAL_TABLET | Freq: Two times a day (BID) | ORAL | Status: DC
  Administered 2021-07-15 – 2021-07-18 (×6): 8.6 mg via ORAL
  Filled 2021-07-15 (×9): qty 1

## 2021-07-15 MED ORDER — ENSURE ENLIVE PO LIQD
237.0000 mL | Freq: Two times a day (BID) | ORAL | Status: DC
  Administered 2021-07-15 – 2021-08-06 (×26): 237 mL via ORAL

## 2021-07-15 MED ORDER — ATORVASTATIN CALCIUM 40 MG PO TABS
40.0000 mg | ORAL_TABLET | Freq: Every day | ORAL | Status: DC
  Administered 2021-07-15 – 2021-08-08 (×25): 40 mg via ORAL
  Filled 2021-07-15 (×24): qty 1

## 2021-07-15 MED ORDER — JUVEN PO PACK
1.0000 | PACK | Freq: Two times a day (BID) | ORAL | Status: DC
  Administered 2021-07-16 – 2021-08-08 (×38): 1 via ORAL
  Filled 2021-07-15 (×33): qty 1

## 2021-07-15 MED ORDER — ADULT MULTIVITAMIN W/MINERALS CH
1.0000 | ORAL_TABLET | Freq: Every day | ORAL | Status: DC
  Administered 2021-07-16 – 2021-08-08 (×22): 1 via ORAL
  Filled 2021-07-15 (×22): qty 1

## 2021-07-15 MED ORDER — ENSURE MAX PROTEIN PO LIQD
11.0000 [oz_av] | Freq: Every day | ORAL | Status: DC
  Administered 2021-07-16 – 2021-07-17 (×2): 11 [oz_av] via ORAL
  Filled 2021-07-15 (×6): qty 330

## 2021-07-15 MED ORDER — GABAPENTIN 300 MG PO CAPS
300.0000 mg | ORAL_CAPSULE | Freq: Three times a day (TID) | ORAL | Status: DC | PRN
  Administered 2021-07-15 – 2021-07-16 (×4): 300 mg via ORAL
  Administered 2021-07-17: 600 mg via ORAL
  Filled 2021-07-15 (×4): qty 1
  Filled 2021-07-15: qty 2
  Filled 2021-07-15: qty 1

## 2021-07-15 NOTE — Evaluation (Signed)
Occupational Therapy Evaluation Patient Details Name: Arthur Mcmahon MRN: [id number] DOB: September 16, 1965 Today's Date: 07/15/2021   History of Present Illness Pt is a 55 y.o. male admitted on 07/13/21 for necrosis and lymphatic leakage of RLE.  Debridement of fasciotomy incisions and wound VAC placement of groin incision on 11/09.  PMH includes HTN, PVD, anemia, s/p right thrombectomy and 4 compartment fasciotomies on 06/19/21.   Clinical Impression   Patient admitted for the above diagnosis and additional lower extremity surgery.  PTA he lives in a rental property with 2 other roommates.  In general one of his roommates are home and can provide supportive assist as needed.  He has the needed DME.  Pain to his R lower leg is his primary deficit.  Patient had just returned to bed, and thus deferred out of bed mobility.  Currently he is needing up to [place] for bed mobility, and up to Mod A for lower body ADL.  OT issued a long handled sponge, reacher, and sock aide to increase his independence.  He is okay with a short rehab stay at a local SNF for wound care and post acute rehab, but is hoping he can return home with [facility] wound care.   OT will follow in the acute care setting to reinforce hip kit usage and maximize his functional status.       Recommendations for follow up therapy are one component of a multi-disciplinary discharge planning process, led by the attending physician.  Recommendations may be updated based on patient status, additional functional criteria and insurance authorization.   Follow Up Recommendations  Skilled nursing-short term rehab (<3 hours/day)    Assistance Recommended at Discharge Frequent or constant Supervision/Assistance  Functional Status Assessment  Patient has had a recent decline in their functional status and demonstrates the ability to make significant improvements in function in a reasonable and predictable amount of time.  Equipment Recommendations  None  recommended by OT    Recommendations for Other Services       Precautions / Restrictions Precautions Precautions: Fall Precaution Comments: wound vac right groin area Restrictions Weight Bearing Restrictions: No RLE Weight Bearing: Weight bearing as tolerated      Mobility Bed Mobility Overal bed mobility: Needs Assistance Bed Mobility: Supine to Sit;Sit to Supine     Supine to sit: HOB elevated;Supervision Sit to supine: Min assist   General bed mobility comments: R LE assist.    Transfers Overall transfer level: Needs assistance Equipment used: Rolling walker (2 wheels)   Sit to Stand: Min guard           General transfer comment: NT      Balance Overall balance assessment: Needs assistance Sitting-balance support: No upper extremity supported Sitting balance-Leahy Scale: Good Sitting balance - Comments: able to sit EOB with no physical assistance   Standing balance support: Bilateral upper extremity supported Standing balance-Leahy Scale: Poor Standing balance comment: UE support for static standing and ambulation                           ADL either performed or assessed with clinical judgement   ADL   Eating/Feeding: Independent;Bed level   Grooming: Set up;Wash/dry hands;Wash/dry face;Sitting   Upper Body Bathing: Set up;Sitting   Lower Body Bathing: Moderate assistance;Sitting/lateral leans   Upper Body Dressing : Set up;Sitting   Lower Body Dressing: Maximal assistance;Sitting/lateral leans Lower Body Dressing Details (indicate cue type and reason): difficulty reaching feet  due to [facility]   Toilet Transfer Details (indicate cue type and reason): NT                 Vision Patient Visual Report: No change from baseline       Perception Perception Perception: Not tested   Praxis Praxis Praxis: Not tested    Pertinent Vitals/Pain Pain Assessment: Faces Pain Score: 10-Worst pain ever Faces Pain Scale: Hurts even  more Pain Location: RLE Pain Descriptors / Indicators: Discomfort;Operative site guarding Pain Intervention(s): Monitored during session     Hand Dominance Right   Extremity/Trunk Assessment Upper Extremity Assessment Upper Extremity Assessment: Overall WFL for tasks assessed   Lower Extremity Assessment Lower Extremity Assessment: Defer to PT evaluation RLE Deficits / Details: limited active knee flexion and extension; did not assess stregnth due to pain LLE Deficits / Details: AROM WFL   Cervical / Trunk Assessment Cervical / Trunk Assessment: Normal   Communication Communication Communication: No difficulties   Cognition Arousal/Alertness: Awake/alert Behavior During Therapy: WFL for tasks assessed/performed Overall Cognitive Status: Within Functional Limits for tasks assessed                                 General Comments:      General Comments      Exercises     Shoulder Instructions      Home Living Family/patient expects to be discharged to:: Private residence Living Arrangements: Non-relatives/Friends Available Help at Discharge: Friend(s);Available 24 hours/day Type of Home: House Home Access: Stairs to enter [employer] of Steps: 1 Entrance Stairs-Rails: Right;Left Home Layout: One level     Bathroom Shower/Tub: Teacher, early years/pre: Handicapped height Bathroom Accessibility: Yes How Accessible: Accessible via walker Home Equipment: Grab bars - tub/shower;Shower Land (2 wheels)   Additional Comments: Pt reports 1-2 roommates one is home all the time, but is concerned about daily dressing changes.      Prior Functioning/Environment Prior Level of Function : Needs assist             Mobility Comments: Ambulated in home with RW ADLs Comments: Pt reports he is mostly idependent in ADLs. Since he was last in the hospital on 06/19/21 he needs some assistance with bathing.        OT  Problem List: Impaired balance (sitting and/or standing);Decreased knowledge of use of DME or AE;Pain      OT Treatment/Interventions: Self-care/ADL training;Patient/family education;Therapeutic activities;Balance training;DME and/or AE instruction    OT Goals(Current goals can be found in the care plan section) Acute Rehab OT Goals Patient Stated Goal: Lessen my pain and move better OT Goal Formulation: With patient Time For Goal Achievement: 07/29/21 Potential to Achieve Goals: Good ADL Goals Pt Will Perform Grooming: with modified independence;sitting;standing Pt Will Perform Lower Body Bathing: with modified independence;sit to/from stand Pt Will Perform Lower Body Dressing: with modified independence;with adaptive equipment;sit to/from stand Pt Will Transfer to Toilet: ambulating;regular height toilet;with set-up  OT Frequency: Min 2X/week   Barriers to D/C:            Co-evaluation              AM-PAC OT "6 Clicks" Daily Activity     Outcome Measure Help from another person eating meals?: None Help from another person taking care of personal grooming?: None Help from another person toileting, which includes using toliet, bedpan, or urinal?: A Lot Help from another  person bathing (including washing, rinsing, drying)?: A Lot Help from another person to put on and taking off regular upper body clothing?: None Help from another person to put on and taking off regular lower body clothing?: A Lot 6 Click Score: 18   End of Session Nurse Communication: Mobility status  Activity Tolerance: Patient limited by pain Patient left: in bed;with call bell/phone within reach  OT Visit Diagnosis: Unsteadiness on feet (R26.81);Other abnormalities of gait and mobility (R26.89);Pain Pain - Right/Left: Right Pain - part of body: Leg                Time: 5844-6520 OT Time Calculation (min): 19 min Charges:  OT General Charges $OT Visit: 1 Visit OT Evaluation $OT Eval Moderate  Complexity: 1 Mod  07/15/2021  RP, OTR/L  Acute Rehabilitation Services  Office:  [phone]   Metta Clines 07/15/2021, 2:43 PM

## 2021-07-15 NOTE — Progress Notes (Signed)
FPTS Brief Progress Note  S: Patient laying in bed awake watching TV.  He reports taking oxycodone every 4 hours but still has significant pain, he rates it as 8 or 9 out of 10.  His right foot still cool, painful, and tingling despite dressing change earlier today.  O: BP 126/81 (BP Location: Right Arm)   Pulse (!) 39   Temp 98.6 F (37 C) (Oral)   Resp 18   Ht 6\' 2"  (1.88 m)   Wt 84.8 kg   SpO2 100%   BMI 24.01 kg/m     A/P:  Necrosis of surgical incision and lymphatic leakage s/p debridement and wound VAC placement  PAD Now postop day #1.  Continues to have pain, taking oxycodone every 4. - Add gabapentin 300-600 mg 3 times daily as needed   Bilateral Edema 3+ pitting edema at feet, 1+ pitting edema up to hip  Sacral Wound RN found on exam and placed wound care consult.   Hypoalbuminemia Likely contributing to bilateral lower extremity edema.  Consult dietitian for recommendations.   - Orders reviewed. Labs for AM ordered, which was adjusted as needed.  - If condition changes, plan includes contacting vascular surgery regarding wound management.   [name], MD 07/15/2021, 3:41 AM PGY-2, [facility] Night Resident  Please page [phone] with questions.

## 2021-07-15 NOTE — Progress Notes (Signed)
Patient reports he has not had a bowel movement since admission/believes last BM was 11/7. Is currently taking Miralax and senna. Endorses that he takes colace at home for constipated stool and it is effective. BS present and active x 4, no tenderness or guarding noted. Patient report he is passing gas. Denies n/v.

## 2021-07-15 NOTE — Progress Notes (Addendum)
Family Medicine Teaching Service Daily Progress Note Intern Pager: [phone]  Patient name: Arthur Mcmahon Medical record number: [id number] Date of birth: 02-01-1966 Age: 55 y.o. Gender: male  Primary Care Provider: Shary Key, DO Consultants: Vascular surgery Code Status: Full  Pt Overview and Major Events to Date:  11/9 admitted for necrotic surgical wounds  Assessment and Plan:  55 year old male presenting with woundgroin incision and ongoing lymphatic leak and debridement of necrotic lower extremity fasciotomy incisions.  Past medical history includes metastatic bladder cancer with lymphadenopathy groin.  Necrosis of surgical incisions and lymphatic leakage s/p debridement and wound VAC placement POD 2  PAD White blood cell count down to 10.4 from 11.1.  Afebrile overnight.  Vital signs stable.  Saturating well on room air.  Wound VAC in place with 200 mL output charted. Did complain of some pain as well this AM. Says he can walk but feels numb right now so not wanting to. -percocet q4h prn -gabapentin 300-600 mg TID -acetaminophen 1000 mg q6h prn -dressing changes -apixaban -PT/OT  Bilateral Edema Albumin still low at 1.7. Edema today improved from yesterday. Still pitting  -RD  Constipation No BM yet -miralax  doubled -senna bid  Normocytic anemia Hemoglobin 8.3 from 9.6. Ferritin 63. Restarted  -threshold for transfusion 7 -CBC  Metastatic bladder cancer  right hydroureteronephrosis  hematuria Creatinine 0.85 today.  Urine output 0.5 mL/kg/hr -Monitor Cr and UOP  Chronic Hyponatremia 128 from 127 today. -Monitor  FEN/GI: Regular Diet PPx: home elquis Dispo:Home pending clinical improvement  likely will need wound care through weekend per vascular  Subjective:  Complains of some pain in leg. No BM yet  Objective: Temp:  [97.6 F (36.4 C)-98.6 F (37 C)] 98.6 F (37 C) (11/11 0258) Pulse Rate:  [39-71] 39 (11/11 0258) Resp:  [12-18] 18 (11/11  0258) BP: (113-126)/(67-81) 126/81 (11/11 0258) SpO2:  [97 %-100 %] 100 % (11/11 0258) Physical Exam: General: NAD Head: NCAT, left supraclavicular node enlarged Cardiovascular: RRR no m/r/g Respiratory: CTAB no w/r/c Abdomen: Nondistended, nontender to palpation, BS present Extremities: 2+ pitting edema bilaterally to mid shin, unable to assess pulse with swelling, good cap refill  Laboratory: Recent Labs  Lab 07/13/21 0750 07/14/21 0045 07/15/21 0213  WBC  --  11.1* 10.4  HGB 11.6* 9.6* 8.3*  HCT 34.0* 29.1* 25.5*  PLT  --  350 310   Recent Labs  Lab 07/13/21 0750 07/14/21 0045 07/15/21 0213  NA 128* 127* 128*  K 4.7 5.1 4.1  CL 94* 94* 96*  CO2  --  28 26  BUN 10 10 9   CREATININE 0.90 0.87 0.85  CALCIUM  --  8.0* 7.8*  PROT  --  5.7* 5.3*  BILITOT  --  0.2* <0.1*  ALKPHOS  --  79 68  ALT  --  9 7  AST  --  13* 12*  GLUCOSE 100* 100* 87    Imaging/Diagnostic Tests:   Gerrit Heck, MD 07/15/2021, 6:52 AM PGY-1, [place] Intern pager: [phone], text pages welcome

## 2021-07-15 NOTE — Anesthesia Postprocedure Evaluation (Signed)
Anesthesia Post Note  Patient: DEMARIS LEAVELL  Procedure(s) Performed: DEBREDIEMENT OF RIGHT GROIN AND RIGHT LATERAL AND MEDIAL LOWER EXTREMITY FASCIOTOMY SITES (Right: Leg Lower) APPLICATION OF WOUND VAC OF RIGHT GROIN (Right: Groin)     Patient location during evaluation: PACU Anesthesia Type: General Level of consciousness: patient cooperative and awake Pain management: pain level controlled Vital Signs Assessment: post-procedure vital signs reviewed and stable Respiratory status: spontaneous breathing, nonlabored ventilation, respiratory function stable and patient connected to nasal cannula oxygen Cardiovascular status: blood pressure returned to baseline and stable Postop Assessment: no apparent nausea or vomiting Anesthetic complications: no   No notable events documented.  Last Vitals:  Vitals:   07/15/21 0258 07/15/21 0755  BP: 126/81 116/70  Pulse: (!) 39 74  Resp: 18 18  Temp: 37 C 36.8 C  SpO2: 100% 98%    Last Pain:  Vitals:   07/15/21 0755  TempSrc: Oral  PainSc:                  Donnita Farina

## 2021-07-15 NOTE — Consult Note (Signed)
[facility] Nurse Consult Note: Patient receiving care in [place] Reason for Consult: Sacral wound Wound type: 2 unstageable wounds on the coccyx separated by an island of skin. Both are very small but yellow with a moderate amount of drainage on the foam dressing.  Pressure Injury POA: Yes Measurement: 3 x 2.5 total area Wound bed: Yellow Periwound: intact Dressing procedure/placement/frequency: Clean the sacral area with no rinse cleanser, pat dry then place a cut to fit piece of Aquacel Advantage over the wound and secure with foam dressing. Change Aquacel daily. May use NS to loosen if sticks and may use the foam dressing up to 3 days unless soiled.   Monitor the wound area(s) for worsening of condition such as: Signs/symptoms of infection, increase in size, development of or worsening of odor, development of pain, or increased pain at the affected locations.   Notify the medical team if any of these develop.  Thank you for the consult. [place] nurse will not follow at this time.   Please re-consult the [facility] team if needed.  [name] [name], MSN, RN, [place], [name], [facility] Wound Treatment Associate Pager [phone]

## 2021-07-15 NOTE — Progress Notes (Signed)
Noted pt will need home VAC - [employer] forms have been placed on chart- MD will need to sign both order form as well as Medicaid form, CM will follow up with pt to fill out the [facility] application and then fax all forms to [employer] to process for approval- Pt was active with [facility] under charity program PTA- will need new orders for HHRN-wound VAC instructions.

## 2021-07-15 NOTE — Evaluation (Signed)
Physical Therapy Evaluation Patient Details Name: Arthur Mcmahon MRN: [id number] DOB: 06/26/1966 Today's Date: 07/15/2021  History of Present Illness  Pt is a 55 y.o. male admitted on 07/13/21 for necrosis and lymphatic leakage of RLE.  Debridement of fasciotomy incisions and wound VAC placement of groin incision on 11/09.  PMH includes HTN, PVD, anemia, s/p right thrombectomy and 4 compartment fasciotomies on 06/19/21.  Clinical Impression  Pt presents with pain, decreased functional mobility, decreased activity tolerance, and decreased strength secondary to above. PTA, pt was mostly modified independent in ADLs and walked around his house with a RW. Currently, pt only ambulates 3 feet in the room with RW and min guard. Pt is currently limited by pain but is motivated. Once pain improves, expect steady progress. Pt would benefit from further acute care PT to address above deficits and functional mobility goals. Recommending SNF after discharge to maximize independence, pt confidence, and safety before returning home. Pt currently agrees with this recommendation but feels that in a few days he may be comfortable enough to go home - pending progression.        Recommendations for follow up therapy are one component of a multi-disciplinary discharge planning process, led by the attending physician.  Recommendations may be updated based on patient status, additional functional criteria and insurance authorization.  Follow Up Recommendations Skilled nursing-short term rehab (<3 hours/day) (TBD depending on progression)    Assistance Recommended at Discharge Intermittent Supervision/Assistance  Functional Status Assessment Patient has had a recent decline in their functional status and demonstrates the ability to make significant improvements in function in a reasonable and predictable amount of time.  Equipment Recommendations  Hospital bed    Recommendations for Other Services       Precautions  / Restrictions Precautions Precautions: Fall Precaution Comments: wound vac right groin area Restrictions Weight Bearing Restrictions: No      Mobility  Bed Mobility Overal bed mobility: Needs Assistance Bed Mobility: Supine to Sit     Supine to sit: HOB elevated;Supervision     General bed mobility comments: able to transfer to EOB with increased time and effort to move R LE; used bedrails; supervision for safety    Transfers Overall transfer level: Needs assistance Equipment used: Rolling walker (2 wheels)   Sit to Stand: Min guard           General transfer comment: increased time to stand from EOB with RW; min gaurd for safety; good use of UE and RW    Ambulation/Gait Ambulation/Gait assistance: Min guard Gait Distance (Feet): 3 Feet Assistive device: Rolling walker (2 wheels) Gait Pattern/deviations: Step-to pattern;Decreased stride length;Decreased weight shift to right;Antalgic;Knee flexed in stance - right Gait velocity: decreased     General Gait Details: Pt ambulated from EOB to recliner; decreased weight bearing through RLE  Stairs            Wheelchair Mobility    Modified Rankin (Stroke Patients Only)       Balance Overall balance assessment: Needs assistance Sitting-balance support: No upper extremity supported Sitting balance-Leahy Scale: Good Sitting balance - Comments: able to sit EOB with no physical assistance   Standing balance support: Bilateral upper extremity supported Standing balance-Leahy Scale: Poor Standing balance comment: UE support for static standing and ambulation                             Pertinent Vitals/Pain Pain Assessment: 0-10 Pain Score: 10-Worst pain  ever Pain Location: RLE Pain Descriptors / Indicators: Discomfort;Grimacing;Operative site guarding Pain Intervention(s): Limited activity within patient's tolerance;Monitored during session    [place] expects to be  discharged to:: Skilled nursing facility (TBD - possibly home depending on progression) Living Arrangements: Non-relatives/Friends Available Help at Discharge: Friend(s);Available 24 hours/day Type of Home: House Home Access: Stairs to enter Entrance Stairs-Rails: Psychiatric nurse of Steps: 1   Home Layout: One level Home Equipment: Grab bars - tub/shower;Shower Land (2 wheels) Additional Comments: Pt reports 1-2 roommates one is home all the time.    Prior Function Prior Level of Function : Needs assist             Mobility Comments: Ambulated in home with RW ADLs Comments: Pt reports he is mostly idependent in ADLs. Since he was last in the hospital on 06/19/21 he needs some assistance with bathing.     Hand Dominance        Extremity/Trunk Assessment   Upper Extremity Assessment Upper Extremity Assessment: Overall WFL for tasks assessed    Lower Extremity Assessment RLE Deficits / Details: limited active knee flexion and extension; did not assess stregnth due to pain LLE Deficits / Details: AROM WFL       Communication      Cognition Arousal/Alertness: Awake/alert Behavior During Therapy: WFL for tasks assessed/performed Overall Cognitive Status: Within Functional Limits for tasks assessed                                         General Comments General comments (skin integrity, edema, etc.): Initally pt did not want to move due to pain but agreed with minimal encouragement and said he was willing to try. Pleasant and patient.    Exercises     Assessment/Plan    PT Assessment Patient needs continued PT services  PT Problem List Decreased strength;Decreased mobility;Decreased range of motion;Decreased activity tolerance;Pain;Decreased balance       PT Treatment Interventions DME instruction;Therapeutic activities;Gait training;Therapeutic exercise;Patient/family education;Stair training;Balance  training;Functional mobility training    PT Goals (Current goals can be found in the Care Plan section)  Acute Rehab PT Goals Patient Stated Goal: to go home PT Goal Formulation: With patient Time For Goal Achievement: 07/29/21 Potential to Achieve Goals: Good    Frequency Min 3X/week   Barriers to discharge        Co-evaluation               AM-PAC PT "6 Clicks" Mobility  Outcome Measure Help needed turning from your back to your side while in a flat bed without using bedrails?: A Little Help needed moving from lying on your back to sitting on the side of a flat bed without using bedrails?: A Little Help needed moving to and from a bed to a chair (including a wheelchair)?: A Little Help needed standing up from a chair using your arms (e.g., wheelchair or bedside chair)?: A Little Help needed to walk in hospital room?: Total Help needed climbing 3-5 steps with a railing? : Total 6 Click Score: 14    End of Session Equipment Utilized During Treatment: Gait belt Activity Tolerance: Patient tolerated treatment well Patient left: in chair;with call bell/phone within reach;with chair alarm set Nurse Communication: Mobility status PT Visit Diagnosis: Other abnormalities of gait and mobility (R26.89);Muscle weakness (generalized) (M62.81);Unsteadiness on feet (R26.81);Difficulty in walking, not elsewhere classified (R26.2);Pain Pain -  Right/Left: Right Pain - part of body: Leg    Time: 1030-1101 PT Time Calculation (min) (ACUTE ONLY): 31 min   Charges:   PT Evaluation $PT Eval Moderate Complexity: 1 [address] SPT   [name] 07/15/2021, 11:54 AM

## 2021-07-15 NOTE — Progress Notes (Addendum)
Initial Nutrition Assessment  DOCUMENTATION CODES:  Severe malnutrition in context of chronic illness  INTERVENTION:  Add Ensure Plus High Protein po BID, each supplement provides 350 kcal and 20 grams of protein.   Add Ensure Max po daily, each supplement provides 150 kcal and 30 grams of protein.    Add 1 packet Juven BID, each packet provides 95 calories, 2.5 grams of protein (collagen), and 9.8 grams of carbohydrate (3 grams sugar); also contains 7 grams of L-arginine and L-glutamine, 300 mg vitamin C, 15 mg vitamin E, 1.2 mcg vitamin B-12, 9.5 mg zinc, 200 mg calcium, and 1.5 g  Calcium Beta-hydroxy-Beta-methylbutyrate to support wound healing   Add MVI with minerals daily.  Encourage PO intake.   NUTRITION DIAGNOSIS:  Severe Malnutrition related to chronic illness (PVD) as evidenced by percent weight loss, severe muscle depletion.  GOAL:  Patient will meet greater than or equal to 90% of their needs  MONITOR:  PO intake, Supplement acceptance, Labs, Weight trends, Skin, I & O's  REASON FOR ASSESSMENT:  Consult Assessment of nutrition requirement/status, Poor PO, Wound healing  ASSESSMENT:  55 yo male with a PMH of metastatic bladder cancer with lymphadenopathy in the groin, HTN, EtOH abuse, PVD, and anemia who presents with wound VAC placement of groin incision today for ongoing lymphatic leak and debridement of necrotic lower extremity fasciotomy incisions. 11/9 - wound vac placed on R groin  Spoke with pt at bedside. He reports that he has been not eating very well due to decreased appetite and early satiety, which led to him losing weight.  Per Epic, pt has lost ~12 lbs (6%) in the last 2.5 months, which is severe and significant for the time frame.  Pt open to drinking Ensure to help with repletion.  Recommend adding Ensure BID, Ensure Max daily, and MVI with minerals daily. Also recommend Juven BID.  Supplements: Ensure TID  Medications: reviewed; miralax BID,  Senokot, Percocet PO PRN (given twice today)  Labs: reviewed; Na 128 (L)  NUTRITION - FOCUSED PHYSICAL EXAM: Flowsheet Row Most Recent Value  Orbital Region Moderate depletion  Upper Arm Region Moderate depletion  Thoracic and Lumbar Region Moderate depletion  Buccal Region Moderate depletion  Temple Region Severe depletion  Clavicle Bone Region Severe depletion  Clavicle and Acromion Bone Region Severe depletion  Scapular Bone Region Moderate depletion  Dorsal Hand Moderate depletion  Patellar Region Moderate depletion  Anterior Thigh Region Moderate depletion  Posterior Calf Region Moderate depletion  Edema (RD Assessment) Severe  [BLE]  Hair Reviewed  Eyes Reviewed  Mouth Reviewed  Skin Reviewed  Nails Reviewed   Diet Order:   Diet Order             Diet regular Room service appropriate? Yes with Assist; Fluid consistency: Thin  Diet effective now                  EDUCATION NEEDS:  Education needs have been addressed  Skin:  Skin Assessment: Skin Integrity Issues: Skin Integrity Issues:: Wound VAC Wound Vac: R groin  Last BM:  07/11/21  Height:  Ht Readings from Last 1 Encounters:  07/13/21 6\' 2"  (1.88 m)   Weight:  Wt Readings from Last 1 Encounters:  07/13/21 84.8 kg   BMI:  Body mass index is 24.01 kg/m.  Estimated Nutritional Needs:  Kcal:  2300-2500 Protein:  110-130 grams Fluid:  >2.3 L  Derrel Nip, RD, LDN (she/her/hers) Clinical Inpatient Dietitian RD Pager/After-Hours/Weekend Pager # in [place]

## 2021-07-15 NOTE — Progress Notes (Addendum)
  Progress Note    07/15/2021 7:48 AM 2 Days Post-Op  Subjective:  no new complaints   Vitals:   07/14/21 2302 07/15/21 0258  BP: 118/67 126/81  Pulse: 67 (!) 39  Resp: 12 18  Temp: 98.2 F (36.8 C) 98.6 F (37 C)  SpO2: 97% 100%   Physical Exam: Lungs:  non labored Incisions:  vac with good seal R groin Extremities:  R PT brisk by doppler Neurologic: a&O  CBC    Component Value Date/Time   WBC 10.4 07/15/2021 0213   RBC 2.74 (L) 07/15/2021 0213   HGB 8.3 (L) 07/15/2021 0213   HCT 25.5 (L) 07/15/2021 0213   PLT 310 07/15/2021 0213   MCV 93.1 07/15/2021 0213   MCH 30.3 07/15/2021 0213   MCHC 32.5 07/15/2021 0213   RDW 14.6 07/15/2021 0213   LYMPHSABS 0.8 07/04/2021 0453   MONOABS 0.8 07/04/2021 0453   EOSABS 0.2 07/04/2021 0453   BASOSABS 0.0 07/04/2021 0453    BMET    Component Value Date/Time   NA 128 (L) 07/15/2021 0632   K 4.1 07/15/2021 0632   CL 95 (L) 07/15/2021 0632   CO2 25 07/15/2021 0632   GLUCOSE 78 07/15/2021 0632   BUN 8 07/15/2021 0632   CREATININE 0.80 07/15/2021 0632   CALCIUM 7.9 (L) 07/15/2021 0632   GFRNONAA >60 07/15/2021 0632   GFRAA >60 12/01/2019 2208    INR    Component Value Date/Time   INR 1.0 06/18/2021 1550     Intake/Output Summary (Last 24 hours) at 07/15/2021 0748 Last data filed at 07/15/2021 0228 Gross per 24 hour  Intake --  Output 1300 ml  Net -1300 ml     Assessment/Plan:  55 y.o. male is s/p R groin and fasciotomies debridement 2 Days Post-Op   R foot well perfused with brisk PT by doppler Daily wet to dry with dakins to fasciotomy incisions WOC to change groin vac today TOC consulted for home vac  Dagoberto Ligas, PA-C [facility] [phone] 07/15/2021 7:48 AM  I have seen and evaluated the patient. I agree with the PA note as documented above. Postop day 2 status post debridement of right groin wound with placement of negative pressure wound VAC and also debridement of  bilateral lower extremity fasciotomy incisions that were necrotic.  Groin VAC output 200 mL over the last 24 hours.  He has lymphatic leak in right groin given significant lymphadenopathy from his metastatic bladder cancer.  Appreciate wound care changing right groin vac today.  Bilateral fasciotomy dressings changed at bedside today and appears to have Pseudomonas with blue hue and now placed in wet-to-dry with Dakin's.  Brisk PT signal in the right foot. Can restart anticoagulation from my standpoint.  Anticipate will need continued wound care through the weekend.  [name], MD [facility] [place] Office: [phone]

## 2021-07-15 NOTE — Consult Note (Signed)
[place] Nurse Consult Note: Patient receiving care in [place] Reason for Consult: Vac dressing change Wound type: right groin; lymphatic leak Pressure Injury POA: NA Measurement: 7cm x 2.5 cm x 3 cm Wound bed: Open wound with beefy red tissue and yellow fibrinous tissue. Friable.  Drainage (amount, consistency, odor) lymphatic in canister Periwound: intact Dressing procedure/placement/frequency: 1 piece black foam removed. Skin prepped with 51M skin barrier wipes. 1 piece black foam used, drape applied, immediate suction obtained at 125 mmHg.  Simple dressing change that bedside RN should be able to complete. Will turn over to them but the [place] team is available to the patient and medical team if needed. Please re-consult.  Monitor the wound area(s) for worsening of condition such as: Signs/symptoms of infection, increase in size, development of or worsening of odor, development of pain, or increased pain at the affected locations.   Notify the medical team if any of these develop.  Thank you for the consult. [place] nurse will not follow at this time.   Please re-consult the [place] team if needed.  [name] [name], MSN, RN, [place], [name], [facility] Wound Treatment Associate Pager [phone]

## 2021-07-16 LAB — CBC
HCT: 24.8 % — ABNORMAL LOW (ref 39.0–52.0)
Hemoglobin: 8 g/dL — ABNORMAL LOW (ref 13.0–17.0)
MCH: 30.5 pg (ref 26.0–34.0)
MCHC: 32.3 g/dL (ref 30.0–36.0)
MCV: 94.7 fL (ref 80.0–100.0)
Platelets: 298 10*3/uL (ref 150–400)
RBC: 2.62 MIL/uL — ABNORMAL LOW (ref 4.22–5.81)
RDW: 14.5 % (ref 11.5–15.5)
WBC: 9.2 10*3/uL (ref 4.0–10.5)
nRBC: 0 % (ref 0.0–0.2)

## 2021-07-16 LAB — BASIC METABOLIC PANEL
Anion gap: 4 — ABNORMAL LOW (ref 5–15)
BUN: 8 mg/dL (ref 6–20)
CO2: 29 mmol/L (ref 22–32)
Calcium: 7.9 mg/dL — ABNORMAL LOW (ref 8.9–10.3)
Chloride: 94 mmol/L — ABNORMAL LOW (ref 98–111)
Creatinine, Ser: 0.84 mg/dL (ref 0.61–1.24)
GFR, Estimated: >60 mL/min (ref >60)
Glucose, Bld: 82 mg/dL (ref 70–99)
Potassium: 4.1 mmol/L (ref 3.5–5.1)
Sodium: 127 mmol/L — ABNORMAL LOW (ref 135–145)

## 2021-07-16 MED ORDER — QUETIAPINE FUMARATE 25 MG PO TABS
25.0000 mg | ORAL_TABLET | Freq: Every day | ORAL | Status: DC
  Administered 2021-07-16 – 2021-07-30 (×15): 25 mg via ORAL
  Filled 2021-07-16 (×15): qty 1

## 2021-07-16 MED ORDER — DOCUSATE SODIUM 100 MG PO CAPS
100.0000 mg | ORAL_CAPSULE | Freq: Two times a day (BID) | ORAL | Status: DC | PRN
  Administered 2021-07-26 – 2021-07-29 (×4): 100 mg via ORAL
  Filled 2021-07-16 (×5): qty 1

## 2021-07-16 NOTE — Progress Notes (Addendum)
  Progress Note    07/16/2021 8:15 AM 3 Days Post-Op  Subjective:  no complaints overnight   Vitals:   07/16/21 0430 07/16/21 0735  BP: 114/75 116/75  Pulse: 76 81  Resp: 17 20  Temp: 98.4 F (36.9 C) 98.5 F (36.9 C)  SpO2: 100% 100%   Physical Exam: Lungs:  non labored Incisions:  R groin vac with good seal; serous output in cannister RLE fasciotomy incisions still with greenish-blue drainage Extremities:  brisk R PT by doppler Neurologic: A&O  CBC    Component Value Date/Time   WBC 9.2 07/16/2021 0206   RBC 2.62 (L) 07/16/2021 0206   HGB 8.0 (L) 07/16/2021 0206   HCT 24.8 (L) 07/16/2021 0206   PLT 298 07/16/2021 0206   MCV 94.7 07/16/2021 0206   MCH 30.5 07/16/2021 0206   MCHC 32.3 07/16/2021 0206   RDW 14.5 07/16/2021 0206   LYMPHSABS 0.8 07/04/2021 0453   MONOABS 0.8 07/04/2021 0453   EOSABS 0.2 07/04/2021 0453   BASOSABS 0.0 07/04/2021 0453    BMET    Component Value Date/Time   NA 127 (L) 07/16/2021 0206   K 4.1 07/16/2021 0206   CL 94 (L) 07/16/2021 0206   CO2 29 07/16/2021 0206   GLUCOSE 82 07/16/2021 0206   BUN 8 07/16/2021 0206   CREATININE 0.84 07/16/2021 0206   CALCIUM 7.9 (L) 07/16/2021 0206   GFRNONAA >60 07/16/2021 0206   GFRAA >60 12/01/2019 2208    INR    Component Value Date/Time   INR 1.0 06/18/2021 1550     Intake/Output Summary (Last 24 hours) at 07/16/2021 0815 Last data filed at 07/16/2021 0438 Gross per 24 hour  Intake --  Output 1650 ml  Net -1650 ml     Assessment/Plan:  55 y.o. male is s/p debridement of R groin and fasciotomy incisions 3 Days Post-Op   RLE well perfused with brisk PT signal by doppler Continue vac R groin for lymphatic leak; change vac again Monday Fasciotomy dressing changed; still with greenish-blue drainage however improved from yesterday; continue Dakins over the weekend Continue work with therapy teams  Dagoberto Ligas, PA-C [facility] [phone] 07/16/2021 8:15 AM   I have interviewed and examined patient with PA and agree with assessment and plan above.   Jayleigh Notarianni C. Donzetta Matters, MD [facility] [place] Office: [phone] Pager: [phone]

## 2021-07-16 NOTE — Progress Notes (Signed)
Family Medicine Teaching Service Daily Progress Note Intern Pager: [phone]  Patient name: Arthur Mcmahon Medical record number: [id number] Date of birth: September 25, 1965 Age: 55 y.o. Gender: male  Primary Care Provider: Shary Key, DO Consultants: Vascular surgery Code Status: Full  Pt Overview and Major Events to Date:  11/9 Admitted s/p vascular surgery for right common femoral endarterectomy, lower extremity thrombectomy and fasciotomies  Assessment and Plan: [name] is a 55 year old male admitted postoperatively by vascular surgery after  11/9 operation.  He is currently postop day #3.  PMH includes metastatic bladder cancer with associated right hydroureteronephrosis and hematuria, bilateral edema, constipation, normocytic anemia, chronic hyponatremia, severe protein calorie malnutrition, tobacco use, PAOD.  Necrosis of surgical incisions and lymphatic leakage s/p debridement and wound VAC placement  PAD Currently postop day #3.  Vascular surgery managing wound, currently no indication for return to OR.  Continue pain regimen Tylenol, gabapentin, Percocet.  Dressing changes per vascular surgery.   -Continue work with PT/OT -TOC consult for hospital bed DME - Continue apixaban 5 mg twice daily   Bilateral Edema Moderately improved.  We will continue to monitor.   Constipation Patient reports Colace works well for him at home.  Continue MiraLAX, senna. - Add Colace twice daily as needed - Add soapsuds enema as needed   Normocytic anemia Hemoglobin this morning stable at 8.0.  We will continue to monitor closely, as Eliquis was restarted yesterday morning. - A.m. CBC daily   Metastatic bladder cancer  right hydroureteronephrosis  hematuria Creatinine stable.  Documented urine output 1200.   Chronic Hyponatremia Sodium stable.   FEN/GI: Regular PPx: Full dose Eliquis Dispo:Home with home health  pending clinical improvement . Barriers include continued therapy,  dressing changes, vascular surgery involvement.   Subjective:  Patient lying in bed awake watching TV.  Reports gabapentin is helping his pain quite a bit.  Hoping to get up work with PT again today.  Is eager to work with [facility], hopeful for DME hospital bed when he goes home.  Objective: Temp:  [98.4 F (36.9 C)-98.8 F (37.1 C)] 98.5 F (36.9 C) (11/12 0735) Pulse Rate:  [68-90] 81 (11/12 0735) Resp:  [12-20] 18 (11/12 1001) BP: (106-125)/(70-79) 116/75 (11/12 0735) SpO2:  [94 %-100 %] 100 % (11/12 0735) Physical Exam: General: Awake, alert, no acute distress Cardiovascular: Right toes and right distal thigh cool to touch, other extremities warm Respiratory: Speaking in full sentences, no respiratory distress Extremities: Dressing in place over RLE  Laboratory: Recent Labs  Lab 07/14/21 0045 07/15/21 0213 07/16/21 0206  WBC 11.1* 10.4 9.2  HGB 9.6* 8.3* 8.0*  HCT 29.1* 25.5* 24.8*  PLT 350 310 298   Recent Labs  Lab 07/14/21 0045 07/15/21 0213 07/15/21 0632 07/16/21 0206  NA 127* 128* 128* 127*  K 5.1 4.1 4.1 4.1  CL 94* 96* 95* 94*  CO2 28 26 25 29   BUN 10 9 8 8   CREATININE 0.87 0.85 0.80 0.84  CALCIUM 8.0* 7.8* 7.9* 7.9*  PROT 5.7* 5.3*  --   --   BILITOT 0.2* <0.1*  --   --   ALKPHOS 79 68  --   --   ALT 9 7  --   --   AST 13* 12*  --   --   GLUCOSE 100* 87 78 82    Imaging/Diagnostic Tests: None last 24 hours.   [name], MD 07/16/2021, 10:25 AM PGY-2, [place] Intern pager: [phone], text  pages welcome

## 2021-07-16 NOTE — Progress Notes (Signed)
FPTS Brief Progress Note  S: Patient awake and laying in bed watching TV.  He appears in good spirits and reports that the gabapentin that was added on last night is doing well.  He has no complaints.  O: BP 106/70 (BP Location: Right Arm)   Pulse 68   Temp 98.6 F (37 C) (Oral)   Resp 12   Ht 6\' 2"  (1.88 m)   Wt 84.8 kg   SpO2 100%   BMI 24.01 kg/m    A/P:  Necrosis of surgical incisions and lymphatic leakage s/p debridement and wound VAC placement POD 2  PAD Patient reports gabapentin is working quite well for him and giving him great pain relief.  No complaints at this time.    Constipation RN notes patient takes Colace at home and it works well for him. - Colace twice daily as needed - Soapsuds enema as needed  - Orders reviewed. Labs for AM ordered, which was adjusted as needed.    [name], MD 07/16/2021, 2:32 AM PGY-2, [place] Family [facility] Night Resident  Please page [phone] with questions.

## 2021-07-17 LAB — CBC
HCT: 25.8 % — ABNORMAL LOW (ref 39.0–52.0)
Hemoglobin: 8.4 g/dL — ABNORMAL LOW (ref 13.0–17.0)
MCH: 30.8 pg (ref 26.0–34.0)
MCHC: 32.6 g/dL (ref 30.0–36.0)
MCV: 94.5 fL (ref 80.0–100.0)
Platelets: 343 10*3/uL (ref 150–400)
RBC: 2.73 MIL/uL — ABNORMAL LOW (ref 4.22–5.81)
RDW: 14.5 % (ref 11.5–15.5)
WBC: 8.1 10*3/uL (ref 4.0–10.5)
nRBC: 0 % (ref 0.0–0.2)

## 2021-07-17 NOTE — TOC Initial Note (Addendum)
Transition of Care [facility]) - Initial/Assessment Note    Patient Details  Name: Arthur Mcmahon MRN: [id number] Date of Birth: 06-23-1966  Transition of Care [facility]) CM/SW Contact:    Milas Gain, [place] Phone Number: 07/17/2021, 10:40 AM  Clinical Narrative:                  CSW received consult for possible SNF placement at time of discharge. CSW spoke with patient regarding PT recommendation of SNF placement at time of discharge. Patient reports he comes from home with 2 roommates. CSW discussed barriers to placement with patient. Patient confirmed he currently has no insurance. Patient confirmed unable to pay privately for SNF. Patient expressed understanding of PT recommendation and declined SNF placement at time of discharge.  Patient is interested in [facility] services.Patient reports he was previously receiving [place] services. Patient confirmed that one of his roommates can help provide 24/7 supervision at home.No further questions reported at this time. CSW to continue to follow and assist with discharge planning needs.   Expected Discharge Plan: [place] Barriers to Discharge: Continued Medical Work up   Patient Goals and CMS Choice Patient states their goals for this hospitalization and ongoing recovery are:: [facility] CMS Medicare.gov Compare Post Acute Care list provided to:: Patient Choice offered to / list presented to : Patient  Expected Discharge Plan and Services Expected Discharge Plan: [place] In-house Referral: Clinical Social Work                                            Prior Living Arrangements/Services   Lives with:: Self, Roommate (lives with 2 Roomates) Patient language and need for interpreter reviewed:: Yes Do you feel safe going back to the place where you live?: Yes      Need for Family Participation in Patient Care: Yes (Comment) Care giver support system in place?: Yes (comment)   Criminal Activity/Legal  Involvement Pertinent to Current Situation/Hospitalization: No - Comment as needed  Activities of Daily Living Home Assistive Devices/Equipment: Eyeglasses, Walker (specify type), Wound Vac ADL Screening (condition at time of admission) Patient's cognitive ability adequate to safely complete daily activities?: Yes Is the patient deaf or have difficulty hearing?: No Does the patient have difficulty seeing, even when wearing glasses/contacts?: No Does the patient have difficulty concentrating, remembering, or making decisions?: No Patient able to express need for assistance with ADLs?: Yes Does the patient have difficulty dressing or bathing?: Yes Independently performs ADLs?: No Communication: Independent Dressing (OT): Needs assistance Is this a change from baseline?: Pre-admission baseline Grooming: Needs assistance Is this a change from baseline?: Pre-admission baseline Feeding: Independent Bathing: Needs assistance Is this a change from baseline?: Pre-admission baseline Toileting: Needs assistance Is this a change from baseline?: Pre-admission baseline In/Out Bed: Independent with device (comment) Walks in Home: Independent with device (comment) Does the patient have difficulty walking or climbing stairs?: Yes Weakness of Legs: Both Weakness of Arms/Hands: None  Permission Sought/Granted Permission sought to share information with : Case Manager, Family Supports, Customer service manager Permission granted to share information with : Yes, Verbal Permission Granted  Share Information with NAME: Jenny Reichmann  Permission granted to share info w AGENCY: HH  Permission granted to share info w Relationship: spouse  Permission granted to share info w Contact Information: Jenny Reichmann [phone]  Emotional Assessment   Attitude/Demeanor/Rapport: Gracious Affect (  typically observed): Calm Orientation: : Oriented to Self, Oriented to Place, Oriented to  Time, Oriented to  Situation Alcohol / Substance Use: Not Applicable Psych Involvement: No (comment)  Admission diagnosis:  Necrosis of surgical wound ([place]) [G86.76, I96] Patient Active Problem List   Diagnosis Date Noted   Protein-calorie malnutrition, severe 07/15/2021   Malignant neoplasm of urinary bladder ([place])    Constipation    Encounter for postoperative wound care    Pressure injury of skin 07/02/2021   Hyponatremia 06/30/2021   Status post surgery 06/18/2021   Ischemia of extremity 06/18/2021   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 [place] [address] Phone: [phone] Fax: [phone]     Social Determinants of Health (SDOH) Interventions    Readmission Risk Interventions Readmission Risk Prevention Plan 07/04/2021 06/22/2021  Post Dischage Appt - Complete  Medication Screening - Complete  Transportation Screening Complete Complete  PCP or Specialist Appt within 5-7 Days Complete -  Home Care Screening Complete -  Medication Review (RN CM) Complete -  Some recent data might be hidden

## 2021-07-17 NOTE — Progress Notes (Signed)
FPTS Brief Note Reviewed patient's vitals, recent notes.  Vitals:   07/17/21 0309 07/17/21 1919  BP: 108/69 110/74  Pulse: 86 79  Resp: 20 18  Temp: 98.6 F (37 C) 98.5 F (36.9 C)  SpO2: 100% 99%   At this time, no change in plan from day progress note.  [name], MD Page [phone] with questions about this patient.

## 2021-07-17 NOTE — Care Management (Signed)
07-17-21 1256 Case Manager spoke with patient and he is currently active with [facility]) for RN services via Letter of Guarantee (LOG) Charity. Case Manager spoke with [facility] for [facility] and the patient will need a LOG to include PT for home Patient states he lives with two roommates; one works and the other could possibly assist with wound care. Patient has durable medical equipment (DME) rolling walker in the home. Patient utilizes the [facility] for PCP needs and uses [facility] [place] for medications. Case Manager reached out to [place] with [facility]) regarding wound vac for home use. Awaiting call back.

## 2021-07-17 NOTE — Progress Notes (Addendum)
Family Medicine Teaching Service Daily Progress Note Intern Pager: [phone]  Patient name: Arthur Mcmahon Medical record number: [id number] Date of birth: Feb 14, 1966 Age: 55 y.o. Gender: male  Primary Care Provider: Shary Key, DO Consultants: Vascular surgery Code Status: Full code  Pt Overview and Major Events to Date:  11/9 Admitted s/p vascular surgery for right common femoral endarterectomy, lower extremity thrombectomy and fasciotomies  Assessment and Plan: Arthur Mcmahon is a 55 year old male admitted postoperatively by vascular surgery after 11/9 operation.  He is currently postop day #4.  PMH includes metastatic bladder cancer with associated right hydroureteronephrosis and hematuria, bilateral edema, constipation, normocytic anemia, chronic hyponatremia, severe protein calorie malnutrition, tobacco use, PAOD.  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  Patient reports pain is controlled on current regimen and feel that he needs a dose soon at the time of my exam. Patient states he is able to move his right foot but not like he could before. He reports breathing well. Wound vac appears to be operating well for the wound in his right groin area.  - continue Tylenol q6 hrs PRN  - vasc surgery consulted, continue with wound vac and antimicrobial solution for cleaning per recommendations  -gabapentin 300-0600mg  TID PRN  Percocet every 4 hours PRN  -continue miralax & senna  for bowel regimen    Bilateral Edema Edema mostly in right LE.  - monitor for signs of cellulitis or DVT    Constipation Continue miralax and senna both twice daily  Added one time soap suds enema 11/13  Normocytic anemia Improved to 8.4 from 8.0 previously   Metastatic bladder cancer  right hydroureteronephrosis  hematuria Creatinine stable.    Hyponatremia Appears to be stable Na has ranged between 127-128.    FEN/GI: Regular PPx: Full dose Eliquis  Dispo:Home with home health  pending clinical improvement . Barriers include continued therapy, dressing changes, vascular surgery involvement.   Subjective:  Patient that he has been having trouble sleeping but otherwise feels ok. Patient is looking forward to going home soon.   Objective: Temp:  [98.1 F (36.7 C)-98.9 F (37.2 C)] 98.6 F (37 C) (11/13 0309) Pulse Rate:  [74-86] 86 (11/13 0309) Resp:  [15-20] 20 (11/13 0309) BP: (108-119)/(69-77) 108/69 (11/13 0309) SpO2:  [92 %-100 %] 100 % (11/13 0309)  Physical Exam: General: frail appearing male in NAD sitting upright in bed,alert, watching television   Cardiovascular: RRR  Respiratory: CTAB without respiratory distress, stable on RA  Abdomen: soft, bowel sounds present, right groin wound vac in place  Extremities: RLE edematous compared to left, able to move toes on both feet, 1+pitting, wrapped in ACE wrap that is clean and dry   Laboratory: Recent Labs  Lab 07/15/21 0213 07/16/21 0206 07/17/21 0249  WBC 10.4 9.2 8.1  HGB 8.3* 8.0* 8.4*  HCT 25.5* 24.8* 25.8*  PLT 310 298 343   Recent Labs  Lab 07/14/21 0045 07/15/21 0213 07/15/21 0632 07/16/21 0206  NA 127* 128* 128* 127*  K 5.1 4.1 4.1 4.1  CL 94* 96* 95* 94*  CO2 28 26 25 29   BUN 10 9 8 8   CREATININE 0.87 0.85 0.80 0.84  CALCIUM 8.0* 7.8* 7.9* 7.9*  PROT 5.7* 5.3*  --   --   BILITOT 0.2* <0.1*  --   --   ALKPHOS 79 68  --   --   ALT 9 7  --   --  AST 13* 12*  --   --   GLUCOSE 100* 87 78 82    Imaging/Diagnostic Tests: None last 24 hours.   Eulis Foster, MD 07/17/2021, 6:44 AM PGY-3, [place] Intern pager: [phone], text pages welcome

## 2021-07-17 NOTE — Progress Notes (Addendum)
  Progress Note    07/17/2021 8:19 AM 4 Days Post-Op  Subjective:  no new complaints   Vitals:   07/17/21 0014 07/17/21 0309  BP: 119/77 108/69  Pulse: 79 86  Resp: 17 20  Temp: 98.1 F (36.7 C) 98.6 F (37 C)  SpO2: 96% 100%   Physical Exam: Lungs:  non labored Incisions:  R groin vac with good seal; fasciotomy incisions still with some greenish blue drainage Extremities: brisk PT signal by doppler Neurologic: A&O  CBC    Component Value Date/Time   WBC 8.1 07/17/2021 0249   RBC 2.73 (L) 07/17/2021 0249   HGB 8.4 (L) 07/17/2021 0249   HCT 25.8 (L) 07/17/2021 0249   PLT 343 07/17/2021 0249   MCV 94.5 07/17/2021 0249   MCH 30.8 07/17/2021 0249   MCHC 32.6 07/17/2021 0249   RDW 14.5 07/17/2021 0249   LYMPHSABS 0.8 07/04/2021 0453   MONOABS 0.8 07/04/2021 0453   EOSABS 0.2 07/04/2021 0453   BASOSABS 0.0 07/04/2021 0453    BMET    Component Value Date/Time   NA 127 (L) 07/16/2021 0206   K 4.1 07/16/2021 0206   CL 94 (L) 07/16/2021 0206   CO2 29 07/16/2021 0206   GLUCOSE 82 07/16/2021 0206   BUN 8 07/16/2021 0206   CREATININE 0.84 07/16/2021 0206   CALCIUM 7.9 (L) 07/16/2021 0206   GFRNONAA >60 07/16/2021 0206   GFRAA >60 12/01/2019 2208    INR    Component Value Date/Time   INR 1.0 06/18/2021 1550     Intake/Output Summary (Last 24 hours) at 07/17/2021 0819 Last data filed at 07/17/2021 0600 Gross per 24 hour  Intake 360 ml  Output 2275 ml  Net -1915 ml     Assessment/Plan:  55 y.o. male is s/p debridement R groin and fasciotomy 4 Days Post-Op   RLE well perfused Pseudomonas like discharge from fasciotomy incisions improving but still present R groin vac change tomorrow with WOC Therapy teams recommending SNF   Dagoberto Ligas, PA-C [facility] [phone] 07/17/2021 8:19 AM   I have interviewed and examined patient with PA and agree with assessment and plan above.   Ernestina Joe C. Donzetta Matters, MD [facility] [place] Office: [phone] Pager: [phone]

## 2021-07-17 NOTE — Progress Notes (Signed)
Patient had a large BM recently, refused soap suds enema at this time.

## 2021-07-17 NOTE — Progress Notes (Signed)
FPTS Brief Note Reviewed patient's vitals, recent notes.  Vitals:   07/16/21 1953 07/17/21 0014  BP: 110/72 119/77  Pulse: 79 79  Resp: 17 17  Temp: 98.9 F (37.2 C) 98.1 F (36.7 C)  SpO2: 96% 96%   At this time, no change in plan from day progress note.   Eulis Foster, MD Page [phone] with questions about this patient.

## 2021-07-18 LAB — CBC
HCT: 26.6 % — ABNORMAL LOW (ref 39.0–52.0)
Hemoglobin: 8.5 g/dL — ABNORMAL LOW (ref 13.0–17.0)
MCH: 30 pg (ref 26.0–34.0)
MCHC: 32 g/dL (ref 30.0–36.0)
MCV: 94 fL (ref 80.0–100.0)
Platelets: 364 10*3/uL (ref 150–400)
RBC: 2.83 MIL/uL — ABNORMAL LOW (ref 4.22–5.81)
RDW: 14.6 % (ref 11.5–15.5)
WBC: 8.5 10*3/uL (ref 4.0–10.5)
nRBC: 0 % (ref 0.0–0.2)

## 2021-07-18 MED ORDER — GABAPENTIN 300 MG PO CAPS
300.0000 mg | ORAL_CAPSULE | Freq: Three times a day (TID) | ORAL | Status: DC
Start: 2021-07-18 — End: 2021-07-18

## 2021-07-18 MED ORDER — GABAPENTIN 400 MG PO CAPS
400.0000 mg | ORAL_CAPSULE | Freq: Three times a day (TID) | ORAL | Status: DC
  Administered 2021-07-18 – 2021-07-30 (×37): 400 mg via ORAL
  Filled 2021-07-18 (×37): qty 1

## 2021-07-18 NOTE — Progress Notes (Signed)
Physical Therapy Treatment Patient Details Name: Arthur Mcmahon MRN: [id number] DOB: Feb 05, 1966 Today's Date: 07/18/2021   History of Present Illness Pt is a 55 y.o. male admitted on 07/13/21 for necrosis and lymphatic leakage of RLE.  Debridement of fasciotomy incisions and wound VAC placement of groin incision on 11/09.  PMH includes HTN, PVD, anemia, s/p right thrombectomy and 4 compartment fasciotomies on 06/19/21.    PT Comments    The pt was able to demo good progress with mobility this session, completing all bed mobility and sit-stand and stand-pivot transfers without physical assist or LOB. The pt required use of bed features (HOB elevated and bed rails) to achieve sitting EOB, but was then able to stand with use of RW and take steps to recliner. The pt demos poor wt acceptance to RLE and reports increased pain with short bout of standing (therefore declining further attempts at ambulation). We discussed verbally how to manage the single step the pt has to enter his house, but he will benefit from physical practice prior to anticipated return home.     Recommendations for follow up therapy are one component of a multi-disciplinary discharge planning process, led by the attending physician.  Recommendations may be updated based on patient status, additional functional criteria and insurance authorization.  Follow Up Recommendations  Home health PT     Assistance Recommended at Discharge [facility] bed    Recommendations for Other Services       Precautions / Restrictions Precautions Precautions: Fall Precaution Comments: wound vac right groin area Restrictions Weight Bearing Restrictions: Yes RLE Weight Bearing: Weight bearing as tolerated     Mobility  Bed Mobility Overal bed mobility: Needs Assistance Bed Mobility: Supine to Sit     Supine to sit: HOB elevated;Supervision     General bed mobility  comments: supervision for safety, increased time and use of bed rail/HOB elevated    Transfers Overall transfer level: Needs assistance Equipment used: Rolling walker (2 wheels) Transfers: Sit to/from Stand;Bed to chair/wheelchair/BSC Sit to Stand: Min guard Stand pivot transfers: Min guard   Step pivot transfers: Min guard     General transfer comment: pt able to power up to standing and take small lateral steps to recliner with minG for safety and BUE support on RW. no LOB but minimal wt bearing on RLE due to pain    Ambulation/Gait               General Gait Details: pt declined further ambulation at this time, stating he wanted to elevate RLE for a little while prior to trying to walk again after transfer      Balance Overall balance assessment: Needs assistance Sitting-balance support: No upper extremity supported Sitting balance-Leahy Scale: Good Sitting balance - Comments: able to sit EOB with no physical assistance   Standing balance support: Bilateral upper extremity supported Standing balance-Leahy Scale: Poor Standing balance comment: UE support on RW for static and dynamic stance                            Cognition Arousal/Alertness: Awake/alert Behavior During Therapy: WFL for tasks assessed/performed Overall Cognitive Status: Within Functional Limits for tasks assessed                                 General Comments: pt agreeabel to all suggestions and able  to problem solve with therapist regarding mobility at home        Exercises      General Comments General comments (skin integrity, edema, etc.): VSS on RA      Pertinent Vitals/Pain Pain Assessment: 0-10 Pain Score: 4  Pain Location: RLE Pain Descriptors / Indicators: Discomfort;Operative site guarding Pain Intervention(s): Limited activity within patient's tolerance;Monitored during session;Repositioned;Patient requesting pain meds-RN notified     PT Goals  (current goals can now be found in the care plan section) Acute Rehab PT Goals Patient Stated Goal: to go home PT Goal Formulation: With patient Time For Goal Achievement: 07/29/21 Potential to Achieve Goals: Good Progress towards PT goals: Progressing toward goals    Frequency    Min 3X/week      PT Plan Discharge plan needs to be updated       AM-PAC PT "6 Clicks" Mobility   Outcome Measure  Help needed turning from your back to your side while in a flat bed without using bedrails?: A Little Help needed moving from lying on your back to sitting on the side of a flat bed without using bedrails?: A Little Help needed moving to and from a bed to a chair (including a wheelchair)?: A Little Help needed standing up from a chair using your arms (e.g., wheelchair or bedside chair)?: A Little Help needed to walk in hospital room?: A Little Help needed climbing 3-5 steps with a railing? : A Lot 6 Click Score: 17    End of Session Equipment Utilized During Treatment: Gait belt Activity Tolerance: Patient tolerated treatment well Patient left: in chair;with call bell/phone within reach;with chair alarm set Nurse Communication: Mobility status;Patient requests pain meds PT Visit Diagnosis: Other abnormalities of gait and mobility (R26.89);Muscle weakness (generalized) (M62.81);Unsteadiness on feet (R26.81);Difficulty in walking, not elsewhere classified (R26.2);Pain Pain - Right/Left: Right Pain - part of body: Leg     Time: 3568-6168 PT Time Calculation (min) (ACUTE ONLY): 22 min  Charges:  $Therapeutic Activity: 8-22 mins                     West Carbo, PT, DPT   Acute Rehabilitation Department Pager #: [phone]   Sandra Cockayne 07/18/2021, 4:53 PM

## 2021-07-18 NOTE — Progress Notes (Addendum)
[facility] [place]  Subjective  - pain is still pretty bad.   Objective 111/73 83 98.6 F (37 C) (Oral) 17 99%  Intake/Output Summary (Last 24 hours) at 07/18/2021 0729 Last data filed at 07/18/2021 0500 Gross per 24 hour  Intake 360 ml  Output 1950 ml  Net -1590 ml   Right groin vac to good seal, will be changed by WOC today Fasciotomy with greenish blue drainage.  Wet to dry with Dakins solution     PT doppler signals intact Lungs non labored breathing   Assessment/Planning: POD # 5  55 y.o. male is s/p debridement R groin and fasciotomy  Fasciotomy sites with Pseudomonas like discharge continue Dakins WOC for vac change today Pending discharge    [name] 07/18/2021 7:29 AM --  Laboratory Lab Results: Recent Labs    07/17/21 0249 07/18/21 0027  WBC 8.1 8.5  HGB 8.4* 8.5*  HCT 25.8* 26.6*  PLT 343 364   BMET Recent Labs    07/16/21 0206  NA 127*  K 4.1  CL 94*  CO2 29  GLUCOSE 82  BUN 8  CREATININE 0.84  CALCIUM 7.9*    COAG Lab Results  Component Value Date   INR 1.0 06/18/2021   INR 1.1 04/11/2021   INR 0.9 03/22/2021   No results found for: PTT  I have seen and evaluated the patient. I agree with the PA note as documented above.  Lower extremity fasciotomy wounds have cleaned up nicely through the weekend with Dakin's given evidence of Pseudomonas.  Does have some eschar forming on the medial fasciotomy that we will have to watch.  Due for VAC change of the right groin today given lymphatic leak.  400 mL output yesterday.  Good doppler signal right foot.  [name], MD [facility] [place] Office: [phone]

## 2021-07-18 NOTE — Progress Notes (Signed)
FPTS Brief Note Reviewed patient's vitals, recent notes.  Vitals:   07/18/21 1325 07/18/21 2024  BP: 127/79 123/81  Pulse: 88 86  Resp: 18 16  Temp: 98.4 F (36.9 C) 98.5 F (36.9 C)  SpO2: 98% 100%   At this time, no change in plan from day progress note.  [name], MD Page [phone] with questions about this patient.

## 2021-07-18 NOTE — TOC Progression Note (Signed)
Transition of Care (TOC) - Progression Note  Marvetta Gibbons RN, BSN Transitions of Care Unit 4E- RN Case Manager See Treatment Team for direct phone #    Patient Details  Name: KAYLIN SCHELLENBERG MRN: [id number] Date of Birth: 08-14-1966  Transition of [facility]) CM/SW Contact  Dahlia Client, Romeo Rabon, RN Phone Number: 07/18/2021, 3:15 PM  Clinical Narrative:    Damaris Schooner with Ramond Marrow at [facility] to confirm [facility] services- per Ramond Marrow pt had [facility] prior to admit for home wound VAC needs- pt has [employer] home VAC under charity program- confirmed with Olivia Mackie at [facility] this AM approved 07/07/21- Per Ramond Marrow they can continue to service pt for [facility] needs and add HHPT under charity program.   Spoke with pt at bedside to confirm [facility] services with him, noted pt has home VAC at the bedside- will need to charge home [facility] prior to discharge as bedside RN will need to change pt over to home pump. Pt states he will have transportation home, pt also reports that he still has some of the Eliquis at home that he was given back in Oct under the 30 day free card- he also reports that someone has been to the bedside to assist with paperwork regarding his medications but is unsure if the paperwork was for Eliquis- explained to pt that he would need to apply for pt. Assistance for his Eliquis needs and to be sure to f/u with that with his PCP.  Discussed DME with pt- per pt he has shower chair and RW at home- requesting a BSC- will ask for DME order and arrange with [employer] under charity- pt asked about hospital bed- however hospital bed is not something covered under charity program and pt reports he would not be able to pay anything out of pocket towards renting one.   TOC to continue to follow    Expected Discharge Plan: [place] Barriers to Discharge: Continued Medical Work up  Expected Discharge Plan and Services Expected Discharge Plan: [place] In-house Referral: Clinical Social Work Discharge  Planning Services: CM Consult Post Acute Care Choice: Home Health, Resumption of Svcs/PTA Provider Living arrangements for the past 2 months: Single Family Home                 DME Arranged: Vac, Bedside commode DME Agency: [employer], Soyla Murphy Date DME Agency Contacted: 07/18/21 Time DME Agency Contacted: 1100 Representative spoke with at DME Agency: Olivia Mackie w/ [employer] HH Arranged: RN, PT [facility] Agency: [place] ([place]) Date [place]: 07/18/21 Time [place]: 1000 Representative spoke with at [place]: [place] ([place]) Interventions    Readmission Risk Interventions Readmission Risk Prevention Plan 07/04/2021 06/22/2021  Post Dischage Appt - Complete  Medication Screening - Complete  Transportation Screening Complete Complete  PCP or Specialist Appt within 5-7 Days Complete -  Home Care Screening Complete -  Medication Review (RN CM) Complete -  Some recent data might be hidden

## 2021-07-18 NOTE — Progress Notes (Signed)
Family Medicine Teaching Service Daily Progress Note Intern Pager: [phone]  Patient name: Arthur Mcmahon Medical record number: [id number] Date of birth: [birth year] Age: 55 y.o. Gender: male  Primary Care Provider: Shary Key, DO Consultants: Vascular surgery Code Status: Full  Pt Overview and Major Events to Date:  11/9     Admitted s/p vascular surgery for right common femoral endarterectomy, lower extremity thrombectomy and fasciotomies  Assessment and Plan:  Arthur Mcmahon is a 55 year old male admitted postoperatively by vascular surgery after 11/9 operation.  He is currently postop day #5.  PMH includes metastatic bladder cancer with associated right hydroureteronephrosis and hematuria, bilateral edema, constipation, normocytic anemia, chronic hyponatremia, severe protein calorie malnutrition, tobacco use, PAD.   Necrosis of surgical incisions and lymphatic leakage s/p debridement and wound VAC placement  PAD Currently postop day #5, patient reports pain is 7-8/10, and was not well controlled overnight, as he only got one dose of his gabapentin overnight, scheduled for TID. R. Groin vac and leg wound changed today with WOC. -Continue tylenol q 6 hrs PRN -Vasc surgery following, continue with wound vac and dokins solution for cleaning, per recommendations -Gabapentin 400 mg TID scheduled -Percocet 5-325 mg q 4  hrs PRN (will drop oxy dowm as days continue, and increase the gabapentin as needed for pain control) -Therapy teams recommend SNF, but patient may want to go home with home health.    Bilateral Edema Right leg fresh bandage change change today, reported worse edema in RLE in previous notes, unable to assess today.  Constipation -Continue Miralax & senna for bowel regimen  Normocytic anemia Hgb 8.5 today from 8.4 yesterday, stable.  Metastatic bladder cancer  right hydroureteronephrosis  hematuria Creatinine stable at 0.84 from 0.80 yesterday -Oncology  appointment on 18th. (Call Wednsday 11/16 about if he can be seen, or will they come to hospital), See what his recommendations are for getting home.  -Patient has urology appointment scheduled for 08/18/21 at 12:30 PM  Hyponatremia Na stable today at 127 today (127, 128, 127)  FEN/GI: Regular Diet PPx: Full dose eliquis Dispo:Home with home health  pending clinical improvement . Barriers include therapy, dressing changes, vascular surgery involvement.   Subjective:  Patient resting in bed, complaining of poorly controlled leg pain overnight. Only received one dose of his gabapentin.   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   Laboratory: Recent Labs  Lab 07/16/21 0206 07/17/21 0249 07/18/21 0027  WBC 9.2 8.1 8.5  HGB 8.0* 8.4* 8.5*  HCT 24.8* 25.8* 26.6*  PLT 298 343 364   Recent Labs  Lab 07/14/21 0045 07/15/21 0213 07/15/21 0632 07/16/21 0206  NA 127* 128* 128* 127*  K 5.1 4.1 4.1 4.1  CL 94* 96* 95* 94*  CO2 28 26 25 29   BUN 10 9 8 8   CREATININE 0.87 0.85 0.80 0.84  CALCIUM 8.0* 7.8* 7.9* 7.9*  PROT 5.7* 5.3*  --   --   BILITOT 0.2* <0.1*  --   --   ALKPHOS 79 68  --   --  ALT 9 7  --   --   AST 13* 12*  --   --   GLUCOSE 100* 87 78 82      Imaging/Diagnostic Tests:   Holley Bouche, MD 07/18/2021, 7:48 AM PGY-1, [place] Intern pager: [phone], text pages welcome

## 2021-07-18 NOTE — Progress Notes (Signed)
Mobility Specialist: Progress Note   07/18/21 1722  Mobility  Activity Transferred:  Chair to bed  Level of Assistance Minimal assist, patient does 75% or more  Assistive Device Front wheel walker  Distance Ambulated (ft) 4 ft  Mobility Out of bed to chair with meals  Mobility Response Tolerated well  Mobility performed by Mobility specialist  $Mobility charge 1 Mobility   Pt required minA to stand from chair and was contact guard during transfer. Pt back to bed with call bell and phone at his side. Bed alarm is on.  [facility] Rika Daughdrill Mobility Specialist Mobility Specialist Phone: [phone]

## 2021-07-19 LAB — CBC WITH DIFFERENTIAL/PLATELET
Abs Immature Granulocytes: 0.11 10*3/uL — ABNORMAL HIGH (ref 0.00–0.07)
Basophils Absolute: 0 10*3/uL (ref 0.0–0.1)
Basophils Relative: 0 %
Eosinophils Absolute: 0.2 10*3/uL (ref 0.0–0.5)
Eosinophils Relative: 2 %
HCT: 30 % — ABNORMAL LOW (ref 39.0–52.0)
Hemoglobin: 9.6 g/dL — ABNORMAL LOW (ref 13.0–17.0)
Immature Granulocytes: 1 %
Lymphocytes Relative: 10 %
Lymphs Abs: 0.9 10*3/uL (ref 0.7–4.0)
MCH: 30.2 pg (ref 26.0–34.0)
MCHC: 32 g/dL (ref 30.0–36.0)
MCV: 94.3 fL (ref 80.0–100.0)
Monocytes Absolute: 0.5 10*3/uL (ref 0.1–1.0)
Monocytes Relative: 5 %
Neutro Abs: 7.3 10*3/uL (ref 1.7–7.7)
Neutrophils Relative %: 82 %
Platelets: 474 10*3/uL — ABNORMAL HIGH (ref 150–400)
RBC: 3.18 MIL/uL — ABNORMAL LOW (ref 4.22–5.81)
RDW: 14.5 % (ref 11.5–15.5)
WBC: 9 10*3/uL (ref 4.0–10.5)
nRBC: 0 % (ref 0.0–0.2)

## 2021-07-19 LAB — BASIC METABOLIC PANEL
Anion gap: 13 (ref 5–15)
BUN: 14 mg/dL (ref 6–20)
CO2: 25 mmol/L (ref 22–32)
Calcium: 8.1 mg/dL — ABNORMAL LOW (ref 8.9–10.3)
Chloride: 88 mmol/L — ABNORMAL LOW (ref 98–111)
Creatinine, Ser: 1.01 mg/dL (ref 0.61–1.24)
GFR, Estimated: >60 mL/min (ref >60)
Glucose, Bld: 135 mg/dL — ABNORMAL HIGH (ref 70–99)
Potassium: 4.1 mmol/L (ref 3.5–5.1)
Sodium: 126 mmol/L — ABNORMAL LOW (ref 135–145)

## 2021-07-19 NOTE — Progress Notes (Signed)
[facility]   Subjective  -pain seems reasonably well controlled.   Objective (!) 141/87 94 98.1 F (36.7 C) (Oral) 18 91%  Intake/Output Summary (Last 24 hours) at 07/19/2021 0848 Last data filed at 07/19/2021 0800 Gross per 24 hour  Intake 0 ml  Output 1350 ml  Net -1350 ml    VAC to right groin wound with serous output Wet-to-dry dressings to right lower extremity fasciotomies with Dakin's  Laboratory Lab Results: Recent Labs    07/18/21 0027 07/19/21 0800  WBC 8.5 9.0  HGB 8.5* 9.6*  HCT 26.6* 30.0*  PLT 364 474*   BMET No results for input(s): NA, K, CL, CO2, GLUCOSE, BUN, CREATININE, CALCIUM in the last 72 hours.  COAG Lab Results  Component Value Date   INR 1.0 06/18/2021   INR 1.1 04/11/2021   INR 0.9 03/22/2021   No results found for: PTT  Assessment/Planning:  55 year old male with complex history of acute on chronic limb ischemia of the right lower extremity requiring femoral endarterectomy as well as fasciotomies during previous admission.  Has had a number of wound issues in the setting of metastatic bladder cancer including lymphatic leak in the right groin and breakdown and necrosis of his fasciotomy incisions.  Plan is VAC change to the right groin today and should be back on a Monday Wednesday Friday schedule.  VAC output is 400 mL in the last 24 hours.  Will need vac for discharge home.  Continue Dakin's wet-to-dry to fasciotomies.  These have cleaned up nicely through the weekend but still need very meticulous wound care.  I discussed may be best served with wet-to-dry with Dakin's at discharge to his fasciotomies for ongoing wound care.  Sounds like he has no support at home so difficult social situation.  [name] 07/19/2021 8:48 AM --

## 2021-07-19 NOTE — Progress Notes (Signed)
Physical Therapy Treatment Patient Details Name: Arthur Mcmahon MRN: [id number] DOB: 01-26-1966 Today's Date: 07/19/2021   History of Present Illness Pt is a 55 y.o. male admitted on 07/13/21 for necrosis and lymphatic leakage of RLE.  Debridement of fasciotomy incisions and wound VAC placement of groin incision on 11/09.  PMH includes HTN, PVD, anemia, s/p right thrombectomy and 4 compartment fasciotomies on 06/19/21.    PT Comments    Pt is progressing well with mobility. Pt was able to progress with ambulation today, but continues to be limited by pain and fatigue in the RLE. Pt would benefit from further acute care PT services to continue to progress ambulation and increase activity tolerance. Recommend HH PT after discharge to continue to increase functional mobility and maximize independence.      Recommendations for follow up therapy are one component of a multi-disciplinary discharge planning process, led by the attending physician.  Recommendations may be updated based on patient status, additional functional criteria and insurance authorization.  Follow Up Recommendations  Home health PT     Assistance Recommended at Discharge [facility] bed    Recommendations for Other Services       Precautions / Restrictions Precautions Precautions: Fall Precaution Comments: wound vac right groin area Restrictions Weight Bearing Restrictions: Yes RLE Weight Bearing: Weight bearing as tolerated     Mobility  Bed Mobility Overal bed mobility: Modified Independent Bed Mobility: Supine to Sit     Supine to sit: Modified independent (Device/Increase time);HOB elevated Sit to supine: Modified independent (Device/Increase time)   General bed mobility comments: able to go supine to sit EOB with use of bedrails and increased time    Transfers Overall transfer level: Needs assistance Equipment used: Rolling walker (2  wheels) Transfers: Sit to/from Stand Sit to Stand: Min guard          General transfer comment: able to stand from EOB and from recliner 2 times with increased time; some failed attempts but pt successful after adjusting feet and scooting closer to edge of chair; good use of UE; min gaurd for safety    Ambulation/Gait Ambulation/Gait assistance: Min guard Gait Distance (Feet): 12 Feet (+2ft, +80ft) Assistive device: Rolling walker (2 wheels) Gait Pattern/deviations: Step-to pattern;Decreased stride length;Decreased weight shift to right;Antalgic;Knee flexed in stance - right Gait velocity: decreased     General Gait Details: pt agreeable to ambulation with chair follow after minimal encouragement; min gaurd for safety; cues to push walker forward more; 2 seated rest breaks required due to pain and fatigue in RLE   Stairs             Wheelchair Mobility    Modified Rankin (Stroke Patients Only)       Balance Overall balance assessment: Needs assistance Sitting-balance support: No upper extremity supported;Feet supported Sitting balance-Leahy Scale: Good Sitting balance - Comments: able to sit EOB with no physical assistance   Standing balance support: Bilateral upper extremity supported Standing balance-Leahy Scale: Poor Standing balance comment: reliant on BUE support with RW during static standing and ambulation                            Cognition Arousal/Alertness: Awake/alert Behavior During Therapy: WFL for tasks assessed/performed Overall Cognitive Status: Within Functional Limits for tasks assessed  General Comments: pt able to recall how to go up stairs correctly but could not remember correct way to go down        Exercises      General Comments        Pertinent Vitals/Pain Pain Assessment: 0-10 Pain Score: 7  Faces Pain Scale: Hurts even more Pain Location: RLE Pain Descriptors /  Indicators: Discomfort;Operative site guarding;Tingling;Numbness Pain Intervention(s): Monitored during session;Repositioned    Home Living                          Prior Function            PT Goals (current goals can now be found in the care plan section) Acute Rehab PT Goals Patient Stated Goal: to go home PT Goal Formulation: With patient Time For Goal Achievement: 07/29/21 Potential to Achieve Goals: Good Progress towards PT goals: Progressing toward goals    Frequency    Min 3X/week      PT Plan Current plan remains appropriate    Co-evaluation              AM-PAC PT "6 Clicks" Mobility   Outcome Measure  Help needed turning from your back to your side while in a flat bed without using bedrails?: A Little Help needed moving from lying on your back to sitting on the side of a flat bed without using bedrails?: A Little Help needed moving to and from a bed to a chair (including a wheelchair)?: A Little Help needed standing up from a chair using your arms (e.g., wheelchair or bedside chair)?: A Little Help needed to walk in hospital room?: A Little Help needed climbing 3-5 steps with a railing? : A Lot 6 Click Score: 17    End of Session Equipment Utilized During Treatment: Gait belt Activity Tolerance: Patient tolerated treatment well Patient left: in bed;with call bell/phone within reach Nurse Communication: Mobility status (told nurse bed alarm was off) PT Visit Diagnosis: Other abnormalities of gait and mobility (R26.89);Muscle weakness (generalized) (M62.81);Unsteadiness on feet (R26.81);Difficulty in walking, not elsewhere classified (R26.2);Pain Pain - Right/Left: Right Pain - part of body: Leg     Time: 5284-1324 PT Time Calculation (min) (ACUTE ONLY): 34 min  Charges:  $Gait Training: 8-22 mins $Therapeutic Exercise: 8-22 mins                    [name], SPT    [name] 07/19/2021, 12:32 PM

## 2021-07-19 NOTE — Progress Notes (Signed)
Family Medicine Teaching Service Daily Progress Note Intern Pager: [phone]  Patient name: Arthur Mcmahon Medical record number: [id number] Date of birth: June 21, 1966 Age: 55 y.o. Gender: male  Primary Care Provider: Shary Key, DO Consultants: Vascular Surgery Code Status: Full  Pt Overview and Major Events to Date:  11/9     Admitted s/p vascular surgery for right common femoral endarterectomy, lower extremity thrombectomy and fasciotomies  Assessment and Plan:  Mr. Shampine is a 55 year old male admitted postoperatively by vascular surgery after 11/9 operation.  He is currently postop day #5.  PMH includes metastatic bladder cancer with associated right hydroureteronephrosis and hematuria, bilateral edema, constipation, normocytic anemia, chronic hyponatremia, severe protein calorie malnutrition, tobacco use, PAD.   Necrosis of surgical incisions and lymphatic leakage s/p debridement and wound VAC placement  PAD POD # 6, patient reports pain is ... Wound Vac drainage was 250 cc yesterday. PT notes patient poorly tolerating weight bearing do to pain. -R. Groin wound vac and fasciotomy dressing changed yesterday -Continue Tylenol q 6 PRN -Gabapentin 400 TID scheduled -Percocet 5-325 mg q 4 PRN -Vasc surgery following appreciate recs -Therapy teams recommending SNF  Metastatic bladder cancer  right hydroureteronephrosis  Hematuria Patient has appointment with oncology on 07/22/21. Spoke with Dr. Alen Blew and he will look into chart, and reschedule appointment if needed. Patient unlikely to get treatment while recovering from surgery.   -Urology appointment on 08/18/21   Bilateral Edema On exam 1+ pitting edema appreciated in right LE, up to hip. Left leg without edema.  Constipation -Continue Miralax and Senn for bowel regimen  Normocytic anemia Hgb 9.6 today, stable from 8.5 (8.4, 8.0)  Hyponatremia Na stable today at 126 today from 127 yesterday.  FEN/GI: Regular  Diet PPx: Full dose eliquis Dispo:Home with home health  in 2-3 days. Barriers include therapy, dressing changes, vascular surgery involvement.   Subjective:  Patient laying comfortably in bed, reports pain level is 7-8/10, but improved from yesterday.   Objective: Temp:  [98.4 F (36.9 C)-98.6 F (37 C)] 98.4 F (36.9 C) (11/15 0405) Pulse Rate:  [80-88] 80 (11/15 0405) Resp:  [14-18] 14 (11/15 0405) BP: (110-127)/(60-81) 113/72 (11/15 0405) SpO2:  [98 %-100 %] 98 % (11/15 0405) Physical Exam: General: Thin, white male, in NASD, sitting comfortably in bed Cardiovascular: RRR, NRMG Respiratory: CTABL Abdomen: Soft, NTTP, non-distended Extremities: Pitting edema in RLE up to hip, no edema in LLE.   Laboratory: Recent Labs  Lab 07/16/21 0206 07/17/21 0249 07/18/21 0027  WBC 9.2 8.1 8.5  HGB 8.0* 8.4* 8.5*  HCT 24.8* 25.8* 26.6*  PLT 298 343 364   Recent Labs  Lab 07/14/21 0045 07/15/21 0213 07/15/21 0632 07/16/21 0206  NA 127* 128* 128* 127*  K 5.1 4.1 4.1 4.1  CL 94* 96* 95* 94*  CO2 28 26 25 29   BUN 10 9 8 8   CREATININE 0.87 0.85 0.80 0.84  CALCIUM 8.0* 7.8* 7.9* 7.9*  PROT 5.7* 5.3*  --   --   BILITOT 0.2* <0.1*  --   --   ALKPHOS 79 68  --   --   ALT 9 7  --   --   AST 13* 12*  --   --   GLUCOSE 100* 87 78 82      Imaging/Diagnostic Tests:   Holley Bouche, MD 07/19/2021, 6:14 AM PGY-1, [place] Intern pager: [phone], text pages welcome

## 2021-07-19 NOTE — Consult Note (Signed)
WOC Nurse Consult Note: RN, Nego for 4E, and I worked together to change the right groin VAC dressing.  My primary role was coaching Nego through the [facility] dressing change process.  The patient tolerated very well.  One piece of black foam removed from the wound bed, one piece placed into the wound bed. Wound bed was beefy red. Cannister with lymphatic appearing drainage.   Three small VAC dressing kits and one additional cannister in room.  I explained to the patient that the dressing is due to be changed tomorrow and Friday, and that the bedside nurse will perform. [place] nurse will not follow at this time.  Please re-consult the [place] team if needed.  Val Riles, RN, MSN, CWOCN, CNS-BC, pager [phone]

## 2021-07-19 NOTE — Progress Notes (Signed)
RLE fasciotomy sites dressings changed per MD order without difficulty.  Will continue to monitor.

## 2021-07-19 NOTE — Progress Notes (Signed)
Occupational Therapy Treatment Patient Details Name: Arthur Mcmahon MRN: [id number] DOB: 1966-06-09 Today's Date: 07/19/2021   History of present illness Pt is a 55 y.o. male admitted on 07/13/21 for necrosis and lymphatic leakage of RLE.  Debridement of fasciotomy incisions and wound VAC placement of groin incision on 11/09.  PMH includes HTN, PVD, anemia, s/p right thrombectomy and 4 compartment fasciotomies on 06/19/21.   OT comments  Patient seen by skilled OT to address AE training for LB dressing. Patient had complaints of RLE pain and required assistance to get to EOB and transfer to recliner with RW. Patient was instructed on use of sock aide and reacher for LB dressing and demonstrated good understand with return demonstration of use. Acute OT to continue to follow.    Recommendations for follow up therapy are one component of a multi-disciplinary discharge planning process, led by the attending physician.  Recommendations may be updated based on patient status, additional functional criteria and insurance authorization.    Follow Up Recommendations  Skilled nursing-short term rehab (<3 hours/day)    Assistance Recommended at Discharge Frequent or constant Supervision/Assistance  Equipment Recommendations  None recommended by OT    Recommendations for Other Services      Precautions / Restrictions Precautions Precautions: Fall Precaution Comments: wound vac right groin area Restrictions Weight Bearing Restrictions: Yes RLE Weight Bearing: Weight bearing as tolerated       Mobility Bed Mobility Overal bed mobility: Needs Assistance Bed Mobility: Supine to Sit     Supine to sit: HOB elevated;Min assist     General bed mobility comments: required assistance with trunk    Transfers Overall transfer level: Needs assistance Equipment used: Rolling walker (2 wheels) Transfers: Sit to/from Stand;Bed to chair/wheelchair/BSC Sit to Stand: Min assist   Step pivot  transfers: Min assist       General transfer comment: min assist for safety due to patient's complaints of pain     Balance Overall balance assessment: Needs assistance Sitting-balance support: No upper extremity supported Sitting balance-Leahy Scale: Good     Standing balance support: Bilateral upper extremity supported Standing balance-Leahy Scale: Poor Standing balance comment: reliant on fBUE suppor                           ADL either performed or assessed with clinical judgement   ADL Overall ADL's : Needs assistance/impaired                     Lower Body Dressing: Minimal assistance;Moderate assistance Lower Body Dressing Details (indicate cue type and reason): instructed patient on sock aide and reacher use for LB dressing             Functional mobility during ADLs: Minimal assistance;Rolling walker (2 wheels) General ADL Comments: focused on AE training for LB dressing    Extremity/Trunk Assessment Upper Extremity Assessment Upper Extremity Assessment: Defer to OT evaluation            Vision       Perception     Praxis      Cognition Arousal/Alertness: Awake/alert Behavior During Therapy: WFL for tasks assessed/performed Overall Cognitive Status: Within Functional Limits for tasks assessed                                 General Comments: demonstrated good understanding of AE use for LB dressing  Exercises     Shoulder Instructions       General Comments      Pertinent Vitals/ Pain       Pain Assessment: Faces Faces Pain Scale: Hurts even more Pain Location: RLE and groin Pain Descriptors / Indicators: Discomfort;Operative site guarding Pain Intervention(s): Monitored during session;Repositioned  Home Living                                          Prior Functioning/Environment              Frequency  Min 2X/week        Progress Toward Goals  OT  Goals(current goals can now be found in the care plan section)  Progress towards OT goals: Progressing toward goals  Acute Rehab OT Goals Patient Stated Goal: get better to go home OT Goal Formulation: With patient Time For Goal Achievement: 07/29/21 Potential to Achieve Goals: Good ADL Goals Pt Will Perform Grooming: with modified independence;sitting;standing Pt Will Perform Lower Body Bathing: with modified independence;sit to/from stand Pt Will Perform Lower Body Dressing: with modified independence;with adaptive equipment;sit to/from stand Pt Will Transfer to Toilet: ambulating;regular height toilet;with set-up Pt Will Perform Toileting - Clothing Manipulation and hygiene: with modified independence;sit to/from stand Pt Will Perform Tub/Shower Transfer: with modified independence;ambulating;shower seat  Plan Discharge plan remains appropriate    Co-evaluation                 AM-PAC OT "6 Clicks" Daily Activity     Outcome Measure   Help from another person eating meals?: None Help from another person taking care of personal grooming?: None Help from another person toileting, which includes using toliet, bedpan, or urinal?: A Lot Help from another person bathing (including washing, rinsing, drying)?: A Lot Help from another person to put on and taking off regular upper body clothing?: None Help from another person to put on and taking off regular lower body clothing?: A Lot 6 Click Score: 18    End of Session Equipment Utilized During Treatment: Rolling walker (2 wheels)  OT Visit Diagnosis: Unsteadiness on feet (R26.81);Other abnormalities of gait and mobility (R26.89);Pain Pain - Right/Left: Right Pain - part of body: Leg   Activity Tolerance Patient limited by pain   Patient Left in chair;with call bell/phone within reach;with chair alarm set   Nurse Communication Mobility status        Time: 0932-3557 OT Time Calculation (min): 21 min  Charges: OT  General Charges $OT Visit: 1 Visit OT Treatments $Self Care/Home Management : 8-22 mins  Lodema Hong, [place]  Pager [phone] [facility] [place] 07/19/2021, 9:35 AM

## 2021-07-19 NOTE — Progress Notes (Signed)
FPTS Brief Note Reviewed patient's vitals, recent notes.  Vitals:   07/19/21 1435 07/19/21 2003  BP: 125/82 125/80  Pulse: 87 88  Resp: 14 15  Temp: 98.5 F (36.9 C) 99.4 F (37.4 C)  SpO2: 94% 98%   At this time, no change in plan from day progress note.  [name], MD Page [phone] with questions about this patient.

## 2021-07-19 NOTE — Progress Notes (Signed)
Progress Note - Phone Call with Oncology  Called and spoke with Dr. Alen Blew about patient and upcoming oncology appointment. Dr. Alen Blew said he would review the chart and determine if there was anything that needed to be done while patient is inpatient. Otherwise they will re-schedule his appointment. Dr. Alen Blew noted that patient likely will not get treatment while he's recovering from his surgery.   [place] PGY-1

## 2021-07-19 NOTE — Consult Note (Signed)
WOC Nurse Consult Note: Patient receiving care in [place]. Reason for Consult: VAC dressing didn't get changed yesterday. Change today and get back on a MWF schedule until discharged. I learned from reviewing the EMR that on 11/11 the [facility] dressing was turned over to the primary nurse due to the simplicity of the process for this patient. I spoke with PA Gerri Lins via telephone. Then I spoke with the primary nurse for today via telephone, Jenn.  She explained she is comfortable changing the dressing and supplies are in the room.  I sent her a Secure Chat with my pager number in case she needs any type of assistance with the dressing change. Val Riles, RN, MSN, CWOCN, CNS-BC, pager [phone]

## 2021-07-19 NOTE — Plan of Care (Signed)
  Problem: Clinical Measurements: Goal: Respiratory complications will improve Outcome: Progressing Goal: Cardiovascular complication will be avoided Outcome: Progressing   

## 2021-07-20 LAB — CBC WITH DIFFERENTIAL/PLATELET
Abs Immature Granulocytes: 0.15 10*3/uL — ABNORMAL HIGH (ref 0.00–0.07)
Basophils Absolute: 0 10*3/uL (ref 0.0–0.1)
Basophils Relative: 0 %
Eosinophils Absolute: 0.2 10*3/uL (ref 0.0–0.5)
Eosinophils Relative: 2 %
HCT: 26.5 % — ABNORMAL LOW (ref 39.0–52.0)
Hemoglobin: 8.4 g/dL — ABNORMAL LOW (ref 13.0–17.0)
Immature Granulocytes: 2 %
Lymphocytes Relative: 9 %
Lymphs Abs: 0.8 10*3/uL (ref 0.7–4.0)
MCH: 29.5 pg (ref 26.0–34.0)
MCHC: 31.7 g/dL (ref 30.0–36.0)
MCV: 93 fL (ref 80.0–100.0)
Monocytes Absolute: 0.8 10*3/uL (ref 0.1–1.0)
Monocytes Relative: 9 %
Neutro Abs: 7.1 10*3/uL (ref 1.7–7.7)
Neutrophils Relative %: 78 %
Platelets: 429 10*3/uL — ABNORMAL HIGH (ref 150–400)
RBC: 2.85 MIL/uL — ABNORMAL LOW (ref 4.22–5.81)
RDW: 14.6 % (ref 11.5–15.5)
WBC: 9.1 10*3/uL (ref 4.0–10.5)
nRBC: 0 % (ref 0.0–0.2)

## 2021-07-20 LAB — BASIC METABOLIC PANEL
Anion gap: 6 (ref 5–15)
BUN: 16 mg/dL (ref 6–20)
CO2: 28 mmol/L (ref 22–32)
Calcium: 8.1 mg/dL — ABNORMAL LOW (ref 8.9–10.3)
Chloride: 93 mmol/L — ABNORMAL LOW (ref 98–111)
Creatinine, Ser: 0.85 mg/dL (ref 0.61–1.24)
GFR, Estimated: >60 mL/min (ref >60)
Glucose, Bld: 97 mg/dL (ref 70–99)
Potassium: 4.5 mmol/L (ref 3.5–5.1)
Sodium: 127 mmol/L — ABNORMAL LOW (ref 135–145)

## 2021-07-20 NOTE — Progress Notes (Signed)
Nutrition Follow-up  DOCUMENTATION CODES:   Severe malnutrition in context of chronic illness  INTERVENTION:   Continue Ensure Enlive po BID, each supplement provides 350 kcal and 20 grams of protein Continue Juven BID, each packet provides 80 calories, 8 grams of carbohydrate, 2.5  grams of protein (collagen), 7 grams of L-arginine and 7 grams of L-glutamine; supplement contains CaHMB, Vitamins C, E, B12 and Zinc to promote wound healing Continue MVI with minerals daily  NUTRITION DIAGNOSIS:   Severe Malnutrition related to chronic illness (PVD) as evidenced by percent weight loss, severe muscle depletion.  Ongoing  GOAL:   Patient will meet greater than or equal to 90% of their needs  Progressing  MONITOR:   PO intake, Supplement acceptance, Labs, Weight trends, Skin, I & O's  REASON FOR ASSESSMENT:   Consult Assessment of nutrition requirement/status, Poor PO, Wound healing  ASSESSMENT:   55 yo male with a PMH of metastatic bladder cancer with lymphadenopathy in the groin, HTN, EtOH abuse, PVD, and anemia who presents with wound VAC placement of groin incision today for ongoing lymphatic leak and debridement of necrotic lower extremity fasciotomy incisions.  Right groin VAC dressing to be changed Friday. Patient not discharging home today.  Patient reports that he has been eating pretty well.  He likes the Juven drink and has been drinking it BID and Ensure once per day. Encouraged patient to drink Juven and Ensure Enlive both BID, he agreed. He has not been drinking the Ensure Max, will discontinue.  Remains on a regular diet; meal intakes 75-100% Supplements: Juven BID, Ensure Enlive/Plus BID, Ensure Max daily  Labs reviewed. Na 127  Medications reviewed and include MVI with minerals, Juven.  No new weight available since 11/9.  Diet Order:   Diet Order             Diet regular Room service appropriate? Yes with Assist; Fluid consistency: Thin  Diet  effective now                   EDUCATION NEEDS:   Education needs have been addressed  Skin:  Skin Assessment: Skin Integrity Issues: Skin Integrity Issues:: Wound VAC Wound Vac: R groin  Last BM:  11/15 type 6  Height:   Ht Readings from Last 1 Encounters:  07/13/21 6\' 2"  (1.88 m)    Weight:   Wt Readings from Last 1 Encounters:  07/13/21 84.8 kg   BMI:  Body mass index is 24.01 kg/m.  Estimated Nutritional Needs:   Kcal:  2300-2500  Protein:  110-130 grams  Fluid:  >2.3 L   Lucas Mallow, RD, LDN, CNSC Please refer to Amion for contact information.

## 2021-07-20 NOTE — TOC Progression Note (Signed)
Transition of Care [facility]) - Progression Note    Patient Details  Name: Arthur Mcmahon MRN: [id number] Date of Birth: 09/28/1965  Transition of Care [facility] CM/SW Contact  Joanne Chars, LCSW Phone Number: 07/20/2021, 4:13 PM  Clinical Narrative:  Email form Saprese Jones/ [employer].  [employer] submitted medicaid application for this pt.       Expected Discharge Plan: [place] Barriers to Discharge: Continued Medical Work up  Expected Discharge Plan and Services Expected Discharge Plan: [place] In-house Referral: Clinical Social Work Discharge Planning Services: CM Consult Post Acute Care Choice: Home Health, Resumption of Svcs/PTA Provider Living arrangements for the past 2 months: Single Family Home                 DME Arranged: Vac, Bedside commode DME Agency: [employer], Soyla Murphy Date DME Agency Contacted: 07/18/21 Time DME Agency Contacted: 1100 Representative spoke with at DME Agency: Olivia Mackie w/ [facility] HH Arranged: RN, PT [facility] Agency: [place] ([place]) Date [place]: 07/18/21 Time [place]: 1000 Representative spoke with at [place]: [place] ([place]) Interventions    Readmission Risk Interventions Readmission Risk Prevention Plan 07/04/2021 06/22/2021  Post Dischage Appt - Complete  Medication Screening - Complete  Transportation Screening Complete Complete  PCP or Specialist Appt within 5-7 Days Complete -  Home Care Screening Complete -  Medication Review (RN CM) Complete -  Some recent data might be hidden

## 2021-07-20 NOTE — Progress Notes (Signed)
R lower leg dressing changed this pm per orders. Will pass along to day shift RN to defer am dressing change to vascular team.   Jaymes Graff, RN

## 2021-07-20 NOTE — Progress Notes (Signed)
IV team consulted due to no access. Pt. Is receiving no IV meds. Stated that best practice is to not start a new IV unless it is needed. Advised RN to call if pt. Condition changes or we are needed.

## 2021-07-20 NOTE — Progress Notes (Signed)
Pt not to be discharged today.Will defer right groin VAC dressing change until Friday per order in place.   Raelyn Number, RN

## 2021-07-20 NOTE — Progress Notes (Signed)
Family Medicine Teaching Service Daily Progress Note Intern Pager: [phone]  Patient name: Arthur Mcmahon Medical record number: [id number] Date of birth: [birth year] Age: 55 y.o. Gender: male  Primary Care Provider: Shary Key, DO Consultants: Vascular Surgery Code Status: Full  Pt Overview and Major Events to Date:  11/9     Admitted s/p vascular surgery for right common femoral endarterectomy, lower extremity thrombectomy and fasciotomies  Assessment and Plan:  Mr. Arthur Mcmahon is a 55 year old male admitted postoperatively by vascular surgery after 11/9 operation.  He is currently postop day #5.  PMH includes metastatic bladder cancer with associated right hydroureteronephrosis and hematuria, bilateral edema, constipation, normocytic anemia, chronic hyponatremia, severe protein calorie malnutrition, tobacco use, PAD.   Necrosis of surgical incisions and lymphatic leakage s/p debridement and wound VAC placement  PAD POD #7,  Patient pain is well controlled and rated a 7-8/10. Wound vac drainage 500 cc yesterday. Friday  for next wound vac dressing changes, with wound vac changes to happen for MWF. Fasciotemy dressing changed before shift change, and vascular surgery changed dressing today. Vascular saying the wound cleaned up nicely. Vascular surgery say's he's ok to d/c with home health.  -Continue Tylenol q 6 prn -Gabapentin 400 mg TID  -Perfocet 5-325 mg q 4 hr PRN -Vascular surgery following, appreciate recs   Metastatic bladder cancer  right hydroureteronephrosis  Hematuria Patient is stable, but likely will not receive treatment for his cancer, while healing. Oncology appointment 07/22/21 Urology appointment 08/18/21  Bilateral Edema Leg edema is 3+ pitting on right side, and not present on the right. Stable from previous day.   Constipation -Continue Miralax and Senna  Normocytic anemia Hgb stable at 8.4, from (9.6, 8.5, 8.4)  Hyponatremia Na stable at 127 from  126 yesterday  FEN/GI: Regular Diet PPx: Full Dose Eliquis Dispo:Home with home health  in 3 or more days.    Subjective:  Patient laying comfortably in bed, reports pain is notable now, as he just had a dressing change for his fasciotomy.  Objective: Temp:  [97.9 F (36.6 C)-99.4 F (37.4 C)] 97.9 F (36.6 C) (11/16 0344) Pulse Rate:  [87-94] 89 (11/16 0344) Resp:  [14-20] 19 (11/16 0344) BP: (105-141)/(71-88) 123/71 (11/16 0344) SpO2:  [91 %-99 %] 99 % (11/16 0344) Physical Exam: General: Thin, frail, white male, NASD Cardiovascular: RRR, NRMG Respiratory: CTABL Abdomen: Soft, NTTP, non-distended Extremities: Can wiggle toes in right leg, moving all other extremities independently.   Laboratory: Recent Labs  Lab 07/17/21 0249 07/18/21 0027 07/19/21 0800  WBC 8.1 8.5 9.0  HGB 8.4* 8.5* 9.6*  HCT 25.8* 26.6* 30.0*  PLT 343 364 474*   Recent Labs  Lab 07/14/21 0045 07/15/21 0213 07/15/21 0632 07/16/21 0206 07/19/21 0800  NA 127* 128* 128* 127* 126*  K 5.1 4.1 4.1 4.1 4.1  CL 94* 96* 95* 94* 88*  CO2 28 26 25 29 25   BUN 10 9 8 8 14   CREATININE 0.87 0.85 0.80 0.84 1.01  CALCIUM 8.0* 7.8* 7.9* 7.9* 8.1*  PROT 5.7* 5.3*  --   --   --   BILITOT 0.2* <0.1*  --   --   --   ALKPHOS 79 68  --   --   --   ALT 9 7  --   --   --   AST 13* 12*  --   --   --   GLUCOSE 100* 87 78 82 135*      Imaging/Diagnostic Tests:  Holley Bouche, MD 07/20/2021, 6:05 AM PGY-1, [place] Intern pager: [phone], text pages welcome

## 2021-07-20 NOTE — Progress Notes (Addendum)
VASCULAR SURGERY ASSESSMENT & PLAN:   PAD: History of acute on chronic limb ischemia of the right lower extremity requiring femoral endarterectomy as well as fasciotomies during previous admission.  Lymphatic leak in the right groin and breakdown and necrosis of his fasciotomy incisions. Apixaban and statin continue. POD 7 sharp excisional debridement of right groin and bilateral right lower leg fasciotomy incisions. Wound VAC placed to groin yesterday>>plan to change again on Friday. Right lower leg dressings were just changed prior to shift change and I did not remove. Will pass on to nursing staff that vascular team will change his lower leg dressings tomorrow and inspect his wounds.  Goal is to eventually place VAC dressings to lower leg.  RLE edema: try to keep RLE elevated about heart level as much as possible  Previous left CFA endarterectomy with bovine pericaridal patch and popliteal stent placement August 2022   Metastatic bladder cancer SUBJECTIVE:   No complaints.  PHYSICAL EXAM:   Vitals:   07/19/21 1435 07/19/21 2003 07/19/21 2332 07/20/21 0344  BP: 125/82 125/80 105/88 123/71  Pulse: 87 88 90 89  Resp: 14 15 20 19   Temp: 98.5 F (36.9 C) 99.4 F (37.4 C) 98.7 F (37.1 C) 97.9 F (36.6 C)  TempSrc: Oral Oral Oral Oral  SpO2: 94% 98% 98% 99%  Weight:      Height:       General appearance: Awake, alert in no apparent distress Cardiac: Heart rate and rhythm are regular Respirations: Nonlabored Incisions: Right groin with VAC dressing place with good seal. His canister level is at 350 cc this morning>>serous output. Extremities: RLE edema>>most likely lymphedema, especially of right thigh. Pulse/Doppler exam: Brisk right posterior tibial artery Doppler signal   LABS:   Lab Results  Component Value Date   WBC 9.1 07/20/2021   HGB 8.4 (L) 07/20/2021   HCT 26.5 (L) 07/20/2021   MCV 93.0 07/20/2021   PLT 429 (H) 07/20/2021   Lab Results  Component Value Date    CREATININE 1.01 07/19/2021   Lab Results  Component Value Date   INR 1.0 06/18/2021   CBG (last 3)  No results for input(s): GLUCAP in the last 72 hours.  PROBLEM LIST:    Active Problems:   Tobacco use   Malignant neoplasm of urinary bladder (HCC)   Constipation   Encounter for postoperative wound care   Protein-calorie malnutrition, severe   CURRENT MEDS:    apixaban  5 mg Oral BID   atorvastatin  40 mg Oral Daily   feeding supplement  237 mL Oral BID BM   gabapentin  400 mg Oral TID   multivitamin with minerals  1 tablet Oral Daily   nutrition supplement (JUVEN)  1 packet Oral BID BM   polyethylene glycol  34 g Oral BID   Ensure Max Protein  11 oz Oral Daily   QUEtiapine  25 mg Oral QHS   senna  1 tablet Oral BID   sodium hypochlorite  1 application Irrigation Daily   Barbie Banner, PA-C  Office: [phone] 07/20/2021   I have seen and evaluated the patient. I agree with the PA note as documented above.  Right groin VAC with good seal and output 500 mL in last 24 hours.  Plan discharge with VAC to right groin with Monday Wednesday Friday VAC changes.  My concern is that home VAC he had at the time of discharge from [facility] is a very small canister and this filled up  immediately at home and he came back to the hospital.  I have asked nursing to reach out to social work to see if there is any options for a larger VAC canister that can accommodate more volume for discharge.  I changed his dressings this morning and the fasciotomies have cleaned up nicely with the Dakin's.  I favor him going home with wet-to-dry dressing with Dakin's.  When his home health can be arranged I am okay with discharge.  He has a good PT signal in the right foot.  [name], MD [facility] [place] Office: [phone]

## 2021-07-21 LAB — CBC WITH DIFFERENTIAL/PLATELET
Abs Immature Granulocytes: 0.2 10*3/uL — ABNORMAL HIGH (ref 0.00–0.07)
Basophils Absolute: 0 10*3/uL (ref 0.0–0.1)
Basophils Relative: 0 %
Eosinophils Absolute: 0.2 10*3/uL (ref 0.0–0.5)
Eosinophils Relative: 3 %
HCT: 26.7 % — ABNORMAL LOW (ref 39.0–52.0)
Hemoglobin: 8.5 g/dL — ABNORMAL LOW (ref 13.0–17.0)
Immature Granulocytes: 2 %
Lymphocytes Relative: 8 %
Lymphs Abs: 0.7 10*3/uL (ref 0.7–4.0)
MCH: 29.5 pg (ref 26.0–34.0)
MCHC: 31.8 g/dL (ref 30.0–36.0)
MCV: 92.7 fL (ref 80.0–100.0)
Monocytes Absolute: 0.8 10*3/uL (ref 0.1–1.0)
Monocytes Relative: 9 %
Neutro Abs: 6.7 10*3/uL (ref 1.7–7.7)
Neutrophils Relative %: 78 %
Platelets: 441 10*3/uL — ABNORMAL HIGH (ref 150–400)
RBC: 2.88 MIL/uL — ABNORMAL LOW (ref 4.22–5.81)
RDW: 14.6 % (ref 11.5–15.5)
WBC: 8.6 10*3/uL (ref 4.0–10.5)
nRBC: 0 % (ref 0.0–0.2)

## 2021-07-21 LAB — BASIC METABOLIC PANEL
Anion gap: 5 (ref 5–15)
BUN: 14 mg/dL (ref 6–20)
CO2: 29 mmol/L (ref 22–32)
Calcium: 8.1 mg/dL — ABNORMAL LOW (ref 8.9–10.3)
Chloride: 93 mmol/L — ABNORMAL LOW (ref 98–111)
Creatinine, Ser: 0.92 mg/dL (ref 0.61–1.24)
GFR, Estimated: >60 mL/min (ref >60)
Glucose, Bld: 87 mg/dL (ref 70–99)
Potassium: 4.8 mmol/L (ref 3.5–5.1)
Sodium: 127 mmol/L — ABNORMAL LOW (ref 135–145)

## 2021-07-21 NOTE — Progress Notes (Signed)
Family Medicine Teaching Service Daily Progress Note Intern Pager: [phone]  Patient name: Arthur Mcmahon Medical record number: [id number] Date of birth: 1965/12/17 Age: 55 y.o. Gender: male  Primary Care Provider: Shary Key, DO Consultants: Vascular Surgery Code Status: Full  Pt Overview and Major Events to Date:  11/9     Admitted s/p vascular surgery for right common femoral endarterectomy, lower extremity thrombectomy and fasciotomies  Assessment and Plan:  [name] is a 55 year old male admitted postoperatively by vascular surgery after 11/9 operation.  He is currently postop day #5.  PMH includes metastatic bladder cancer with associated right hydroureteronephrosis and hematuria, bilateral edema, constipation, normocytic anemia, chronic hyponatremia, severe protein calorie malnutrition, tobacco use, PAD.   Necrosis of surgical incisions and lymphatic leakage s/p debridement and wound VAC placement  PAD POD #8 -Continue Tylenol q 6 prn -Gabapentin 400 mg TID -Percocet 5-325 mg q 4 PRN -Vascular Surgery following, appreciate recs   Metastatic bladder cancer  right hydroureteronephrosis  Hematuria Patient awaiting d/c to make Oncology appoint on Friday 07/22/21. Patient has Urology appointment 08/18/21   Bilateral Leg Edema Leg edema appreciated to be 3+ pitting on right, to level of hip. No edema appreciated on the left.  Constipation -Continue Miralax and senna. Patient having regular bowel movements.   Normocytic anemia Hgb 8.5 today, stable from previous  Hyponatremia Na 127 today, stable from previous  FEN/GI: Regular diet PPx: [place] dose eliquis Dispo:Home with home health  tomorrow. Barriers include wound care.   Subjective:  Patient laying comfortably in bed, complains that his pain is 7-8/10.   Objective: Temp:  [97.8 F (36.6 C)-98.7 F (37.1 C)] 98.4 F (36.9 C) (11/17 0311) Pulse Rate:  [70-89] 70 (11/17 0311) Resp:  [16-20] 18 (11/17  0311) BP: (112-135)/(70-84) 112/76 (11/17 0311) SpO2:  [98 %-100 %] 98 % (11/17 0311) Physical Exam: General: Thin, white male, in NASD, laying comfortably in bed, good affect Cardiovascular: RRR, NRMG Respiratory: CTABL Abdomen: Soft, NTTP, non-distsended Extremities: Moving both upper extremities independently. Right LE bandaged, able to wiggle toes.   Laboratory: Recent Labs  Lab 07/18/21 0027 07/19/21 0800 07/20/21 0618  WBC 8.5 9.0 9.1  HGB 8.5* 9.6* 8.4*  HCT 26.6* 30.0* 26.5*  PLT 364 474* 429*   Recent Labs  Lab 07/15/21 0213 07/15/21 0632 07/16/21 0206 07/19/21 0800 07/20/21 0618  NA 128*   < > 127* 126* 127*  K 4.1   < > 4.1 4.1 4.5  CL 96*   < > 94* 88* 93*  CO2 26   < > 29 25 28   BUN 9   < > 8 14 16   CREATININE 0.85   < > 0.84 1.01 0.85  CALCIUM 7.8*   < > 7.9* 8.1* 8.1*  PROT 5.3*  --   --   --   --   BILITOT <0.1*  --   --   --   --   ALKPHOS 68  --   --   --   --   ALT 7  --   --   --   --   AST 12*  --   --   --   --   GLUCOSE 87   < > 82 135* 97   < > = values in this interval not displayed.      Imaging/Diagnostic Tests:   Holley Bouche, MD 07/21/2021, 5:52 AM PGY-1, [place] Intern pager: [phone], text pages welcome

## 2021-07-21 NOTE — Progress Notes (Addendum)
VASCULAR SURGERY ASSESSMENT & PLAN:   PAD: History of acute on chronic limb ischemia of the right lower extremity requiring femoral endarterectomy as well as fasciotomies during previous admission.  Lymphatic leak in the right groin and breakdown and necrosis of his fasciotomy incisions. Apixaban and statin continue.  POD 8 sharp excisional debridement of right groin and bilateral right lower leg fasciotomy incisions. Brisk multiphasic right PT Doppler signal. Wound VAC placed to groin yesterday>>plan to change again on Friday. Right lower leg dressings were changed yesterday by Dr. Carlis Abbott. Continue Dakins damp-to-dry. He has had 100 cc of serous output from his right groin in the last 12 hours. He says he capable of changing the [facility] cannister at home, if necessary, in between [facility] visits. He would need several home VAC pump canisters at discharge.  RLE edema: try to keep RLE elevated about heart level as much as possible   Previous left CFA endarterectomy with bovine pericaridal patch and popliteal stent placement August 2022   Metastatic bladder cancer   SUBJECTIVE:   No complaints  PHYSICAL EXAM:   Vitals:   07/20/21 2007 07/20/21 2315 07/21/21 0311 07/21/21 0725  BP: 124/70 120/74 112/76 122/76  Pulse: 84 81 70 96  Resp: 17 16 18 17   Temp: 98.4 F (36.9 C) 98.1 F (36.7 C) 98.4 F (36.9 C) 98.9 F (37.2 C)  TempSrc: Oral Oral Oral Oral  SpO2: 100% 100% 98% 97%  Weight:      Height:       General appearance: Awake, alert in no apparent distress Cardiac: Heart rate and rhythm are regular Respirations: Nonlabored Incisions: Right groin with VAC dressing place with good seal. His canister level is at 100 cc this morning>>serous output. He says the new canister was placed around 7pm yesterday. Extremities: RLE edema>>most likely lymphedema, especially of right thigh.  Pulse/Doppler exam: Brisk right posterior tibial artery Doppler signal   LABS:   Lab Results  Component  Value Date   WBC 8.6 07/21/2021   HGB 8.5 (L) 07/21/2021   HCT 26.7 (L) 07/21/2021   MCV 92.7 07/21/2021   PLT 441 (H) 07/21/2021   Lab Results  Component Value Date   CREATININE 0.85 07/20/2021   Lab Results  Component Value Date   INR 1.0 06/18/2021   CBG (last 3)  No results for input(s): GLUCAP in the last 72 hours.  PROBLEM LIST:    Active Problems:   Tobacco use   Malignant neoplasm of urinary bladder (HCC)   Constipation   Encounter for postoperative wound care   Protein-calorie malnutrition, severe   CURRENT MEDS:    apixaban  5 mg Oral BID   atorvastatin  40 mg Oral Daily   feeding supplement  237 mL Oral BID BM   gabapentin  400 mg Oral TID   multivitamin with minerals  1 tablet Oral Daily   nutrition supplement (JUVEN)  1 packet Oral BID BM   polyethylene glycol  34 g Oral BID   QUEtiapine  25 mg Oral QHS   senna  1 tablet Oral BID   sodium hypochlorite  1 application Irrigation Daily   Barbie Banner, PA-C  Office: [phone] 07/21/2021   I have seen and evaluated the patient. I agree with the PA note as documented above.  Discussed with nurse about changing right groin vac tomorrow to get him back on a Monday Wednesday Friday schedule.  We will plan discharge with VAC to right groin with VAC change 3  times a week at home.  Output remains high at 475 mL over the last 24 hours.  Apparently [facility] does not have a larger VAC and his previous home VAC has a very small canister and the output previously overwhelmed home canister with output.  Hopefully this will continue to decrease as the groin granulates and scars in place.  He does have metastatic bladder cancer with significant adenopathy in the right groin that complicates the situation making the output much worse.  Plan Dakin's wet-to-dry to the fasciotomies with home health and these look much better.  Good PT signal in the right foot.  No plans for further operative intervention at this  time.  [name], MD [facility] [place] Office: [phone]

## 2021-07-21 NOTE — Progress Notes (Signed)
Mobility Specialist: Progress Note   07/21/21 1225  Mobility  Activity Ambulated in room  Level of Assistance Standby assist, set-up cues, supervision of patient - no hands on  Assistive Device Front wheel walker  Distance Ambulated (ft) 20 ft  Mobility Ambulated with assistance in room  Mobility Response Tolerated well  Mobility performed by Mobility specialist  $Mobility charge 1 Mobility   Pre-Mobility: 97 HR During Mobility: 127 HR Post-Mobility: 114 HR  Pt c/o 7-8/10 pain during ambulation in his RLE, otherwise no c/o. Pt back to bed after walk with call bell and phone at his side.   [facility] Anterio Scheel Mobility Specialist Mobility Specialist Phone: [phone]

## 2021-07-21 NOTE — TOC Progression Note (Signed)
Transition of Care (TOC) - Progression Note  Marvetta Gibbons RN, BSN Transitions of Care Unit 4E- RN Case Manager See Treatment Team for direct phone #    Patient Details  Name: Arthur Mcmahon MRN: [id number] Date of Birth: [birth year]  Transition of Care [facility]) CM/SW Contact  Dahlia Client, Romeo Rabon, RN Phone Number: 07/21/2021, 11:26 AM  Clinical Narrative:    Referral received regarding home vac options for discharge with pt having drainage from groin of 500cc per 24 hrs. Spoke with [facility] hospital liaison to discuss home vac options. Per Olivia Mackie with [employer] there are no options for larger home VAC or larger canisters for home. And HH would not be able to come daily to assist pt and monitor drainage at home. Extra canisters for that amount of drainage would also be a challenge for home. Per [facility] they feel like the best avenue to take would be to keep pt in house for close monitoring of drainage and until the drainage amount decreases to amount that can be managed at home with [facility] follow up (hopeful that with increased granulation of wound the drainage will decrease).  Pt has home [facility] vac at the bedside.   Expected Discharge Plan: [place] Barriers to Discharge: Continued Medical Work up  Expected Discharge Plan and Services Expected Discharge Plan: [place] In-house Referral: Clinical Social Work Discharge Planning Services: CM Consult Post Acute Care Choice: Home Health, Resumption of Svcs/PTA Provider Living arrangements for the past 2 months: Single Family Home                 DME Arranged: Vac, Bedside commode DME Agency: [employer], Soyla Murphy Date DME Agency Contacted: 07/18/21 Time DME Agency Contacted: 1100 Representative spoke with at DME Agency: Olivia Mackie w/ [facility] HH Arranged: RN, PT [facility] Agency: [place] ([place]) Date [place]: 07/18/21 Time [place]: 1000 Representative spoke with at [place]: [place] ([place]) Interventions    Readmission Risk Interventions Readmission Risk Prevention Plan 07/04/2021 06/22/2021  Post Dischage Appt - Complete  Medication Screening - Complete  Transportation Screening Complete Complete  PCP or Specialist Appt within 5-7 Days Complete -  Home Care Screening Complete -  Medication Review (RN CM) Complete -  Some recent data might be hidden

## 2021-07-21 NOTE — Progress Notes (Signed)
Progress Note, FPTS, Transportation  Spoke with Case Manager, Marvetta Gibbons, about getting patient transportation to Oncology appointment with Dr. Alen Blew tomorrow at 11 am 07/22/21. Case Manager agreed to setup transportation for Arthur Mcmahon tomorrow morning around 10:30. Family Medicine team will coordinate discharge with vascular surgery in the morning.  [place] - PGY-1

## 2021-07-21 NOTE — Progress Notes (Signed)
Received notification from [employer] ([place]) regarding approval for [employer]. Patient assistance approved from 07/19/21 to 07/18/22.  Medication will ship to pt's home in the next few weeks.   Phone: [phone]

## 2021-07-21 NOTE — Progress Notes (Signed)
Physical Therapy Treatment Patient Details Name: Arthur Mcmahon MRN: [id number] DOB: 11-Mar-1966 Today's Date: 07/21/2021   History of Present Illness Pt is a 55 y.o. male admitted on 07/13/21 for necrosis and lymphatic leakage of RLE.  Debridement of fasciotomy incisions and wound VAC placement of groin incision on 11/09.  PMH includes HTN, PVD, anemia, s/p right thrombectomy and 4 compartment fasciotomies on 06/19/21.    PT Comments    Pt is progressing with mobility. Today's session focused on stair training to prepare pt to return home. Pt was limited by pain and fatigue today but agreeable to therapy with encouragement. Able to navigate 1 step in the room today with cues and min guard. Talked with pt about his need for assistance when returning home and he stated family would be available to help him. Continue to recommend [facility] PT after discharge to further progress strength and functional mobility to maximize independence. Will continue to follow acutely to address short-term PT goals.     Recommendations for follow up therapy are one component of a multi-disciplinary discharge planning process, led by the attending physician.  Recommendations may be updated based on patient status, additional functional criteria and insurance authorization.  Follow Up Recommendations  Home health PT     Assistance Recommended at Discharge [facility] bed    Recommendations for Other Services       Precautions / Restrictions Precautions Precautions: Fall Precaution Comments: wound vac right groin area Restrictions Weight Bearing Restrictions: Yes RLE Weight Bearing: Weight bearing as tolerated     Mobility  Bed Mobility Overal bed mobility: Modified Independent Bed Mobility: Supine to Sit     Supine to sit: Modified independent (Device/Increase time);HOB elevated Sit to supine: Modified independent (Device/Increase time)    General bed mobility comments: able to go from supine <> sit EOB with use of bedrails and increased time    Transfers Overall transfer level: Needs assistance Equipment used: Rolling walker (2 wheels) Transfers: Sit to/from Stand Sit to Stand: Min guard           General transfer comment: able to stand from EOB with increased time, good use of UE with RW, min gaurd for safety    Ambulation/Gait Ambulation/Gait assistance: Min guard Gait Distance (Feet): 14 Feet Assistive device: Rolling walker (2 wheels) Gait Pattern/deviations: Step-to pattern;Decreased stride length;Decreased weight shift to right;Antalgic Gait velocity: decreased     General Gait Details: pt ambulated in room with min gaurd for safety; declined further ambulation stating he would walk with nursing later   Stairs Stairs: Yes Stairs assistance: Min guard Stair Management: No rails;With walker Number of Stairs: 1 General stair comments: Pt attempted going forwards up stair but was unable to shift weight onto his right leg to go up. Had pt turn to go up backwards to allow for more reliance on RW and then he was able to navigate up and down; cues needing for sequencing, min gaurd for safety. Pt states family will be available to help him with the stair once he returns home.   Wheelchair Mobility    Modified Rankin (Stroke Patients Only)       Balance Overall balance assessment: Needs assistance Sitting-balance support: No upper extremity supported;Feet supported Sitting balance-Leahy Scale: Good Sitting balance - Comments: able to sit EOB with no physical assistance   Standing balance support: Bilateral upper extremity supported Standing balance-Leahy Scale: Poor Standing balance comment: reliant on BUE support with RW during static standing,  ambulation, and stairs                            Cognition Arousal/Alertness: Awake/alert Behavior During Therapy: WFL for tasks  assessed/performed Overall Cognitive Status: Within Functional Limits for tasks assessed                                 General Comments: pt needed cues to remember stair sequencing        Exercises      General Comments General comments (skin integrity, edema, etc.): HR up to 149 with stair activity; pt needed encouragement to participate in PT today due to pain      Pertinent Vitals/Pain Pain Assessment: 0-10 Pain Score: 9  Pain Location: RLE and groin Pain Descriptors / Indicators: Discomfort;Operative site guarding;Throbbing Pain Intervention(s): Limited activity within patient's tolerance;Monitored during session    Home Living                          Prior Function            PT Goals (current goals can now be found in the care plan section) Acute Rehab PT Goals Patient Stated Goal: to go home PT Goal Formulation: With patient Time For Goal Achievement: 07/29/21 Potential to Achieve Goals: Good Progress towards PT goals: Progressing toward goals    Frequency    Min 3X/week      PT Plan Current plan remains appropriate    Co-evaluation              AM-PAC PT "6 Clicks" Mobility   Outcome Measure  Help needed turning from your back to your side while in a flat bed without using bedrails?: A Little Help needed moving from lying on your back to sitting on the side of a flat bed without using bedrails?: A Little Help needed moving to and from a bed to a chair (including a wheelchair)?: A Little Help needed standing up from a chair using your arms (e.g., wheelchair or bedside chair)?: A Little Help needed to walk in hospital room?: A Little Help needed climbing 3-5 steps with a railing? : A Lot 6 Click Score: 17    End of Session Equipment Utilized During Treatment: Gait belt Activity Tolerance: Patient limited by pain Patient left: in bed;with call bell/phone within reach;with nursing/sitter in room;with bed alarm  set Nurse Communication: Mobility status PT Visit Diagnosis: Other abnormalities of gait and mobility (R26.89);Muscle weakness (generalized) (M62.81);Unsteadiness on feet (R26.81);Difficulty in walking, not elsewhere classified (R26.2);Pain Pain - Right/Left: Right Pain - part of body: Leg     Time: 0820-0843 PT Time Calculation (min) (ACUTE ONLY): 23 min  Charges:  $Gait Training: 8-22 mins                     [name], SPT    [name] 07/21/2021, 10:57 AM

## 2021-07-21 NOTE — Progress Notes (Signed)
FPTS Brief Note Reviewed patient's vitals, recent notes.  Vitals:   07/20/21 2007 07/20/21 2315  BP: 124/70 120/74  Pulse: 84 81  Resp: 17 16  Temp: 98.4 F (36.9 C) 98.1 F (36.7 C)  SpO2: 100% 100%   At this time, no change in plan from day progress note.  Gerrit Heck, MD Page [phone] with questions about this patient.

## 2021-07-22 ENCOUNTER — Inpatient Hospital Stay: Payer: MEDICAID | Admitting: Oncology

## 2021-07-22 NOTE — Progress Notes (Signed)
FPTS Brief Note Reviewed patient's vitals, recent notes.  Vitals:   07/22/21 1810 07/22/21 1946  BP: 118/71 130/78  Pulse: 85 88  Resp: 18 18  Temp: 98.9 F (37.2 C) 98.3 F (36.8 C)  SpO2: 99% 91%   At this time, no change in plan from day progress note.  [name], DO Page [phone] with questions about this patient.

## 2021-07-22 NOTE — Progress Notes (Signed)
OT Cancellation Note  Patient Details Name: Arthur Mcmahon MRN: [id number] DOB: 1966-02-25   Cancelled Treatment:    Reason Eval/Treat Not Completed: Patient declined, no reason specified (patient states he has recently returned to bed and asked OT to return after lunch.) Lodema Hong, [place]  Pager [phone] Office [place] 07/22/2021, 12:03 PM

## 2021-07-22 NOTE — Progress Notes (Signed)
Mobility Specialist: Progress Note   07/22/21 1147  Mobility  Activity Transferred:  Chair to bed  Level of Assistance Standby assist, set-up cues, supervision of patient - no hands on  Assistive Device Front wheel walker  Distance Ambulated (ft) 2 ft  Mobility Out of bed to chair with meals  Mobility Response Tolerated well  Mobility performed by Mobility specialist  $Mobility charge 1 Mobility   Pt requesting to return to bed upon entering room d/t RLE pain, no rating given. Pt agreeable to ambulate this afternoon, will f/u as time allows. Call bell is at his side.   [facility] Denis Carreon Mobility Specialist Mobility Specialist Phone #1: [phone] Mobility Specialist Phone #2: [phone]

## 2021-07-22 NOTE — Progress Notes (Addendum)
[facility] [place]  Subjective  - doing better   Objective 115/73 91 98.5 F (36.9 C) (Oral) 16 99%  Intake/Output Summary (Last 24 hours) at 07/22/2021 0752 Last data filed at 07/22/2021 0700 Gross per 24 hour  Intake 360 ml  Output 2150 ml  Net -1790 ml        PT signal right LE Edema in the foot and ankle Wound beds have good granulation tissue Vac with SS drainage 500 cc total, 250 cc last 24 hours  Assessment/Planning: POD # 9 sharp excisional debridement of right groin and bilateral right lower leg fasciotomy incisions. Brisk multiphasic right PT Doppler signal. Wound VAC placed  Will maintain wound vac to right groin, Dakins dressing changes to fasciotomy sites with dry guaze and ABD pad ace for holding dressing in place. Elevation for edema right LE Mobility as tolerates. F/U arranged for 2 weeks.  [name] 07/22/2021 7:52 AM --  Laboratory Lab Results: Recent Labs    07/20/21 0618 07/21/21 0629  WBC 9.1 8.6  HGB 8.4* 8.5*  HCT 26.5* 26.7*  PLT 429* 441*   BMET Recent Labs    07/20/21 0618 07/21/21 0629  NA 127* 127*  K 4.5 4.8  CL 93* 93*  CO2 28 29  GLUCOSE 97 87  BUN 16 14  CREATININE 0.85 0.92  CALCIUM 8.1* 8.1*    COAG Lab Results  Component Value Date   INR 1.0 06/18/2021   INR 1.1 04/11/2021   INR 0.9 03/22/2021   No results found for: PTT  VASCULAR STAFF ADDENDUM: I have independently interviewed and examined the patient. I agree with the above.  Output from [facility] too high for discharge. Change VAC today. Continue local care to the calves.  Will trend output over the weekend. OK for discharge once output is less than 155mL/day.  [name]. Stanford Breed, MD [facility] Phone Number: [phone] 07/22/2021 9:40 AM

## 2021-07-22 NOTE — Progress Notes (Signed)
Progress Note - Vascular Surgery  Spoke with Vascular Surgery, Dr. Stanford Breed, about difficulty discharging patient this morning 2/2 wound vac volume being to high. Vascular surgery recommends to hold off discharging patient. Patient may need procedure to help with drainage volume, at time surgeon referenced plastic surgery to consider muscle flap. Patient was intended to d/c today to make oncology appointment at 11 am. Primary team will reach out to Oncology, Dr. Barbaraann Faster, about seeing the patient in hospital vs rescheduling appointment.  Dr. Holley Bouche FMPTS - PGY-1

## 2021-07-22 NOTE — Progress Notes (Signed)
Called Dr. Hazeline Junker nurse to cancel appointment today at 1100 and they will reach out to reschedule early next week if he discharges from hospital. Primary doctors made aware through secure chat. Pt made aware as well. Pt resting with call bell within reach.  Will continue to monitor.

## 2021-07-22 NOTE — Progress Notes (Signed)
Occupational Therapy Treatment Patient Details Name: Arthur Mcmahon MRN: [id number] DOB: 08-30-1966 Today's Date: 07/22/2021   History of present illness Pt is a 55 y.o. male admitted on 07/13/21 for necrosis and lymphatic leakage of RLE.  Debridement of fasciotomy incisions and wound VAC placement of groin incision on 11/09.  PMH includes HTN, PVD, anemia, s/p right thrombectomy and 4 compartment fasciotomies on 06/19/21.   OT comments  Patient received in bed and agreeable to OT treatment.  AE training performed for LB dressing with reacher for doffing socks and sock aide for donning. Patient performed task well only needing verbal cues for sequencing with socks aide.  Patient performed standing at EOB to address one handed tasks while standing to increase independence with ADL tasks standing at sink.  Patient was able to perform 2 stands for 2 minutes each with min guard.  Patient returned to supine due to nursing needing to dress wound.  Acute OT to continue to follow.    Recommendations for follow up therapy are one component of a multi-disciplinary discharge planning process, led by the attending physician.  Recommendations may be updated based on patient status, additional functional criteria and insurance authorization.    Follow Up Recommendations  Home health OT    Assistance Recommended at Discharge Intermittent Supervision/Assistance  Equipment Recommendations  None recommended by OT    Recommendations for Other Services      Precautions / Restrictions Precautions Precautions: Fall Precaution Comments: wound vac right groin area       Mobility Bed Mobility Overal bed mobility: Modified Independent             General bed mobility comments: able to go from supine <> sit EOB with use of bedrails and increased time    Transfers Overall transfer level: Needs assistance Equipment used: Rolling walker (2 wheels) Transfers: Sit to/from Stand Sit to Stand: Min guard            General transfer comment: performed sit to stands from EOB     Balance Overall balance assessment: Needs assistance Sitting-balance support: No upper extremity supported;Feet supported Sitting balance-Leahy Scale: Good Sitting balance - Comments: able to perform LB dressing seated on EOB without assistance for balance   Standing balance support: Bilateral upper extremity supported;Single extremity supported Standing balance-Leahy Scale: Poor Standing balance comment: performed standing from EOB to address standing at sink for self care. Performed reaching tasks to increase balance to perform one extremity tasks at sink                           ADL either performed or assessed with clinical judgement   ADL Overall ADL's : Needs assistance/impaired                     Lower Body Dressing: Cueing for sequencing;Supervision/safety Lower Body Dressing Details (indicate cue type and reason): doffed sock with reacher and donned with sock aide requiring verbal cues for reminders on sequencing               General ADL Comments: focused on AE training for LB dressing while seated on EOB    Extremity/Trunk Assessment              Vision       Perception     Praxis      Cognition Arousal/Alertness: Awake/alert Behavior During Therapy: WFL for tasks assessed/performed Overall Cognitive Status: Within Functional Limits for tasks  assessed                                 General Comments: Patient was able to recall sock aide use with min verbal cues          Exercises     Shoulder Instructions       General Comments      Pertinent Vitals/ Pain       Pain Assessment: Faces Faces Pain Scale: Hurts even more Pain Location: RLE and groin Pain Descriptors / Indicators: Discomfort;Operative site guarding;Throbbing Pain Intervention(s): Repositioned;Monitored during session  Home Living                                           Prior Functioning/Environment              Frequency  Min 2X/week        Progress Toward Goals  OT Goals(current goals can now be found in the care plan section)  Progress towards OT goals: Progressing toward goals  Acute Rehab OT Goals Patient Stated Goal: go home when better OT Goal Formulation: With patient Time For Goal Achievement: 07/29/21 Potential to Achieve Goals: Good ADL Goals Pt Will Perform Grooming: with modified independence;sitting;standing Pt Will Perform Lower Body Bathing: with modified independence;sit to/from stand Pt Will Perform Lower Body Dressing: with modified independence;with adaptive equipment;sit to/from stand Pt Will Transfer to Toilet: ambulating;regular height toilet;with set-up Pt Will Perform Toileting - Clothing Manipulation and hygiene: with modified independence;sit to/from stand Pt Will Perform Tub/Shower Transfer: with modified independence;ambulating;shower seat  Plan Discharge plan remains appropriate    Co-evaluation                 AM-PAC OT "6 Clicks" Daily Activity     Outcome Measure   Help from another person eating meals?: None Help from another person taking care of personal grooming?: None Help from another person toileting, which includes using toliet, bedpan, or urinal?: A Lot Help from another person bathing (including washing, rinsing, drying)?: A Lot Help from another person to put on and taking off regular upper body clothing?: None Help from another person to put on and taking off regular lower body clothing?: A Lot 6 Click Score: 18    End of Session Equipment Utilized During Treatment: Rolling walker (2 wheels)  OT Visit Diagnosis: Unsteadiness on feet (R26.81);Other abnormalities of gait and mobility (R26.89);Pain Pain - Right/Left: Right Pain - part of body: Leg   Activity Tolerance Patient limited by pain   Patient Left in bed;with call bell/phone within reach    Nurse Communication Mobility status        Time: 3474-2595 OT Time Calculation (min): 18 min  Charges: OT General Charges $OT Visit: 1 Visit OT Treatments $Self Care/Home Management : 8-22 mins  Lodema Hong, [place]  Pager [phone] Office [place] 07/22/2021, 2:37 PM

## 2021-07-22 NOTE — Progress Notes (Signed)
FPTS Brief Note Reviewed patient's vitals, recent notes.  Vitals:   07/21/21 1929 07/21/21 2309  BP: 113/71 118/77  Pulse: 86 81  Resp: 20 20  Temp: 98.6 F (37 C) 98.2 F (36.8 C)  SpO2: 99% 100%   At this time, no change in plan from day progress note. Plan for d/c in the AM pending vascular surgery coordination.   [name], DO Page [phone] with questions about this patient.

## 2021-07-22 NOTE — Progress Notes (Signed)
Family Medicine Teaching Service Daily Progress Note Intern Pager: [phone]  Arthur Mcmahon name: Arthur Mcmahon Medical record number: [id number] Date of birth: 1965-10-05 Age: 55 y.o. Gender: male  Primary Care Provider: Shary Key, DO Consultants: Vascular Surgery  Code Status: Full  Pt Overview and Major Events to Date:  11/9     Admitted s/p vascular surgery for right common femoral endarterectomy, lower extremity thrombectomy and fasciotomies  Assessment and Plan:  Arthur Mcmahon is a 55 year old male admitted postoperatively by vascular surgery after 11/9 operation.  He is currently postop day #5.  PMH includes metastatic bladder cancer with associated right hydroureteronephrosis and hematuria, bilateral edema, constipation, normocytic anemia, chronic hyponatremia, severe protein calorie malnutrition, tobacco use, PAD.   Necrosis of surgical incisions and lymphatic leakage s/p debridement and wound VAC placement  PAD POD #9. Nursing expressed concern in Arthur Mcmahon wound vac drainge volume being to high. Spoke with vascular surgery, the do not recommend discharge today, because of drain output. Will continue to monitor wound vac drainage. -Continue Tylenol q 6 prn -Gabapentin 400 mg TID -Percocet 5-325 mg q 4 prn -Vascular surgery following appreciate recs  Metastatic bladder cancer  right hydroureteronephrosis  Hematuria Arthur Mcmahon could not attend Oncology appointment 2/2 no availability in [facility] resources. Arthur Mcmahon has oncology appointment to be rescheduled for next week. Arthur Mcmahon has Urology appointment 08/18/21  Bilateral Leg Edema Arthur Mcmahon right leg with 3+ pitting edema to hip. No edema appreciated on left leg.   Constipation -Continue Miralax and Senna  FEN/GI: Regular diet PPx: Full dose eliquis Dispo:Home with home health  today.   Subjective:  Arthur Mcmahon laying comfortably in bed, no complaints.   Objective: Temp:  [98.2 F (36.8 C)-98.9 F (37.2 C)] 98.3 F (36.8  C) (11/18 0304) Pulse Rate:  [81-96] 85 (11/18 0304) Resp:  [16-20] 16 (11/18 0304) BP: (112-122)/(71-83) 116/83 (11/18 0304) SpO2:  [97 %-100 %] 97 % (11/18 0304) Physical Exam: General: Frail, thin, white mail, NASD Cardiovascular: RRR, NRMG Respiratory: CTABL Abdomen: Soft, NTTP, non-distended Extremities: Lower right extremity bandaged, wound vac in rt groin. Able to wiggle toes bilaterally. 3+ pitting edema in right leg up to hip.  Laboratory: Recent Labs  Lab 07/19/21 0800 07/20/21 0618 07/21/21 0629  WBC 9.0 9.1 8.6  HGB 9.6* 8.4* 8.5*  HCT 30.0* 26.5* 26.7*  PLT 474* 429* 441*   Recent Labs  Lab 07/19/21 0800 07/20/21 0618 07/21/21 0629  NA 126* 127* 127*  K 4.1 4.5 4.8  CL 88* 93* 93*  CO2 25 28 29   BUN 14 16 14   CREATININE 1.01 0.85 0.92  CALCIUM 8.1* 8.1* 8.1*  GLUCOSE 135* 97 87      Imaging/Diagnostic Tests:   Holley Bouche, MD 07/22/2021, 5:51 AM PGY-1, [place] Intern pager: [phone], text pages welcome

## 2021-07-23 ENCOUNTER — Inpatient Hospital Stay (HOSPITAL_COMMUNITY): Payer: Medicaid Other

## 2021-07-23 DIAGNOSIS — R609 Edema, unspecified: Secondary | ICD-10-CM

## 2021-07-23 MED ORDER — POLYETHYLENE GLYCOL 3350 17 G PO PACK
17.0000 g | PACK | Freq: Every day | ORAL | Status: DC | PRN
  Administered 2021-07-23 – 2021-07-26 (×2): 17 g via ORAL
  Filled 2021-07-23 (×2): qty 1

## 2021-07-23 MED ORDER — SENNA 8.6 MG PO TABS
1.0000 | ORAL_TABLET | Freq: Every day | ORAL | Status: DC | PRN
  Administered 2021-07-26: 8.6 mg via ORAL
  Filled 2021-07-23: qty 1

## 2021-07-23 NOTE — Progress Notes (Signed)
Mobility Specialist: Progress Note   07/23/21 1136  Mobility  Activity Transferred to/from [facility];Transferred:  Bed to chair  Level of Assistance Minimal assist, patient does 75% or more  Assistive Device Front wheel walker  Distance Ambulated (ft) 6 ft (2'+4')  Mobility Out of bed to chair with meals  Mobility Response Tolerated well  Mobility performed by Mobility specialist  Bed Position Chair  $Mobility charge 1 Mobility   Pt requesting to use BSC upon entering room, BM successful. Pt to recliner after per request. Will f/u as able for ambulation later today. Call bell is in reach.   [facility] Quinisha Mould Mobility Specialist Mobility Specialist Phone #1: [phone] Mobility Specialist Phone #2: [phone]

## 2021-07-23 NOTE — Progress Notes (Addendum)
[facility] [place]  Subjective  - No new complaints   Objective 117/72 89 98.4 F (36.9 C) (Oral) 18 100%  Intake/Output Summary (Last 24 hours) at 07/23/2021 0906 Last data filed at 07/23/2021 0400 Gross per 24 hour  Intake 360 ml  Output 1500 ml  Net -1140 ml    Wound vac right groin OP 400 cc last 24 hours.  Vac replaced 07/22/21 Fasciotomy sites healing well with continued evidence of pseudomonas continue dakins with BID dressing changes.  Changed today by me at bedside. Doppler flow intact, continued right foot edema    Assessment/Planning: POD # 10 sharp excisional debridement of right groin and bilateral right lower leg fasciotomy incisions. Brisk multiphasic right PT Doppler signal. Wound VAC placed  Will maintain wound vac to right groin, Dakins dressing changes to fasciotomy sites with dry guaze and ABD pad ace for holding dressing in place. Elevation for edema right LE Mobility as tolerates. OK for discharge once output is less than 162mL/day  [name] 07/23/2021 9:06 AM --  Laboratory Lab Results: Recent Labs    07/21/21 0629  WBC 8.6  HGB 8.5*  HCT 26.7*  PLT 441*   BMET Recent Labs    07/21/21 0629  NA 127*  K 4.8  CL 93*  CO2 29  GLUCOSE 87  BUN 14  CREATININE 0.92  CALCIUM 8.1*    COAG Lab Results  Component Value Date   INR 1.0 06/18/2021   INR 1.1 04/11/2021   INR 0.9 03/22/2021   No results found for: PTT  VASCULAR STAFF ADDENDUM: I have independently interviewed and examined the patient. I agree with the above.  Persistent, high-output lymph leak from R groin. Calf wounds cleaning up nicely with Dakins / daily changes. Will give through the weekend to see if output comes down with conservative management.  [name]. Stanford Breed, MD [facility] Phone Number: [phone] 07/23/2021 10:48 AM

## 2021-07-23 NOTE — Progress Notes (Signed)
Family Medicine Teaching Service Daily Progress Note Intern Pager: [phone]  Patient name: Arthur Mcmahon Medical record number: [id number] Date of birth: Aug 16, 1966 Age: 55 y.o. Gender: male  Primary Care Provider: Shary Key, DO Consultants: Vascular surgery Code Status: Full code  Pt Overview and Major Events to Date:  11/9 admitted after vascular surgery for right common femoral endarterectomy, lower extremity thrombectomy and fasciotomies with wound VAC placement  Assessment and Plan:  Arthur Mcmahon is a 55 year old male who was admitted postoperatively by vascular surgery after operation on 11/9.  Currently postop day 10.  Past medical history significant for normocytic anemia, chronic hyponatremia, severe protein calorie malnutrition, tobacco use, PAD, metastatic bladder cancer  POD #10 necrosis of surgical incisions and lymphatic leakage s/p debridement and wound VAC placement  PAD  LE Edema Vital signs overnight within normal limits with one heart rate that was elevated to 109.  Has remained afebrile.  Daily wound drain output was 400 mL. He relayed this was his main barrier to discharge as wound vac at home does not hold that much fluid.  Pain today is controlled with medication. He feels comfortable asking for percocet before his debridement sessions/wound vac changes. 3+ pitting edema up to hip on right leg. No edema on left leg. Patient aware that it could be related to his cancer.  -Vascular surgery following, appreciate recommendations, will likely need lower drain output before discharge -Tylenol 1000 mg every 6 hours as needed -Gabapentin 400 mg 3 times daily -Percocet 5-325 mg every 4 hours as needed -Venous doppler of LE given cancer/unilateral swelling and not done on this admission yet -No labs ordered given clinical stability and trends  Metastatic bladder cancer  right hydroureteronephrosis  hematuria Urine output in last 24 hours was 1.55 L. Denies any  dysuria, when pressing on suprapubic region just has urge to urinate but no pain. Oncology appointment was rescheduled for 08/05/2021.  -Monitor UOP -Oncology appointment 08/05/2021  -Urology appt 08/18/21  Constipation Bowel movement day before that was not hard. Denies any abdominal pain. Would like to to miralax every other day. -Miralax changed to prn 17 g daily given patient refusing doses -Senna changed to prn 1 tablet daily given patient refusing doses  FEN/GI: Regular diet PPx: Eliquis 5 mg BID Dispo:Home with home health  pending clinical improvement . Barriers include wound vac drainage.   Subjective:  No complaints this AM just wanting to know when his oncology appointment was  Objective: Temp:  [98.3 F (36.8 C)-98.9 F (37.2 C)] 98.6 F (37 C) (11/18 2300) Pulse Rate:  [83-109] 83 (11/18 2300) Resp:  [16-20] 18 (11/18 2300) BP: (115-134)/(67-83) 119/67 (11/18 2300) SpO2:  [91 %-99 %] 97 % (11/18 2300) Physical Exam: General: NAD, laying in bed resting Cardiovascular: RRR no m/r/g Respiratory: CTAB no w/r/c Abdomen: Soft, nontender, some urgency with palpation of suprapubic region. Extremities: RLE 3+ pitting edema to hip, no edema on LLE, pulses difficult to assess given swelling but good cap refill  Laboratory: Recent Labs  Lab 07/19/21 0800 07/20/21 0618 07/21/21 0629  WBC 9.0 9.1 8.6  HGB 9.6* 8.4* 8.5*  HCT 30.0* 26.5* 26.7*  PLT 474* 429* 441*   Recent Labs  Lab 07/19/21 0800 07/20/21 0618 07/21/21 0629  NA 126* 127* 127*  K 4.1 4.5 4.8  CL 88* 93* 93*  CO2 25 28 29   BUN 14 16 14   CREATININE 1.01 0.85 0.92  CALCIUM 8.1* 8.1* 8.1*  GLUCOSE 135* 97 87  Imaging/Diagnostic Tests:   Gerrit Heck, MD 07/23/2021, 1:19 AM PGY-1, [place] Intern pager: [phone], text pages welcome

## 2021-07-23 NOTE — Progress Notes (Signed)
FPTS Brief Note Reviewed patient's vitals, recent notes.  Vitals:   07/23/21 1721 07/23/21 1954  BP: 125/80 115/77  Pulse: 93 65  Resp: 18 17  Temp: 98.8 F (37.1 C) 97.7 F (36.5 C)  SpO2: 100% 96%   At this time, no change in plan from day progress note.  Alcus Dad, MD Page [phone] with questions about this patient.

## 2021-07-23 NOTE — Progress Notes (Signed)
VASCULAR LAB    Right lower extremity venous duplex has been performed.  See CV proc for preliminary results.   Elza Sortor, RVT 07/23/2021, 3:17 PM

## 2021-07-24 MED ORDER — ACETAMINOPHEN 500 MG PO TABS
1000.0000 mg | ORAL_TABLET | Freq: Four times a day (QID) | ORAL | Status: DC
  Administered 2021-07-24 – 2021-08-08 (×43): 1000 mg via ORAL
  Filled 2021-07-24 (×46): qty 2

## 2021-07-24 MED ORDER — ACETAMINOPHEN 325 MG PO TABS: 650.0000 mg | ORAL_TABLET | Freq: Four times a day (QID) | ORAL | Status: DC

## 2021-07-24 MED ORDER — OXYCODONE HCL 5 MG PO TABS
5.0000 mg | ORAL_TABLET | ORAL | Status: DC | PRN
  Administered 2021-07-24 – 2021-07-30 (×28): 10 mg via ORAL
  Filled 2021-07-24 (×31): qty 2

## 2021-07-24 MED ORDER — ACETAMINOPHEN 500 MG PO TABS: 1000.0000 mg | ORAL_TABLET | Freq: Four times a day (QID) | ORAL | Status: DC

## 2021-07-24 NOTE — Progress Notes (Signed)
FPTS Brief Note Reviewed patient's vitals, recent notes.  Vitals:   07/24/21 1645 07/24/21 1929  BP: 113/75 118/85  Pulse: 89 96  Resp: 17 20  Temp: 98.2 F (36.8 C) 98.5 F (36.9 C)  SpO2: 99% 100%   At this time, no change in plan from day progress note.  [name], DO Page [phone] with questions about this patient.

## 2021-07-24 NOTE — Progress Notes (Signed)
Family Medicine Teaching Service Daily Progress Note Intern Pager: [phone]  Patient name: Arthur Mcmahon Medical record number: [id number] Date of birth: 04/14/1966 Age: 55 y.o. Gender: male  Primary Care Provider: Shary Key, DO Consultants: Vascular surgery Code Status: Full Code  Pt Overview and Major Events to Date:  11/9 admitted after vascular surgery for right common femoral endarterectomy, lower extremity thrombectomy and fasciotomies with wound VAC placement    Assessment and Plan:  Arthur Mcmahon is a 55 year old male who was admitted postoperatively by vascular surgery after operation on 11/9.  Currently postop day 10.  Past medical history significant for normocytic anemia, chronic hyponatremia, severe protein calorie malnutrition, tobacco use, PAD, metastatic bladder cancer   POD #11 necrosis of surgical incisions and lymphatic leakage s/p debridement and wound VAC placement  PAD  LE Edema Patient is POD 11. Wound VAC drainage yesterday was 700 cc. Vascular surgery anticipates keeping patient until wound vac drainage is around 150 cc a day.  -Vascular surgery following, appreciate recs -Tylenol 600 mg q 6  -Gabapentin 400 mg TID -Percocet 5-325 q 4 hrs prn -No labs ordered as patient is clinically stable   Metastatic bladder cancer  right hydroureteronephrosis  hematuria UOP over past 24 hours was 1.950 L.  -Oncology appointment 08/05/21 -Urology appointment 08/18/21   Constipation -Miralax every other day, 17 g -Senna daily prn -Colace  100 mg BID prn   FEN/GI: Regular Diet PPx: Eliquis 5 mg BID Dispo:Home with home health  pending clinical improvement . Barriers include wound vac drainige.   Subjective:  Complains of pelvic ache today, thinks it's related to him using a wheelchair yesterday with his brother. Ache is in hips/pelvis and runs down his leg, with some abdominal ache as well.   Objective: Temp:  [97.7 F (36.5 C)-98.8 F (37.1 C)] 98.5  F (36.9 C) (11/20 0255) Pulse Rate:  [65-100] 84 (11/20 0255) Resp:  [17-20] 18 (11/20 0255) BP: (115-137)/(72-84) 137/78 (11/20 0255) SpO2:  [93 %-100 %] 93 % (11/20 0255) Physical Exam: General: Frail, white male, uncomfortable Cardiovascular: RRR, [facility] Respiratory: CTABL Abdomen: Soft, NTTP, with dull ache across lower abdomen Extremities: 3+ pitting edema in right leg, no edema in left, able to wiggle toes bilaterally.   Laboratory: Recent Labs  Lab 07/19/21 0800 07/20/21 0618 07/21/21 0629  WBC 9.0 9.1 8.6  HGB 9.6* 8.4* 8.5*  HCT 30.0* 26.5* 26.7*  PLT 474* 429* 441*   Recent Labs  Lab 07/19/21 0800 07/20/21 0618 07/21/21 0629  NA 126* 127* 127*  K 4.1 4.5 4.8  CL 88* 93* 93*  CO2 25 28 29   BUN 14 16 14   CREATININE 1.01 0.85 0.92  CALCIUM 8.1* 8.1* 8.1*  GLUCOSE 135* 97 87      Imaging/Diagnostic Tests:   Holley Bouche, MD 07/24/2021, 6:40 AM PGY-1, [place] Intern pager: [phone], text pages welcome

## 2021-07-24 NOTE — Progress Notes (Addendum)
[facility]  Subjective  - No new complaints has sharp pains that comes and goes in the right LE.  No frank rest pain.   Objective 120/69 91 98.7 F (37.1 C) (Oral) 18 99%  Intake/Output Summary (Last 24 hours) at 07/24/2021 0817 Last data filed at 07/24/2021 0805 Gross per 24 hour  Intake 960 ml  Output 3025 ml  Net -2065 ml    Doppler PT/Peroneal signals right LE Fasciotomy sites healing well with good granulation.  [place]akins wet to dry with decreased bluish/green color on old dressing Right foot edema without change, 5th toe dusky, partial darkening of the the tips of the GT and second toe.  Gt toe motor intact.  Appearance has improved since admission of the GT toe and second toe. Right groin wound vac to suction, firm tissue surrounding the groin, no skin compromise. Lungs non labored breathing O2 [place] [facility] RRR    Assessment/Planning: POD # 11 POD # 10 sharp excisional debridement of right groin and bilateral right lower leg fasciotomy incisions. Brisk multiphasic right PT Doppler signal. Wound VAC placed  Acute on chronic ischemia of the right lower extremity, Right lower extremity thrombectomy from femoral approach, Right lower extremity 4 compartment fasciotomy 06/18/21 Acute right LE ischemia s/p thrombectomy 06/19/21  He has doppler signals and the fasciotomy sites are healing well on the right LE. Continued edema.  Negative venous duplex 07/23/21 on the right LE. Contimed lymph drainage right groin wound vac MWF  700 ml/ last 24 hours OK for discharge once output is less than 146mL/day  [name] 07/24/2021 8:17 AM --  Laboratory Lab Results: No results for input(s): WBC, HGB, HCT, PLT in the last 72 hours. BMET No results for input(s): NA, K, CL, CO2, GLUCOSE, BUN, CREATININE, CALCIUM in the last 72 hours.  COAG Lab Results  Component Value Date   INR 1.0 06/18/2021   INR 1.1 04/11/2021   INR 0.9 03/22/2021    No results found for: PTT   VASCULAR STAFF ADDENDUM: I have independently interviewed and examined the patient. I agree with the above.   [name]. Stanford Breed, MD [facility] Phone Number: [phone] 07/24/2021 11:27 AM

## 2021-07-25 DIAGNOSIS — I890 Lymphedema, not elsewhere classified: Secondary | ICD-10-CM

## 2021-07-25 NOTE — Consult Note (Signed)
Reason for Consult/CC:  lymph leak right groin  Arthur Mcmahon is an 55 y.o. male.  HPI: 55 year old with a history of bladder cancer and complex vascular history including most recently excisional debridement of right groin and right lower leg fasciotomy excisions.  He currently has a high output lymph leak of his right groin being treated with a wound vac.  Output was 450 ml in the last 24 hours.  Plastics was consulted for management of his lymphatic leak.  Allergies: No Known Allergies  Medications:  Current Facility-Administered Medications:    acetaminophen (TYLENOL) tablet 1,000 mg, 1,000 mg, Oral, Q6H, Brimage, Vondra, DO, 1,000 mg at 07/25/21 1325   apixaban (ELIQUIS) tablet 5 mg, 5 mg, Oral, BID, Brimage, Vondra, DO, 5 mg at 07/25/21 2141   atorvastatin (LIPITOR) tablet 40 mg, 40 mg, Oral, Daily, Jinny Sanders, Mayuri, MD, 40 mg at 07/25/21 0938   docusate sodium (COLACE) capsule 100 mg, 100 mg, Oral, BID PRN, [name], MD   feeding supplement (ENSURE ENLIVE / ENSURE PLUS) liquid 237 mL, 237 mL, Oral, BID BM, Chambliss, Marshall L, MD, 237 mL at 07/25/21 1413   gabapentin (NEURONTIN) capsule 400 mg, 400 mg, Oral, TID, Dameron, Marisa, DO, 400 mg at 07/25/21 2141   multivitamin with minerals tablet 1 tablet, 1 tablet, Oral, Daily, Chambliss, Jeb Levering, MD, 1 tablet at 07/25/21 1829   nutrition supplement (JUVEN) (JUVEN) powder packet 1 packet, 1 packet, Oral, BID BM, Chambliss, Jeb Levering, MD, 1 packet at 07/25/21 1531   oxyCODONE (Oxy IR/ROXICODONE) immediate release tablet 5-10 mg, 5-10 mg, Oral, Q4H PRN, Brimage, Vondra, DO, 10 mg at 07/25/21 2027   polyethylene glycol (MIRALAX / GLYCOLAX) packet 17 g, 17 g, Oral, Daily PRN, Gerrit Heck, MD, 17 g at 07/23/21 1715   QUEtiapine (SEROQUEL) tablet 25 mg, 25 mg, Oral, QHS, Gifford Shave, MD, 25 mg at 07/25/21 2141   senna (SENOKOT) tablet 8.6 mg, 1 tablet, Oral, Daily PRN, Gerrit Heck, MD  Past Medical History:   Diagnosis Date   Anemia    Cancer ([place])    bladder   Constipation    ETOH abuse    Hypertension    Peripheral vascular disease ([place])    Pneumonia     Past Surgical History:  Procedure Laterality Date   ABDOMINAL AORTOGRAM W/LOWER EXTREMITY Bilateral 03/31/2021   Procedure: ABDOMINAL AORTOGRAM W/LOWER EXTREMITY;  Surgeon: [name], MD;  Location: [facility];  Service: Cardiovascular;  Laterality: Bilateral;   AMPUTATION Left 04/29/2021   Procedure: Left second Partial TOE AMPUTATION;  Surgeon: [name], MD;  Location: [place];  Service: Vascular;  Laterality: Left;   APPLICATION OF WOUND VAC Right 07/13/2021   Procedure: APPLICATION OF WOUND VAC OF RIGHT GROIN;  Surgeon: [name], MD;  Location: [place];  Service: Vascular;  Laterality: Right;   ENDARTERECTOMY FEMORAL Left 04/11/2021   Procedure: LEFT FEMORAL ENDARTERECTOMY;  Surgeon: [name], MD;  Location: [facility];  Service: Vascular;  Laterality: Left;   ENDARTERECTOMY FEMORAL Right 06/18/2021   Procedure: ENDARTERECTOMY RIGHT FEMORAL ARTERY;  Surgeon: [name], MD;  Location: [place];  Service: Vascular;  Laterality: Right;   FASCIOTOMY Right 06/18/2021   Procedure: RIGHT LOWER EXTREMITY FASCIOTOMY;  Surgeon: [name], MD;  Location: [place];  Service: Vascular;  Laterality: Right;   FRACTURE SURGERY Right    f[place]   INSERTION OF ILIAC STENT Left 04/11/2021   Procedure: INSERTION OF LEFT ABOVE KNEE  POPLITEAL STENT;  Surgeon: [name], MD;  Location: [facility];  Service: Vascular;  Laterality: Left;   INTRAOPERATIVE ARTERIOGRAM Left 04/11/2021   Procedure: INTRA OPERATIVE ARTERIOGRAM;  Surgeon: [name], MD;  Location: [facility];  Service: Vascular;  Laterality: Left;   PATCH ANGIOPLASTY Left 04/11/2021   Procedure: PATCH ANGIOPLASTY LEFT COMMON FEMORAL ARTERY, PROFUNDOPLASTY;  Surgeon: [name], MD;  Location: [place];  Service: Vascular;   Laterality: Left;   PATCH ANGIOPLASTY Right 06/18/2021   Procedure: PATCH ANGIOPLASTY USING Rueben Bash BIOLOGIC PATCH;  Surgeon: [name], MD;  Location: [facility];  Service: Vascular;  Laterality: Right;   THROMBECTOMY FEMORAL ARTERY Right 06/18/2021   Procedure: THROMBECTOMY RIGHT LOWER EXTREMITY;  Surgeon: [name], MD;  Location: [facility];  Service: Vascular;  Laterality: Right;   THROMBECTOMY FEMORAL ARTERY Right 06/19/2021   Procedure: THROMBECTOMY RIGHT LOWER EXTREMITY VIA POPITEAL APPROACH;  Surgeon: [name], MD;  Location: [facility];  Service: Vascular;  Laterality: Right;   TONSILLECTOMY     TRANSURETHRAL RESECTION OF BLADDER TUMOR N/A 07/04/2021   Procedure: TRANSURETHRAL RESECTION OF BLADDER TUMOR (TURBT), LEFT RETROGRADE PYELOGRAM;  Surgeon: [name], MD;  Location: [facility];  Service: Urology;  Laterality: N/A;   WOUND DEBRIDEMENT Right 07/13/2021   Procedure: DEBREDIEMENT OF RIGHT GROIN AND RIGHT LATERAL AND MEDIAL LOWER EXTREMITY FASCIOTOMY SITES;  Surgeon: [name], MD;  [facility];  Service: Vascular;  Laterality: Right;  GROIN AND LOWER LEG    Family History  Problem Relation Age of Onset   Cancer Mother    Heart failure Mother    Cancer Father     Social History:  reports that he has been smoking cigarettes. He has a 15.00 pack-year smoking history. He has never used smokeless tobacco. He reports that he does not currently use alcohol. He reports current drug use. Drug: Marijuana.  Physical Exam Blood pressure 135/82, pulse 97, temperature 98.6 F (37 C), temperature source Oral, resp. rate 20, height 6\' 2"  (1.88 m), weight 84.8 kg, SpO2 95 %. General: alert, conversational, nad Ext:  Wound vac intact right groin, dressings intact right fasciotomy sites, no surrounding erythema  No results found for this or any previous visit (from the past 48 hour(s)).  No results found.  Assessment/Plan: Agree with wound vac therapy  to the right groin as current management.  Muscle flap coverage with sartorious or other muscle is a consideration in the future but I think it would have a high failure rate in the current situation with possible lymphatic obstruction.  I agree with current wound vac therapy but can continue to follow this patients progress and consider options.  Ward time:  33 minutes of ward time were spend managing this patient including review of records, imaging, and previous notes.  Lennice Sites 07/25/2021, 10:30 PM

## 2021-07-25 NOTE — Progress Notes (Signed)
Family Medicine Teaching Service Daily Progress Note Intern Pager: [phone]  Patient name: Arthur Mcmahon Medical record number: [id number] Date of birth: Nov 01, 1965 Age: 55 y.o. Gender: male  Primary Care Provider: Shary Key, DO Consultants: Vascular surgery Code Status: Full Code  Pt Overview and Major Events to Date:  11/9 admitted after vascular surgery for right common femoral endarterectomy, lower extremity thrombectomy and fasciotomies with wound VAC placement  Assessment and Plan:  Arthur Mcmahon is a 55 year old male who was admitted postoperatively by vascular surgery after operation on 11/9.  Currently postop day 10.  Past medical history significant for normocytic anemia, chronic hyponatremia, severe protein calorie malnutrition, tobacco use, PAD, metastatic bladder cancer   POD #12 necrosis of surgical incisions and lymphatic leakage s/p debridement and wound VAC placement  PAD  LE Edema Patient POD 12, Vac drainage yesterday was 450 cc today. Vascular surgery ok with patient going home once he is draining less than 150 cc in a day. Vascular surgery to reach out to plastic surgery for evaluation of persistent high volume lymph leak.  -Wound vac changes MWF, RLE dressing daily. -Tylenol 1000 mg q 6 -Gabapentin 400 mg TID -Oxycodone 5-10 mg q 4 prn  Metastatic bladder cancer  right hydroureteronephrosis  hematuria UOP over past 24 hours was 1.55 L -Oncology appointment 08/05/21 -Urology appointment 08/18/21  Constipation Miralax every other day, 17 g Senna daily prn Colace 100 mg BID prn  FEN/GI: Regular diet PPx: Eliquis 5 mg BID Dispo:Home with home health  pending clinical improvement . Barriers include wound vac drainage.   Subjective:  Patient lying in bed, complains of some pain because LE dressing was just changed. Reports its 6-7/10.   Objective: Temp:  [98.2 F (36.8 C)-98.8 F (37.1 C)] 98.8 F (37.1 C) (11/21 0340) Pulse Rate:  [84-97] 97  (11/21 0340) Resp:  [17-20] 18 (11/21 0340) BP: (113-123)/(66-85) 122/79 (11/21 0340) SpO2:  [98 %-100 %] 99 % (11/21 0340) Physical Exam: General: Frail, thin, white male, NASD Cardiovascular: RRR, [facility] Respiratory: CTABL Abdomen: Soft, NTTP, non-distended Extremities: 3+ pitting edema in right leg up to level of hips. No edema in left leg. Able to wiggle toes bilaterally.   Laboratory: Recent Labs  Lab 07/19/21 0800 07/20/21 0618 07/21/21 0629  WBC 9.0 9.1 8.6  HGB 9.6* 8.4* 8.5*  HCT 30.0* 26.5* 26.7*  PLT 474* 429* 441*   Recent Labs  Lab 07/19/21 0800 07/20/21 0618 07/21/21 0629  NA 126* 127* 127*  K 4.1 4.5 4.8  CL 88* 93* 93*  CO2 25 28 29   BUN 14 16 14   CREATININE 1.01 0.85 0.92  CALCIUM 8.1* 8.1* 8.1*  GLUCOSE 135* 97 87      Imaging/Diagnostic Tests:   Arthur Bouche, MD 07/25/2021, 5:42 AM PGY-1, [place] Intern pager: [phone], text pages welcome

## 2021-07-25 NOTE — Progress Notes (Signed)
FPTS Brief Note Reviewed patient's vitals, recent notes.  Vitals:   07/25/21 1729 07/25/21 2009  BP: 121/82 135/82  Pulse: 87 97  Resp: 17 20  Temp: 98 F (36.7 C) 98.6 F (37 C)  SpO2: 100% 95%   At this time, no change in plan from day progress note.   [name], DO Page [phone] with questions about this patient.

## 2021-07-25 NOTE — Progress Notes (Signed)
Physical Therapy Treatment Patient Details Name: Arthur Mcmahon MRN: [id number] DOB: Jun 19, 1966 Today's Date: 07/25/2021   History of Present Illness Pt is a 55 y.o. male admitted on 07/13/21 for necrosis and lymphatic leakage of RLE.  Debridement of fasciotomy incisions and wound VAC placement of groin incision on 11/09.  PMH includes HTN, PVD, anemia, s/p right thrombectomy and 4 compartment fasciotomies on 06/19/21.    PT Comments    Pt is progressing with mobility. Pt was limited by pain today, but agreeable to therapy with encouragement and education about the importance of mobility. Today's session focused on gait training and progressing ambulation to increase activity tolerance. Continue to recommend [facility] PT after discharge to increase strength, functional mobility, and activity tolerance. Will continue to follow acutely to address short-term PT goals.    Recommendations for follow up therapy are one component of a multi-disciplinary discharge planning process, led by the attending physician.  Recommendations may be updated based on patient status, additional functional criteria and insurance authorization.  Follow Up Recommendations  Home health PT     Assistance Recommended at Discharge [facility] bed    Recommendations for Other Services       Precautions / Restrictions Precautions Precautions: Fall Precaution Comments: wound vac right groin area Restrictions Weight Bearing Restrictions: Yes RLE Weight Bearing: Weight bearing as tolerated     Mobility  Bed Mobility Overal bed mobility: Modified Independent Bed Mobility: Supine to Sit     Supine to sit: Modified independent (Device/Increase time);HOB elevated Sit to supine: Modified independent (Device/Increase time);HOB elevated   General bed mobility comments: able to go from supine <> sit EOB with use of bedrails and increased time     Transfers Overall transfer level: Needs assistance Equipment used: Rolling walker (2 wheels) Transfers: Sit to/from Stand Sit to Stand: Min guard           General transfer comment: able to stand from EOB and recliner 2x with min guard for safety; good use of UE with RW    Ambulation/Gait Ambulation/Gait assistance: Min guard Gait Distance (Feet): 16 Feet (+28, +2) Assistive device: Rolling walker (2 wheels) Gait Pattern/deviations: Step-to pattern;Decreased stride length;Decreased weight shift to right;Antalgic Gait velocity: decreased     General Gait Details: ambulated in hall with chair follow; required seated rest break (~5 min) due to pain and fatigue; min guard for safety, cues for RW management and upright posture;  declined further ambulation due to pain   Stairs             Wheelchair Mobility    Modified Rankin (Stroke Patients Only)       Balance Overall balance assessment: Needs assistance Sitting-balance support: No upper extremity supported;Feet supported Sitting balance-Leahy Scale: Good Sitting balance - Comments: able to sit EOB with no UE support   Standing balance support: Bilateral upper extremity supported;Single extremity supported Standing balance-Leahy Scale: Poor Standing balance comment: reliant on BUE with RW for static standing and ambulation                            Cognition Arousal/Alertness: Awake/alert Behavior During Therapy: WFL for tasks assessed/performed Overall Cognitive Status: Within Functional Limits for tasks assessed  Exercises      General Comments General comments (skin integrity, edema, etc.): Pt "did not see much use" in mobilizing today since he is possbily having another surgery tomorrow. Educated him on the importance of mobility.      Pertinent Vitals/Pain Faces Pain Scale: Hurts a little bit Pain Location: RLE and groin Pain  Descriptors / Indicators: Aching;Discomfort Pain Intervention(s): Limited activity within patient's tolerance;Monitored during session    Home Living                          Prior Function            PT Goals (current goals can now be found in the care plan section) Acute Rehab PT Goals Patient Stated Goal: to go home PT Goal Formulation: With patient Time For Goal Achievement: 07/29/21 Potential to Achieve Goals: Good Progress towards PT goals: Progressing toward goals    Frequency    Min 3X/week      PT Plan Current plan remains appropriate    Co-evaluation              AM-PAC PT "6 Clicks" Mobility   Outcome Measure  Help needed turning from your back to your side while in a flat bed without using bedrails?: A Little Help needed moving from lying on your back to sitting on the side of a flat bed without using bedrails?: A Little Help needed moving to and from a bed to a chair (including a wheelchair)?: A Little Help needed standing up from a chair using your arms (e.g., wheelchair or bedside chair)?: A Little Help needed to walk in hospital room?: A Little Help needed climbing 3-5 steps with a railing? : A Lot 6 Click Score: 17    End of Session Equipment Utilized During Treatment: Gait belt Activity Tolerance: Patient limited by pain Patient left: in bed;with call bell/phone within reach Nurse Communication: Mobility status PT Visit Diagnosis: Other abnormalities of gait and mobility (R26.89);Muscle weakness (generalized) (M62.81);Unsteadiness on feet (R26.81);Difficulty in walking, not elsewhere classified (R26.2);Pain Pain - Right/Left: Right Pain - part of body: Leg     Time: 1310-1341 PT Time Calculation (min) (ACUTE ONLY): 31 min  Charges:  $Gait Training: 8-22 mins $Therapeutic Exercise: 8-22 mins                     Arthur Mcmahon, SPT    Arthur Mcmahon 07/25/2021, 2:26 PM

## 2021-07-25 NOTE — Progress Notes (Signed)
Late entry for 07/13/21. Skin assessment on admission revealed stage 2 on right buttocks as well as stage 2 pressure ulcer on coccyx. Both present on admission. Patient stated he has not been getting up much since last admission due to pain in right leg. Areas cleansed and foam dressing applied. Fuller Canada, RN

## 2021-07-25 NOTE — Progress Notes (Addendum)
[facility] [place]   Assessment/Planning: POD # 12 sharp excisional debridement of right groin and bilateral right lower leg fasciotomy incisions. Brisk multiphasic right PT Doppler signal. Wound VAC placed  Fasciotomy site healing well.   Drainage decreased drainage to 450 cc lymph fluid.  We would like it to be < 150 cc in 24 hours prior to discharge. Edema with lymphadenopathy.  He may benefit from lymph edema pumps on an OP basis. The right GT, second and fifth toes without open wounds will observe.    Subjective  - No new complaints   Objective 122/79 97 98.8 F (37.1 C) (Oral) 18 99%  Intake/Output Summary (Last 24 hours) at 07/25/2021 0749 Last data filed at 07/25/2021 0340 Gross per 24 hour  Intake --  Output 2000 ml  Net -2000 ml    Doppler signal right PT/Peroneal Right foot edema.  He does not tolerated ace compression will try to continue to elevate. Fasciotomy sites healing well with beefy red base, decreased appearence of bluish.green drainage.  Wet to dry Dakins dressing change performed at bedside Wound vac to suction, plan for vac change later today by RN.  OP 450 cc last 24 hours  lymph fluid. Lungs non labored breathing     Arthur Mcmahon 07/25/2021 7:49 AM --  Laboratory Lab Results: No results for input(s): WBC, HGB, HCT, PLT in the last 72 hours. BMET No results for input(s): NA, K, CL, CO2, GLUCOSE, BUN, CREATININE, CALCIUM in the last 72 hours.  COAG Lab Results  Component Value Date   INR 1.0 06/18/2021   INR 1.1 04/11/2021   INR 0.9 03/22/2021   No results found for: PTT  VASCULAR STAFF ADDENDUM: I have independently interviewed and examined the patient. I agree with the above.  Will ask plastic surgery to evaluate patient for persistent high volume lymph leak.  Arthur Mcmahon. Stanford Breed, MD [facility] Phone Number: [phone] 07/25/2021 11:29 AM

## 2021-07-25 NOTE — Progress Notes (Signed)
Mobility Specialist: Progress Note   07/25/21 1616  Mobility  Activity Ambulated in room  Level of Assistance Standby assist, set-up cues, supervision of patient - no hands on  Assistive Device Front wheel walker  Distance Ambulated (ft) 20 ft  Mobility Ambulated with assistance in room  Mobility Response Tolerated well  Mobility performed by Mobility specialist  $Mobility charge 1 Mobility   Pt c/o pain in his RLE during ambulation, no rating given, as well as fatigue from pervious walk with PT today. Pt back to bed after walk with call bell and phone at his side.   [facility] Sharin Altidor Mobility Specialist Mobility Specialist Phone #1: [phone] Mobility Specialist Phone #2: [phone]

## 2021-07-25 NOTE — Progress Notes (Signed)
PT Cancellation Note  Patient Details Name: Arthur Mcmahon MRN: [id number] DOB: [birth year]   Cancelled Treatment:    Reason Eval/Treat Not Completed: Pain limiting ability to participate, requesting PT return after he is able to get pain meds at 11A. Will follow-up for PT treatment as schedule permits.  Mabeline Caras, PT, DPT Acute Rehabilitation Services  Pager [phone] Office [place] 07/25/2021, 8:04 AM

## 2021-07-26 NOTE — Progress Notes (Signed)
Nutrition Follow-up  DOCUMENTATION CODES:   Severe malnutrition in context of chronic illness  INTERVENTION:   Continue Ensure Enlive po BID, each supplement provides 350 kcal and 20 grams of protein Continue Juven BID, each packet provides 80 calories, 8 grams of carbohydrate, 2.5  grams of protein (collagen), 7 grams of L-arginine and 7 grams of L-glutamine; supplement contains CaHMB, Vitamins C, E, B12 and Zinc to promote wound healing Continue MVI with minerals daily Add extra gravy to meal trays  NUTRITION DIAGNOSIS:   Severe Malnutrition related to chronic illness (PVD) as evidenced by percent weight loss, severe muscle depletion.  Ongoing  GOAL:   Patient will meet greater than or equal to 90% of their needs  Progressing  MONITOR:   PO intake, Supplement acceptance, Labs, Weight trends, Skin, I & O's  REASON FOR ASSESSMENT:   Consult Assessment of nutrition requirement/status, Poor PO, Wound healing  ASSESSMENT:   55 yo male with a PMH of metastatic bladder cancer with lymphadenopathy in the groin, HTN, EtOH abuse, PVD, and anemia who presents with wound VAC placement of groin incision today for ongoing lymphatic leak and debridement of necrotic lower extremity fasciotomy incisions.  Fasciotomy sites healing well per VVS notes. VAC output > 400 ml in canister. Only 100 ml output documented x 24 hours.  Patient reports that he has been eating well.  He is doing okay, has good and bad days.  He drank 2 Ensure Enlive supplements and 2 Juven supplements yesterday.  He says that some of the meats are too tough for him to eat because he has bad teeth. Will add extra gravy on meal trays so patient can add to meats to hopefully make them easier to chew.   Remains on a regular diet; meal intakes 75-100% Supplements: Juven BID, Ensure Enlive/Plus BID  11/17 Labs reviewed.  Medications reviewed and include MVI with minerals, Juven.  No new weight available since  11/9.  Diet Order:   Diet Order             Diet regular Room service appropriate? Yes with Assist; Fluid consistency: Thin  Diet effective now                   EDUCATION NEEDS:   Education needs have been addressed  Skin:  Skin Assessment: Skin Integrity Issues: Skin Integrity Issues:: Wound VAC, Stage II Stage II: R buttocks, coccyx Wound Vac: R groin  Last BM:  11/20  Height:   Ht Readings from Last 1 Encounters:  07/13/21 6\' 2"  (1.88 m)    Weight:   Wt Readings from Last 1 Encounters:  07/13/21 84.8 kg   BMI:  Body mass index is 24.01 kg/m.  Estimated Nutritional Needs:   Kcal:  2300-2500  Protein:  110-130 grams  Fluid:  >2.3 L   Lucas Mallow, RD, LDN, CNSC Please refer to Amion for contact information.

## 2021-07-26 NOTE — TOC Progression Note (Signed)
Transition of Care (TOC) - Progression Note  Marvetta Gibbons RN, BSN Transitions of Care Unit 4E- RN Case Manager See Treatment Team for direct phone #    Patient Details  Name: Arthur Mcmahon MRN: [id number] Date of Birth: 1965-10-27  Transition of Care [facility]) CM/SW Contact  Dahlia Client Romeo Rabon, RN Phone Number: 07/26/2021, 2:23 PM  Clinical Narrative:    Noted drainage down a bit to 450ml per day. Rounding team asking again about home VAC options. Again reached out to [place]- per conversation with Marcelle Smiling would still have concern about sending pt home with drainage still being so high. Home VAC canisters can not be self emptied or reused- they can only be changed out. Verified with Olivia Mackie amount that home canister can hold - which is 327ml- discussed with Olivia Mackie again option for extra canisters at home- per Olivia Mackie they could probably request for extra canisters to cover a daily canister change if pt's drainage decreases to around 300 ml/per day so that he only needed to change out the canister once per day. This is still a lot for home management but more reasonable as far as extra canisters and HH management.  Rounding team as been made aware of above.   TOC to follow and will reach back out to [facility] as needed regarding home VAC needs.- pt's home VAC remains at bedside.    Expected Discharge Plan: [place] Barriers to Discharge: Continued Medical Work up  Expected Discharge Plan and Services Expected Discharge Plan: [place] In-house Referral: Clinical Social Work Discharge Planning Services: CM Consult Post Acute Care Choice: Home Health, Resumption of Svcs/PTA Provider Living arrangements for the past 2 months: Single Family Home                 DME Arranged: Vac, Bedside commode DME Agency: [employer], Soyla Murphy Date DME Agency Contacted: 07/18/21 Time DME Agency Contacted: 1100 Representative spoke with at DME Agency: Olivia Mackie  w/ [employer] HH Arranged: RN, PT [facility] Agency: [place] ([place]) Date [place]: 07/18/21 Time [place]: 1000 Representative spoke with at [place]: [place] ([place]) Interventions    Readmission Risk Interventions Readmission Risk Prevention Plan 07/04/2021 06/22/2021  Post Dischage Appt - Complete  Medication Screening - Complete  Transportation Screening Complete Complete  PCP or Specialist Appt within 5-7 Days Complete -  Home Care Screening Complete -  Medication Review (RN CM) Complete -  Some recent data might be hidden

## 2021-07-26 NOTE — Progress Notes (Signed)
PT Cancellation Note  Patient Details Name: BARBARA KENG MRN: [id number] DOB: [birth year]   Cancelled Treatment:    Reason Eval/Treat Not Completed: Patient declined, no reason specified. Reports he was not feeling well today and that he would work with [place] tomorrow.   [name], SPT  [name] 07/26/2021, 2:51 PM

## 2021-07-26 NOTE — Progress Notes (Signed)
Family Medicine Teaching Service Daily Progress Note Intern Pager: [phone]  Patient name: Arthur Mcmahon Medical record number: [id number] Date of birth: 02-Jun-1966 Age: 55 y.o. Gender: male  Primary Care Provider: Shary Key, DO Consultants: Vascular Surgery, Plastic Surgery Code Status: Full  Pt Overview and Major Events to Date:  11/9 admitted after vascular surgery for right common femoral endarterectomy, lower extremity thrombectomy and fasciotomies with wound VAC placement  Assessment and Plan:  Arthur Mcmahon is a 55 year old male who was admitted postoperatively by vascular surgery after operation on 11/9.  Currently postop day 10.  Past medical history significant for normocytic anemia, chronic hyponatremia, severe protein calorie malnutrition, tobacco use, PAD, metastatic bladder cancer   POD #13 necrosis of surgical incisions and lymphatic leakage s/p debridement and wound VAC placement  PAD  LE Edema POD 13, patient vac drainage this morning was 350. Plastic surgery following but does not believe muscle flap is solution to drainage issue. Spoke with wound care, and they suggest patient be sent home with extra case of canisters for wound vac. Canisters are non-reusable and hold 100-200 cc.  -Tylenol 1000 mg q6hr -Gabepentin 400 mg TID -Oxycodone 5-10 mg q4hr prn  Metastatic bladder cancer  right hydroureteronephrosis  hematuria UOP 1500 mL Drain output 350 this morning -Oncology appointment 08/05/21 -Urology appointment 08/18/21  Constipation -Continue Miralax every other day, 17 g -Senna daily prn -Colace 100 mg BID prn  FEN/GI: Regular diet PPx: Eliquis 5 mg BID Dispo:Home with home health  in 3 or more days. Barriers include wound vac drainage.   Subjective:  Patient laying in bed, repots pain is 7/10, just had bandage change.   Objective: Temp:  [98 F (36.7 C)-98.7 F (37.1 C)] 98.7 F (37.1 C) (11/21 2328) Pulse Rate:  [85-100] 94 (11/21  2328) Resp:  [17-20] 20 (11/21 2328) BP: (110-135)/(76-96) 110/96 (11/21 2328) SpO2:  [95 %-100 %] 100 % (11/21 2328) Physical Exam: General: Frail, white male, NASD Cardiovascular: RRR, NRMG Respiratory: CTABL Abdomen: Soft, NTTP, Non-distended Extremities: Moving all extremities independently, 3+ pitting edema in right leg to level of hip. No edema in left leg.   Laboratory: Recent Labs  Lab 07/19/21 0800 07/20/21 0618 07/21/21 0629  WBC 9.0 9.1 8.6  HGB 9.6* 8.4* 8.5*  HCT 30.0* 26.5* 26.7*  PLT 474* 429* 441*   Recent Labs  Lab 07/19/21 0800 07/20/21 0618 07/21/21 0629  NA 126* 127* 127*  K 4.1 4.5 4.8  CL 88* 93* 93*  CO2 25 28 29   BUN 14 16 14   CREATININE 1.01 0.85 0.92  CALCIUM 8.1* 8.1* 8.1*  GLUCOSE 135* 97 87      Imaging/Diagnostic Tests:   Holley Bouche, MD 07/26/2021, 6:27 AM PGY-1, [place] Intern pager: [phone], text pages welcome

## 2021-07-26 NOTE — Progress Notes (Addendum)
  Progress Note    07/26/2021 7:00 AM 13 Days Post-Op  Subjective:  says he is feeling better.  Says his wounds on his legs are getting better.   afebrile  Vitals:   07/25/21 2009 07/25/21 2328  BP: 135/82 (!) 110/96  Pulse: 97 94  Resp: 20 20  Temp: 98.6 F (37 C) 98.7 F (37.1 C)  SpO2: 95% 100%    Physical Exam: General:  no distress Lungs:  non labored Incisions:  wounds on lower legs are improving Extremities:  +doppler signal right PT; +RLE edema  CBC    Component Value Date/Time   WBC 8.6 07/21/2021 0629   RBC 2.88 (L) 07/21/2021 0629   HGB 8.5 (L) 07/21/2021 0629   HCT 26.7 (L) 07/21/2021 0629   PLT 441 (H) 07/21/2021 0629   MCV 92.7 07/21/2021 0629   MCH 29.5 07/21/2021 0629   MCHC 31.8 07/21/2021 0629   RDW 14.6 07/21/2021 0629   LYMPHSABS 0.7 07/21/2021 0629   MONOABS 0.8 07/21/2021 0629   EOSABS 0.2 07/21/2021 0629   BASOSABS 0.0 07/21/2021 0629    BMET    Component Value Date/Time   NA 127 (L) 07/21/2021 0629   K 4.8 07/21/2021 0629   CL 93 (L) 07/21/2021 0629   CO2 29 07/21/2021 0629   GLUCOSE 87 07/21/2021 0629   BUN 14 07/21/2021 0629   CREATININE 0.92 07/21/2021 0629   CALCIUM 8.1 (L) 07/21/2021 0629   GFRNONAA >60 07/21/2021 0629   GFRAA >60 12/01/2019 2208    INR    Component Value Date/Time   INR 1.0 06/18/2021 1550     Intake/Output Summary (Last 24 hours) at 07/26/2021 0700 Last data filed at 07/26/2021 0500 Gross per 24 hour  Intake 480 ml  Output 1600 ml  Net -1120 ml     Assessment/Plan:  55 y.o. male is s/p:  sharp excisional debridement of right groin and bilateral right lower leg fasciotomy incisions. Brisk multiphasic right PT Doppler signal. Wound VAC placed  13 Days Post-Op   -pt with right PT doppler signal -dressings removed on fasciotomy sites and they are healing nicely.   -400cc in the wound vac canister.  Appreciate Dr. Erin Hearing input - he feels we could consider Sartorious flap, however, given  possible lymphatic obstruction, it would have a high failure rate.   -continue strict I&O recording of vac output.   -DVT prophylaxis:  Eliquis   Leontine Locket, PA-C [facility] [phone] 07/26/2021 7:00 AM  VASCULAR STAFF ADDENDUM: I have independently interviewed and examined the patient. I agree with the above.  Greatly appreciate Dr. Erin Hearing input. No good options for persistent high volume lymph leak. Recommend continued VAC therapy to groin. Will arrange to see Dr. Carlis Abbott in the next 1-2 weeks.  If patient feels comfortable managing VAC at home, he is safe for discharge from my standpoint.  Needs home health for [facility] and calf wound care. Please call for questions.  [name]. Stanford Breed, MD [facility] Phone Number: [phone] 07/26/2021 9:56 AM

## 2021-07-26 NOTE — Progress Notes (Signed)
FPTS Brief Note Reviewed patient's vitals, recent notes.  Vitals:   07/26/21 1551 07/26/21 1943  BP: 121/79 114/72  Pulse: 87 83  Resp: 16 20  Temp: 98.7 F (37.1 C) 98.3 F (36.8 C)  SpO2: 98% 99%   At this time, no change in plan from day progress note.  [name], DO Page [phone] with questions about this patient.

## 2021-07-26 NOTE — Progress Notes (Signed)
OT Cancellation Note  Patient Details Name: SANG BLOUNT MRN: [id number] DOB: 1965-12-02   Cancelled Treatment:    Reason Eval/Treat Not Completed: Patient declined, no reason specified (patient was sleeping and asked OT if he could rest longer and come back later today.) Lodema Hong, [place]  Pager [phone] Office [phone]  Trixie Dredge 07/26/2021, 9:20 AM

## 2021-07-26 NOTE — Progress Notes (Signed)
OT Cancellation Note  Patient Details Name: Arthur Mcmahon MRN: [id number] DOB: October 23, 1965   Cancelled Treatment:    Reason Eval/Treat Not Completed: Patient declined, no reason specified (patient stated he was having a bad morning and asked for OT to try later) Lodema Hong, [place]  Pager [phone] Office [phone]  Trixie Dredge 07/26/2021, 10:52 AM

## 2021-07-26 NOTE — Progress Notes (Signed)
OT Cancellation Note  Patient Details Name: Arthur Mcmahon MRN: [id number] DOB: 12-Aug-1966   Cancelled Treatment:    Reason Eval/Treat Not Completed: Patient declined, no reason specified (patient asked to not participate today due to not feeling well.) Lodema Hong, [place]  Pager [phone] Office [place] 07/26/2021, 1:52 PM

## 2021-07-27 DIAGNOSIS — K5903 Drug induced constipation: Secondary | ICD-10-CM

## 2021-07-27 MED ORDER — POLYETHYLENE GLYCOL 3350 17 G PO PACK
17.0000 g | PACK | Freq: Two times a day (BID) | ORAL | Status: DC
  Administered 2021-07-27 – 2021-07-28 (×3): 17 g via ORAL
  Filled 2021-07-27 (×3): qty 1

## 2021-07-27 MED ORDER — FLEET ENEMA 7-19 GM/118ML RE ENEM: 1.0000 | ENEMA | Freq: Once | RECTAL | Status: DC

## 2021-07-27 MED ORDER — SENNA 8.6 MG PO TABS
1.0000 | ORAL_TABLET | Freq: Every day | ORAL | Status: DC
  Administered 2021-07-28 – 2021-07-29 (×2): 8.6 mg via ORAL
  Filled 2021-07-27 (×2): qty 1

## 2021-07-27 MED ORDER — POLYETHYLENE GLYCOL 3350 17 G PO PACK: 34.0000 g | PACK | Freq: Two times a day (BID) | ORAL | Status: DC

## 2021-07-27 MED ORDER — FLEET ENEMA 7-19 GM/118ML RE ENEM
1.0000 | ENEMA | Freq: Once | RECTAL | Status: AC
  Administered 2021-07-27: 1 via RECTAL
  Filled 2021-07-27: qty 1

## 2021-07-27 NOTE — Progress Notes (Signed)
FPTS Brief Note Reviewed patient's vitals, recent notes.  Vitals:   07/27/21 1740 07/27/21 1930  BP:  113/85  Pulse: 88   Resp: 20 16  Temp: 98 F (36.7 C) 98.1 F (36.7 C)  SpO2: 100% 100%   At this time, no change in plan from day progress note.  [name], DO Page [phone] with questions about this patient.

## 2021-07-27 NOTE — Progress Notes (Signed)
Family Medicine Teaching Service Daily Progress Note Intern Pager: [phone]  Patient name: Arthur Mcmahon Medical record number: [id number] Date of birth: 1966-01-26 Age: 55 y.o. Gender: male  Primary Care Provider: Shary Key, DO Consultants: Vascular surgery, plastic surgery Code Status: Full code  Pt Overview and Major Events to Date:  11/9: admitted after vascular surgery for right common femoral endarterectomy, lower extremity thrombectomy and fasciotomies with wound VAC placement due to ongoing lymphatic leak   Assessment and Plan: Arthur Mcmahon is a 55 y.o. male who was admitted postoperatively after a right common femoral endarterectomy, lower extremity thrombectomy and fasciotomies now with wound VAC placement.  He continues to have significant drainage from wound VAC which is a barrier to discharge.  He has a past medical history significant for normocytic anemia, chronic hyponatremia, severe protein calorie malnutrition, tobacco use, peripheral artery disease, and metastatic bladder cancer.   Status postdebridement of necrosis of surgical incisions Wound VAC in place  lymphatic leakage Drain output 400 cc.  -Continue MWF wound vac changes  -BID fasciotomy changes  -Continue pain regimen with Tylenol 1000 mg q6h, Gabapentin 400 mg TID, Oxycodone 5-10 mg q4h PRN for severe and breakthrough pain   Peripheral Artery Disease -Continue atorvastatin 40 mg daily  Metastatic urothelial carcinoma of the bladder Unfortunately his oncology appointment had to be pushed back given continued hospitalization.  He continues to have significant drain output which is a barrier to discharging him.  -Has oncology appointment on 12/2 -Has urology appointment on 12/15 -Pain regimen as above  Constipation: Improved Received a Fleet enema yesterday and was able to have a bowel movement. -Bowel regimen with MiraLAX, senna, colace  Protein calorie malnutrition -Continue nutritional  supplements, MVI  Tobacco Use Disorder -Continue to encourage cessation  FEN/GI: Regular diet PPx: Eliquis BID Dispo:Home with home health   Barriers include wound vac drain output..   Subjective:  Patient states he is feeling "not too good the last couple of days" and attributes it to being constipated. He thankfully was able to have a bowel movement yesterday. He continues to have some leg pain, especially with the bandage changes. He understandably is feeling frustrated that his continued output is keeping him hospitalized.   Objective: Temp:  [97.9 F (36.6 C)-98.7 F (37.1 C)] 98.5 F (36.9 C) (11/24 0305) Pulse Rate:  [63-102] 88 (11/23 2316) Resp:  [16-20] 16 (11/24 0305) BP: (105-129)/(74-85) 114/79 (11/24 0305) SpO2:  [100 %] 100 % (11/24 0305) Physical Exam: General: chronically ill appearing and cachectic male sitting up in bed, in no acute distress, cooperative with examination Cardiovascular: RRR Respiratory: CTAB without wheezing/rhonchi/rales Abdomen: Hyperactive bowel sounds, non-distended, soft, non-tender in all quadrants  Extremities: right lower extremity is bandaged, 3-4+ pitting edema to right lower extremity, left lower extremity is without edema  Laboratory: Recent Labs  Lab 07/21/21 0629  WBC 8.6  HGB 8.5*  HCT 26.7*  PLT 441*   Recent Labs  Lab 07/21/21 0629  NA 127*  K 4.8  CL 93*  CO2 29  BUN 14  CREATININE 0.92  CALCIUM 8.1*  GLUCOSE 87    Imaging/Diagnostic Tests: No results found.   [name], DO 07/28/2021, 5:57 AM PGY-2, [place] Intern pager: [phone], text pages welcome

## 2021-07-27 NOTE — Progress Notes (Signed)
Family Medicine Teaching Service Daily Progress Note Intern Pager: [phone]  Patient name: Arthur Mcmahon Medical record number: [id number] Date of birth: 30-Jan-1966 Age: 55 y.o. Gender: male  Primary Care Provider: Shary Key, DO Consultants: Vascular Surgery, Plastic Surgery  Code Status: Full  Pt Overview and Major Events to Date:  11/9 admitted after vascular surgery for right common femoral endarterectomy, lower extremity thrombectomy and fasciotomies with wound VAC placement  Assessment and Plan:  [name] is a 55 year old male who was admitted postoperatively by vascular surgery after operation on 11/9.  Currently postop day 10.  Past medical history significant for normocytic anemia, chronic hyponatremia, severe protein calorie malnutrition, tobacco use, PAD, metastatic bladder cancer   POD #14 necrosis of surgical incisions and lymphatic leakage s/p debridement and wound VAC placement  PAD  LE Edema Per [employer] rep in TOC note, home wound vac canisters can only hold 300 cc. Drain output 800 cc yesterday.  -MWF wound vac changes -BID fasciotomy changes -Tylenol 1000 mg q6hr -Gabapentin 400 mg TID -Oxycodone 5-10mg  q4hr prn  Metastatic bladder cancer  right hydroureteronephrosis  hematuria UOP yesterday 500 cc. Drain output yesterday 800 cc -Consider palliative care -Oncology appointment 08/05/21 -Urology appointment 08/18/21  Constipation Patient complaining of difficulty and pain having bowel movement -Fleets enima -Continue Miralax every other day 17 g -Senna daily prn -Colace 100 mg BID prn  FEN/GI: Regular Diet PPx: Eliquis BID Dispo:Home with home health  in 3 or more days. Barriers include wound vac drainage.   Subjective:  Patient uncomfortable 2/2 to constipation, requesting enima.   Objective: Temp:  [98 F (36.7 C)-98.8 F (37.1 C)] 98.8 F (37.1 C) (11/22 2322) Pulse Rate:  [77-93] 93 (11/22 2322) Resp:  [15-20] 20 (11/22 2322) BP:  (106-121)/(71-79) 111/74 (11/22 2322) SpO2:  [98 %-100 %] 98 % (11/22 2322) Physical Exam: General: Frail, white male, in visible discomfort Cardiovascular: RRR, NRMG Respiratory: CTABL Abdomen: Soft, NTTP, pain located in lower abdomen, non-distended Extremities: Right LE bandaged, with 3+ pitting edema to level of hip. Able to wiggle toes bilaterally. Right leg non-edematous.  Genital: Patient has edema in testicles  Laboratory: Recent Labs  Lab 07/20/21 0618 07/21/21 0629  WBC 9.1 8.6  HGB 8.4* 8.5*  HCT 26.5* 26.7*  PLT 429* 441*   Recent Labs  Lab 07/20/21 0618 07/21/21 0629  NA 127* 127*  K 4.5 4.8  CL 93* 93*  CO2 28 29  BUN 16 14  CREATININE 0.85 0.92  CALCIUM 8.1* 8.1*  GLUCOSE 97 87      Imaging/Diagnostic Tests:   Holley Bouche, MD 07/27/2021, 5:37 AM PGY-1, [place] Intern pager: [phone], text pages welcome

## 2021-07-27 NOTE — Progress Notes (Signed)
PT Cancellation Note  Patient Details Name: Arthur Mcmahon MRN: [id number] DOB: May 03, 1966   Cancelled Treatment:    Reason Eval/Treat Not Completed: Patient declined, no reason specified. Pt states it does not want to participate until he has a bowel movement despite encouragement. Will follow up later today as schedule permits.   [name], SPT  [name] [place] 07/27/2021, 8:09 AM

## 2021-07-27 NOTE — Progress Notes (Addendum)
[facility] [place]  Subjective  - Abdominal pain difficulty with BM.   Objective 123/82 (!) 46 98.6 F (37 C) (Oral) 20 99%  Intake/Output Summary (Last 24 hours) at 07/27/2021 0731 Last data filed at 07/27/2021 0600 Gross per 24 hour  Intake --  Output 1300 ml  Net -1300 ml    Right LE dressing clean and dry, edema in the foot chronic lymph edema Right groin wound vac to suction OP 800 total last 24 hours Lungs non labored breathing Heart RRR  Assessment/Planning:  55 y.o. male is s/p:  sharp excisional debridement of right groin and bilateral right lower leg fasciotomy incisions. Brisk multiphasic right PT Doppler signal. Wound VAC placed  14 Days Post-Op    Appreciate Dr. Erin Hearing input - he feels we could consider Sartorious flap, however, given possible lymphatic obstruction, it would have a high failure rate.   Drain OP lymph fluid increased compared to 2 previous days  Cont vac for now  CC: abdominal pain without BM will order fleets enema  RN for wound vac changes MWF, lower fasciotomy sites BID wet to dry with Dakins solution.    Arthur Mcmahon 07/27/2021 7:31 AM  VASCULAR STAFF ADDENDUM: I have independently interviewed and examined the patient. I agree with the above.  Recommend compression to the right leg via ACE to the thigh if possible as this can help slow lymph leak  Cassandria Santee, MD [facility] Phone Number: [phone] 07/27/2021 12:17 PM   --  Laboratory Lab Results: No results for input(s): WBC, HGB, HCT, PLT in the last 72 hours. BMET No results for input(s): NA, K, CL, CO2, GLUCOSE, BUN, CREATININE, CALCIUM in the last 72 hours.  COAG Lab Results  Component Value Date   INR 1.0 06/18/2021   INR 1.1 04/11/2021   INR 0.9 03/22/2021   No results found for: PTT

## 2021-07-27 NOTE — Progress Notes (Signed)
Occupational Therapy Treatment Patient Details Name: Arthur Mcmahon MRN: [id number] DOB: 05/18/1966 Today's Date: 07/27/2021   History of present illness Pt is a 55 y.o. male admitted on 07/13/21 for necrosis and lymphatic leakage of RLE.  Debridement of fasciotomy incisions and wound VAC placement of groin incision on 11/09.  PMH includes HTN, PVD, anemia, s/p right thrombectomy and 4 compartment fasciotomies on 06/19/21.   OT comments  Patient received in bed and states he continues to have bowel issues but is willing to participate. Patient was able to get to EOB but required increased time due to fatigue. Patient donned socks with sock aide and extra time and verbal cues. Patient ambulated short distance to [facility]. Patient required increased time and was able to have a BM and returned to toilet shortly after 1st BM to have another. Patient required assistance with toilet hygiene for thoroughness.  Patient stated he felt relief following. Patient returned to supine with extra time to perform.  Acute OT to continue to follow.    Recommendations for follow up therapy are one component of a multi-disciplinary discharge planning process, led by the attending physician.  Recommendations may be updated based on patient status, additional functional criteria and insurance authorization.    Follow Up Recommendations  Home health OT    Assistance Recommended at Discharge Intermittent Supervision/Assistance  Equipment Recommendations  None recommended by OT    Recommendations for Other Services      Precautions / Restrictions Precautions Precautions: Fall Precaution Comments: wound vac right groin area Restrictions Weight Bearing Restrictions: Yes RLE Weight Bearing: Weight bearing as tolerated       Mobility Bed Mobility Overal bed mobility: Modified Independent             General bed mobility comments: increased time to get to eob and back to supine    Transfers Overall transfer  level: Needs assistance Equipment used: Rolling walker (2 wheels) Transfers: Sit to/from Stand Sit to Stand: Min assist   Step pivot transfers: Min assist       General transfer comment: min assist on this date due to appeared weaker and required assistance with equipment     Balance Overall balance assessment: Needs assistance Sitting-balance support: No upper extremity supported;Feet supported Sitting balance-Leahy Scale: Good Sitting balance - Comments: able to sit EOB with no UE support   Standing balance support: Bilateral upper extremity supported;Single extremity supported Standing balance-Leahy Scale: Poor Standing balance comment: able to assist with toilet hygiene while standing.                           ADL either performed or assessed with clinical judgement   ADL Overall ADL's : Needs assistance/impaired                     Lower Body Dressing: Cueing for sequencing;Supervision/safety Lower Body Dressing Details (indicate cue type and reason): required directions to remember how do use sock aide Toilet Transfer: Minimal assistance;BSC/3in1;Rolling walker (2 wheels) Toilet Transfer Details (indicate cue type and reason): ambulated to [facility] from EOB and required min assist due to equipment Toileting- Clothing Manipulation and Hygiene: Minimal assistance Toileting - Clothing Manipulation Details (indicate cue type and reason): performed toilet hygiene while standing and required assistance to complete for throughness     Functional mobility during ADLs: Minimal assistance;Rolling walker (2 wheels) General ADL Comments: patient appeared weak on this date, states he has not been sleeping well  Extremity/Trunk Assessment              Vision       Perception     Praxis      Cognition Arousal/Alertness: Awake/alert Behavior During Therapy: WFL for tasks assessed/performed Overall Cognitive Status: Within Functional Limits for tasks  assessed                                 General Comments: preoccupied with not having had a BM          Exercises     Shoulder Instructions       General Comments      Pertinent Vitals/ Pain       Pain Assessment: Faces Faces Pain Scale: Hurts even more Pain Location: RLE, groin, and abdominal Pain Descriptors / Indicators: Aching;Discomfort;Grimacing;Pressure Pain Intervention(s): Monitored during session;Repositioned  Home Living                                          Prior Functioning/Environment              Frequency  Min 2X/week        Progress Toward Goals  OT Goals(current goals can now be found in the care plan section)  Progress towards OT goals: Progressing toward goals  Acute Rehab OT Goals Patient Stated Goal: none stated OT Goal Formulation: With patient Time For Goal Achievement: 07/29/21 Potential to Achieve Goals: Good ADL Goals Pt Will Perform Grooming: with modified independence;sitting;standing Pt Will Perform Lower Body Bathing: with modified independence;sit to/from stand Pt Will Perform Lower Body Dressing: with modified independence;with adaptive equipment;sit to/from stand Pt Will Transfer to Toilet: ambulating;regular height toilet;with set-up Pt Will Perform Toileting - Clothing Manipulation and hygiene: with modified independence;sit to/from stand Pt Will Perform Tub/Shower Transfer: with modified independence;ambulating;shower seat  Plan Discharge plan remains appropriate    Co-evaluation                 AM-PAC OT "6 Clicks" Daily Activity     Outcome Measure   Help from another person eating meals?: None Help from another person taking care of personal grooming?: None Help from another person toileting, which includes using toliet, bedpan, or urinal?: A Little Help from another person bathing (including washing, rinsing, drying)?: A Lot Help from another person to put on and  taking off regular upper body clothing?: None Help from another person to put on and taking off regular lower body clothing?: A Little 6 Click Score: 20    End of Session Equipment Utilized During Treatment: Rolling walker (2 wheels)  OT Visit Diagnosis: Unsteadiness on feet (R26.81);Other abnormalities of gait and mobility (R26.89);Pain Pain - Right/Left: Right Pain - part of body: Leg   Activity Tolerance Patient limited by fatigue;Patient limited by pain   Patient Left in bed;with call bell/phone within reach   Nurse Communication Mobility status;Other (comment) (informed nursing that patient had a BM)        Time: 6195-0932 OT Time Calculation (min): 39 min  Charges: OT General Charges $OT Visit: 1 Visit OT Treatments $Self Care/Home Management : 38-52 mins  Lodema Hong, [place]  Pager [phone] Office [place] 07/27/2021, 2:56 PM

## 2021-07-28 MED ORDER — DAKINS (1/4 STRENGTH) 0.125 % EX SOLN
Freq: Two times a day (BID) | CUTANEOUS | Status: AC
  Filled 2021-07-28: qty 473

## 2021-07-28 NOTE — Progress Notes (Signed)
[facility] [place]  Subjective  -tired this morning.  Slept poorly   Objective 121/76 89 98.9 F (37.2 C) (Oral) 18 98%  Intake/Output Summary (Last 24 hours) at 07/28/2021 0936 Last data filed at 07/28/2021 0800 Gross per 24 hour  Intake 240 ml  Output 1450 ml  Net -1210 ml     Right LE dressing clean and dry, edema in the foot chronic lymph edema Right groin wound vac to suction OP 400 total last 24 hours Lungs non labored breathing Heart RRR  Assessment/Planning:  55 y.o. male is s/p:  sharp excisional debridement of right groin and bilateral right lower leg fasciotomy incisions. Brisk multiphasic right PT Doppler signal. 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  Pulse exam unchanged, with multiphasic posterior tibial artery signal, peroneal signal. Continue conservative care. Continues on Eliquis Patient would benefit from compression dressing if tolerated to relieve edema in the right lower extremity.  Compression can also decrease lymph output in the coming days. Unfortunately, he has a difficult diagnosis.  I do not have a good way of managing this lymphatic leak as the area has already been explored with suture ligation of multiple sites.  We will continue with conservative measures through the weekend to see if output decreases. Should high output continue, will discuss other therapeutic options Monday.  Should output slow, patient can be discharged home with follow-up.  RN for wound vac changes MWF, lower fasciotomy sites BID wet to dry with Dakins solution.    Arthur Mcmahon 07/28/2021 9:36 AM   --  Laboratory Lab Results: No results for input(s): WBC, HGB, HCT, PLT in the last 72 hours. BMET No results for input(s): NA, K, CL, CO2, GLUCOSE, BUN, CREATININE, CALCIUM in the last 72 hours.  COAG Lab Results  Component Value Date   INR 1.0 06/18/2021   INR  1.1 04/11/2021   INR 0.9 03/22/2021   No results found for: PTT

## 2021-07-29 DIAGNOSIS — I890 Lymphedema, not elsewhere classified: Secondary | ICD-10-CM

## 2021-07-29 MED ORDER — POLYETHYLENE GLYCOL 3350 17 G PO PACK
34.0000 g | PACK | Freq: Two times a day (BID) | ORAL | Status: DC
  Administered 2021-07-30 – 2021-08-01 (×5): 34 g via ORAL
  Filled 2021-07-29 (×7): qty 2

## 2021-07-29 NOTE — Progress Notes (Signed)
OT Cancellation Note  Patient Details Name: Arthur Mcmahon MRN: [id number] DOB: March 10, 1966   Cancelled Treatment:    Reason Eval/Treat Not Completed: Fatigue/lethargy limiting ability to participate.  Pt declined despite encouragement.  Will reattempt.  Nilsa Nutting., OTR/L Acute Rehabilitation Services Pager [phone] Office [phone]   Lucille Passy M 07/29/2021, 2:58 PM

## 2021-07-29 NOTE — Progress Notes (Signed)
PT Cancellation Note  Patient Details Name: Arthur Mcmahon MRN: [id number] DOB: 09-19-1965   Cancelled Treatment:    Reason Eval/Treat Not Completed: Patient declined, no reason specified Pt continues to decline therapy due to "not feeling well, can't eat, constipated."  Educated on importance of activity to maintain strength, prevent PNE, prevent blood clots, and even improve bowel function.  Pt states "I know, but check back later."  Discussed he has not been up with therapy since Monday , saying check back later for 3rd day now.  Pt still politely declines therapy.  Will f/u as able. Abran Richard, PT Acute Rehab Services Pager [phone] [facility] [place] 07/29/2021, 11:10 AM

## 2021-07-29 NOTE — Progress Notes (Signed)
Physical Therapy Treatment Patient Details Name: Arthur Mcmahon MRN: [id number] DOB: 05-28-1966 Today's Date: 07/29/2021   History of Present Illness Pt is a 55 y.o. male admitted on 07/13/21 for necrosis and lymphatic leakage of RLE.  Debridement of fasciotomy incisions and wound VAC placement of groin incision on 11/09.  PMH includes HTN, PVD, anemia, s/p right thrombectomy and 4 compartment fasciotomies on 06/19/21.    PT Comments    Pt has made little to no progress with therapy due to pain and self limiting (several refusals PT, OT, mobility specialist over the past week).  He does have significant edema in L pelvis, back, and LE with pain.  He agreed to work on LE exercises but declined standing/walking today.  Exercises focused on LE strengthening, stretching, and neutral positioning. Pt was educated on importance of mobility and acknowledged.  Continue POC as able, pt expected to progress as pain improves.     Recommendations for follow up therapy are one component of a multi-disciplinary discharge planning process, led by the attending physician.  Recommendations may be updated based on patient status, additional functional criteria and insurance authorization.  Follow Up Recommendations  Home health PT     Assistance Recommended at Discharge [facility] bed    Recommendations for Other Services       Precautions / Restrictions Precautions Precautions: Fall Precaution Comments: wound vac right groin area     Mobility  Bed Mobility Overal bed mobility: Needs Assistance Bed Mobility: Supine to Sit;Sit to Supine     Supine to sit: HOB elevated;Supervision Sit to supine: Min guard   General bed mobility comments: Increased time with heavy use of rail to sit; min guard for R LE back to bed    Transfers Overall transfer level: Needs assistance   Transfers: Bed to chair/wheelchair/BSC             Lateral/Scoot Transfers: Min guard General transfer comment: declined standing due to not feeling well, did laterally scoot toward [facility]    Ambulation/Gait                   Stairs             Wheelchair Mobility    Modified Rankin (Stroke Patients Only)       Balance Overall balance assessment: Needs assistance Sitting-balance support: No upper extremity supported;Feet supported Sitting balance-Leahy Scale: Good Sitting balance - Comments: preferred UE support for pain control       Standing balance comment: declined                            Cognition Arousal/Alertness: Awake/alert Behavior During Therapy: Flat affect Overall Cognitive Status: Within Functional Limits for tasks assessed                                 General Comments: frustrated with not getting better and pain        Exercises General Exercises - Lower Extremity Ankle Circles/Pumps: AROM;Left;AAROM;Right;10 reps;Seated Quad Sets: AROM;Both;5 reps;Supine Long Arc Quad: AROM;Both;10 reps;Supine Heel Slides: AAROM;Right;10 reps;Supine Other Exercises Other Exercises: R hip ER to neutral with 10 sec hold AAROM/stretch x 5 Other Exercises: Cues for full ROM with exercises as able    General Comments        Pertinent Vitals/Pain Pain Assessment: 0-10 Pain Score: 7  Pain Location:  RLE, groin, and abdominal Pain Descriptors / Indicators: Aching;Discomfort;Grimacing;Pressure Pain Intervention(s): Limited activity within patient's tolerance;Monitored during session;Repositioned;Patient requesting pain meds-RN notified    Home Living                          Prior Function            PT Goals (current goals can now be found in the care plan section) Acute Rehab PT Goals Time For Goal Achievement: 08/12/21 Progress towards PT goals: Not progressing toward goals - comment (pain/self limiting; lots of refusals to PT, OT, and mobility  specialist)    Frequency    Min 3X/week      PT Plan Current plan remains appropriate    Co-evaluation              AM-PAC PT "6 Clicks" Mobility   Outcome Measure  Help needed turning from your back to your side while in a flat bed without using bedrails?: A Little Help needed moving from lying on your back to sitting on the side of a flat bed without using bedrails?: A Little Help needed moving to and from a bed to a chair (including a wheelchair)?: A Little Help needed standing up from a chair using your arms (e.g., wheelchair or bedside chair)?: A Little Help needed to walk in hospital room?: A Little Help needed climbing 3-5 steps with a railing? : A Lot 6 Click Score: 17    End of Session   Activity Tolerance: Other (comment) (Pain and self limiting) Patient left: in bed;with call bell/phone within reach;with bed alarm set Nurse Communication: Mobility status PT Visit Diagnosis: Other abnormalities of gait and mobility (R26.89);Muscle weakness (generalized) (M62.81);Unsteadiness on feet (R26.81);Difficulty in walking, not elsewhere classified (R26.2);Pain Pain - Right/Left: Right Pain - part of body: Leg     Time: 3361-2244 PT Time Calculation (min) (ACUTE ONLY): 15 min  Charges:  $Therapeutic Exercise: 8-22 mins                     Abran Richard, PT Acute Rehab Services Pager [phone] [facility] [phone]    Karlton Lemon 07/29/2021, 1:56 PM

## 2021-07-29 NOTE — Progress Notes (Signed)
Family Medicine Teaching Service Daily Progress Note Intern Pager: [phone]  Patient name: Arthur Mcmahon Medical record number: [id number] Date of birth: Jan 24, 1966 Age: 55 y.o. Gender: male  Primary Care Provider: Shary Key, DO Consultants: Vascular surgery, plastic surgery Code Status: Full code  Pt Overview and Major Events to Date:  11/9: admitted after vascular surgery for right common femoral endarterectomy, lower extremity thrombectomy and fasciotomies with wound VAC placement due to ongoing lymphatic leak   Assessment and Plan: [name] is a 55 y.o. male he was admitted postoperatively after right common femoral endarterectomy, lower extremity thrombectomy and fasciotomies, now with a wound VAC placement.  He continues to have significant drainage from his wound VAC which is a barrier to discharge.  His past medical history is significant for normocytic anemia, chronic hyponatremia, severe protein calorie malnutrition, tobacco use, PAD, metastatic bladder cancer.  S/p debridement of necrosis of surgical incisions Lymphatic leakage  wound VAC in place Drain output continues to be about the same, 450 cc.  Vascular surgery recommends compression dressing to help with the edema.  If he continues to have significant drain output, they will reexplore options on Monday.  If drainage decreases, he will be stable for discharge. -Vascular following, appreciate recommendations -Thigh-high compression stocking ordered to right lower extremity -Continue to follow-up drain output -MWF wound VAC changes -Continue current pain regimen with Tylenol, gabapentin, oxycodone  Constipation: Ongoing Continues to report some constipation, feels that this is causing him to have a decreased appetite. -Increase MiraLAX to 34 g BID  -Continue senna daily  -Continue colace BID PRN -Can decrease regimen once improving   Metastatic urothelial carcinoma of the bladder Thought that his  continued lymph drainage is secondary to his bladder cancer.  He has an oncology follow-up next Friday (12/2) and urology follow-up on 12/15.  Peripheral artery disease -Continue atorvastatin 40 mg daily  Other problems chronic and stable. -Protein calorie malnutrition -Tobacco use disorder  FEN/GI: Regular diet PPx: Eliquis twice daily Dispo: Home health, barriers include significant wound VAC drainage    Subjective:  [name] feels that he is doing about the same as yesterday.  He states that he has not been eating much and attributed to his constipation.  He only had a small bowel movement yesterday and it was hard.  Feels that he might still be a little bit backed up.  He would like some assistance and is amenable to increasing MiraLAX.  Objective: Temp:  [97.8 F (36.6 C)-98.9 F (37.2 C)] 97.8 F (36.6 C) (11/24 2351) Pulse Rate:  [73-89] 73 (11/24 2351) Resp:  [16-18] 18 (11/24 2351) BP: (114-132)/(73-82) 127/82 (11/24 2351) SpO2:  [93 %-100 %] 93 % (11/24 2351) Physical Exam: General: Chronically ill-appearing, cachectic white male, NAD, cooperative with examination Cardiovascular: RRR Respiratory: CTA B Abdomen: Soft, nondistended, nontender in all quadrants Extremities: RLE bandaged and with 3-4+ pitting edema up to thigh.  LLE without edema  Laboratory: No results for input(s): WBC, HGB, HCT, PLT in the last 168 hours. No results for input(s): NA, K, CL, CO2, BUN, CREATININE, CALCIUM, PROT, BILITOT, ALKPHOS, ALT, AST, GLUCOSE in the last 168 hours.  Invalid input(s): LABALBU  Imaging/Diagnostic Tests: No results found.   [name], DO 07/29/2021, 2:24 AM PGY-2, [place] Intern pager: [phone], text pages welcome

## 2021-07-29 NOTE — Progress Notes (Deleted)
  Progress Note    07/29/2021 8:16 AM 16 Days Post-Op  Subjective:  says he hasn't felt great the past couple of days b/c of constipation.  afebrile  Vitals:   07/28/21 2351 07/29/21 0300  BP: 127/82 (!) 120/93  Pulse: 73   Resp: 18 20  Temp: 97.8 F (36.6 C) 97.7 F (36.5 C)  SpO2: 93% 99%    Physical Exam: Cardiac:  regular Lungs:  non labored Incisions:  right groin with vac with good seal; lower extremity wounds healing     Extremities:  brisk doppler signals bilateral PT; toes right foot appear unchanged.    CBC    Component Value Date/Time   WBC 8.6 07/21/2021 0629   RBC 2.88 (L) 07/21/2021 0629   HGB 8.5 (L) 07/21/2021 0629   HCT 26.7 (L) 07/21/2021 0629   PLT 441 (H) 07/21/2021 0629   MCV 92.7 07/21/2021 0629   MCH 29.5 07/21/2021 0629   MCHC 31.8 07/21/2021 0629   RDW 14.6 07/21/2021 0629   LYMPHSABS 0.7 07/21/2021 0629   MONOABS 0.8 07/21/2021 0629   EOSABS 0.2 07/21/2021 0629   BASOSABS 0.0 07/21/2021 0629    BMET    Component Value Date/Time   NA 127 (L) 07/21/2021 0629   K 4.8 07/21/2021 0629   CL 93 (L) 07/21/2021 0629   CO2 29 07/21/2021 0629   GLUCOSE 87 07/21/2021 0629   BUN 14 07/21/2021 0629   CREATININE 0.92 07/21/2021 0629   CALCIUM 8.1 (L) 07/21/2021 0629   GFRNONAA >60 07/21/2021 0629   GFRAA >60 12/01/2019 2208    INR    Component Value Date/Time   INR 1.0 06/18/2021 1550     Intake/Output Summary (Last 24 hours) at 07/29/2021 0816 Last data filed at 07/29/2021 0700 Gross per 24 hour  Intake 240 ml  Output 800 ml  Net -560 ml      Assessment/Plan:  55 y.o. male is s/p:  sharp excisional debridement of right groin and bilateral right lower leg fasciotomy incisions. Brisk multiphasic right PT Doppler signal. Wound VAC placed  16 Days Post-Op   -continue dakins solution for fasciotomy sites-good granulation tissue present.   -pt continues to have increased lymphatic drainage from right groin with 850cc/24hr  yesterday. -pt has brisk PT doppler signals bilaterally -oob tid at meal times   -DVT prophylaxis:  Eliquis   Leontine Locket, PA-C [facility] [phone] 07/29/2021 8:16 AM   VASCULAR STAFF ADDENDUM: I have independently interviewed and examined the patient. I agree with the above.    Cassandria Santee, MD [facility] Phone Number: [phone] 07/29/2021 8:17 AM

## 2021-07-29 NOTE — Progress Notes (Addendum)
  Progress Note    07/29/2021 7:12 AM 16 Days Post-Op  Subjective:  says he hasn't felt great the past couple of days b/c of constipation.  afebrile  Vitals:   07/28/21 2351 07/29/21 0300  BP: 127/82 (!) 120/93  Pulse: 73   Resp: 18 20  Temp: 97.8 F (36.6 C) 97.7 F (36.5 C)  SpO2: 93% 99%    Physical Exam: Cardiac:  regular Lungs:  non labored Incisions:  right groin with vac with good seal; lower extremity wounds healing     Extremities:  brisk doppler signals bilateral PT; toes right foot appear unchanged.    CBC    Component Value Date/Time   WBC 8.6 07/21/2021 0629   RBC 2.88 (L) 07/21/2021 0629   HGB 8.5 (L) 07/21/2021 0629   HCT 26.7 (L) 07/21/2021 0629   PLT 441 (H) 07/21/2021 0629   MCV 92.7 07/21/2021 0629   MCH 29.5 07/21/2021 0629   MCHC 31.8 07/21/2021 0629   RDW 14.6 07/21/2021 0629   LYMPHSABS 0.7 07/21/2021 0629   MONOABS 0.8 07/21/2021 0629   EOSABS 0.2 07/21/2021 0629   BASOSABS 0.0 07/21/2021 0629    BMET    Component Value Date/Time   NA 127 (L) 07/21/2021 0629   K 4.8 07/21/2021 0629   CL 93 (L) 07/21/2021 0629   CO2 29 07/21/2021 0629   GLUCOSE 87 07/21/2021 0629   BUN 14 07/21/2021 0629   CREATININE 0.92 07/21/2021 0629   CALCIUM 8.1 (L) 07/21/2021 0629   GFRNONAA >60 07/21/2021 0629   GFRAA >60 12/01/2019 2208    INR    Component Value Date/Time   INR 1.0 06/18/2021 1550     Intake/Output Summary (Last 24 hours) at 07/29/2021 7893 Last data filed at 07/29/2021 0700 Gross per 24 hour  Intake 480 ml  Output 1000 ml  Net -520 ml     Assessment/Plan:  55 y.o. male is s/p:  sharp excisional debridement of right groin and bilateral right lower leg fasciotomy incisions. Brisk multiphasic right PT Doppler signal. Wound VAC placed  16 Days Post-Op   -continue dakins solution for fasciotomy sites-good granulation tissue present.   -pt continues to have increased lymphatic drainage from right groin with 850cc/24hr  yesterday. -pt has brisk PT doppler signals bilaterally -oob tid at meal times   -DVT prophylaxis:  Eliquis   Leontine Locket, PA-C [facility] [phone] 07/29/2021 7:12 AM  Patient with small vessel disease in the foot with some areas of mottling.  Pulse exam unchanged, with multiphasic posterior tibial artery signal, peroneal signal. Continue conservative care. Continues on Eliquis Patient would benefit from compression dressing if tolerated to relieve edema in the right lower extremity.  Compression can also decrease lymph output in the coming days. Unfortunately, he has a difficult diagnosis.  I do not have a good way of managing this lymphatic leak as the area has already been explored with suture ligation of multiple sites.  We will continue with conservative measures through the weekend to see if output decreases. Should high output continue, will discuss other therapeutic options Monday.  Should output slow, patient can be discharged home with follow-up.   RN for wound vac changes MWF, lower fasciotomy sites BID wet to dry with Dakins solution.  Will see Monday   Broadus John MD

## 2021-07-30 DIAGNOSIS — R590 Localized enlarged lymph nodes: Secondary | ICD-10-CM | POA: Diagnosis present

## 2021-07-30 DIAGNOSIS — T8131XA Disruption of external operation (surgical) wound, not elsewhere classified, initial encounter: Secondary | ICD-10-CM | POA: Diagnosis present

## 2021-07-30 DIAGNOSIS — C678 Malignant neoplasm of overlapping sites of bladder: Secondary | ICD-10-CM

## 2021-07-30 LAB — BASIC METABOLIC PANEL
Anion gap: 9 (ref 5–15)
Anion gap: 10 (ref 5–15)
BUN: 41 mg/dL — ABNORMAL HIGH (ref 6–20)
BUN: 48 mg/dL — ABNORMAL HIGH (ref 6–20)
CO2: 25 mmol/L (ref 22–32)
CO2: 23 mmol/L (ref 22–32)
Calcium: 9.4 mg/dL (ref 8.9–10.3)
Calcium: 9.2 mg/dL (ref 8.9–10.3)
Chloride: 91 mmol/L — ABNORMAL LOW (ref 98–111)
Chloride: 90 mmol/L — ABNORMAL LOW (ref 98–111)
Creatinine, Ser: 1.72 mg/dL — ABNORMAL HIGH (ref 0.61–1.24)
Creatinine, Ser: 1.85 mg/dL — ABNORMAL HIGH (ref 0.61–1.24)
GFR, Estimated: 47 mL/min — ABNORMAL LOW (ref >60)
GFR, Estimated: 43 mL/min — ABNORMAL LOW (ref >60)
Glucose, Bld: 86 mg/dL (ref 70–99)
Glucose, Bld: 88 mg/dL (ref 70–99)
Potassium: 5.8 mmol/L — ABNORMAL HIGH (ref 3.5–5.1)
Potassium: 5.9 mmol/L — ABNORMAL HIGH (ref 3.5–5.1)
Sodium: 125 mmol/L — ABNORMAL LOW (ref 135–145)
Sodium: 123 mmol/L — ABNORMAL LOW (ref 135–145)

## 2021-07-30 MED ORDER — SODIUM ZIRCONIUM CYCLOSILICATE 10 G PO PACK
10.0000 g | PACK | Freq: Three times a day (TID) | ORAL | Status: AC
  Administered 2021-07-31 (×2): 10 g via ORAL
  Filled 2021-07-30 (×2): qty 1

## 2021-07-30 NOTE — Progress Notes (Addendum)
Progress note, Edema  Received page from charge nurse Angie, about patient with developing pitting edema. Angie had reached out to vascular surgery, who seemed to believe their hands were tied in this matter, and palliative should be consulted. Angie reached out to primary team about getting palliative involved. Went to assess patient with Angie and appreciated that patient had 3+ pitting edema at level of ribs, down to foot, with extension into testicles and abdomen. Patient denies any change in respiratory status/comfort of breathing. Patient is open to talking with Palliative Care to help plan next step in medical course.   Spoke with attending Dr. McDiarmid who recommended:  -sodium restriction 2-2.5 g,   -fluid restriction to 1200 cc,   -head of bed elevated to 30 degrees   -reach out to vascular about use of diuretics and compression wraps, to control fluid status.   Holley Bouche, MD [place] PGY-1 .

## 2021-07-30 NOTE — Progress Notes (Signed)
Progress Note  Spoke with Dr. Orlie Pollen about patient's wound vac drainage. Patient wound vac drainage has decreased, 2/2 to healing, and now fluid is accumulating in abdomen, testicles, and right leg. Dr. Virl Cagey notes that this situation is very unfortunate and that we don't have many options. It is likely the fluid will continue accumulate in Arthur Mcmahon as wound vac wound heals and has no where to drain. Dr. Virl Cagey noted that he would discuss this with Arthur Mcmahon tomorrow. Dr. Virl Cagey recommend a compression dressing, and for the patient to mobilize to help with leg edema.  [place] PGY-1

## 2021-07-30 NOTE — Progress Notes (Addendum)
Paged MD Virl Cagey regarding decrease in VAC drainage and significant increase in pitting edema in the right abd and groin area. No new orders.   Raelyn Number, RN

## 2021-07-30 NOTE — Progress Notes (Signed)
FPTS Brief Note Reviewed patient's vitals, recent notes.  Vitals:   07/29/21 2024 07/29/21 2333  BP: 123/78 119/76  Pulse: 78 81  Resp: 17 17  Temp: 98.6 F (37 C) 98.4 F (36.9 C)  SpO2: 97% 99%   At this time, no change in plan from day progress note.  [name], DO Page [phone] with questions about this patient.

## 2021-07-30 NOTE — Progress Notes (Addendum)
Family Medicine Teaching Service Daily Progress Note Intern Pager: [phone]  Patient name: Arthur Mcmahon Medical record number: [id number] Date of birth: [birth year] Age: 55 y.o. Gender: male  Primary Care Provider: Shary Key, DO Consultants: Vascular Code Status: Full  Pt Overview and Major Events to Date:  11/9: admitted after vascular surgery for right common femoral endarterectomy, lower extremity thrombectomy and fasciotomies with wound VAC placement due to ongoing lymphatic leak   Assessment and Plan:  Arthur Mcmahon is a 55 y.o. male he was admitted postoperatively after right common femoral endarterectomy, lower extremity thrombectomy and fasciotomies, now with a wound VAC placement. He continues to have significant drainage from his wound VAC which is a barrier to discharge.  His past medical history is significant for normocytic anemia, chronic hyponatremia, severe protein calorie malnutrition, tobacco use, PAD, metastatic bladder cancer.   S/p debridement of necrosis of surgical incisions Lymphatic leakage  wound VAC in place Drain output 300 cc this morning. Vascular surgery to re-evaluate drain output on Monday, will speak to patient on Sunday about wound drainage. Patient with 3+ pitting edema extending up side to level of ribs, and down leg to level of toes, with some abdominal and scrotal involvement. -Head of bed at 30 degrees -Na restriction to 2.0-2.5 grams per day -Fluid restriction to 1200 cc per day -Vascular following, appreciate recommendations -Thigh high compression stockings ordered to right lower extremity -Continue to monitor drain output -MWF wound vac changes/daily fasciotomy -Pain regimen: Tylenol, gabapentin, oxycodone   Constipation: Ongoing Last recorded stool 07/28/21 -Miralax 34 g BID -Senna daily -Colace BID prn   Metastatic urothelial carcinoma of the bladder Thought that lymph drainage in wound vac, is secondary to his bladder  cancer. -Oncology appointment 08/05/21 -Urology appointment 08/18/21  Peripheral artery disease -Continue atorvastatin 40 mg daily  Other problems chronic and stable. -Protein calorie malnutrition -Tobacco use disorder  FEN/GI: Regular diet PPx: Eliquis BID Dispo:Home with home health   Barriers include wound vac drainage.   Subjective:  Patient complaining of twitching in right hand this morning. No other concerns or symptoms, hand otherwise normal.   Objective: Temp:  [98 F (36.7 C)-98.6 F (37 C)] 98.2 F (36.8 C) (11/26 0442) Pulse Rate:  [73-93] 93 (11/26 0442) Resp:  [17-18] 18 (11/26 0442) BP: (119-136)/(76-85) 123/85 (11/26 0442) SpO2:  [97 %-100 %] 98 % (11/26 0442) Physical Exam: General: Frail, white, male, NASD Cardiovascular: RRR, NRMG Respiratory: CTABL Abdomen: soft, NTTP Extremities: 3+ Pitting edema in right leg to level of ribs, no edema in left leg, some edema in scrotum and abdomen.   Laboratory: No results for input(s): WBC, HGB, HCT, PLT in the last 168 hours. No results for input(s): NA, K, CL, CO2, BUN, CREATININE, CALCIUM, PROT, BILITOT, ALKPHOS, ALT, AST, GLUCOSE in the last 168 hours.  Invalid input(s): LABALBU    Imaging/Diagnostic Tests:   Holley Bouche, MD 07/30/2021, 6:00 AM PGY-1, [place] Intern pager: [phone], text pages welcome

## 2021-07-31 ENCOUNTER — Inpatient Hospital Stay (HOSPITAL_COMMUNITY): Payer: Medicaid Other

## 2021-07-31 DIAGNOSIS — E871 Hypo-osmolality and hyponatremia: Secondary | ICD-10-CM

## 2021-07-31 DIAGNOSIS — N179 Acute kidney failure, unspecified: Secondary | ICD-10-CM

## 2021-07-31 DIAGNOSIS — C7910 Secondary malignant neoplasm of unspecified urinary organs: Secondary | ICD-10-CM | POA: Diagnosis present

## 2021-07-31 DIAGNOSIS — I871 Compression of vein: Secondary | ICD-10-CM | POA: Diagnosis present

## 2021-07-31 DIAGNOSIS — L89152 Pressure ulcer of sacral region, stage 2: Secondary | ICD-10-CM | POA: Diagnosis present

## 2021-07-31 LAB — BASIC METABOLIC PANEL
Anion gap: 11 (ref 5–15)
BUN: 59 mg/dL — ABNORMAL HIGH (ref 6–20)
CO2: 23 mmol/L (ref 22–32)
Calcium: 9.4 mg/dL (ref 8.9–10.3)
Chloride: 90 mmol/L — ABNORMAL LOW (ref 98–111)
Creatinine, Ser: 2.18 mg/dL — ABNORMAL HIGH (ref 0.61–1.24)
GFR, Estimated: 35 mL/min — ABNORMAL LOW (ref >60)
Glucose, Bld: 80 mg/dL (ref 70–99)
Potassium: 6 mmol/L — ABNORMAL HIGH (ref 3.5–5.1)
Sodium: 124 mmol/L — ABNORMAL LOW (ref 135–145)

## 2021-07-31 LAB — CBC
HCT: 28.7 % — ABNORMAL LOW (ref 39.0–52.0)
Hemoglobin: 9.2 g/dL — ABNORMAL LOW (ref 13.0–17.0)
MCH: 28.2 pg (ref 26.0–34.0)
MCHC: 32.1 g/dL (ref 30.0–36.0)
MCV: 88 fL (ref 80.0–100.0)
Platelets: 534 10*3/uL — ABNORMAL HIGH (ref 150–400)
RBC: 3.26 MIL/uL — ABNORMAL LOW (ref 4.22–5.81)
RDW: 15.2 % (ref 11.5–15.5)
WBC: 18.7 10*3/uL — ABNORMAL HIGH (ref 4.0–10.5)
nRBC: 0 % (ref 0.0–0.2)

## 2021-07-31 LAB — HEPATIC FUNCTION PANEL
ALT: 9 U/L (ref 0–44)
ALT: 9 U/L (ref 0–44)
AST: 26 U/L (ref 15–41)
AST: 19 U/L (ref 15–41)
Albumin: 1.8 g/dL — ABNORMAL LOW (ref 3.5–5.0)
Albumin: 1.6 g/dL — ABNORMAL LOW (ref 3.5–5.0)
Alkaline Phosphatase: 91 U/L (ref 38–126)
Alkaline Phosphatase: 82 U/L (ref 38–126)
Bilirubin, Direct: 0.2 mg/dL (ref 0.0–0.2)
Bilirubin, Direct: 0.1 mg/dL (ref 0.0–0.2)
Indirect Bilirubin: 0.4 mg/dL (ref 0.3–0.9)
Indirect Bilirubin: 0.3 mg/dL (ref 0.3–0.9)
Total Bilirubin: 0.6 mg/dL (ref 0.3–1.2)
Total Bilirubin: 0.4 mg/dL (ref 0.3–1.2)
Total Protein: 6.4 g/dL — ABNORMAL LOW (ref 6.5–8.1)
Total Protein: 6.1 g/dL — ABNORMAL LOW (ref 6.5–8.1)

## 2021-07-31 LAB — URINALYSIS, ROUTINE W REFLEX MICROSCOPIC
Bilirubin Urine: NEGATIVE
Glucose, UA: NEGATIVE mg/dL
Ketones, ur: NEGATIVE mg/dL
Nitrite: NEGATIVE
Protein, ur: 30 mg/dL — AB
RBC / HPF: >50 RBC/hpf — ABNORMAL HIGH (ref 0–5)
Specific Gravity, Urine: 1.015 (ref 1.005–1.030)
WBC, UA: >50 WBC/hpf — ABNORMAL HIGH (ref 0–5)
pH: 5 (ref 5.0–8.0)

## 2021-07-31 LAB — COMPREHENSIVE METABOLIC PANEL
ALT: 8 U/L (ref 0–44)
AST: 18 U/L (ref 15–41)
Albumin: 1.7 g/dL — ABNORMAL LOW (ref 3.5–5.0)
Alkaline Phosphatase: 84 U/L (ref 38–126)
Anion gap: 9 (ref 5–15)
BUN: 49 mg/dL — ABNORMAL HIGH (ref 6–20)
CO2: 25 mmol/L (ref 22–32)
Calcium: 9.3 mg/dL (ref 8.9–10.3)
Chloride: 89 mmol/L — ABNORMAL LOW (ref 98–111)
Creatinine, Ser: 2.2 mg/dL — ABNORMAL HIGH (ref 0.61–1.24)
GFR, Estimated: 35 mL/min — ABNORMAL LOW (ref >60)
Glucose, Bld: 83 mg/dL (ref 70–99)
Potassium: 5.9 mmol/L — ABNORMAL HIGH (ref 3.5–5.1)
Sodium: 123 mmol/L — ABNORMAL LOW (ref 135–145)
Total Bilirubin: 1 mg/dL (ref 0.3–1.2)
Total Protein: 5.9 g/dL — ABNORMAL LOW (ref 6.5–8.1)

## 2021-07-31 LAB — RETICULOCYTES
Immature Retic Fract: 28.8 % — ABNORMAL HIGH (ref 2.3–15.9)
RBC.: 2.98 MIL/uL — ABNORMAL LOW (ref 4.22–5.81)
Retic Count, Absolute: 92.1 10*3/uL (ref 19.0–186.0)
Retic Ct Pct: 3.1 % (ref 0.4–3.1)

## 2021-07-31 LAB — LACTATE DEHYDROGENASE: LDH: 144 U/L (ref 98–192)

## 2021-07-31 MED ORDER — SODIUM CHLORIDE 0.9 % IV SOLN: INTRAVENOUS | Status: DC

## 2021-07-31 MED ORDER — GABAPENTIN 400 MG PO CAPS
400.0000 mg | ORAL_CAPSULE | Freq: Three times a day (TID) | ORAL | Status: DC
  Administered 2021-07-31: 11:00:00 800 mg via ORAL
  Administered 2021-07-31: 16:00:00 400 mg via ORAL
  Filled 2021-07-31: qty 2
  Filled 2021-07-31: qty 1

## 2021-07-31 MED ORDER — SENNA 8.6 MG PO TABS
2.0000 | ORAL_TABLET | Freq: Every day | ORAL | Status: DC
  Administered 2021-07-31 – 2021-08-01 (×2): 17.2 mg via ORAL
  Filled 2021-07-31 (×2): qty 2

## 2021-07-31 MED ORDER — GABAPENTIN 400 MG PO CAPS
400.0000 mg | ORAL_CAPSULE | Freq: Three times a day (TID) | ORAL | Status: DC
  Administered 2021-08-01: 09:00:00 400 mg via ORAL
  Filled 2021-07-31: qty 1

## 2021-07-31 MED ORDER — WHITE PETROLATUM EX OINT
TOPICAL_OINTMENT | CUTANEOUS | Status: AC
  Filled 2021-07-31: qty 28.35

## 2021-07-31 MED ORDER — SODIUM CHLORIDE 0.9% FLUSH: 10.0000 mL | INTRAVENOUS | Status: DC | PRN

## 2021-07-31 MED ORDER — SODIUM CHLORIDE 0.9% FLUSH
10.0000 mL | Freq: Two times a day (BID) | INTRAVENOUS | Status: DC
  Administered 2021-07-31 – 2021-08-05 (×7): 10 mL
  Administered 2021-08-06: 20 mL
  Administered 2021-08-06 – 2021-08-10 (×7): 10 mL

## 2021-07-31 MED ORDER — SODIUM CHLORIDE 0.9 % IV SOLN
2.0000 g | INTRAVENOUS | Status: DC
  Administered 2021-07-31: 21:00:00 2 g via INTRAVENOUS
  Filled 2021-07-31 (×2): qty 20

## 2021-07-31 MED ORDER — SODIUM ZIRCONIUM CYCLOSILICATE 10 G PO PACK
10.0000 g | PACK | Freq: Once | ORAL | Status: AC
  Administered 2021-07-31: 22:00:00 10 g via ORAL
  Filled 2021-07-31: qty 1

## 2021-07-31 MED ORDER — CALCIUM GLUCONATE-NACL 2-0.675 GM/100ML-% IV SOLN
2.0000 g | Freq: Once | INTRAVENOUS | Status: AC
  Administered 2021-07-31: 22:00:00 2000 mg via INTRAVENOUS
  Filled 2021-07-31: qty 100

## 2021-07-31 MED ORDER — OXYCODONE HCL 5 MG PO TABS
5.0000 mg | ORAL_TABLET | ORAL | Status: DC | PRN
Start: 2021-07-31 — End: 2021-08-07
  Administered 2021-07-31 – 2021-08-01 (×4): 15 mg via ORAL
  Administered 2021-08-01 – 2021-08-03 (×6): 10 mg via ORAL
  Administered 2021-08-03: 15 mg via ORAL
  Administered 2021-08-03 (×3): 10 mg via ORAL
  Administered 2021-08-04 (×3): 15 mg via ORAL
  Administered 2021-08-04: 10 mg via ORAL
  Administered 2021-08-05 (×2): 15 mg via ORAL
  Administered 2021-08-06: 10 mg via ORAL
  Administered 2021-08-06: 15 mg via ORAL
  Administered 2021-08-06: 10 mg via ORAL
  Filled 2021-07-31 (×2): qty 3
  Filled 2021-07-31 (×3): qty 2
  Filled 2021-07-31: qty 3
  Filled 2021-07-31: qty 2
  Filled 2021-07-31: qty 3
  Filled 2021-07-31: qty 2
  Filled 2021-07-31: qty 3
  Filled 2021-07-31: qty 2
  Filled 2021-07-31: qty 3
  Filled 2021-07-31: qty 2
  Filled 2021-07-31 (×2): qty 3
  Filled 2021-07-31: qty 2
  Filled 2021-07-31 (×3): qty 3
  Filled 2021-07-31: qty 2
  Filled 2021-07-31: qty 3
  Filled 2021-07-31 (×2): qty 2

## 2021-07-31 MED ORDER — SODIUM ZIRCONIUM CYCLOSILICATE 10 G PO PACK: 10.0000 g | PACK | Freq: Once | ORAL | Status: DC

## 2021-07-31 MED ORDER — CHLORHEXIDINE GLUCONATE CLOTH 2 % EX PADS
6.0000 | MEDICATED_PAD | Freq: Every day | CUTANEOUS | Status: DC
  Administered 2021-07-31 – 2021-08-10 (×8): 6 via TOPICAL

## 2021-07-31 NOTE — Progress Notes (Signed)
Family Medicine Teaching Service Daily Progress Note Intern Pager: [phone]  Patient name: Arthur Mcmahon Medical record number: [id number] Date of birth: 1966/07/14 Age: 55 y.o. Gender: male  Primary Care Provider: Shary Key, DO Consultants: Vascular surgery Code Status: Full  Pt Overview and Major Events to Date:  11/9 Admitted s/p vasc surg  Assessment and Plan: Arthur Mcmahon is a 55 y.o. male currently admitted with vascular complications and lymphedema due to metastatic bladder cancer. He was admitted 11/9 postoperatively after right common femoral endarterectomy, lower extremity thrombectomy and fasciotomies, now with a wound VAC placement. PMHx includes metastatic Stage IVA [id number] urothelial carcinoma of the bladder, (right hydroureteronephrosis) with widely metastatic lymphadenopathy (extensive retroperitoneal, pelvic, left supraclavicular, left axilla, left para-aortic, retrocrural adenopathy), right lower extremity ischemia s/p vascular surgery above, RLE edema (caused by extrinsic compression of right common femoral vein adenopathy), normocytic anemia, chronic hyponatremia, severe protein calorie malnutrition, and tobacco use.   POD #18 S/p debridement of necrosis of surgical incisions Yesterday afternoon, RN notified MD of developing 3+ pitting edema from right foot to ribs with abdominal and testicular involvement. At that time, vascular surgery recommended conservative measures. Overnight, 200 mL noted to drain from wound vac.  - appreciate vascular surgery recommendations - continue head of bed at 30 degrees - continue thigh high compression stockings ordered to right lower extremity - continue to monitor drain output - MWF wound vac changes/daily fasciotomy - continue tylenol scheduled - increase gabapentin to 400-800 mg TID - increase oxyIR to 5-15 mg q4 PRN  Hyperkalemia Awaiting AM CMP and 0500 EKG. Potassium yesterday morning 5.8, repeat in the evening 5.9.  Saturday PM EKG without peaked T waves.  - lokelma 10g x2 (0045 and 1000) - EKG 0500 and 1700 - AM CMP 0500   AKI  Awaiting AM CMP. AM creatinine 1.72, PM recheck 1.85. Notable rise from 0.92 on last check 11/17. Question if due to decreased intravascular volume from lymphedema and hypoalbuminemia.  - UA  - awaiting urine creatinine and sodium for FeNa calculation   Hypercalcemia  Awaiting AM CMP. Likely related to widely metastatic bladder cancer. Last albumin 1.7 on 11/11, calcium at that time 7.8. Calcium 9.2 on PM check yesterday, corrects with previous albumin to 11.  - awaiting AM CMP 0500 to recalculate corrected calcium   Constipation No BM recorded overnight. Last recorded BM 11/24. Continue miralax BID , senna daily, and colace BID PRN.  - add soap suds enema daily PRN  Metastatic urothelial carcinoma of the bladder Has oncology appointment scheduled 12/2 and urology 12/15.    Peripheral artery disease Continue atorvastatin 40 mg daily   Other problems chronic and stable: -Protein calorie malnutrition -Tobacco use disorder   FEN/GI: 2g sodium restricted PPx: Eliquis Dispo:Home with home health  pending clinical improvement . Barriers include worsening pitting edema, wound vac drainage.   Subjective:  Patient awake in bed. No acute distress. Reports pain 7/10 that prevents him from ambulating much. Says the swelling is mildly worse in groin and scrotum.   Objective: Temp:  [98 F (36.7 C)-98.7 F (37.1 C)] 98 F (36.7 C) (11/27 0727) Pulse Rate:  [69-96] 79 (11/27 0727) Resp:  [16-18] 17 (11/27 0320) BP: (107-119)/(57-84) 107/79 (11/27 0727) SpO2:  [95 %-100 %] 99 % (11/27 0727) Physical Exam: General: awake, alert, NAD Respiratory: no distress, normal effort, speaking in full sentences Abdomen: soft, non-distended, firm swelling in suprapubic area and bilateral groin Extremities: 3+ pitting edema on right from foot up  to nipples, left side trace  edema  Laboratory: No results for input(s): WBC, HGB, HCT, PLT in the last 168 hours. Recent Labs  Lab 07/30/21 1037 07/30/21 2129  NA 125* 123*  K 5.8* 5.9*  CL 91* 90*  CO2 25 23  BUN 41* 48*  CREATININE 1.72* 1.85*  CALCIUM 9.4 9.2  GLUCOSE 86 88    Imaging/Diagnostic Tests: None last 24 hours.   Arthur Essex, MD 07/31/2021, 7:38 AM PGY-2, [place] Intern pager: [phone], text pages welcome

## 2021-07-31 NOTE — Progress Notes (Signed)
[facility] [place]  Subjective  - no complaints, seems depressed.    Objective 107/79 79 98 F (36.7 C) (Oral) 17 99%  Intake/Output Summary (Last 24 hours) at 07/31/2021 0857 Last data filed at 07/31/2021 0600 Gross per 24 hour  Intake 360 ml  Output 650 ml  Net -290 ml     Right LE dressing clean and dry, significant edema Right groin wound vac to suction - will change tomorrow  Lungs non labored breathing Heart RRR  Assessment/Planning:  55 y.o. male is s/p:  sharp excisional debridement of right groin and bilateral right lower leg fasciotomy incisions. Brisk multiphasic right PT Doppler signal. 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  Pulse exam unchanged, with multiphasic posterior tibial artery signal, peroneal signal. Continue conservative care. Continues on Eliquis  Arthur Mcmahon has had a significant increase in the amount of edema in his right lower extremity due to lymphedema.  The patient has abdominal cancer that is obstructing blood flow from the right leg.  Initially, this lymphatic fluid was draining through his VAC sponge, however this is decreasing.  While this is great, in an effort to get him out of the Mcmahon, the lymphatic fluid is now stagnant in his right leg.   Unfortunately the only management is to decrease the malignant burden in his pelvis, which in theory should allow for improved lymphatic return.  There are no vascular surgery therapies.  He would benefit from compression and ambulation.  Per Arthur Mcmahon, he has not ambulated since his Mcmahon admission.    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  I told him I am concerned that with his burden of malignancy, should it not be managed, this could prove fatal, with the right lower extremity swelling  and flank edema continuing until that time.   Pt need TED hose and  ambulation.   RN for wound vac changes MWF, lower fasciotomy sites BID wet to dry with Dakins solution.    Arthur Mcmahon 07/31/2021 8:57 AM   --  Laboratory Lab Results: No results for input(s): WBC, HGB, HCT, PLT in the last 72 hours. BMET Recent Labs    07/30/21 1037 07/30/21 2129  NA 125* 123*  K 5.8* 5.9*  CL 91* 90*  CO2 25 23  GLUCOSE 86 88  BUN 41* 48*  CREATININE 1.72* 1.85*  CALCIUM 9.4 9.2    COAG Lab Results  Component Value Date   INR 1.0 06/18/2021   INR 1.1 04/11/2021   INR 0.9 03/22/2021   No results found for: PTT

## 2021-07-31 NOTE — Progress Notes (Signed)
FPTS Brief Progress Note  S: In to see patient with Dr. Madison Hickman.  RN was also at the bedside.  Patient reports that he is doing well, besides having leg pain.  He feels that his breathing is normal.  He denies any chest pain.  He does not recall peeing today though nurse reports 400 cc total UOP today.    O: BP 114/77 (BP Location: Right Arm)   Pulse (!) 107   Temp 98.4 F (36.9 C) (Oral)   Resp 12   Ht 6\' 2"  (1.88 m)   Wt 84.8 kg   SpO2 100%   BMI 24.01 kg/m   Gen: Awake, alert, chronically-ill appearing caucasian male sitting in bed.  Resp: Normal work of breathing, CTA B Cardio: RRR, diffuse 3+ pitting edema b/l up to thigh  Ext: RLE with bandage in place, LLE with TED compression socks to leg  Neuro: speech is clear. Oriented to person, place, time (notes it is 2022, and Barbette Or is president but does say it is December for year), no moving all extremities spontaneously, no focal deficits   A/P: Hyperkalemia Potassium elevated at 6.3. 10g of Lokelma, 2g calcium gluconate ordered. EKG STAT.  -BMP q4h  -Monitor on tele   Oliguric AKI  Azotemia  Only 400 cc UOP today. Creatinine increased to 2.24 from 1.85 yesterday and 0.92 ten days ago. BUN 57 -Urine protein/creatinine ratio -Follow BMP -Consider Nephrology consultation in AM   Hyponatremia: chronic  Thought to be due hypovolemia. On IVF.  -F/u BMP  Confusion Episode of confusion earlier this evening. He seems improved from this standpoint. UA concerning for UTI so he was started on CTX. His CXR showed b/l pleural effusions with atelectasis or infiltrate. He denies any respiratory symptoms and his breathing high percent on room air. -F/u Ucx. -F/u Bcx -Monitor mental status  Additional plan per day team progress note.   - Orders reviewed. Labs for AM ordered, which was adjusted as needed.   [name], DO 07/31/2021, 10:04 PM PGY-2, [place] [facility] Night Resident  Please page [phone] with  questions.

## 2021-07-31 NOTE — Progress Notes (Addendum)
Pt was assisted to [facility] by NT and RN. Pt was extremely weak getting off BSC which is a decline from baseline. Returned pt to bed and reassessed and noticed mottling on his LLE and RUE. Pt is very pale. HR has increased from baseline 70s to 122s sinus rhythm. Pt is A&Ox4 but states he "feels kind of confused" and was hallucinating that he "had a cigarette in his hand". Paged IMTS. MD came at bedside. Asked to page vascular for new mottling in RUE, LLE, and abd. RN paged vascular.  Raelyn Number, RN

## 2021-07-31 NOTE — Progress Notes (Signed)
FPTS Brief Note Reviewed patient's vitals, recent notes.  Vitals:   07/30/21 1927 07/30/21 2315  BP: 119/84 111/71  Pulse: 69 75  Resp: 16   Temp: 98.5 F (36.9 C) 98.2 F (36.8 C)  SpO2: 98% 97%   Hyperkalemia Potassium yesterday morning 5.8, repeat in the evening 5.9. PM EKG without peaked T waves.  - lokelma 10g x2 (0045 and 1000) - EKG 0500 and 1700 - AM CMP 0500  AKI  AM creatinine 1.72, PM recheck 1.85. Notable rise from 0.92 on last check 11/17. Question if due to decreased intravascular volume from lymphedema and hypoalbuminemia.  - UA - urine creatinine and sodium for FeNa calculation  Hypercalcemia  Last albumin 1.7 on 11/11, calcium at that time 7.8. Calcium 9.2 on PM check yesterday, corrects with previous albumin to 11.  - AM CMP 0500 to recalculate corrected calcium  Constipation Added on soap suds enema daily PRN if other PO options not producing BM.   [name], MD Page [phone] with questions about this patient.

## 2021-07-31 NOTE — Progress Notes (Addendum)
FPTS Interim Progress Note  S:Paged by nursing about new color changes in lower extremity bilaterally and difficulty with ambulating without additional assistance different from his baseline. Given acuity of situation on phone asked nursing to page vascular surgery  ROS: Denies leg pain, chest pain   O: BP 118/89 (BP Location: Right Arm)   Pulse (!) 122   Temp 97.8 F (36.6 C) (Oral)   Resp 17   Ht 6\' 2"  (1.88 m)   Wt 84.8 kg   SpO2 100%   BMI 24.01 kg/m   Gen: Alert and responsive to questions  Resp: no iWOB CV: Sinus tachycardia LE: mottled appearance bilaterally, worse on right foot. Feet non-tender to touch, not cold, pulses difficult to palpate with ongoing edema. 3+ pitting edema to right abdomen on right side. Left leg with 2+ edema. Sensation intact bilaterally  Right foot    Left abdomen/groin  Left leg   A/P: [place] Denies any pain, new from his baseline.  -Vascular surgery following, appreciate recommendations. Spoke with Dr. Unk Lightning who will re-assess patient  Confusion -Given current creatinine should only receive 1400 total gabapentin. Adjusted gabapentin to 400 TID.  -Oxycodone back to q4h 5-10mg  -Monitor mental status  Hyperkalemia 5.9 K, denies chest pain. HR in 120s. Has been having intermittent jerking. -Lokelma 10g x2 -BMP moved up to now    Gerrit Heck, MD 07/31/2021, 6:09 PM PGY-1, [facility] pager [phone]

## 2021-08-01 ENCOUNTER — Encounter (HOSPITAL_COMMUNITY): Payer: Self-pay | Admitting: Family Medicine

## 2021-08-01 ENCOUNTER — Inpatient Hospital Stay (HOSPITAL_COMMUNITY): Payer: Medicaid Other

## 2021-08-01 LAB — COMPREHENSIVE METABOLIC PANEL
ALT: 9 U/L (ref 0–44)
AST: 19 U/L (ref 15–41)
Albumin: 1.6 g/dL — ABNORMAL LOW (ref 3.5–5.0)
Alkaline Phosphatase: 81 U/L (ref 38–126)
Anion gap: 8 (ref 5–15)
BUN: 61 mg/dL — ABNORMAL HIGH (ref 6–20)
CO2: 23 mmol/L (ref 22–32)
Calcium: 9.4 mg/dL (ref 8.9–10.3)
Chloride: 93 mmol/L — ABNORMAL LOW (ref 98–111)
Creatinine, Ser: 2.43 mg/dL — ABNORMAL HIGH (ref 0.61–1.24)
GFR, Estimated: 31 mL/min — ABNORMAL LOW (ref >60)
Glucose, Bld: 77 mg/dL (ref 70–99)
Potassium: 5.7 mmol/L — ABNORMAL HIGH (ref 3.5–5.1)
Sodium: 124 mmol/L — ABNORMAL LOW (ref 135–145)
Total Bilirubin: 0.5 mg/dL (ref 0.3–1.2)
Total Protein: 5.9 g/dL — ABNORMAL LOW (ref 6.5–8.1)

## 2021-08-01 LAB — CBC
HCT: 25.5 % — ABNORMAL LOW (ref 39.0–52.0)
Hemoglobin: 8.2 g/dL — ABNORMAL LOW (ref 13.0–17.0)
MCH: 28.3 pg (ref 26.0–34.0)
MCHC: 32.2 g/dL (ref 30.0–36.0)
MCV: 87.9 fL (ref 80.0–100.0)
Platelets: 528 10*3/uL — ABNORMAL HIGH (ref 150–400)
RBC: 2.9 MIL/uL — ABNORMAL LOW (ref 4.22–5.81)
RDW: 15.3 % (ref 11.5–15.5)
WBC: 14.9 10*3/uL — ABNORMAL HIGH (ref 4.0–10.5)
nRBC: 0 % (ref 0.0–0.2)

## 2021-08-01 LAB — URINALYSIS, ROUTINE W REFLEX MICROSCOPIC
Bilirubin Urine: NEGATIVE
Glucose, UA: NEGATIVE mg/dL
Ketones, ur: NEGATIVE mg/dL
Nitrite: NEGATIVE
Protein, ur: 100 mg/dL — AB
Specific Gravity, Urine: 1.015 (ref 1.005–1.030)
WBC, UA: >50 WBC/hpf — ABNORMAL HIGH (ref 0–5)
pH: 5 (ref 5.0–8.0)

## 2021-08-01 LAB — BASIC METABOLIC PANEL
Anion gap: 9 (ref 5–15)
BUN: 64 mg/dL — ABNORMAL HIGH (ref 6–20)
CO2: 26 mmol/L (ref 22–32)
Calcium: 9.4 mg/dL (ref 8.9–10.3)
Chloride: 89 mmol/L — ABNORMAL LOW (ref 98–111)
Creatinine, Ser: 2.49 mg/dL — ABNORMAL HIGH (ref 0.61–1.24)
GFR, Estimated: 30 mL/min — ABNORMAL LOW (ref >60)
Glucose, Bld: 98 mg/dL (ref 70–99)
Potassium: 5.4 mmol/L — ABNORMAL HIGH (ref 3.5–5.1)
Sodium: 124 mmol/L — ABNORMAL LOW (ref 135–145)

## 2021-08-01 LAB — NA AND K (SODIUM & POTASSIUM), RAND UR
Potassium Urine: 40 mmol/L
Sodium, Ur: <10 mmol/L

## 2021-08-01 LAB — OSMOLALITY, URINE: Osmolality, Ur: 336 mOsm/kg (ref 300–900)

## 2021-08-01 LAB — SODIUM, URINE, RANDOM: Sodium, Ur: 24 mmol/L

## 2021-08-01 LAB — CREATININE, URINE, RANDOM: Creatinine, Urine: 98.38 mg/dL

## 2021-08-01 MED ORDER — GABAPENTIN 300 MG PO CAPS
300.0000 mg | ORAL_CAPSULE | Freq: Three times a day (TID) | ORAL | Status: DC
  Administered 2021-08-01 – 2021-08-06 (×15): 300 mg via ORAL
  Filled 2021-08-01 (×16): qty 1

## 2021-08-01 MED ORDER — LIDOCAINE HCL (PF) 1 % IJ SOLN
INTRAMUSCULAR | Status: AC
  Filled 2021-08-01: qty 30

## 2021-08-01 MED ORDER — SODIUM ZIRCONIUM CYCLOSILICATE 10 G PO PACK
10.0000 g | PACK | Freq: Once | ORAL | Status: AC
  Administered 2021-08-01: 11:00:00 10 g via ORAL
  Filled 2021-08-01: qty 1

## 2021-08-01 MED ORDER — SODIUM ZIRCONIUM CYCLOSILICATE 10 G PO PACK
10.0000 g | PACK | Freq: Every day | ORAL | Status: DC
  Administered 2021-08-01 – 2021-08-02 (×2): 10 g via ORAL
  Filled 2021-08-01 (×2): qty 1

## 2021-08-01 MED ORDER — ALBUMIN HUMAN 25 % IV SOLN
50.0000 g | Freq: Four times a day (QID) | INTRAVENOUS | Status: AC
  Administered 2021-08-01 – 2021-08-02 (×3): 50 g via INTRAVENOUS
  Filled 2021-08-01 (×3): qty 200

## 2021-08-01 MED ORDER — SODIUM CHLORIDE 0.9 % IV SOLN
1.0000 g | INTRAVENOUS | Status: DC
  Administered 2021-08-01 – 2021-08-02 (×2): 1 g via INTRAVENOUS
  Filled 2021-08-01 (×2): qty 10

## 2021-08-01 NOTE — Consult Note (Addendum)
[facility] Nephrology Consultation Note  Requesting MD: Dr. Talbert Cage Reason for consult: AKI  HPI:  Arthur Mcmahon is a 55 y.o. male with past medical history of hypertension, who was admitted with vascular complications and lymphedema due to metastatic bladder cancer, seen as a consultation for the evaluation of acute kidney injury.  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  Patient also with recent surgery for critical limb ischemia of the right lower extremity where patient had underwent endarterectomy and fistulotomy with thrombectomy and fasciotomy with wound VAC placement.  Right lower extremity edema due to extrinsic compression of the right common femoral vein by adenopathy,.  He also has history of hyponatremia, severe protein calorie malnutrition and tobacco use. He is now status post excisional debridement of the right groin and bilateral right leg fasciotomy incision.  Seen by vascular surgeon. He has baseline creatinine level around 0.80.9 however during admission on 11/26 the creatinine level was elevated to 1.72.  He also developed hyponatremia and hyperkalemia.  Treated with IV fluid without much improvement.  He also developed urinary tract infection however the culture was negative.  Treated with ceftriaxone.  The last CT scan with contrast was on 10/28. The urine output is recorded only 550 cc. Today's lab showed sodium 124, potassium 5.7, BUN 61 and creatinine level 2.43.  The serum albumin is 1.6.  He has leukocytosis and anemia.  The urinalysis with UTI and protein 30.  Few days ago urine sodium was 24 however no recent urine osmolality noted. He went to IR for the lymph node biopsy today. He feels fatigue and very low energy.  Denies nausea, vomiting, chest pain, shortness of breath.  He has external urinary catheter.   He received Lokelma for the management of hyperkalemia this morning.  Currently on IV fluid and ceftriaxone.  Creatinine, Ser  Date/Time Value Ref Range Status  08/01/2021 04:52 AM 2.43 (H) 0.61 - 1.24 mg/dL Final  07/31/2021 09:23 PM 2.18 (H) 0.61 - 1.24 mg/dL Final  07/31/2021 06:22 PM 2.24 (H) 0.61 - 1.24 mg/dL Final  07/31/2021 08:30 AM 2.20 (H) 0.61 - 1.24 mg/dL Final  07/30/2021 09:29 PM 1.85 (H) 0.61 - 1.24 mg/dL Final  07/30/2021 10:37 AM 1.72 (H) 0.61 - 1.24 mg/dL Final  07/21/2021 06:29 AM 0.92 0.61 - 1.24 mg/dL Final  07/20/2021 06:18 AM 0.85 0.61 - 1.24 mg/dL Final    PMHx:   Past Medical History:  Diagnosis Date   Anemia    Cancer ([place])    bladder   Constipation    ETOH abuse    Hypertension    Peripheral vascular disease ([place])    Pneumonia     Past Surgical History:  Procedure Laterality Date   ABDOMINAL AORTOGRAM W/LOWER EXTREMITY Bilateral 03/31/2021   Procedure: ABDOMINAL AORTOGRAM W/LOWER EXTREMITY;  Surgeon: [name], MD;  Location: [facility];  Service: Cardiovascular;  Laterality: Bilateral;   AMPUTATION Left 04/29/2021   Procedure: Left second Partial TOE AMPUTATION;  Surgeon: [name], MD;  Location: [place];  Service: Vascular;  Laterality: Left;   APPLICATION OF WOUND VAC Right 07/13/2021   Procedure: APPLICATION OF WOUND VAC OF RIGHT GROIN;  Surgeon: [name], MD;  Location: [facility];  Service: Vascular;  Laterality: Right;   ENDARTERECTOMY FEMORAL Left 04/11/2021   Procedure: LEFT FEMORAL ENDARTERECTOMY;  Surgeon: [name], MD;  Location:  [facility];  Service: Vascular;  Laterality: Left;   ENDARTERECTOMY FEMORAL Right 06/18/2021   Procedure: ENDARTERECTOMY RIGHT FEMORAL ARTERY;  Surgeon: [name], MD;  Location: [facility];  Service: Vascular;  Laterality: Right;   FASCIOTOMY Right 06/18/2021   Procedure: RIGHT LOWER EXTREMITY FASCIOTOMY;  Surgeon: [name], MD;  Location: [facility];   Service: Vascular;  Laterality: Right;   FRACTURE SURGERY Right    [place]   INSERTION OF ILIAC STENT Left 04/11/2021   Procedure: INSERTION OF LEFT ABOVE KNEE POPLITEAL STENT;  Surgeon: [name], MD;  Location: [facility];  Service: Vascular;  Laterality: Left;   INTRAOPERATIVE ARTERIOGRAM Left 04/11/2021   Procedure: INTRA OPERATIVE ARTERIOGRAM;  Surgeon: [name], MD;  Location: [facility];  Service: Vascular;  Laterality: Left;   PATCH ANGIOPLASTY Left 04/11/2021   Procedure: PATCH ANGIOPLASTY LEFT COMMON FEMORAL ARTERY, PROFUNDOPLASTY;  Surgeon: [name], MD;  Location: [facility];  Service: Vascular;  Laterality: Left;   PATCH ANGIOPLASTY Right 06/18/2021   Procedure: PATCH ANGIOPLASTY USING Rueben Bash BIOLOGIC PATCH;  Surgeon: [name], MD;  Location: [facility];  Service: Vascular;  Laterality: Right;   THROMBECTOMY FEMORAL ARTERY Right 06/18/2021   Procedure: THROMBECTOMY RIGHT LOWER EXTREMITY;  Surgeon: [name], MD;  Location: [facility];  Service: Vascular;  Laterality: Right;   THROMBECTOMY FEMORAL ARTERY Right 06/19/2021   Procedure: THROMBECTOMY RIGHT LOWER EXTREMITY VIA POPITEAL APPROACH;  Surgeon: [name], MD;  Location: [facility];  Service: Vascular;  Laterality: Right;   TONSILLECTOMY     TRANSURETHRAL RESECTION OF BLADDER TUMOR N/A 07/04/2021   Procedure: TRANSURETHRAL RESECTION OF BLADDER TUMOR (TURBT), LEFT RETROGRADE PYELOGRAM;  Surgeon: [name], MD;  Location[facility];  Service: Urology;  Laterality: N/A;   WOUND DEBRIDEMENT Right 07/13/2021   Procedure: DEBREDIEMENT OF RIGHT GROIN AND RIGHT LATERAL AND MEDIAL LOWER EXTREMITY FASCIOTOMY SITES;  Surgeon: [name], MD;  Location: [facility];  Service: Vascular;  Laterality: Right;  GROIN AND LOWER LEG    Family Hx:  Family History  Problem Relation Age of Onset   Cancer Mother    Heart failure Mother    Cancer Father     Social History:  reports that he has been smoking  cigarettes. He has a 15.00 pack-year smoking history. He has never used smokeless tobacco. He reports that he does not currently use alcohol. He reports current drug use. Drug: Marijuana.  Allergies: No Known Allergies  Medications: Prior to Admission medications   Medication Sig Start Date End Date Taking? Authorizing Provider  acetaminophen (TYLENOL) 500 MG tablet Take 500 mg by mouth every 6 (six) hours as needed for mild pain, fever or headache.   Yes [provider]  apixaban (ELIQUIS) 5 MG TABS tablet Take 1 tablet (5 mg total) by mouth 2 (two) times daily. 06/29/21  Yes Rhyne, Hulen Shouts, PA-C  HYDROcodone-acetaminophen (NORCO/VICODIN) 5-325 MG tablet Take 2 tablets by mouth every 6 (six) hours as needed for severe pain.   Yes [provider]  Multiple Vitamin (MULTIVITAMIN WITH MINERALS) TABS tablet Take 1 tablet by mouth daily. 06/22/21  Yes Rhyne, Hulen Shouts, PA-C  oxyCODONE-acetaminophen (PERCOCET) 10-325 MG tablet Take 1 tablet by mouth every 6 (six) hours as needed for pain. 07/12/21 07/12/22 Yes Setzer, Edman Circle, PA-C  senna-docusate (SENOKOT-S) 8.6-50 MG tablet Take 2 tablets by mouth 2 (two) times daily. 07/08/21  Yes Cherene Altes, MD    I have reviewed the  patient's current medications.  Labs:  Results for orders placed or performed during the hospital encounter of 07/13/21 (from the past 48 hour(s))  Basic metabolic panel     Status: Abnormal   Collection Time: 07/30/21  9:29 PM  Result Value Ref Range   Sodium 123 (L) 135 - 145 mmol/L   Potassium 5.9 (H) 3.5 - 5.1 mmol/L   Chloride 90 (L) 98 - 111 mmol/L   CO2 23 22 - 32 mmol/L   Glucose, Bld 88 70 - 99 mg/dL    Comment: Glucose reference range applies only to samples taken after fasting for at least 8 hours.   BUN 48 (H) 6 - 20 mg/dL   Creatinine, Ser 1.85 (H) 0.61 - 1.24 mg/dL   Calcium 9.2 8.9 - 10.3 mg/dL   GFR, Estimated 43 (L) >60 mL/min    Comment: (NOTE) Calculated using the CKD-EPI  Creatinine Equation (2021)    Anion gap 10 5 - 15    Comment: Performed at [facility] [address] [place], [place] [address]  Sodium, urine, random     Status: None   Collection Time: 07/30/21 11:52 PM  Result Value Ref Range   Sodium, Ur 24 mmol/L    Comment: Performed at [facility] [address] [place], [place] [address]  Creatinine, urine, random     Status: None   Collection Time: 07/30/21 11:52 PM  Result Value Ref Range   Creatinine, Urine 98.38 mg/dL    Comment: Performed at [facility] [address] [place], [place] [address]  Urinalysis, Routine w reflex microscopic     Status: Abnormal   Collection Time: 07/31/21  2:47 AM  Result Value Ref Range   Color, Urine YELLOW YELLOW   APPearance CLOUDY (A) CLEAR   Specific Gravity, Urine 1.015 1.005 - 1.030   pH 5.0 5.0 - 8.0   Glucose, UA NEGATIVE NEGATIVE mg/dL   Hgb urine dipstick LARGE (A) NEGATIVE   Bilirubin Urine NEGATIVE NEGATIVE   Ketones, ur NEGATIVE NEGATIVE mg/dL   Protein, ur 30 (A) NEGATIVE mg/dL   Nitrite NEGATIVE NEGATIVE   Leukocytes,Ua LARGE (A) NEGATIVE   RBC / HPF >50 (H) 0 - 5 RBC/hpf   WBC, UA >50 (H) 0 - 5 WBC/hpf   Bacteria, UA FEW (A) NONE SEEN   Squamous Epithelial / LPF 0-5 0 - 5   WBC Clumps PRESENT    Mucus PRESENT    Non Squamous Epithelial 0-5 (A) NONE SEEN    Comment: Performed at [facility], [place] [address], [place], [place] [address]  Comprehensive metabolic panel     Status: Abnormal   Collection Time: 07/31/21  8:30 AM  Result Value Ref Range   Sodium 123 (L) 135 - 145 mmol/L   Potassium 5.9 (H) 3.5 - 5.1 mmol/L   Chloride 89 (L) 98 - 111 mmol/L   CO2 25 22 - 32 mmol/L   Glucose, Bld 83 70 - 99 mg/dL    Comment: Glucose reference range applies only to samples taken after fasting for at least 8 hours.   BUN 49 (H) 6 - 20 mg/dL   Creatinine, Ser 2.20 (H) 0.61 - 1.24 mg/dL   Calcium 9.3 8.9 - 10.3 mg/dL   Total Protein 5.9 (L) 6.5 - 8.1 g/dL    Albumin 1.7 (L) 3.5 - 5.0 g/dL   AST 18 15 - 41 U/L   ALT 8 0 - 44 U/L  Alkaline Phosphatase 84 38 - 126 U/L   Total Bilirubin 1.0 0.3 - 1.2 mg/dL   GFR, Estimated 35 (L) >60 mL/min    Comment: (NOTE) Calculated using the CKD-EPI Creatinine Equation (2021)    Anion gap 9 5 - 15    Comment: Performed at [facility] [address], [place], [place] [address]  Basic metabolic panel     Status: Abnormal   Collection Time: 07/31/21  6:22 PM  Result Value Ref Range   Sodium 124 (L) 135 - 145 mmol/L   Potassium 6.3 (HH) 3.5 - 5.1 mmol/L    Comment: NO VISIBLE HEMOLYSIS T IRBY RN BY SSTEPHENS 2007 [id number]    Chloride 90 (L) 98 - 111 mmol/L   CO2 21 (L) 22 - 32 mmol/L   Glucose, Bld 88 70 - 99 mg/dL    Comment: Glucose reference range applies only to samples taken after fasting for at least 8 hours.   BUN 57 (H) 6 - 20 mg/dL   Creatinine, Ser 2.24 (H) 0.61 - 1.24 mg/dL   Calcium 9.4 8.9 - 10.3 mg/dL   GFR, Estimated 34 (L) >60 mL/min    Comment: (NOTE) Calculated using the CKD-EPI Creatinine Equation (2021)    Anion gap 13 5 - 15    Comment: Performed at [facility] [address], [place], [place] [address]  CBC     Status: Abnormal   Collection Time: 07/31/21  6:22 PM  Result Value Ref Range   WBC 18.7 (H) 4.0 - 10.5 K/uL   RBC 3.26 (L) 4.22 - 5.81 MIL/uL   Hemoglobin 9.2 (L) 13.0 - 17.0 g/dL   HCT 28.7 (L) 39.0 - 52.0 %   MCV 88.0 80.0 - 100.0 fL   MCH 28.2 26.0 - 34.0 pg   MCHC 32.1 30.0 - 36.0 g/dL   RDW 15.2 11.5 - 15.5 %   Platelets 534 (H) 150 - 400 K/uL   nRBC 0.0 0.0 - 0.2 %    Comment: Performed at [facility] [address], [place], [place] [address]  Hepatic function panel     Status: Abnormal   Collection Time: 07/31/21  6:40 PM  Result Value Ref Range   Total Protein 6.4 (L) 6.5 - 8.1 g/dL   Albumin 1.8 (L) 3.5 - 5.0 g/dL   AST 26 15 - 41 U/L   ALT 9 0 - 44 U/L   Alkaline Phosphatase 91 38 - 126 U/L   Total Bilirubin 0.6 0.3 - 1.2  mg/dL   Bilirubin, Direct 0.2 0.0 - 0.2 mg/dL   Indirect Bilirubin 0.4 0.3 - 0.9 mg/dL    Comment: Performed at [facility] [address] [place], [place] [address]  Hepatic function panel     Status: Abnormal   Collection Time: 07/31/21  9:23 PM  Result Value Ref Range   Total Protein 6.1 (L) 6.5 - 8.1 g/dL   Albumin 1.6 (L) 3.5 - 5.0 g/dL   AST 19 15 - 41 U/L   ALT 9 0 - 44 U/L   Alkaline Phosphatase 82 38 - 126 U/L   Total Bilirubin 0.4 0.3 - 1.2 mg/dL   Bilirubin, Direct 0.1 0.0 - 0.2 mg/dL   Indirect Bilirubin 0.3 0.3 - 0.9 mg/dL    Comment: Performed at [facility] [address], [place], [place] [address]  Lactate dehydrogenase     Status: None   Collection Time: 07/31/21  9:23 PM  Result Value Ref Range   LDH 144 98 - 192 U/L    Comment: Performed at [facility], [place] [address] [place], [place] [address]  Reticulocytes     Status: Abnormal   Collection Time: 07/31/21  9:23 PM  Result Value Ref Range   Retic Ct Pct 3.1 0.4 - 3.1 %   RBC. 2.98 (L) 4.22 - 5.81 MIL/uL   Retic Count, Absolute 92.1 19.0 - 186.0 K/uL   Immature Retic Fract 28.8 (H) 2.3 - 15.9 %    Comment: Performed at [facility] [address] [place], [place] [address]  Basic metabolic panel     Status: Abnormal   Collection Time: 07/31/21  9:23 PM  Result Value Ref Range   Sodium 124 (L) 135 - 145 mmol/L   Potassium 6.0 (H) 3.5 - 5.1 mmol/L   Chloride 90 (L) 98 - 111 mmol/L   CO2 23 22 - 32 mmol/L   Glucose, Bld 80 70 - 99 mg/dL    Comment: Glucose reference range applies only to samples taken after fasting for at least 8 hours.   BUN 59 (H) 6 - 20 mg/dL   Creatinine, Ser 2.18 (H) 0.61 - 1.24 mg/dL   Calcium 9.4 8.9 - 10.3 mg/dL   GFR, Estimated 35 (L) >60 mL/min    Comment: (NOTE) Calculated using the CKD-EPI Creatinine Equation (2021)    Anion gap 11 5 - 15    Comment: Performed at [facility] [address] [place], [place] [address]  Culture, blood  (routine x 2)     Status: None (Preliminary result)   Collection Time: 07/31/21  9:47 PM   Specimen: BLOOD RIGHT HAND  Result Value Ref Range   Specimen Description BLOOD RIGHT HAND    Special Requests      BOTTLES DRAWN AEROBIC AND ANAEROBIC Blood Culture adequate volume   Culture      NO GROWTH < 12 HOURS Performed at [facility], [place] [address], [place], [place] [address]    Report Status PENDING   Culture, blood (routine x 2)     Status: None (Preliminary result)   Collection Time: 07/31/21  9:58 PM   Specimen: BLOOD  Result Value Ref Range   Specimen Description BLOOD RIGHT ANTECUBITAL    Special Requests      BOTTLES DRAWN AEROBIC AND ANAEROBIC Blood Culture adequate volume   Culture      NO GROWTH < 12 HOURS Performed at [facility], [place] [address] [place], [place] [address]    Report Status PENDING   CBC     Status: Abnormal   Collection Time: 08/01/21  4:52 AM  Result Value Ref Range   WBC 14.9 (H) 4.0 - 10.5 K/uL   RBC 2.90 (L) 4.22 - 5.81 MIL/uL   Hemoglobin 8.2 (L) 13.0 - 17.0 g/dL   HCT 25.5 (L) 39.0 - 52.0 %   MCV 87.9 80.0 - 100.0 fL   MCH 28.3 26.0 - 34.0 pg   MCHC 32.2 30.0 - 36.0 g/dL   RDW 15.3 11.5 - 15.5 %   Platelets 528 (H) 150 - 400 K/uL   nRBC 0.0 0.0 - 0.2 %    Comment: Performed at [facility], [place] [address], [place], [place] [address]  Comprehensive metabolic panel     Status: Abnormal   Collection Time: 08/01/21  4:52 AM  Result Value Ref Range   Sodium 124 (L)  135 - 145 mmol/L   Potassium 5.7 (H) 3.5 - 5.1 mmol/L   Chloride 93 (L) 98 - 111 mmol/L   CO2 23 22 - 32 mmol/L   Glucose, Bld 77 70 - 99 mg/dL    Comment: Glucose reference range applies only to samples taken after fasting for at least 8 hours.   BUN 61 (H) 6 - 20 mg/dL   Creatinine, Ser 2.43 (H) 0.61 - 1.24 mg/dL   Calcium 9.4 8.9 - 10.3 mg/dL   Total Protein 5.9 (L) 6.5 - 8.1 g/dL   Albumin 1.6 (L) 3.5 - 5.0 g/dL   AST 19 15 - 41 U/L   ALT 9 0 - 44 U/L    Alkaline Phosphatase 81 38 - 126 U/L   Total Bilirubin 0.5 0.3 - 1.2 mg/dL   GFR, Estimated 31 (L) >60 mL/min    Comment: (NOTE) Calculated using the CKD-EPI Creatinine Equation (2021)    Anion gap 8 5 - 15    Comment: Performed at [facility], [place] [address], [place], [place] [address]     ROS:  Pertinent items noted in HPI and remainder of comprehensive ROS otherwise negative.  Physical Exam: Vitals:   08/01/21 1450 08/01/21 1527  BP: 113/76 110/76  Pulse:  80  Resp:  12  Temp:  97.6 F (36.4 C)  SpO2:  100%     General exam: Appears calm and comfortable  Respiratory system: Clear to auscultation. Respiratory effort normal. No wheezing or crackle Cardiovascular system: S1 & S2 heard, RRR.  No left leg edema however right leg has edema due to lymphedema. Gastrointestinal system: Abdomen is nondistended, soft and nontender. Normal bowel sounds heard. Central nervous system: Alert and oriented. No focal neurological deficits. Extremities: Right leg is edematous, dressing applied and has groin wound VAC Skin: No rashes, lesions or ulcers Psychiatry: Judgement and insight appear normal. Mood & affect appropriate.   Assessment/Plan:  #Acute kidney injury, nonoliguric: Likely ischemic ATN due to intravascular volume depletion/sepsis/surgery with hemodynamic changes +/- right hydronephrosis.  I will check CK level because of fasciotomies/leg surgery.  Urinalysis with UTI and minimal protein.  The recent ultrasound showed improved right hydronephrosis.  No recent IV contrast or renal insult identified.  He has very low albumin level with third spacing.  I will repeat UA, urine electrolytes.  We will continue IV fluid and add albumin to augment intravascular volume.  Continue strict ins and outs and daily lab.  No need for dialysis at this time.  Continue to monitor.  #Hyponatremia: He looks euvolemic on exam.  TSH level elevated consistent with hypothyroidism.  This can cause  hyponatremia therefore recommend to start thyroid hormone.  Cortisol level acceptable.  I will repeat urine sodium and check urine osmolality.  We will continue IV fluid today and monitor lab.  He may also have high ADH state in the setting of malignancy.   #Hyperkalemia: Continue Lokelma and monitor lab.  #Metastatic urothelial carcinoma of the bladder: s/p TURBT on 10/31. Oncology is following and he underwent IR guided lymph node biopsy today.  Also seen by urologist for right hydronephrosis.  #Severe protein calorie malnutrition: Continue Ensure and follow dietitian.  #Sepsis of right groin: s/p debridement of necrosis of surgical incision site: Currently has right groin wound VAC.  Wet-to-dry dressing for the fasciotomies and right lower extremity.  Thank you for the consult.  We will follow with you.  Adriaan Maltese Tanna Furry 08/01/2021, 4:23 PM  [facility].

## 2021-08-01 NOTE — Progress Notes (Signed)
Dressing changed at then fasciotomy sites as ordered.

## 2021-08-01 NOTE — Progress Notes (Addendum)
PT Cancellation Note  Patient Details Name: Arthur Mcmahon MRN: [id number] DOB: Feb 06, 1966   Cancelled Treatment:    Reason Eval/Treat Not Completed: Patient at procedure or test/unavailable. Pt in IR. Nursing reports pt is very weak today.    Shary Decamp [facility] 08/01/2021, 6:42 PM [place] Pager [phone] Office [phone]

## 2021-08-01 NOTE — Progress Notes (Signed)
Progress Note - Oncology  Spoke with Dr. Truitt Merle, in Oncology about patient. Asked if she would be able to come evaluate the patient as to determine staging and what therapy options we have available. Dr. Burr Medico was agreeable to see the patient, but noted she would likely not start chemotherapy while patient was inpatient.   [place] - PGY-1

## 2021-08-01 NOTE — Consult Note (Addendum)
[place]  [phone] [phone]   MEDICAL ONCOLOGY - INITIAL CONSULTATION  Referral MD: Dr. Talbert Cage  Reason for Referral: Metastatic urothelial carcinoma  HPI: Mr. Schueller is a 55 year old male with a past medical history significant for hypertension.  He was recently referred to the cancer center for new diagnosis of bladder cancer but has not yet been seen in our office.  Has outpatient appointment scheduled this Friday, 08/05/2021, with Dr. Alen Blew.  He is currently admitted to the hospital following a procedure by vascular surgery which was a right groin and right lower extremity incisional debridement and application of wound VAC.  The patient initially underwent TURBT on 07/04/2021.  Following this procedure, he developed serous/emphatic drainage of the right groin incision and was discharged with a wound VAC.  He was followed as an outpatient by vascular surgery.  Due to persistent drainage, he was brought to the OR on 07/13/2021 for right groin incision and drainage and debridement of the right lower leg fasciotomy incisions.  Postoperatively, he was admitted to the family medicine teaching service.  With regards to his new diagnosis of urothelial carcinoma, a CTA aortobifemoral was obtained on 06/18/2021 which showed numerous bulky retroperitoneal, iliac, and pelvic sidewall lymph nodes.  A CT hematuria work-up was obtained on 07/01/2021 which showed a large (7 cm) irregular right sided bladder mass likely obstructing the right ureter with marked right-sided hydroureteronephrosis, extensive retroperitoneal and pelvic adenopathy.  A CT of the chest with contrast was performed on 07/01/2021 which showed pathologic adenopathy within the left supraclavicular region, left axilla, and left periaortic region consistent with metastatic disease.  There are also borderline enlarged lymph nodes within the retrocrural region and retroperitoneum consistent with  metastatic disease.  I met with the patient and his sister who was at the bedside.  The patient tells me that he has had a poor appetite but states that it has been up and down for some time.  He denies weight loss, but his sister has noted significant weight loss.  He has been having intermittent hematuria for the past 2 to 3 years.  No other bleeding reported.  He reports lower extremity edema, right greater than left which has been going on for several months.  He currently denies headaches and dizziness.  He denies chest pain, shortness of breath, abdominal pain, nausea, vomiting.  He reported constipation last week which has now resolved.  The patient is divorced and has 2 children.  He has a sister who lives in [place].  He states that he lives with several roommates.  The patient told me that he previously drank about a 12 pack of beer per day but quit several months ago.  However, his sister spoke to me outside of the room and states that he was drinking heavily up until hospital admission.  He smokes 1 to 2 packs of cigarettes per day.  His sister describes him as a "chain smoker."  Family history significant for a mother with lung cancer, maternal grandmother with metastatic cancer, and father with throat cancer.  Medical oncology was asked to see the patient for recommendations regarding his newly diagnosed urothelial carcinoma.  Past Medical History:  Diagnosis Date   Anemia    Cancer ([place])    bladder   Constipation    ETOH abuse    Hypertension    Peripheral vascular disease ([place])    Pneumonia   :   Past Surgical History:  Procedure Laterality Date  ABDOMINAL AORTOGRAM W/LOWER EXTREMITY Bilateral 03/31/2021   Procedure: ABDOMINAL AORTOGRAM W/LOWER EXTREMITY;  Surgeon: [name], MD;  Location: [facility];  Service: Cardiovascular;  Laterality: Bilateral;   AMPUTATION Left 04/29/2021   Procedure: Left second Partial TOE AMPUTATION;  Surgeon: [name], MD;  Location: [facility];  Service: Vascular;  Laterality: Left;   APPLICATION OF WOUND VAC Right 07/13/2021   Procedure: APPLICATION OF WOUND VAC OF RIGHT GROIN;  Surgeon: [name], MD;  Location: [facility];  Service: Vascular;  Laterality: Right;   ENDARTERECTOMY FEMORAL Left 04/11/2021   Procedure: LEFT FEMORAL ENDARTERECTOMY;  Surgeon: [name], MD;  Location: [facility];  Service: Vascular;  Laterality: Left;   ENDARTERECTOMY FEMORAL Right 06/18/2021   Procedure: ENDARTERECTOMY RIGHT FEMORAL ARTERY;  Surgeon: [name], MD;  Location: [facility];  Service: Vascular;  Laterality: Right;   FASCIOTOMY Right 06/18/2021   Procedure: RIGHT LOWER EXTREMITY FASCIOTOMY;  Surgeon: [name], MD;  Location: [facility];  Service: Vascular;  Laterality: Right;   FRACTURE SURGERY Right    femur   INSERTION OF ILIAC STENT Left 04/11/2021   Procedure: INSERTION OF LEFT ABOVE KNEE POPLITEAL STENT;  Surgeon: [name], MD;  Location: [facility];  Service: Vascular;  Laterality: Left;   INTRAOPERATIVE ARTERIOGRAM Left 04/11/2021   Procedure: INTRA OPERATIVE ARTERIOGRAM;  Surgeon: [name], MD;  Location: [facility];  Service: Vascular;  Laterality: Left;   PATCH ANGIOPLASTY Left 04/11/2021   Procedure: PATCH ANGIOPLASTY LEFT COMMON FEMORAL ARTERY, PROFUNDOPLASTY;  Surgeon: [name], MD;  Location: [facility];  Service: Vascular;  Laterality: Left;   PATCH ANGIOPLASTY Right 06/18/2021   Procedure: PATCH ANGIOPLASTY USING Rueben Bash BIOLOGIC PATCH;  Surgeon: [name], MD;  Location: [facility];  Service: Vascular;  Laterality: Right;   THROMBECTOMY FEMORAL ARTERY Right 06/18/2021   Procedure: THROMBECTOMY RIGHT LOWER EXTREMITY;  Surgeon: [name], MD;  Location: [facility];  Service: Vascular;  Laterality: Right;   THROMBECTOMY FEMORAL ARTERY Right 06/19/2021   Procedure: THROMBECTOMY RIGHT LOWER EXTREMITY VIA POPITEAL APPROACH;  Surgeon: [name], MD;  Location: [facility];  Service: Vascular;  Laterality: Right;   TONSILLECTOMY     TRANSURETHRAL RESECTION OF BLADDER TUMOR N/A 07/04/2021   Procedure: TRANSURETHRAL RESECTION OF BLADDER TUMOR (TURBT), LEFT RETROGRADE PYELOGRAM;  Surgeon: [name], MD;  Location: [facility];  Service: Urology;  Laterality: N/A;   WOUND DEBRIDEMENT Right 07/13/2021   Procedure: DEBREDIEMENT OF RIGHT GROIN AND RIGHT LATERAL AND MEDIAL LOWER EXTREMITY FASCIOTOMY SITES;  Surgeon: [name], MD;  Location: [facility];  Service: Vascular;  Laterality: Right;  GROIN AND LOWER LEG  :   Current Facility-Administered Medications  Medication Dose Route Frequency Provider Last Rate Last Admin   0.9 %  sodium chloride infusion   Intravenous Continuous McDiarmid, Blane Ohara, MD 100 mL/hr at 08/01/21 0221 New Bag at 08/01/21 0221   acetaminophen (TYLENOL) tablet 1,000 mg  1,000 mg Oral Q6H Brimage, Vondra, DO   1,000 mg at 08/01/21 0528   apixaban (ELIQUIS) tablet 5 mg  5 mg Oral BID Brimage, Vondra, DO   5 mg at 08/01/21 0857   atorvastatin (LIPITOR) tablet 40 mg  40 mg Oral Daily Gerrit Heck, MD   40 mg at 08/01/21 0858   cefTRIAXone (ROCEPHIN) 2 g in sodium chloride 0.9 % 100 mL IVPB  2 g Intravenous Q24H Brimage, Vondra, DO   Stopped at 07/31/21 2200  Chlorhexidine Gluconate Cloth 2 % PADS 6 each  6 each Topical Daily Lind Covert, MD   6 each at 07/31/21 1840   docusate sodium (COLACE) capsule 100 mg  100 mg Oral BID PRN [name], MD   100 mg at 07/29/21 1138   feeding supplement (ENSURE ENLIVE / ENSURE PLUS) liquid 237 mL  237 mL Oral BID BM Chambliss, Jeb Levering, MD   237 mL at 07/31/21 1112   gabapentin (NEURONTIN) capsule 400 mg  400 mg Oral TID Lyndee Hensen, DO   400 mg at 08/01/21 0858   multivitamin with minerals tablet 1 tablet  1 tablet Oral Daily Lind Covert, MD   1 tablet at 08/01/21 1655   nutrition supplement (JUVEN) (JUVEN) powder packet 1 packet  1 packet  Oral BID BM Lind Covert, MD   1 packet at 08/01/21 3748   oxyCODONE (Oxy IR/ROXICODONE) immediate release tablet 5-15 mg  5-15 mg Oral Q4H PRN [name], MD   10 mg at 08/01/21 0913   polyethylene glycol (MIRALAX / GLYCOLAX) packet 34 g  34 g Oral BID [name], DO   34 g at 08/01/21 0858   senna (SENOKOT) tablet 17.2 mg  2 tablet Oral Daily McDiarmid, Blane Ohara, MD   17.2 mg at 08/01/21 0857   sodium chloride flush (NS) 0.9 % injection 10-40 mL  10-40 mL Intracatheter Q12H Chambliss, Jeb Levering, MD   10 mL at 07/31/21 2101   sodium chloride flush (NS) 0.9 % injection 10-40 mL  10-40 mL Intracatheter PRN Lind Covert, MD         No Known Allergies:   Family History  Problem Relation Age of Onset   Cancer Mother    Heart failure Mother    Cancer Father   :   Social History   Socioeconomic History   Marital status: Divorced    Spouse name: Not on file   Number of children: Not on file   Years of education: Not on file   Highest education level: Not on file  Occupational History   Not on file  Tobacco Use   Smoking status: Every Day    Packs/day: 0.50    Years: 30.00    Pack years: 15.00    Types: Cigarettes   Smokeless tobacco: Never  Vaping Use   Vaping Use: Never used  Substance and Sexual Activity   Alcohol use: Not Currently   Drug use: Yes    Types: Marijuana    Comment: 2 times a week   Sexual activity: Not on file  Other Topics Concern   Not on file  Social History Narrative   Not on file   Social Determinants of Health   Financial Resource Strain: Not on file  Food Insecurity: Not on file  Transportation Needs: Not on file  Physical Activity: Not on file  Stress: Not on file  Social Connections: Not on file  Intimate Partner Violence: Not on file  :  Review of Systems: A comprehensive 14 point review of systems was negative except as noted in the HPI.  Exam: Patient Vitals for the past 24 hrs:  BP Temp Temp src  Pulse Resp SpO2 Weight  08/01/21 0853 114/71 97.6 F (36.4 C) Oral 82 17 100 % --  08/01/21 0500 -- -- -- -- -- -- 93 kg  08/01/21 0339 120/73 97.8 F (36.6 C) Oral 84 18 100 % --  07/31/21 2311 112/76 97.9 F (36.6 C) Axillary  92 11 100 % --  07/31/21 1959 114/77 98.4 F (36.9 C) Oral (!) 107 12 100 % --  07/31/21 1833 -- -- -- (!) 114 -- -- --  07/31/21 1753 -- -- -- (!) 116 -- -- --  07/31/21 1700 118/89 97.8 F (36.6 C) Oral (!) 122 17 -- --  07/31/21 1115 115/71 97.7 F (36.5 C) Oral 74 17 100 % --    General: Chronically ill-appearing male, no distress. Eyes:  no scleral icterus.   ENT:  There were no oropharyngeal lesions.    Lymphatics: Palpable left supraclavicular and left axillary lymphadenopathy.  He has palpable inguinal adenopathy. Respiratory: lungs were clear bilaterally without wheezing or crackles.   Cardiovascular:  Regular rate and rhythm, S1/S2, without murmur, rub or gallop.  Bilateral lower extremity edema, right greater than left GI: Positive bowel sounds, mildly distended, nontender   Skin: Wound VAC in place right groin. Neuro exam was nonfocal. Patient was alert and oriented.    Lab Results  Component Value Date   WBC 14.9 (H) 08/01/2021   HGB 8.2 (L) 08/01/2021   HCT 25.5 (L) 08/01/2021   PLT 528 (H) 08/01/2021   GLUCOSE 77 08/01/2021   CHOL 102 06/19/2021   TRIG 73 06/19/2021   HDL 38 (L) 06/19/2021   LDLCALC 49 06/19/2021   ALT 9 08/01/2021   AST 19 08/01/2021   NA 124 (L) 08/01/2021   K 5.7 (H) 08/01/2021   CL 93 (L) 08/01/2021   CREATININE 2.43 (H) 08/01/2021   BUN 61 (H) 08/01/2021   CO2 23 08/01/2021    DG Chest 2 View  Result Date: 07/31/2021 CLINICAL DATA:  Sepsis, pneumonia. EXAM: CHEST - 2 VIEW COMPARISON:  06/30/2021. FINDINGS: The heart size and mediastinal contours are within normal limits. There are small bilateral pleural effusions with atelectasis or infiltrate at the lung bases, best seen on lateral view. No pneumothorax  is seen. No acute osseous abnormality. IMPRESSION: Small bilateral pleural effusions with atelectasis or infiltrate. Electronically Signed   By: Brett Fairy M.D.   On: 07/31/2021 21:00   US RENAL  Result Date: 08/01/2021 CLINICAL DATA:  The acute kidney injury EXAM: RENAL / URINARY TRACT ULTRASOUND COMPLETE COMPARISON:  CT abdomen pelvis 07/01/2021 FINDINGS: Right Kidney: Renal measurements: 10.7 x 5.1 x 5.3 cm = volume: 149 mL. Mild hydronephrosis. Cortical thickness within normal limits. Left Kidney: Renal measurements: 12.1 x 6.1 x 5.9 cm = volume: 231 mL. Echogenicity within normal limits. No hydronephrosis. The 2.5 cm simple cyst present unit kidney. Bladder: Unable to evaluate due to collapse configuration. Other: Incidental note of small right pleural effusion. IMPRESSION: Unchanged mild right hydronephrosis. Electronically Signed   By: Miachel Roux M.D.   On: 08/01/2021 07:49   DG C-Arm 1-60 Min-No Report  Result Date: 07/04/2021 Fluoroscopy was utilized by the requesting physician.  No radiographic interpretation.   ECHOCARDIOGRAM COMPLETE  Result Date: 07/03/2021    ECHOCARDIOGRAM REPORT   Patient Name:   JAHAZIEL FRANCOIS Date of Exam: 07/03/2021 Medical Rec #:  [id number]      Height:       74.0 in Accession #:    [id number]     Weight:       187.0 lb Date of Birth:  [birth year]      BSA:          2.112 m Patient Age:    10 years       BP:  101/65 mmHg Patient Gender: M              HR:           79 bpm. Exam Location:  [facility] Procedure: 2D Echo, Cardiac Doppler and Color Doppler Indications:    Bacteremia  History:        Patient has no prior history of Echocardiogram examinations.                 Risk Factors:Hypertension and Current Smoker. PAD, ETOH abuse.  Sonographer:    Alvino Chapel RCS Referring Phys: [place]  1. Left ventricular ejection fraction, by estimation, is 60 to 65%. The left ventricle has normal function. The left ventricle has no  regional wall motion abnormalities. Left ventricular diastolic parameters were normal.  2. Right ventricular systolic function is normal. The right ventricular size is normal. Tricuspid regurgitation signal is inadequate for assessing PA pressure.  3. Right atrial size was mildly dilated.  4. The mitral valve is normal in structure. No evidence of mitral valve regurgitation. No evidence of mitral stenosis.  5. The aortic valve was not well visualized. Aortic valve regurgitation is not visualized. No aortic stenosis is present.  6. The inferior vena cava is normal in size with greater than 50% respiratory variability, suggesting right atrial pressure of 3 mmHg. Conclusion(s)/Recommendation(s): No evidence of valvular vegetations on this transthoracic echocardiogram. Would recommend a transesophageal echocardiogram to exclude infective endocarditis if clinically indicated. FINDINGS  Left Ventricle: Left ventricular ejection fraction, by estimation, is 60 to 65%. The left ventricle has normal function. The left ventricle has no regional wall motion abnormalities. The left ventricular internal cavity size was normal in size. There is  no left ventricular hypertrophy. Left ventricular diastolic parameters were normal. Normal left ventricular filling pressure. Right Ventricle: The right ventricular size is normal. No increase in right ventricular wall thickness. Right ventricular systolic function is normal. Tricuspid regurgitation signal is inadequate for assessing PA pressure. Left Atrium: Left atrial size was normal in size. Right Atrium: Right atrial size was mildly dilated. Pericardium: There is no evidence of pericardial effusion. Mitral Valve: The mitral valve is normal in structure. No evidence of mitral valve regurgitation. No evidence of mitral valve stenosis. Tricuspid Valve: The tricuspid valve is normal in structure. Tricuspid valve regurgitation is not demonstrated. No evidence of tricuspid stenosis. Aortic  Valve: The aortic valve was not well visualized. Aortic valve regurgitation is not visualized. No aortic stenosis is present. Pulmonic Valve: The pulmonic valve was normal in structure. Pulmonic valve regurgitation is not visualized. No evidence of pulmonic stenosis. Aorta: The aortic root is normal in size and structure. Venous: The inferior vena cava is normal in size with greater than 50% respiratory variability, suggesting right atrial pressure of 3 mmHg. IAS/Shunts: The interatrial septum appears to be lipomatous. No atrial level shunt detected by color flow Doppler.  LEFT VENTRICLE PLAX 2D LVIDd:         5.10 cm   Diastology LVIDs:         3.20 cm   LV e' medial:    12.60 cm/s LV PW:         0.90 cm   LV E/e' medial:  6.8 LV IVS:        0.90 cm   LV e' lateral:   15.40 cm/s LVOT diam:     2.20 cm   LV E/e' lateral: 5.6 LV SV:  82 LV SV Index:   39 LVOT Area:     3.80 cm  RIGHT VENTRICLE RV S prime:     15.50 cm/s TAPSE (M-mode): 2.8 cm LEFT ATRIUM             Index        RIGHT ATRIUM           Index LA diam:        3.00 cm 1.42 cm/m   RA Area:     21.30 cm LA Vol (A2C):   44.0 ml 20.83 ml/m  RA Volume:   72.80 ml  34.47 ml/m LA Vol (A4C):   41.3 ml 19.55 ml/m LA Biplane Vol: 46.5 ml 22.01 ml/m  AORTIC VALVE LVOT Vmax:   114.00 cm/s LVOT Vmean:  76.300 cm/s LVOT VTI:    0.216 m  AORTA Ao Root diam: 3.50 cm MITRAL VALVE MV Area (PHT): 3.42 cm    SHUNTS MV Decel Time: 222 msec    Systemic VTI:  0.22 m MV E velocity: 85.70 cm/s  Systemic Diam: 2.20 cm MV A velocity: 67.20 cm/s MV E/A ratio:  1.28 Fransico Him MD Electronically signed by Fransico Him MD Signature Date/Time: 07/03/2021/3:53:25 PM    Final    VAS [place] LOWER EXTREMITY VENOUS (DVT)  Result Date: 07/24/2021  Lower Venous DVT Study Patient Name:  JOANNA HALL  Date of Exam:   07/23/2021 Medical Rec #: [id number]       Accession #:    [id number] Date of Birth: 26-Sep-1965       Patient Gender: M Patient Age:   51 years Exam Location:   [facility] Procedure:      VAS [place] LOWER EXTREMITY VENOUS (DVT) Referring Phys: Gwyndolyn Saxon HENSEL --------------------------------------------------------------------------------  Indications: Pitting edema.  Risk Factors: Recent vascular intervention including fasciotomy, followed by debridement of fasciotomies and wound vac placement in right groin. Limitations: Bandages, right groin wound vac, poor ultrasound/tissue interface, and significant edema. Comparison Study: No prior study on file Performing Technologist: Sharion Dove RVS  Examination Guidelines: A complete evaluation includes B-mode imaging, spectral Doppler, color Doppler, and power Doppler as needed of all accessible portions of each vessel. Bilateral testing is considered an integral part of a complete examination. Limited examinations for reoccurring indications may be performed as noted. The reflux portion of the exam is performed with the patient in reverse Trendelenburg.  +---------+---------------+---------+-----------+----------+-------------------+ RIGHT    CompressibilityPhasicitySpontaneityPropertiesThrombus Aging      +---------+---------------+---------+-----------+----------+-------------------+ CFV                     No       No                   patent by color and                                                       Doppler             +---------+---------------+---------+-----------+----------+-------------------+ FV Prox                 No       No                   patent by color and  Doppler             +---------+---------------+---------+-----------+----------+-------------------+ FV Mid                  No       No                   patent by color and                                                       Doppler             +---------+---------------+---------+-----------+----------+-------------------+ FV Distal                No       No                   patent by color and                                                       Doppler             +---------+---------------+---------+-----------+----------+-------------------+ PFV                     No       No                   patent by color and                                                       Doppler             +---------+---------------+---------+-----------+----------+-------------------+ POP      Full                                                             +---------+---------------+---------+-----------+----------+-------------------+ PTV                                                   bandages/open                                                             wounds              +---------+---------------+---------+-----------+----------+-------------------+ PERO  bandages/open                                                             wounds              +---------+---------------+---------+-----------+----------+-------------------+   +----+---------------+---------+-----------+----------+--------------+ LEFTCompressibilityPhasicitySpontaneityPropertiesThrombus Aging +----+---------------+---------+-----------+----------+--------------+ CFV Full           Yes      Yes                                 +----+---------------+---------+-----------+----------+--------------+     Summary: RIGHT: - There is no evidence of deep vein thrombosis in the lower extremity. However, portions of this examination were limited- see technologist comments above.  LEFT: - No evidence of common femoral vein obstruction.  *See table(s) above for measurements and observations. Electronically signed by Jamelle Haring on 07/24/2021 at 11:58:29 AM.    Final      DG Chest 2 View  Result Date: 07/31/2021 CLINICAL DATA:  Sepsis, pneumonia. EXAM: CHEST - 2  VIEW COMPARISON:  06/30/2021. FINDINGS: The heart size and mediastinal contours are within normal limits. There are small bilateral pleural effusions with atelectasis or infiltrate at the lung bases, best seen on lateral view. No pneumothorax is seen. No acute osseous abnormality. IMPRESSION: Small bilateral pleural effusions with atelectasis or infiltrate. Electronically Signed   By: Brett Fairy M.D.   On: 07/31/2021 21:00   US RENAL  Result Date: 08/01/2021 CLINICAL DATA:  The acute kidney injury EXAM: RENAL / URINARY TRACT ULTRASOUND COMPLETE COMPARISON:  CT abdomen pelvis 07/01/2021 FINDINGS: Right Kidney: Renal measurements: 10.7 x 5.1 x 5.3 cm = volume: 149 mL. Mild hydronephrosis. Cortical thickness within normal limits. Left Kidney: Renal measurements: 12.1 x 6.1 x 5.9 cm = volume: 231 mL. Echogenicity within normal limits. No hydronephrosis. The 2.5 cm simple cyst present unit kidney. Bladder: Unable to evaluate due to collapse configuration. Other: Incidental note of small right pleural effusion. IMPRESSION: Unchanged mild right hydronephrosis. Electronically Signed   By: Miachel Roux M.D.   On: 08/01/2021 07:49   DG C-Arm 1-60 Min-No Report  Result Date: 07/04/2021 Fluoroscopy was utilized by the requesting physician.  No radiographic interpretation.   ECHOCARDIOGRAM COMPLETE  Result Date: 07/03/2021    ECHOCARDIOGRAM REPORT   Patient Name:   YUVAAN OLANDER Date of Exam: 07/03/2021 Medical Rec #:  [id number]      Height:       74.0 in Accession #:    [id number]     Weight:       187.0 lb Date of Birth:  Oct 26, 1965      BSA:          2.112 m Patient Age:    37 years       BP:           101/65 mmHg Patient Gender: M              HR:           79 bpm. Exam Location:  [facility] Procedure: 2D Echo, Cardiac Doppler and Color Doppler Indications:    Bacteremia  History:        Patient has no prior history of Echocardiogram examinations.  Risk Factors:Hypertension and Current  Smoker. PAD, ETOH abuse.  Sonographer:    Alvino Chapel RCS Referring Phys: [place]  1. Left ventricular ejection fraction, by estimation, is 60 to 65%. The left ventricle has normal function. The left ventricle has no regional wall motion abnormalities. Left ventricular diastolic parameters were normal.  2. Right ventricular systolic function is normal. The right ventricular size is normal. Tricuspid regurgitation signal is inadequate for assessing PA pressure.  3. Right atrial size was mildly dilated.  4. The mitral valve is normal in structure. No evidence of mitral valve regurgitation. No evidence of mitral stenosis.  5. The aortic valve was not well visualized. Aortic valve regurgitation is not visualized. No aortic stenosis is present.  6. The inferior vena cava is normal in size with greater than 50% respiratory variability, suggesting right atrial pressure of 3 mmHg. Conclusion(s)/Recommendation(s): No evidence of valvular vegetations on this transthoracic echocardiogram. Would recommend a transesophageal echocardiogram to exclude infective endocarditis if clinically indicated. FINDINGS  Left Ventricle: Left ventricular ejection fraction, by estimation, is 60 to 65%. The left ventricle has normal function. The left ventricle has no regional wall motion abnormalities. The left ventricular internal cavity size was normal in size. There is  no left ventricular hypertrophy. Left ventricular diastolic parameters were normal. Normal left ventricular filling pressure. Right Ventricle: The right ventricular size is normal. No increase in right ventricular wall thickness. Right ventricular systolic function is normal. Tricuspid regurgitation signal is inadequate for assessing PA pressure. Left Atrium: Left atrial size was normal in size. Right Atrium: Right atrial size was mildly dilated. Pericardium: There is no evidence of pericardial effusion. Mitral Valve: The mitral valve is normal in  structure. No evidence of mitral valve regurgitation. No evidence of mitral valve stenosis. Tricuspid Valve: The tricuspid valve is normal in structure. Tricuspid valve regurgitation is not demonstrated. No evidence of tricuspid stenosis. Aortic Valve: The aortic valve was not well visualized. Aortic valve regurgitation is not visualized. No aortic stenosis is present. Pulmonic Valve: The pulmonic valve was normal in structure. Pulmonic valve regurgitation is not visualized. No evidence of pulmonic stenosis. Aorta: The aortic root is normal in size and structure. Venous: The inferior vena cava is normal in size with greater than 50% respiratory variability, suggesting right atrial pressure of 3 mmHg. IAS/Shunts: The interatrial septum appears to be lipomatous. No atrial level shunt detected by color flow Doppler.  LEFT VENTRICLE PLAX 2D LVIDd:         5.10 cm   Diastology LVIDs:         3.20 cm   LV e' medial:    12.60 cm/s LV PW:         0.90 cm   LV E/e' medial:  6.8 LV IVS:        0.90 cm   LV e' lateral:   15.40 cm/s LVOT diam:     2.20 cm   LV E/e' lateral: 5.6 LV SV:         82 LV SV Index:   39 LVOT Area:     3.80 cm  RIGHT VENTRICLE RV S prime:     15.50 cm/s TAPSE (M-mode): 2.8 cm LEFT ATRIUM             Index        RIGHT ATRIUM           Index LA diam:        3.00 cm 1.42 cm/m  RA Area:     21.30 cm LA Vol (A2C):   44.0 ml 20.83 ml/m  RA Volume:   72.80 ml  34.47 ml/m LA Vol (A4C):   41.3 ml 19.55 ml/m LA Biplane Vol: 46.5 ml 22.01 ml/m  AORTIC VALVE LVOT Vmax:   114.00 cm/s LVOT Vmean:  76.300 cm/s LVOT VTI:    0.216 m  AORTA Ao Root diam: 3.50 cm MITRAL VALVE MV Area (PHT): 3.42 cm    SHUNTS MV Decel Time: 222 msec    Systemic VTI:  0.22 m MV E velocity: 85.70 cm/s  Systemic Diam: 2.20 cm MV A velocity: 67.20 cm/s MV E/A ratio:  1.28 Fransico Him MD Electronically signed by Fransico Him MD Signature Date/Time: 07/03/2021/3:53:25 PM    Final    VAS [place] LOWER EXTREMITY VENOUS (DVT)  Result  Date: 07/24/2021  Lower Venous DVT Study Patient Name:  CHESKEL SILVERIO  Date of Exam:   07/23/2021 Medical Rec #: [id number]       Accession #:    [id number] Date of Birth: [birth year]       Patient Gender: M Patient Age:   38 years Exam Location:  [facility] Procedure:      VAS [place] LOWER EXTREMITY VENOUS (DVT) Referring Phys: Gwyndolyn Saxon HENSEL --------------------------------------------------------------------------------  Indications: Pitting edema.  Risk Factors: Recent vascular intervention including fasciotomy, followed by debridement of fasciotomies and wound vac placement in right groin. Limitations: Bandages, right groin wound vac, poor ultrasound/tissue interface, and significant edema. Comparison Study: No prior study on file Performing Technologist: Sharion Dove RVS  Examination Guidelines: A complete evaluation includes B-mode imaging, spectral Doppler, color Doppler, and power Doppler as needed of all accessible portions of each vessel. Bilateral testing is considered an integral part of a complete examination. Limited examinations for reoccurring indications may be performed as noted. The reflux portion of the exam is performed with the patient in reverse Trendelenburg.  +---------+---------------+---------+-----------+----------+-------------------+ RIGHT    CompressibilityPhasicitySpontaneityPropertiesThrombus Aging      +---------+---------------+---------+-----------+----------+-------------------+ CFV                     No       No                   patent by color and                                                       Doppler             +---------+---------------+---------+-----------+----------+-------------------+ FV Prox                 No       No                   patent by color and                                                       Doppler             +---------+---------------+---------+-----------+----------+-------------------+ FV Mid                   No  No                   patent by color and                                                       Doppler             +---------+---------------+---------+-----------+----------+-------------------+ FV Distal               No       No                   patent by color and                                                       Doppler             +---------+---------------+---------+-----------+----------+-------------------+ PFV                     No       No                   patent by color and                                                       Doppler             +---------+---------------+---------+-----------+----------+-------------------+ POP      Full                                                             +---------+---------------+---------+-----------+----------+-------------------+ PTV                                                   bandages/open                                                             wounds              +---------+---------------+---------+-----------+----------+-------------------+ PERO                                                  bandages/open  wounds              +---------+---------------+---------+-----------+----------+-------------------+   +----+---------------+---------+-----------+----------+--------------+ LEFTCompressibilityPhasicitySpontaneityPropertiesThrombus Aging +----+---------------+---------+-----------+----------+--------------+ CFV Full           Yes      Yes                                 +----+---------------+---------+-----------+----------+--------------+     Summary: RIGHT: - There is no evidence of deep vein thrombosis in the lower extremity. However, portions of this examination were limited- see technologist comments above.  LEFT: - No evidence of common femoral vein  obstruction.  *See table(s) above for measurements and observations. Electronically signed by Jamelle Haring on 07/24/2021 at 11:58:29 AM.    Final     Pathology:  SURGICAL PATHOLOGY  CASE: [id number]  PATIENT: Arther Abbott  Surgical Pathology Report   Clinical History: Bladder mass (crm)   FINAL MICROSCOPIC DIAGNOSIS:   A. BLADDER TUMOR, TURBT:  - Invasive high-grade urothelial carcinoma, see comment  - Muscularis propria is present and involved by carcinoma   COMMENT:   Dr. Saralyn Pilar reviewed the case and concurs with the diagnosis.  Dr. Abner Greenspan  was paged on 07/05/2021.   Assessment and Plan:  This is a 55 year old male with 1.  Metastatic urothelial carcinoma 2.  Lymphatic obstruction 3.  Hyponatremia 4.  Severe protein calorie malnutrition 5.  Normocytic anemia 6.  Leukocytosis and thrombocytosis, likely reactive 7.  AKI 8.  Mild hypercalcemia (elevated corrected calcium)  -Discussed imaging findings and biopsy results with the patient and his sister.  We discussed that he has metastatic disease.  We further discussed that this is not curable, but systemic treatment could be considered for disease control. -Would not be a candidate for cisplatin at this time given poor renal function.  However, can consider other treatment options depending on performance status upon discharge.  We further discussed that treatment would occur as an outpatient at the cancer center. -Recommend proceeding with a CT-guided biopsy of one of his lymph nodes to confirm metastatic disease.  Order placed for CT-guided biopsy by IR. -He will continue management of his wounds per vascular surgery. -His hyponatremia appears to be chronic at this time is stable.  He is asymptomatic.  Work-up and management per primary team. -Dietitian following for his for nutritional status. -He has mild hypercalcemia based on corrected calcium.  Recommend close monitoring and consider bisphosphonate therapy if  worsening. -I discussed advanced directives with the patient and his sister.  He indicates that he would like to name his sister as his healthcare power of attorney.  I have placed a chaplain consult to assist with advanced directives.  Thank you for this referral.   Mikey Bussing, DNP, AGPCNP-BC, AOCNP   Addendum  I have seen the patient, examined him. I agree with the assessment and and plan and have edited the notes.   55 yo male with PMH of HTN and recently diagnosed bladder cancer status post TURBT on July 04, 2021, which showed urothelial carcinoma.  His staging CT scan showed a diffuse adenopathy in neck, chest, abdomen and pelvis, highly suspicious for metastatic disease.  We have requested IR to biopsy his supraclavicular lymph node, and this was done this afternoon.  Results still pending.  Patient has been readmitted on July 13, 2021 for sepsis, s/p right groin and right lower extremity incisional debridement and application of wound VAC.  I reviewed his skin  findings, and discussed this is likely metastatic disease, and treatment options.  Due to his recent surgery and wound issue, he may not be a candidate for chemotherapy such as carboplatin and gemcitabine, but we can offer immunotherapy after his hospital discharge.  Patient is interested in treatment.  He lives with his friend who can also help him for transportation.  His ex-wife and sister are also part of his support system. I will reschedule his appointment partner Dr. Alen Blew this Friday if he will not be discharged before that.  I answered all his questions, will follow up if his biopsy result is back before discharge.  Truitt Merle  08/01/2021

## 2021-08-01 NOTE — Progress Notes (Addendum)
Family Medicine Teaching Service Daily Progress Note Intern Pager: [phone]  Patient name: Arthur Mcmahon Medical record number: [id number] Date of birth: [birth year] Age: 55 y.o. Gender: male  Primary Care Provider: Shary Key, DO Consultants: Vascular Surgery Code Status: Full  Pt Overview and Major Events to Date:  11/9 Admitted s/p vasc surg  Assessment and Plan:   Arthur Mcmahon is a 55 y.o. male currently admitted with vascular complications and lymphedema due to metastatic bladder cancer. He was admitted 11/9 postoperatively after right common femoral endarterectomy, lower extremity thrombectomy and fasciotomies, now with a wound VAC placement. PMHx includes metastatic Stage IVA [id number] urothelial carcinoma of the bladder, (right hydroureteronephrosis) with widely metastatic lymphadenopathy (extensive retroperitoneal, pelvic, left supraclavicular, left axilla, left para-aortic, retrocrural adenopathy), right lower extremity ischemia s/p vascular surgery above, RLE edema (caused by extrinsic compression of right common femoral vein adenopathy), normocytic anemia, chronic hyponatremia, severe protein calorie malnutrition, and tobacco use.    POD #19 S/p debridement of necrosis of surgical incisions Overnight drain output 200 cc. Volume status unchanged, with 3+ pitting edema to level of ribs on right side.  -continue head of bed at 30 degrees -thigh high compression stockings to RLE -Continue to monitor drain output -MWF wound vac changes / BID fasciotomy changes -Continue tylenol as scheduled -Continue Oxy IR 5-15 mg q4 PRN -Gabapentin 300 mg TID  Hyponatremia Na stable at 124 since 11/27 with previous 127 on 11/16. Patient receiving mIVF at 100 mL/hr.  -Continue to monitor -Continue IVF  -Consider diuretic -Nephrology following, appreciate recs  Hyperkalemia K 5.7 today (6.0, 6.3, 5.9). Patient received Lokelma x 2 yesterday and calcium gluconate. -Continue  lokelma -Continue to monitor -5 PM BMP and EKG -Daily BMP -Nephrology following, appreciate recs  UTI UA on 11/27 positive for > 50 WBC & RBC, few bacteria, and large leukocytes. Ucx collected, Bcx showed NGTD. Patient complaining of some dysuria for last couple of weeks. -Continue CFTX 1 g daily (Day 2/7) -Follow up sensitivities on Ucx   AKI  CKD IIIb Patient Cr today 2.43 today, from 2.18 yesterday. Estimated GFR 31 on BMP. Renal Ultra sound showed mild right hydronephrosis. AKI likely worsening in setting of hypovolemia. FeNa found to be 0.4%, etiology is likely pre-renal.  -Consider diuretic -Nephrology following, appreciate recs   Metastic urothelial carcinoma of the bladder Patient cancer causing significant lymphedema. Oncology to see patient while inpatient today, to stage and determine potential therapies. -Oncology appointment 12/2 -Urology appointment 12/15   Constipation Patient had two bowel movements this morning. -Continue MiraLAX twice daily -Continue senna daily -Continue Colace twice daily as needed   Pressure ulcer of coccygeal region, stage 2  Nursing using skin care protocols for pressure ulcers  Protein-calorie malnutrition, severe Continue nutrition's recommendations of Ensure twice a day   Peripheral artery disease -Continue atorvastatin 40 mg daily  Other problems chronic and stable   FEN/GI: regular diet PPx: Eliquis Dispo:Home with home health  pending clinical improvement .   Subjective:  Patient had two bowel movements today, and complains of leg pain rated 8/10.   Objective: Temp:  [97.7 F (36.5 C)-98.4 F (36.9 C)] 97.8 F (36.6 C) (11/28 0339) Pulse Rate:  [74-122] 84 (11/28 0339) Resp:  [11-18] 18 (11/28 0339) BP: (107-120)/(71-89) 120/73 (11/28 0339) SpO2:  [99 %-100 %] 100 % (11/28 0339) Weight:  [93 kg] 93 kg (11/28 0500) Physical Exam: General: Frail, white male, NAD, comfortable in bed Cardiovascular: RRR,  NRMG Respiratory: CTABL Abdomen: Soft, NTTP,  some abdominal edema Extremities: 3+ Pitting edema in right leg, up to right ribs, and into testicles. Able to wiggle toes bilaterally  Laboratory: Recent Labs  Lab 07/31/21 1822 08/01/21 0452  WBC 18.7* 14.9*  HGB 9.2* 8.2*  HCT 28.7* 25.5*  PLT 534* 528*   Recent Labs  Lab 07/31/21 0830 07/31/21 1822 07/31/21 1840 07/31/21 2123  NA 123* 124*  --  124*  K 5.9* 6.3*  --  6.0*  CL 89* 90*  --  90*  CO2 25 21*  --  23  BUN 49* 57*  --  59*  CREATININE 2.20* 2.24*  --  2.18*  CALCIUM 9.3 9.4  --  9.4  PROT 5.9*  --  6.4* 6.1*  BILITOT 1.0  --  0.6 0.4  ALKPHOS 84  --  91 82  ALT 8  --  9 9  AST 18  --  26 19  GLUCOSE 83 88  --  80      Imaging/Diagnostic Tests:   Holley Bouche, MD 08/01/2021, 6:04 AM PGY-1, [place] Intern pager: [phone], text pages welcome

## 2021-08-01 NOTE — Consult Note (Addendum)
IR was requested for image guided biopsy of a LAN.   Case was reviewed and approved for US guided LAN bx with single sedating agent and local anesthesia by Dr. Maryelizabeth Kaufmann.  Patient seen in his room, wife at bedside.   Physical Exam Vitals and nursing note reviewed.  Constitutional:      General: Patient is not in acute distress.    Appearance: Normal appearance. Patient appears to be chronically ill.  HENT:     Head: Normocephalic and atraumatic.    Cardiovascular:     Rate and Rhythm: Normal rate and regular rhythm.     Heart sounds: Normal heart sounds.  Pulmonary:     Effort: Pulmonary effort is normal.     Breath sounds: expiratory wheezing on right upper and lower lobs, left upper lobe.  Patient is ex smoker.  Abdominal:     General: Abdomen is flat. Bowel sounds are normal.     Palpations: Abdomen is soft.  Musculoskeletal:     Cervical back: Neck supple.  Skin:    General: Skin is warm and dry.     Coloration: Skin is not jaundiced or pale.  Neurological:     Mental Status: Patient is alert and oriented to person, place, and time.  Psychiatric:        Mood and Affect: Mood normal.        Behavior: Behavior normal.        Judgment: Judgment normal.   VSS  On Eliquis, no need for D/C   Risks and benefits of LAN Bx was discussed with the patient and/or patient's family including, but not limited to bleeding, infection, damage to adjacent structures or low yield requiring additional tests.  All of the questions were answered and there is agreement to proceed.  The procedure is tentatively scheduled for today pending IR schedule.   Consent signed and in chart. Please call IR for questions and concerns.   Kaytelyn Glore H Lamoyne Hessel PA-C 08/01/2021 1:40 PM

## 2021-08-01 NOTE — Consult Note (Signed)
WOC Nurse Consult Note: Patient receiving care in [facility] 4E01 This incision has closed up tremendously since initiation of the NPWT. Unless needed for lymphatic drainage vac therapy can be discontinued if VVS agrees.  Reason for Consult: assist bedside RN with vac dressing change Wound type: Surgical right groin wound with lymphatic leak Pressure Injury POA: NA Measurement: 5.5 cm x 0.4 cm x 0.4 cm Wound bed: red granulation tissue Drainage (amount, consistency, odor) lymphatic drainage. Canister half full. Changed today.  Periwound: hardened, edematous Dressing procedure/placement/frequency: 1 piece of black foam removed. 1 small piece of black foam replaced, drape applied, immediate suction obtained at 125 mmHg.   Small black foam dressing kit [name] # [phone])  [name]. [name], MSN, RN, [name], [name], [facility] Wound Treatment Associate Pager [phone]

## 2021-08-01 NOTE — Procedures (Signed)
Vascular and Interventional Radiology Procedure Note  Patient: Arthur Mcmahon DOB: 15-Sep-1965 Medical Record Number: [id number] Note Date/Time: 08/01/21 3:17 PM   Performing Physician: Michaelle Birks, MD Assistant(s): None  Diagnosis: Hx Urothelial CA  Procedure:  LEFT SUPRACLAVICULAR LYMPH NODE BIOPSY  Anesthesia: Local Anesthetic Complications: None Estimated Blood Loss: Minimal Specimens: Sent for Pathology  Findings:  Successful Ultrasound-guided biopsy of LEFT supraclavicular LNs. A total of 3x 18G core Bx samples were obtained. Hemostasis of the tract was achieved using Manual Pressure.  Plan: Bed rest for 1 hours.  See detailed procedure note with images in PACS. The patient tolerated the procedure well without incident or complication and was returned to Floor Bed in stable condition.    Michaelle Birks, MD [facility]   Pager. [place]

## 2021-08-01 NOTE — Progress Notes (Addendum)
  Progress Note    08/01/2021 7:21 AM 19 Days Post-Op  Subjective:  no major complaints   Vitals:   07/31/21 2311 08/01/21 0339  BP: 112/76 120/73  Pulse: 92 84  Resp: 11 18  Temp: 97.9 F (36.6 C) 97.8 F (36.6 C)  SpO2: 100% 100%   Physical Exam: Cardiac:  regular Lungs:  non labored Incisions:  right leg dressings just changed, clean and dry. Right groin with wound vac to suction. 150 cc serosanguinous output Extremities:  RLE remains very edematous. Ted hose to LLE. R foot cool and mottled. Doppler PT/pero signal Abdomen:  soft, non distended Neurologic: alert and oriented  CBC    Component Value Date/Time   WBC 14.9 (H) 08/01/2021 0452   RBC 2.90 (L) 08/01/2021 0452   HGB 8.2 (L) 08/01/2021 0452   HCT 25.5 (L) 08/01/2021 0452   PLT 528 (H) 08/01/2021 0452   MCV 87.9 08/01/2021 0452   MCH 28.3 08/01/2021 0452   MCHC 32.2 08/01/2021 0452   RDW 15.3 08/01/2021 0452   LYMPHSABS 0.7 07/21/2021 0629   MONOABS 0.8 07/21/2021 0629   EOSABS 0.2 07/21/2021 0629   BASOSABS 0.0 07/21/2021 0629    BMET    Component Value Date/Time   NA 124 (L) 08/01/2021 0452   K 5.7 (H) 08/01/2021 0452   CL 93 (L) 08/01/2021 0452   CO2 23 08/01/2021 0452   GLUCOSE 77 08/01/2021 0452   BUN 61 (H) 08/01/2021 0452   CREATININE 2.43 (H) 08/01/2021 0452   CALCIUM 9.4 08/01/2021 0452   GFRNONAA 31 (L) 08/01/2021 0452   GFRAA >60 12/01/2019 2208    INR    Component Value Date/Time   INR 1.0 06/18/2021 1550     Intake/Output Summary (Last 24 hours) at 08/01/2021 0721 Last data filed at 08/01/2021 0600 Gross per 24 hour  Intake 2191.33 ml  Output 750 ml  Net 1441.33 ml     Assessment/Plan:  55 y.o. male is s/p sharp excisional debridement of right groin and bilateral right lower leg fasciotomy incisions. Brisk multiphasic right PT Doppler signal. Wound VAC placed  19 Days Post-Op   RLE remains edematous with mottling. Continues to have PT/ pero doppler signals.   Continue daily Dakins dressing changes to RLE, MWF right groin wound VAC changes 150 cc serosanguinous output from VAC Continue Eliquis Continue TED hose Ambulate with assistance Scr continues to rise/ Oliguric Hyperkalemic Medical management per Primary team   DVT prophylaxis:  Apixaban   Karoline Caldwell, PA-C [facility] [phone] 08/01/2021 7:21 AM  I have seen and evaluated the patient. I agree with the PA note as documented above.  Continue VAC therapy to right groin wound.  This was changed this morning.  Output decreased to 200 mL over the last 24 hours.  Fasciotomies remain clean with wet-to-dry with Dakin's to right lower extremity.  He has a very brisk PT signal at the ankle.  Now has ischemic changes to his toes in the right lower extremity.  No further vascular intervention from my standpoint.  I am not sure he understands the gravity of his situation with metastatic bladder cancer.  Severely malnourished.  Severe electrolyte abnormalities.     [name], MD [facility] [place] Office: [phone]

## 2021-08-01 NOTE — Progress Notes (Signed)
Occupational Therapy Treatment Patient Details Name: Arthur Mcmahon MRN: [id number] DOB: February 25, 1966 Today's Date: 08/01/2021   History of present illness Pt is a 55 y.o. male admitted on 07/13/21 for necrosis and lymphatic leakage of RLE.  Debridement of fasciotomy incisions and wound VAC placement of groin incision on 11/09.  PMH includes HTN, PVD, anemia, s/p right thrombectomy and 4 compartment fasciotomies on 06/19/21.   OT comments  Patient continues to make progress towards goals in skilled OT session. Patient's session encompassed bed mobility to complete peri-care as patient was declining OOB due to increased pain (patient given pain meds during session). Pt has recently developed incontinence in this hospital stay and was unaware of bowel movement therefore required total A to be cleaned up at bed level. Patient declining placement on bed pan or transferring to [facility] at end of session. Therapy will continue to follow.    Recommendations for follow up therapy are one component of a multi-disciplinary discharge planning process, led by the attending physician.  Recommendations may be updated based on patient status, additional functional criteria and insurance authorization.    Follow Up Recommendations  Home health OT    Assistance Recommended at Discharge Intermittent Supervision/Assistance  Equipment Recommendations  None recommended by OT    Recommendations for Other Services      Precautions / Restrictions Precautions Precautions: Fall Precaution Comments: wound vac right groin area Restrictions Weight Bearing Restrictions: No RLE Weight Bearing: Weight bearing as tolerated       Mobility Bed Mobility Overal bed mobility: Needs Assistance Bed Mobility: Rolling Rolling: Modified independent (Device/Increase time)         General bed mobility comments: able to roll with extra time due to pain, completed for peri-care purposes    Transfers                          Balance                                           ADL either performed or assessed with clinical judgement   ADL Overall ADL's : Needs assistance/impaired Eating/Feeding: Independent;Bed level                         Toilet Transfer Details (indicate cue type and reason): bed level, unaware if he had soiled himself, able to roll without assist Toileting- Clothing Manipulation and Hygiene: Bed level;Cueing for sequencing Toileting - Clothing Manipulation Details (indicate cue type and reason): bed level, unaware if he had soiled himself, able to roll without assist       General ADL Comments: total A to peri care, unwilling to complete further ADLs due to increased pain in session    Extremity/Trunk Assessment              Vision       Perception     Praxis      Cognition Arousal/Alertness: Awake/alert Behavior During Therapy: Flat affect Overall Cognitive Status: Within Functional Limits for tasks assessed                                 General Comments: limited by pain          Exercises     Shoulder Instructions  General Comments      Pertinent Vitals/ Pain       Pain Assessment: Faces Faces Pain Scale: Hurts even more Pain Location: RLE, groin, and abdominal Pain Descriptors / Indicators: Aching;Discomfort;Grimacing;Pressure Pain Intervention(s): Limited activity within patient's tolerance;Monitored during session;Repositioned;RN gave pain meds during session  Home Living                                          Prior Functioning/Environment              Frequency  Min 2X/week        Progress Toward Goals  OT Goals(current goals can now be found in the care plan section)  Progress towards OT goals: Progressing toward goals  Acute Rehab OT Goals Patient Stated Goal: to have less pain OT Goal Formulation: With patient Time For Goal Achievement:  08/10/21 Potential to Achieve Goals: Good  Plan Discharge plan remains appropriate    Co-evaluation                 AM-PAC OT "6 Clicks" Daily Activity     Outcome Measure   Help from another person eating meals?: None Help from another person taking care of personal grooming?: None Help from another person toileting, which includes using toliet, bedpan, or urinal?: A Lot Help from another person bathing (including washing, rinsing, drying)?: A Lot Help from another person to put on and taking off regular upper body clothing?: A Little Help from another person to put on and taking off regular lower body clothing?: A Lot 6 Click Score: 17    End of Session    OT Visit Diagnosis: Unsteadiness on feet (R26.81);Other abnormalities of gait and mobility (R26.89);Pain Pain - Right/Left: Right Pain - part of body: Leg   Activity Tolerance Patient limited by fatigue;Patient limited by pain   Patient Left in bed;with call bell/phone within reach   Nurse Communication Mobility status        Time: 6203-5597 OT Time Calculation (min): 17 min  Charges: OT General Charges $OT Visit: 1 Visit OT Treatments $Self Care/Home Management : 8-22 mins  Corinne Ports E. June Vacha, COTA/L Acute Rehabilitation Services [place] 08/01/2021, 11:09 AM

## 2021-08-02 DIAGNOSIS — L89152 Pressure ulcer of sacral region, stage 2: Secondary | ICD-10-CM

## 2021-08-02 LAB — RENAL FUNCTION PANEL
Albumin: 2.6 g/dL — ABNORMAL LOW (ref 3.5–5.0)
Anion gap: 8 (ref 5–15)
BUN: 65 mg/dL — ABNORMAL HIGH (ref 6–20)
CO2: 24 mmol/L (ref 22–32)
Calcium: 9.5 mg/dL (ref 8.9–10.3)
Chloride: 90 mmol/L — ABNORMAL LOW (ref 98–111)
Creatinine, Ser: 2.45 mg/dL — ABNORMAL HIGH (ref 0.61–1.24)
GFR, Estimated: 31 mL/min — ABNORMAL LOW (ref >60)
Glucose, Bld: 90 mg/dL (ref 70–99)
Phosphorus: 5.5 mg/dL — ABNORMAL HIGH (ref 2.5–4.6)
Potassium: 5.1 mmol/L (ref 3.5–5.1)
Sodium: 122 mmol/L — ABNORMAL LOW (ref 135–145)

## 2021-08-02 LAB — BASIC METABOLIC PANEL WITH GFR
Anion gap: 13 (ref 5–15)
BUN: 57 mg/dL — ABNORMAL HIGH (ref 6–20)
CO2: 21 mmol/L — ABNORMAL LOW (ref 22–32)
Calcium: 9.4 mg/dL (ref 8.9–10.3)
Chloride: 90 mmol/L — ABNORMAL LOW (ref 98–111)
Creatinine, Ser: 2.24 mg/dL — ABNORMAL HIGH (ref 0.61–1.24)
GFR, Estimated: 34 mL/min — ABNORMAL LOW
Glucose, Bld: 88 mg/dL (ref 70–99)
Potassium: 6.3 mmol/L (ref 3.5–5.1)
Sodium: 124 mmol/L — ABNORMAL LOW (ref 135–145)

## 2021-08-02 LAB — CBC
HCT: 23.4 % — ABNORMAL LOW (ref 39.0–52.0)
Hemoglobin: 7.4 g/dL — ABNORMAL LOW (ref 13.0–17.0)
MCH: 27.9 pg (ref 26.0–34.0)
MCHC: 31.6 g/dL (ref 30.0–36.0)
MCV: 88.3 fL (ref 80.0–100.0)
Platelets: 523 10*3/uL — ABNORMAL HIGH (ref 150–400)
RBC: 2.65 MIL/uL — ABNORMAL LOW (ref 4.22–5.81)
RDW: 15.6 % — ABNORMAL HIGH (ref 11.5–15.5)
WBC: 12.3 10*3/uL — ABNORMAL HIGH (ref 4.0–10.5)
nRBC: 0 % (ref 0.0–0.2)

## 2021-08-02 LAB — HEMOGLOBIN AND HEMATOCRIT, BLOOD
HCT: 28.1 % — ABNORMAL LOW (ref 39.0–52.0)
Hemoglobin: 9 g/dL — ABNORMAL LOW (ref 13.0–17.0)

## 2021-08-02 LAB — CK: Total CK: 16 U/L — ABNORMAL LOW (ref 49–397)

## 2021-08-02 LAB — PROTEIN / CREATININE RATIO, URINE
Creatinine, Urine: 88.29 mg/dL
Protein Creatinine Ratio: 0.87 mg/mg{Cre} — ABNORMAL HIGH (ref 0.00–0.15)
Total Protein, Urine: 77 mg/dL

## 2021-08-02 LAB — SURGICAL PATHOLOGY

## 2021-08-02 LAB — CALCIUM, IONIZED: Calcium, Ionized, Serum: 5.6 mg/dL (ref 4.5–5.6)

## 2021-08-02 LAB — PREPARE RBC (CROSSMATCH)

## 2021-08-02 LAB — HAPTOGLOBIN: Haptoglobin: 377 mg/dL — ABNORMAL HIGH (ref 29–370)

## 2021-08-02 LAB — TSH: TSH: 16.331 u[IU]/mL — ABNORMAL HIGH (ref 0.350–4.500)

## 2021-08-02 LAB — OSMOLALITY: Osmolality: 286 mOsm/kg (ref 275–295)

## 2021-08-02 MED ORDER — SODIUM ZIRCONIUM CYCLOSILICATE 10 G PO PACK: 10.0000 g | PACK | Freq: Once | ORAL | Status: DC

## 2021-08-02 MED ORDER — SODIUM CHLORIDE 1 G PO TABS
ORAL_TABLET | ORAL | Status: AC
  Filled 2021-08-02: qty 1

## 2021-08-02 MED ORDER — SODIUM CHLORIDE 1 G PO TABS
1.0000 g | ORAL_TABLET | Freq: Three times a day (TID) | ORAL | Status: DC
  Administered 2021-08-02 – 2021-08-06 (×14): 1 g via ORAL
  Filled 2021-08-02 (×12): qty 1

## 2021-08-02 MED ORDER — SODIUM CHLORIDE 0.9% IV SOLUTION: Freq: Once | INTRAVENOUS | Status: AC

## 2021-08-02 MED ORDER — LEVOTHYROXINE SODIUM 25 MCG PO TABS
ORAL_TABLET | ORAL | Status: AC
  Filled 2021-08-02: qty 1

## 2021-08-02 MED ORDER — LEVOTHYROXINE SODIUM 25 MCG PO TABS
25.0000 ug | ORAL_TABLET | Freq: Every day | ORAL | Status: DC
  Administered 2021-08-02 – 2021-08-08 (×7): 25 ug via ORAL
  Filled 2021-08-02 (×7): qty 1

## 2021-08-02 NOTE — Progress Notes (Signed)
Spoke with Dr. Carlis Abbott of vascular surgery regarding Eliquis continuance while Arthur Mcmahon hemoglobin is drifting down and requiring transfusion. Dr. Carlis Abbott believes this patient is at an extremely high risk of re-thrombosis off of Eliquis. At this time, eliquis will stay on unless we have a definite source of bleeding.   Arthur Essex, MD PGY-2, [facility] pager [phone]

## 2021-08-02 NOTE — Progress Notes (Signed)
Family Medicine Teaching Service Daily Progress Note Intern Pager: [phone]  Patient name: Arthur Mcmahon Medical record number: [id number] Date of birth: 06-05-1966 Age: 55 y.o. Gender: male  Primary Care Provider: Shary Key, DO Consultants: Vascular Surgery, Nephrology, Oncology Code Status: Full  Pt Overview and Major Events to Date:  11/9 - Admitted s/p vasc surg 11/28 - US guided left supraclavicular lymph node biopsy - IR  Assessment and Plan:  Arthur Mcmahon is a 55 y.o. male currently admitted with vascular complications and lymphedema due to metastatic bladder cancer. He was admitted 11/9 postoperatively after right common femoral endarterectomy, lower extremity thrombectomy and fasciotomies, now with a wound VAC placement. 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    POD #20 S/p debridement of necrosis of surgical incisions Patient post op day 20, with wound vac drainage of 500 cc yesterday. Patient continues to have 3+ pitting edema to level of ribs on right side. WOC reports groin wound closed up tremendously, wound vac no longer necessary if VVS signs off.  -Continue head of bed at 30 degrees -Thigh high compression stockings to RLE -Continue to monitor drain output -MWF wound vac changes, BID fasciotomy wound changes -Continue tylenol 1 g q6hr -Continue Oxy IR 5-15 mg q4hr prn -Gabapentin 300 mg TID  Hypothyroidism TSH 07/01/21 was elevated at 13.445.  -Repeat TSH -Synthroid 25 mcg daily  Hgb Hgb fell from 8.2 to  7.4 this morning. Per nursing, patient has had no reports of blood loss in urine or stool. Foley catheter draining with yellow urine. -Transfuse 1 unit PRBC   Hyponatremia  Hyperkalemia  Hypercalcemia  Hyperphosphatemia Na fell to 122, K stable at 5.1, Ca corrected to 10.6, Phos 5.5 Nephrology noted elevated TSH at 13.445 on 07/01/21, suspected that it may be contributing to hyponatremia. Urine Na, K, and osmolality were wnl. -Nephrology following, appreciate recs -Continue Lokelma -Daily BMP -Continue mIVF at 75 mL/hr  UTI UA on 11/28 with large Hgb and Leukocytes, 100 protein, few bacteria, with > 50 WBC trending down to 12.3 from 14.9. Ucx never made it to lab, is being redrawn off of yesterday UA, for today.  -Continue Ceftriaxone 1g daily (day 3/7) -Follow up sensitivities on UCx   AKI  CKD IIIb Protein Cr ratio 0.87, CK low at 16, Cr stable today at 2.45. Per nephrology, AKI likely ATN due to intravascular volume depletion.  -Continue mIVF -Albumin 25% 50 g   Metastic urothelial carcinoma of the bladder Oncology saw patient yesterday and discussed cancer and potential therapies once outpatient. Patient received US guided left supraclavicular lymph node biopsy. -Follow up lymph node biopsy -Oncology appointment 08/05/21 -Urology appointment 08/18/21  Constipation Five unmeasured stool occurences recorded yesterday, nursing reports patient had multiple bouts of loose stools. This is likely 2/2 lowkelma and bowel regimen.  -Hold MiraLAX  -Hold Senna  -Continue colace BID prn  Pressure ulcer of coccygeal region, stage 2  Nursing using skin care protocols for pressure ulcers  Protein-calorie malnutrition, severe -Continue nutrition's recommendation of Ensure BID  Peripheral artery disease -Continue atorvastatin 40 mg daily  FEN/GI: Regular Diet PPx: Eliquis Dispo:Home with home health  pending clinical improvement . Marland Kitchen   Subjective:  Patient laying comfortably in  bed, eating breakfast. Complains of night nursing being unhelpful for getting meds and drinks  Objective: Temp:  [97.6 F (36.4 C)-98.3 F (36.8 C)] 98.3 F (36.8 C) (11/29 0454) Pulse Rate:  [72-85] 77 (11/29 0454) Resp:  [12-18] 13 (11/29 0454) BP: (99-116)/(62-77) 101/67 (11/29 0454) SpO2:  [96 %-100 %] 100 % (11/29 0454) Weight:  [90.7 kg] 90.7 kg (11/29 0454) Physical Exam: General: Frail, white male, NASD Cardiovascular: RRR, NRMG Respiratory: CTABL Abdomen: Soft, NTTP, some 2+ pitting edema located to level of ribs Extremities: 3+ pitting edema in right leg, extending into testicles and abdomen. No edema in left leg.   Laboratory: Recent Labs  Lab 07/31/21 1822 08/01/21 0452 08/02/21 0446  WBC 18.7* 14.9* 12.3*  HGB 9.2* 8.2* 7.4*  HCT 28.7* 25.5* 23.4*  PLT 534* 528* 523*   Recent Labs  Lab 07/31/21 1840 07/31/21 2123 08/01/21 0452 08/01/21 1619  NA  --  124* 124* 124*  K  --  6.0* 5.7* 5.4*  CL  --  90* 93* 89*  CO2  --  23 23 26   BUN  --  59* 61* 64*  CREATININE  --  2.18* 2.43* 2.49*  CALCIUM  --  9.4 9.4 9.4  PROT 6.4* 6.1* 5.9*  --   BILITOT 0.6 0.4 0.5  --   ALKPHOS 91 82 81  --   ALT 9 9 9   --   AST 26 19 19   --   GLUCOSE  --  80 77 98      Imaging/Diagnostic Tests:   Holley Bouche, MD 08/02/2021, 5:37 AM PGY-1, [place] Intern pager: [phone], text pages welcome

## 2021-08-02 NOTE — Progress Notes (Signed)
Progress note - Code Status  FPTS Attending Physician Dr. Erin Hearing spoke with patient and brother about code status and current medical prognosis. Patient decided that he did not want to be intubated and that he would like one round of defibrillation. He will be made a partial code with DNI.   Holley Bouche, MD FPTS - PGY-1

## 2021-08-02 NOTE — Progress Notes (Signed)
FPTS Brief Progress Note  S: In to see patient. RN at the bedside. Patient reports feeling "alright, I guess". Had a left supraclavicular lymph node biopsy today.   O: BP 104/65 (BP Location: Right Arm)   Pulse 76   Temp 98.1 F (36.7 C) (Oral)   Resp 12   Ht 6\' 2"  (1.88 m)   Wt 93 kg   SpO2 100%   BMI 26.32 kg/m   Gen: Chronically ill appearing, frail caucasian male sitting up in bed, NAD Resp: normal work of breathing Cardiac: sinus rhythm on tele   A/P: Non-oliguric AKI  Chronic hyponatremia  Appreciate Nephrology's assistance. Hyperkalemia appears improved. Still hyponatremic and on IVF. Further testing pending.   Metastatic urothelial carcinoma of bladder Supraclavicular lymph node biopsy today. Results are pending. Appreciate oncology and IR's assistance and care of this patient.   Continue plan per day team progress note without additional changes at this time.   - Orders reviewed. Labs for AM ordered, which was adjusted as needed.    [name], DO 08/02/2021, 12:43 AM PGY-2, [facility] Night Resident  Please page [phone] with questions.

## 2021-08-02 NOTE — Progress Notes (Signed)
[facility] NEPHROLOGY PROGRESS NOTE  Assessment/ Plan: Pt is a 55 y.o. yo male  with past medical history of hypertension, who was admitted with vascular complications and lymphedema due to metastatic bladder cancer, seen as a consultation for the evaluation of acute kidney injury.  #Acute kidney injury, nonoliguric: Likely ischemic ATN due to intravascular volume depletion/sepsis/surgery with hemodynamic changes +/- right hydronephrosis.  CK level not elevated. Urinalysis with UTI and minimal protein.  The recent ultrasound showed improved right hydronephrosis.  No recent IV contrast or renal insult identified.  Treated with IV albumin and fluid with stable creatinine level today.  Urine output improved. Continue strict ins and outs and daily lab.  No need for dialysis at this time.  Continue to monitor.   #Hyponatremia: He looks euvolemic on exam and asymptomatic.  TSH level elevated consistent with hypothyroidism.  This can cause hyponatremia therefore recommend to start thyroid hormone.  Discussed with the primary team.  Cortisol level acceptable.  Urine sodium is low.  Plan to continue IV fluid, will lower IV rate and add salt tablet to increase solutes. He may also have high ADH state in the setting of malignancy.    #Hyperkalemia: Continue Lokelma and monitor lab.   #Metastatic urothelial carcinoma of the bladder: s/p TURBT on 10/31. Oncology is following and he underwent IR guided lymph node biopsy today.  Also seen by urologist for right hydronephrosis.   #Severe protein calorie malnutrition: Continue Ensure and follow dietitian.   #Sepsis of right groin: s/p debridement of necrosis of surgical incision site: Currently has right groin wound VAC.  Wet-to-dry dressing for the fasciotomies and right lower extremity.   Subjective: Seen and examined.  He had urine output around 1.3 L.  Denies nausea, vomiting, chest pain, shortness of breath. Objective Vital signs in last 24  hours: Vitals:   08/01/21 1951 08/01/21 2346 08/02/21 0454 08/02/21 0725  BP: 99/62 104/65 101/67 112/73  Pulse: 72 76 77 86  Resp: 18 12 13 18   Temp: 98 F (36.7 C) 98.1 F (36.7 C) 98.3 F (36.8 C) 97.8 F (36.6 C)  TempSrc: Oral Oral Oral Oral  SpO2: 96% 100% 100% 100%  Weight:   90.7 kg   Height:       Weight change: -2.268 kg  Intake/Output Summary (Last 24 hours) at 08/02/2021 0920 Last data filed at 08/02/2021 4401 Gross per 24 hour  Intake 2370 ml  Output 1775 ml  Net 595 ml       Labs: Basic Metabolic Panel: Recent Labs  Lab 08/01/21 0452 08/01/21 1619 08/02/21 0446  NA 124* 124* 122*  K 5.7* 5.4* 5.1  CL 93* 89* 90*  CO2 23 26 24   GLUCOSE 77 98 90  BUN 61* 64* 65*  CREATININE 2.43* 2.49* 2.45*  CALCIUM 9.4 9.4 9.5  PHOS  --   --  5.5*   Liver Function Tests: Recent Labs  Lab 07/31/21 1840 07/31/21 2123 08/01/21 0452 08/02/21 0446  AST 26 19 19   --   ALT 9 9 9   --   ALKPHOS 91 82 81  --   BILITOT 0.6 0.4 0.5  --   PROT 6.4* 6.1* 5.9*  --   ALBUMIN 1.8* 1.6* 1.6* 2.6*   No results for input(s): LIPASE, AMYLASE in the last 168 hours. No results for input(s): AMMONIA in the last 168 hours. CBC: Recent Labs  Lab 07/31/21 1822 08/01/21 0452 08/02/21 0446  WBC 18.7* 14.9* 12.3*  HGB 9.2* 8.2* 7.4*  HCT 28.7* 25.5* 23.4*  MCV 88.0 87.9 88.3  PLT 534* 528* 523*   Cardiac Enzymes: Recent Labs  Lab 08/02/21 0446  CKTOTAL 16*   CBG: No results for input(s): GLUCAP in the last 168 hours.  Iron Studies: No results for input(s): IRON, TIBC, TRANSFERRIN, FERRITIN in the last 72 hours. Studies/Results: DG Chest 2 View  Result Date: 07/31/2021 CLINICAL DATA:  Sepsis, pneumonia. EXAM: CHEST - 2 VIEW COMPARISON:  06/30/2021. FINDINGS: The heart size and mediastinal contours are within normal limits. There are small bilateral pleural effusions with atelectasis or infiltrate at the lung bases, best seen on lateral view. No pneumothorax is  seen. No acute osseous abnormality. IMPRESSION: Small bilateral pleural effusions with atelectasis or infiltrate. Electronically Signed   By: Brett Fairy M.D.   On: 07/31/2021 21:00   US RENAL  Result Date: 08/01/2021 CLINICAL DATA:  The acute kidney injury EXAM: RENAL / URINARY TRACT ULTRASOUND COMPLETE COMPARISON:  CT abdomen pelvis 07/01/2021 FINDINGS: Right Kidney: Renal measurements: 10.7 x 5.1 x 5.3 cm = volume: 149 mL. Mild hydronephrosis. Cortical thickness within normal limits. Left Kidney: Renal measurements: 12.1 x 6.1 x 5.9 cm = volume: 231 mL. Echogenicity within normal limits. No hydronephrosis. The 2.5 cm simple cyst present unit kidney. Bladder: Unable to evaluate due to collapse configuration. Other: Incidental note of small right pleural effusion. IMPRESSION: Unchanged mild right hydronephrosis. Electronically Signed   By: Miachel Roux M.D.   On: 08/01/2021 07:49   [place] CORE BIOPSY (LYMPH NODES)  Result Date: 08/01/2021 INDICATION: History of urothelial cancer.  Enlarged supraclavicular lymph nodes. EXAM: ULTRASOUND-GUIDED LEFT SUPRACLAVICULAR LYMPH NODE BIOPSY COMPARISON:  CT chest, 07/02/2019. MEDICATIONS: None ANESTHESIA/SEDATION: Local anesthetic was administered. COMPLICATIONS: None immediate. TECHNIQUE: Informed written consent was obtained from the patient after a discussion of the risks, benefits and alternatives to treatment. Questions regarding the procedure were encouraged and answered. Initial ultrasound scanning demonstrated pathologically-enlarged LEFT supraclavicular lymph nodes. An ultrasound image was saved for documentation purposes. The procedure was planned. A timeout was performed prior to the initiation of the procedure. The operative was prepped and draped in the usual sterile fashion, and a sterile drape was applied covering the operative field. A timeout was performed prior to the initiation of the procedure. Local anesthesia was provided with 1% lidocaine. Under  direct ultrasound guidance, an 18 gauge core needle device was utilized to obtain to obtain 3 core needle biopsies of the LEFT supraclavicular lymph nodes. The samples were placed in saline and submitted to pathology. The needle was removed and hemostasis was achieved with manual compression. Post procedure scan was negative for significant hematoma. A dressing was placed. The patient tolerated the procedure well without immediate postprocedural complication. IMPRESSION: Successful ultrasound guided biopsy of LEFT supraclavicular lymph nodes. Michaelle Birks, MD [facility] Radiology Electronically Signed   By: Michaelle Birks M.D.   On: 08/01/2021 16:11    Medications: Infusions:  sodium chloride 100 mL/hr at 08/02/21 0649   cefTRIAXone (ROCEPHIN)  IV 1 g (08/01/21 2025)    Scheduled Medications:  acetaminophen  1,000 mg Oral Q6H   apixaban  5 mg Oral BID   atorvastatin  40 mg Oral Daily   Chlorhexidine Gluconate Cloth  6 each Topical Daily   feeding supplement  237 mL Oral BID BM   gabapentin  300 mg Oral TID   multivitamin with minerals  1 tablet Oral Daily   nutrition supplement (JUVEN)  1 packet Oral BID BM  polyethylene glycol  34 g Oral BID   senna  2 tablet Oral Daily   sodium chloride flush  10-40 mL Intracatheter Q12H   sodium zirconium cyclosilicate  10 g Oral Daily    have reviewed scheduled and prn medications.  Physical Exam: General:NAD, comfortable Heart:RRR, s1s2 nl Lungs:clear b/l, no crackle Abdomen:soft, Non-tender, non-distended Extremities:Right leg edematous, dressing applied and has groin wound VAC.  No edema in left leg Neurology: Alert, awake and following commands  Gavriela Cashin Tanna Furry 08/02/2021,9:20 AM  LOS: 20 days

## 2021-08-02 NOTE — Progress Notes (Addendum)
[facility]  Subjective  - resting well over night   Objective 101/67 77 98.3 F (36.8 C) (Oral) 13 100%  Intake/Output Summary (Last 24 hours) at 08/02/2021 0814 Last data filed at 08/02/2021 4818 Gross per 24 hour  Intake 2610 ml  Output 1925 ml  Net 685 ml    PT signal right LE Right LE dressing changed by RN this am reported beefy red muscle GT toe ischemic changes have progressed, 2nd toe and 5th toe Lungs non labored breathing Heart RRR  Assessment/Planning: 55 y.o. male is s/p sharp excisional debridement of right groin and bilateral right lower leg fasciotomy incisions. Brisk multiphasic right PT Doppler signal. Wound VAC placed  20 Days Post-Op  Ischemic changes to the right GT, second toe and 5th toe Right calf fasciotomy healing well with in flow doppler signal PT Vac OP 600 total previous day 200 cc.  Vac change tomorrow Cr 2.45 urine Op >2000 cc Leukocytosis 12.3  DVT prophylaxis:  Apixaban    [name] 08/02/2021 7:13 AM --  Laboratory Lab Results: Recent Labs    08/01/21 0452 08/02/21 0446  WBC 14.9* 12.3*  HGB 8.2* 7.4*  HCT 25.5* 23.4*  PLT 528* 523*   BMET Recent Labs    08/01/21 1619 08/02/21 0446  NA 124* 122*  K 5.4* 5.1  CL 89* 90*  CO2 26 24  GLUCOSE 98 90  BUN 64* 65*  CREATININE 2.49* 2.45*  CALCIUM 9.4 9.5    COAG Lab Results  Component Value Date   INR 1.0 06/18/2021   INR 1.1 04/11/2021   INR 0.9 03/22/2021   No results found for: PTT  I have seen and evaluated the patient. I agree with the PA note as documented above.  Right groin VAC changed yesterday.  Output recorded at 600 mL over the last 24 hours which is increased.  Serosanguineous drainage.  Fasciotomies have cleaned up nicely with wet-to-dry with Dakin's.  Brisk PT signal at the ankle.  Ischemic changes to the toes that we will monitor but no further intervention from a vascular standpoint.  Severely  malnourished.  Appreciate medical oncology seeing him yesterday for his metastatic bladder cancer.  He did get a biopsy of the lymph node in IR.  Nephrology following as well.    [name], MD [facility] Office: [phone]

## 2021-08-02 NOTE — Progress Notes (Signed)
Physical Therapy Treatment Patient Details Name: Arthur Mcmahon MRN: [id number] DOB: 28-Sep-1965 Today's Date: 08/02/2021   History of Present Illness Pt is a 55 y.o. male admitted on 07/13/21 for necrosis and lymphatic leakage of RLE. S/p debridement of fasciotomy incisions and wound VAC placement of groin incision on 11/9. S/p L supraclavicular lymph node biopsy 11/28. PMH includes metastatic bladder cancer with associated R hydroureteronephrosis and hematuria, normocytic anemia, chronic hyponatremia, severe protein calorie malnutrition, tobacco use, PAD, HTN; of note, pt s/p right thrombectomy and 4 compartment fasciotomies on 06/19/21.   PT Comments    Pt slowly progressing with mobility; requires max encouragement/education on need for OOB activity today since pt has declined OOB with PT/OT since 11/23. Pt requiring up to [place] for brief bout of standing activity, max assist for ADL tasks. Pt remains limited by RLE pain, generalized weakness, decreased activity tolerance, and impaired balance strategies/postural reactions. Increased time discussing d/c planning; pt reports roommates able to provide necessary physical assist for mobility, ADLs and household tasks. Will plan to further initiate w/c training next session since ambulation distance very limited by pain.    Recommendations for follow up therapy are one component of a multi-disciplinary discharge planning process, led by the attending physician.  Recommendations may be updated based on patient status, additional functional criteria and insurance authorization.  Follow Up Recommendations  Home health PT     Assistance Recommended at Discharge [facility] bed    Recommendations for Other Services       Precautions / Restrictions Precautions Precautions: Fall;Other (comment) Precaution Comments: wound vac right groin area; bowel incontinence     Mobility  Bed  Mobility Overal bed mobility: Needs Assistance Bed Mobility: Supine to Sit     Supine to sit: Min assist;HOB elevated     General bed mobility comments: MinA for RLE management to EOB, able to elevate trunk himself with use of bed rail; increased time and effort    Transfers Overall transfer level: Needs assistance Equipment used: Rolling walker (2 wheels) Transfers: Sit to/from Stand;Bed to chair/wheelchair/BSC Sit to Stand: Min assist;Mod assist     Step pivot transfers: Min assist     General transfer comment: Increased time and effor to stand, minA for initial trunk elevation with modA to maintain balance when transitioning BUE support from bed to RW, verbal cues for foot placement prior to standing; step pivot to recliner with significant increased time as pt taking very short steps due to painful RLE, repeated verbal cues for sequencing as pt constantly looking back to see if at chair despite cues, minA for RW management. Pt declined further mobility secondary to pain    Ambulation/Gait                   Stairs             Wheelchair Mobility    Modified Rankin (Stroke Patients Only)       Balance Overall balance assessment: Needs assistance Sitting-balance support: No upper extremity supported;Feet supported Sitting balance-Leahy Scale: Fair     Standing balance support: Bilateral upper extremity supported Standing balance-Leahy Scale: Poor Standing balance comment: heavy reliane once BUE support for stability and offloading painful RLE; dependent on posterior pericare                            Cognition Arousal/Alertness: Awake/alert Behavior During Therapy: Flat affect Overall Cognitive Status: Within  Functional Limits for tasks assessed                                 General Comments: WFL for simple tasks, but not formally assessed. Pt asked about bowel incontinence (that appeared to have been there a while), pt  reports, "Oh, I figured that might have happened" but could not give a reason why he did not inform nursing staff. Able to problem solve through DME needs at home, but requires through to think through all physical assist he will need from others for mobility/ADLs. Requires max encouragement to participate in OOB activity; at end of session, states, "Thanks for pushing me... it shows you care"        Exercises      General Comments General comments (skin integrity, edema, etc.): Pt initially declining OOB activity; max education and encouragement on importance of mobility since pt has not been OOB in ~1 wk, now with bowel incontinence, feeling weaker, etc. Pt's brother present and supportive. Increased time discussing d/c planning with pt and brother; pt planning for home, has necessary DME (wheelchair, walker, BSC) and reports having necessary assist from 2 roommates (pt verifies they can physically assist with things like standing/walking and ADLs, including cooking, pericare and washup if needed)      Pertinent Vitals/Pain Pain Assessment: Faces Faces Pain Scale: Hurts even more Pain Location: R foot Pain Descriptors / Indicators: Aching;Discomfort;Grimacing;Pressure Pain Intervention(s): Monitored during session;Limited activity within patient's tolerance;Repositioned;Premedicated before session    Home Living                          Prior Function            PT Goals (current goals can now be found in the care plan section) Acute Rehab PT Goals Patient Stated Goal: to go home PT Goal Formulation: With patient Time For Goal Achievement: 08/16/21 Potential to Achieve Goals: Fair Progress towards PT goals: Progressing toward goals    Frequency    Min 3X/week      PT Plan Current plan remains appropriate    Co-evaluation              AM-PAC PT "6 Clicks" Mobility   Outcome Measure  Help needed turning from your back to your side while in a flat bed  without using bedrails?: A Little Help needed moving from lying on your back to sitting on the side of a flat bed without using bedrails?: A Little Help needed moving to and from a bed to a chair (including a wheelchair)?: A Little Help needed standing up from a chair using your arms (e.g., wheelchair or bedside chair)?: A Lot Help needed to walk in hospital room?: A Little Help needed climbing 3-5 steps with a railing? : A Lot 6 Click Score: 16    End of Session Equipment Utilized During Treatment: Gait belt Activity Tolerance: Patient tolerated treatment well;Patient limited by pain Patient left: in chair;with call bell/phone within reach;with family/visitor present Nurse Communication: Mobility status PT Visit Diagnosis: Other abnormalities of gait and mobility (R26.89);Muscle weakness (generalized) (M62.81);Unsteadiness on feet (R26.81);Difficulty in walking, not elsewhere classified (R26.2);Pain Pain - Right/Left: Right Pain - part of body: Leg     Time: 6195-0932 PT Time Calculation (min) (ACUTE ONLY): 31 min  Charges:  $Therapeutic Activity: 23-37 mins  Mabeline Caras, PT, DPT Acute Rehabilitation Services  Pager [phone] Office [place] 08/02/2021, 1:04 PM

## 2021-08-02 NOTE — Progress Notes (Signed)
Mobility Specialist: Progress Note   08/02/21 1323  Mobility  Activity Transferred:  Chair to bed  Level of Assistance +2 (takes two people)  Assistive Device  (HHA)  Distance Ambulated (ft) 2 ft  Mobility Out of bed to chair with meals  Mobility Response Tolerated fair  Mobility performed by Mobility specialist;Nurse tech  $Mobility charge 1 Mobility   Pt assisted back to bed per request. Pt required +2 HHA to transfer back to the bed while blocking R knee throughout. Pt required verbal cues for foot and hand placement. Pt back to bed with call bell at his side.   [facility] Arihanna Estabrook Mobility Specialist Mobility Specialist Phone #1: [phone] Mobility Specialist Phone #2: [phone]

## 2021-08-02 NOTE — Progress Notes (Signed)
This chaplain responded to NP-Kristin Curcio consult for updating/creating the Pt. Advance Directive. The Pt. is awake and shares the pain in his right heel is a "9". The chaplain updated the Pt. RN-Jessica.  The Pt. is willing to begin AD education with the chaplain.  The chaplain understands the Pt. is choosing his sister-Lisa as his HCPOA. The chaplain understands the Pt. has a wife who is recovering from hand surgery.   The chaplain introduced Palliative Care to the Pt. The Pt. is hopeful Lattie Haw can be included in ongoing goals of care conversations.  This chaplain will F/U with the Pt. on Wednesday to continue Advance Directive education and decisions.  Chaplain Sallyanne Kuster [phone]

## 2021-08-02 NOTE — Progress Notes (Signed)
Nutrition Follow-up  DOCUMENTATION CODES:   Severe malnutrition in context of chronic illness  INTERVENTION:   Continue Ensure Enlive po BID, each supplement provides 350 kcal and 20 grams of protein  Continue Juven BID, each packet provides 80 calories, 8 grams of carbohydrate, 2.5  grams of protein (collagen), 7 grams of L-arginine and 7 grams of L-glutamine; supplement contains CaHMB, Vitamins C, E, B12 and Zinc to promote wound healing  Continue MVI with minerals daily  Continue extra gravy with meals  NUTRITION DIAGNOSIS:   Severe Malnutrition related to chronic illness (PVD) as evidenced by percent weight loss, severe muscle depletion.  Ongoing  GOAL:   Patient will meet greater than or equal to 90% of their needs  Progressing  MONITOR:   PO intake, Supplement acceptance, Labs, Weight trends, Skin, I & O's  REASON FOR ASSESSMENT:   Consult Assessment of nutrition requirement/status, Poor PO, Wound healing  ASSESSMENT:   55 yo male with a PMH of metastatic bladder cancer with lymphadenopathy in the groin, HTN, EtOH abuse, PVD, and anemia who presents with wound VAC placement of groin incision today for ongoing lymphatic leak and debridement of necrotic lower extremity fasciotomy incisions.  VAC output 600 ml x 24 hours. Plans for [facility] change tomorrow.   Remains on a regular diet; meal intakes 0% x 3 meals yesterday, 10% breakfast this morning.  Supplements: Juven BID, Ensure Enlive/Plus BID Patient states that he has been eating well. Says he was sleepy yesterday and that's probably why he didn't eat much. He is drinking some Ensure, but usually only once per day. He still has trouble chewing some meats, but declines downgrading to chopped or pureed meats.   Labs reviewed. Na 122, phos 5.5 Medications reviewed and include MVI with minerals, Juven, sodium chloride TID with meals, IV Rocephin.  Admission weight 84.8 kg Current weight 90.7 kg  Diet Order:    Diet Order             Diet regular Room service appropriate? Yes with Assist; Fluid consistency: Thin  Diet effective now                   EDUCATION NEEDS:   Education needs have been addressed  Skin:  Skin Assessment: Skin Integrity Issues: Skin Integrity Issues:: Wound VAC, Stage II Stage II: R buttocks, coccyx Wound Vac: R groin  Last BM:  11/29  Height:   Ht Readings from Last 1 Encounters:  07/13/21 6\' 2"  (1.88 m)    Weight:   Wt Readings from Last 1 Encounters:  08/02/21 90.7 kg   BMI:  Body mass index is 25.68 kg/m.  Estimated Nutritional Needs:   Kcal:  2300-2500  Protein:  110-130 grams  Fluid:  >2.3 L   Lucas Mallow, RD, LDN, CNSC Please refer to Amion for contact information.

## 2021-08-03 ENCOUNTER — Telehealth (HOSPITAL_COMMUNITY): Payer: Self-pay | Admitting: Pharmacist

## 2021-08-03 DIAGNOSIS — Z515 Encounter for palliative care: Secondary | ICD-10-CM

## 2021-08-03 DIAGNOSIS — T8131XS Disruption of external operation (surgical) wound, not elsewhere classified, sequela: Secondary | ICD-10-CM

## 2021-08-03 DIAGNOSIS — C7910 Secondary malignant neoplasm of unspecified urinary organs: Secondary | ICD-10-CM

## 2021-08-03 DIAGNOSIS — Z66 Do not resuscitate: Secondary | ICD-10-CM

## 2021-08-03 LAB — BPAM RBC
Blood Product Expiration Date: 202212012359
ISSUE DATE / TIME: 202211291526
Unit Type and Rh: 9500

## 2021-08-03 LAB — TYPE AND SCREEN
ABO/RH(D): O NEG
Antibody Screen: NEGATIVE
Unit division: 0

## 2021-08-03 LAB — RENAL FUNCTION PANEL
Albumin: 2.5 g/dL — ABNORMAL LOW (ref 3.5–5.0)
Anion gap: 10 (ref 5–15)
BUN: 61 mg/dL — ABNORMAL HIGH (ref 6–20)
CO2: 21 mmol/L — ABNORMAL LOW (ref 22–32)
Calcium: 9.2 mg/dL (ref 8.9–10.3)
Chloride: 95 mmol/L — ABNORMAL LOW (ref 98–111)
Creatinine, Ser: 2.16 mg/dL — ABNORMAL HIGH (ref 0.61–1.24)
GFR, Estimated: 35 mL/min — ABNORMAL LOW (ref >60)
Glucose, Bld: 71 mg/dL (ref 70–99)
Phosphorus: 4.7 mg/dL — ABNORMAL HIGH (ref 2.5–4.6)
Potassium: 4.6 mmol/L (ref 3.5–5.1)
Sodium: 126 mmol/L — ABNORMAL LOW (ref 135–145)

## 2021-08-03 LAB — CBC
HCT: 27.2 % — ABNORMAL LOW (ref 39.0–52.0)
Hemoglobin: 8.8 g/dL — ABNORMAL LOW (ref 13.0–17.0)
MCH: 28.5 pg (ref 26.0–34.0)
MCHC: 32.4 g/dL (ref 30.0–36.0)
MCV: 88 fL (ref 80.0–100.0)
Platelets: 463 10*3/uL — ABNORMAL HIGH (ref 150–400)
RBC: 3.09 MIL/uL — ABNORMAL LOW (ref 4.22–5.81)
RDW: 15.7 % — ABNORMAL HIGH (ref 11.5–15.5)
WBC: 12.6 10*3/uL — ABNORMAL HIGH (ref 4.0–10.5)
nRBC: 0 % (ref 0.0–0.2)

## 2021-08-03 MED ORDER — DAKINS (1/4 STRENGTH) 0.125 % EX SOLN
Freq: Two times a day (BID) | CUTANEOUS | Status: AC
  Administered 2021-08-05: 1
  Filled 2021-08-03: qty 473

## 2021-08-03 MED ORDER — CEFADROXIL 500 MG PO CAPS
500.0000 mg | ORAL_CAPSULE | Freq: Two times a day (BID) | ORAL | Status: AC
  Administered 2021-08-03 – 2021-08-06 (×6): 500 mg via ORAL
  Filled 2021-08-03 (×6): qty 1

## 2021-08-03 NOTE — Progress Notes (Signed)
Family Medicine Teaching Service Daily Progress Note Intern Pager: [phone]  Patient name: Arthur Mcmahon Medical record number: [id number] Date of birth: Sep 09, 1965 Age: 55 y.o. Gender: male  Primary Care Provider: Shary Key, DO Consultants: Vascular Surgery, Nephrology, Oncology Code Status: Partial  Pt Overview and Major Events to Date:  11/9 - Admitted s/p vasc surg 11/28 - US guided left supraclavicular lymph node biopsy - IR 11/29 - Patient made Partial Code  Assessment and Plan:  Arthur Mcmahon is a 55 y.o. male currently admitted with vascular complications and lymphedema due to metastatic bladder cancer. He was admitted 11/9 postoperatively after right common femoral endarterectomy, lower extremity thrombectomy and fasciotomies, now with a wound VAC placement. PMHx includes metastatic Stage IVA [id number] urothelial carcinoma of the bladder, (right hydroureteronephrosis) with widely metastatic lymphadenopathy (extensive retroperitoneal, pelvic, left supraclavicular, left axilla, left para-aortic, retrocrural adenopathy), right lower extremity ischemia s/p vascular surgery above, RLE edema (caused by extrinsic compression of right common femoral vein adenopathy), normocytic anemia, chronic hyponatremia, severe protein calorie malnutrition, and tobacco use.    POD #21 S/p debridement of necrosis of surgical incisions Patient POD 21, with wound vac drainage of 375 ml yesterday. Patient fluid status unchanged with pitting edema to level of ribs. Compression legging on right extremity to thigh, controlling edema. -Continue to monitor drain output -MWF wound vac changes, BID fasciotomy wound changes -Continue Tylenol -Continue Oxy IR 5-15 mg q4hr prn -Gabapentin 300 mg TID   Hyponatremia  Hyperkalemia  Hypercalcemia  Hyperphosphatemia - improved Na 126, improved from 122 yesterday. K 4.6 today from 5.1 yesterday. Ca slightly elevated at 10.4, Phos slightly elevated to 4.7 from 5.5  yesterday.  -Nephrology following, appreciate recs -Daily BMP -Continue mIVF at 75 mL/hr -Continue Salt tablet   UTI Continues to complain of dysuria. Ucx still pending. WBC stable at 12.6. Will transition patient to oral antibiotics. -D/c Ceftriaxone 1 g daily (4/7) -Start Cefadroxil 500 mg BID -Follow up Ucx    AKI  CKD IIIb Cr 2.16 improved today down from 2.45, baseline around 8-9. UOP 1.1 L -Continue mIVF   Metastic urothelial carcinoma of the bladder Biopsy results showed metastatic carcinoma, morphologically compatible with urothelial carcinoma. -Oncology following, appreciate recs -Oncology appointment 08/05/21 -Urology appointment 08/18/21  Hgb Hgb stable today at 8.8 after 1 unit PRBC yesterday.  Constipation -Hold Miralax -Hold Senna -Colace BID prn  Pressure ulcer of coccygeal region, stage 2  Nursing using skin care protocols for pressure ulcers  Protein-calorie malnutrition, severe -Continue nutrition recommendations   Peripheral artery disease -continue atorvastatin 40  Hypothyroidism -Continue Synthroid 25 mcg  FEN/GI: Regular Diet PPx: Eliquis Dispo:Home with home health  pending clinical improvement .    Subjective:  Patient sitting comfortably in bed, eating breakfast, rates leg pain at 7-8/10.   Objective: Temp:  [96.9 F (36.1 C)-98.3 F (36.8 C)] 96.9 F (36.1 C) (11/30 0525) Pulse Rate:  [78-92] 78 (11/30 0525) Resp:  [14-19] 17 (11/30 0525) BP: (107-118)/(62-74) 113/71 (11/30 0525) SpO2:  [95 %-100 %] 97 % (11/30 0525) Weight:  [92.5 kg] 92.5 kg (11/30 0525) Physical Exam: General: Frail, white male, NASD Cardiovascular: RRR, NRMG Respiratory: CTABL Abdomen: Soft, NTTP, 1+ pitting edema on right side Extremities: Moving all extremities independently, LRE in compression legging, 3+ pitting edema at level of thigh to level or ribs  Laboratory: Recent Labs  Lab 08/01/21 0452 08/02/21 0446 08/02/21 2113 08/03/21 0131  WBC  14.9* 12.3*  --  12.6*  HGB 8.2* 7.4* 9.0*  8.8*  HCT 25.5* 23.4* 28.1* 27.2*  PLT 528* 523*  --  463*   Recent Labs  Lab 07/31/21 1840 07/31/21 2123 08/01/21 0452 08/01/21 1619 08/02/21 0446 08/03/21 0131  NA  --  124* 124* 124* 122* 126*  K  --  6.0* 5.7* 5.4* 5.1 4.6  CL  --  90* 93* 89* 90* 95*  CO2  --  23 23 26 24  21*  BUN  --  59* 61* 64* 65* 61*  CREATININE  --  2.18* 2.43* 2.49* 2.45* 2.16*  CALCIUM  --  9.4 9.4 9.4 9.5 9.2  PROT 6.4* 6.1* 5.9*  --   --   --   BILITOT 0.6 0.4 0.5  --   --   --   ALKPHOS 91 82 81  --   --   --   ALT 9 9 9   --   --   --   AST 26 19 19   --   --   --   GLUCOSE  --  80 77 98 90 71      Imaging/Diagnostic Tests:   Holley Bouche, MD 08/03/2021, 5:54 AM PGY-1, [facility] Intern pager: [phone], text pages welcome

## 2021-08-03 NOTE — Telephone Encounter (Signed)
[place] Patient Assistance to update status on Eliquis patient assistance approval.  Confirmed that patient approved for Eliquis patient assistance on 07/19/2021. Patient is approved for program until 07/18/2022.   Eliquis shipped to patient's house on 07/21/2021. Eliquis delivered to patient's house on 07/22/2021.  Patient is currently admitted in hospital.  Thank you for involving pharmacy in this patient's care.  Elita Quick, PharmD PGY1 Ambulatory Care Pharmacy Resident 08/03/2021 11:35 AM

## 2021-08-03 NOTE — Progress Notes (Signed)
FPTS Brief Progress Note  S:In to see patient earlier in the night. He was sleeping, did not awaken. Discussed with RN who voiced no concerns.   O: BP 116/71 (BP Location: Right Arm)   Pulse 78   Temp 97.9 F (36.6 C) (Oral)   Resp 17   Ht 6\' 2"  (1.88 m)   Wt 90.7 kg   SpO2 100%   BMI 25.68 kg/m   Gen: Asleep, resting comfortably  A/P: Stable.  Continue plan per day team.   - Orders reviewed. Labs for AM ordered, which was adjusted as needed.   [name], DO 08/03/2021, 12:02 AM PGY-2, [place] [facility] Night Resident  Please page [phone] with questions.

## 2021-08-03 NOTE — Progress Notes (Signed)
Physical Therapy Treatment Patient Details Name: Arthur Mcmahon MRN: [id number] DOB: 03-May-1966 Today's Date: 08/03/2021   History of Present Illness Pt is a 55 y.o. male admitted on 07/13/21 for necrosis and lymphatic leakage of RLE. S/p debridement of fasciotomy incisions and wound VAC placement of groin incision on 11/9. S/p L supraclavicular lymph node biopsy 11/28. PMH includes metastatic bladder cancer with associated R hydroureteronephrosis and hematuria, normocytic anemia, chronic hyponatremia, severe protein calorie malnutrition, tobacco use, PAD, HTN; of note, pt s/p right thrombectomy and 4 compartment fasciotomies on 06/19/21.    PT Comments    Pt is slowly progressing with mobility. Initiated w/c training today and pt demonstrated good understanding. Required anywhere from mod-max assist +1-2 for standing activities. Pt continues to be limited by RLE pain, generalized weakness, decreased activity tolerance and impaired balance. Requires encouragement to be as independent as possible. Pt continues to report he has the necessary help at home. Will continue to progress w/c training next session to maximize independence and safety upon return home.    Recommendations for follow up therapy are one component of a multi-disciplinary discharge planning process, led by the attending physician.  Recommendations may be updated based on patient status, additional functional criteria and insurance authorization.  Follow Up Recommendations  Home health PT     Assistance Recommended at Discharge [facility] bed    Recommendations for Other Services       Precautions / Restrictions Precautions Precautions: Fall;Other (comment) Precaution Comments: wound vac right groin area; bowel incontinence Restrictions Weight Bearing Restrictions: No     Mobility  Bed Mobility Overal bed mobility: Needs Assistance Bed Mobility: Supine  to Sit Rolling: Modified independent (Device/Increase time)   Supine to sit: Min assist;HOB elevated Sit to supine: Mod assist   General bed mobility comments: Min A for RLE management to EOB, used handheld assist and rail to elevate trunk; increased time and effort. Mod A +2 to go sit to supine as pt was close to EOB. Able to roll both ways with use of bedrails.    Transfers Overall transfer level: Needs assistance Equipment used: Rolling walker (2 wheels);None Transfers: Sit to/from Stand;Bed to chair/wheelchair/BSC Sit to Stand: Max assist;+2 physical assistance;Mod assist;From elevated surface Stand pivot transfers: +2 physical assistance;Max assist;Mod assist   Step pivot transfers: Mod assist;+2 safety/equipment;Max assist     General transfer comment: Failed first attempt at standing from EOB. Readjusted feet and elevated bed then performed with Mod assist (+2 for safety) for elevation and stability. Step pivot to wheelchair with Mod-Max assist (+2 for safety). Attempted stand pivot from wheelchair to bed with RW and +2 assist 4 times but was unsuccessful. Pt having difficulties with hand placement and pivoting hips to bed. At this point, pt was very fatigued. Completed step pivot with RW and Mod-Max A +2.    Ambulation/Gait                   Theme park manager mobility: Yes Wheelchair propulsion: Left lower extremity Wheelchair parts: Needs assistance Distance: 29ft Wheelchair Assistance Details (indicate cue type and reason): Pt's first time using a wheelchair today. Educated him on wheelchair parts (arm rests, brakes, footrest). Pt propelled himself to set up for the transfer back to bed using his LLE. Encouraged pt to use his arms as well.  Modified Rankin (Stroke Patients Only)  Balance Overall balance assessment: Needs assistance Sitting-balance support: No upper extremity supported;Feet  supported Sitting balance-Leahy Scale: Fair Sitting balance - Comments: able to sit EOB without physical assist   Standing balance support: Bilateral upper extremity supported Standing balance-Leahy Scale: Poor Standing balance comment: heavy reliance on BUE support for stability and to offload painful RLE; dependent on posterior pericare                            Cognition Arousal/Alertness: Awake/alert Behavior During Therapy: WFL for tasks assessed/performed Overall Cognitive Status: Impaired/Different from baseline Area of Impairment: Following commands;Memory;Orientation;Problem solving                 Orientation Level: Disoriented to;Time   Memory: Decreased short-term memory Following Commands: Follows one step commands with increased time     Problem Solving: Requires verbal cues;Slow processing;Difficulty sequencing General Comments: Required max verbal cues during transfers for hand placement. Follwed  commands but took increased time to process and often asked for clarification.        Exercises      General Comments General comments (skin integrity, edema, etc.): Pt was agreeable to therapy, but still needs encouragement to be as independent as possible. Pt initally wanted +2 assist to get him back to bed - educated him on the importance of doing as much as he could to improve strength and prepare for return home.      Pertinent Vitals/Pain Pain Assessment: 0-10 Pain Score: 7  Pain Location: RLE Pain Descriptors / Indicators: Aching;Discomfort;Grimacing;Pressure Pain Intervention(s): Monitored during session;Premedicated before session;Limited activity within patient's tolerance;Repositioned    Home Living                          Prior Function            PT Goals (current goals can now be found in the care plan section) Acute Rehab PT Goals Patient Stated Goal: to go home PT Goal Formulation: With patient Time For Goal  Achievement: 08/16/21 Potential to Achieve Goals: Fair Additional Goals Additional Goal #1: Patient will be modified independent with manual wheelchair management, including self-propelling 100' to demonstrate household and community distances. Progress towards PT goals: Progressing toward goals    Frequency    Min 3X/week      PT Plan Current plan remains appropriate    Co-evaluation              AM-PAC PT "6 Clicks" Mobility   Outcome Measure  Help needed turning from your back to your side while in a flat bed without using bedrails?: A Little Help needed moving from lying on your back to sitting on the side of a flat bed without using bedrails?: A Little Help needed moving to and from a bed to a chair (including a wheelchair)?: Total Help needed standing up from a chair using your arms (e.g., wheelchair or bedside chair)?: A Lot Help needed to walk in hospital room?: Total Help needed climbing 3-5 steps with a railing? : Total 6 Click Score: 11    End of Session Equipment Utilized During Treatment: Gait belt Activity Tolerance: Patient tolerated treatment well;Patient limited by pain Patient left: in bed;with call bell/phone within reach Nurse Communication: Mobility status (beeping wound vac and IV) PT Visit Diagnosis: Other abnormalities of gait and mobility (R26.89);Muscle weakness (generalized) (M62.81);Unsteadiness on feet (R26.81);Difficulty in walking, not elsewhere classified (R26.2);Pain Pain - Right/Left: Right  Pain - part of body: Leg     Time: 0930-1040 PT Time Calculation (min) (ACUTE ONLY): 70 min  Charges:  $Therapeutic Activity: 38-52 mins $Wheel Chair Management: 8-22 mins                    [name], SPT   [name] 08/03/2021, 12:19 PM

## 2021-08-03 NOTE — Progress Notes (Addendum)
[facility]  Subjective  - Pain with dressing changes   Objective 113/71 78 (!) 96.9 F (36.1 C) (Axillary) 17 97%  Intake/Output Summary (Last 24 hours) at 08/03/2021 0729 Last data filed at 08/03/2021 0617 Gross per 24 hour  Intake 1832 ml  Output 1475 ml  Net 357 ml       Wet to dry fasciotomy sites with Dakins minimal bluish/green drainage on old dressing. Compression in place PT doppler right LE, toes demarcating without open wounds, no change Right groin with wound vac, RN plans on changing it after breakfast.  OP 375 last 24 hours Lungs non labored breathing   Assessment/Planning: 55 y.o. male is s/p sharp excisional debridement of right groin and bilateral right lower leg fasciotomy incisions. Brisk multiphasic right PT Doppler signal. Wound VAC placed  21 Days Post-Op  Cont. Wet to dry with dakins, beefy red base healing well. Groin drainage fluctuates last 24 hours 375 cc OP.  RN will change wound vac later this am. Cont. To observe right toes for demarcation Leukocytosis 12.6 DVT prophylaxis:  Apixaban  [name] 08/03/2021 7:29 AM --  Laboratory Lab Results: Recent Labs    08/02/21 0446 08/02/21 2113 08/03/21 0131  WBC 12.3*  --  12.6*  HGB 7.4* 9.0* 8.8*  HCT 23.4* 28.1* 27.2*  PLT 523*  --  463*   BMET Recent Labs    08/02/21 0446 08/03/21 0131  NA 122* 126*  K 5.1 4.6  CL 90* 95*  CO2 24 21*  GLUCOSE 90 71  BUN 65* 61*  CREATININE 2.45* 2.16*  CALCIUM 9.5 9.2    COAG Lab Results  Component Value Date   INR 1.0 06/18/2021   INR 1.1 04/11/2021   INR 0.9 03/22/2021   No results found for: PTT  I have seen and evaluated the patient. I agree with the PA note as documented above.  Right lower extremity fasciotomy incisions have cleaned up nicely with wet-to-dry with Dakin's as pictured above.  He has brisk PT signal at the ankle.  We will watch the toes for demarcation.  Due for VAC  change in the right groin today.  Output 375 mL in the last 24 hours.  Continues to have high output given lymphadenopathy with metastatic cancer but the wound is getting smaller.  Arthur Heck, MD [facility] Office: [phone]

## 2021-08-03 NOTE — Consult Note (Signed)
Consultation Note Date: 08/03/2021   Patient Name: Arthur Mcmahon  DOB: 1966/05/31  MRN: [id number]  Age / Sex: 55 y.o., male  PCP: Shary Key, DO Referring Physician: Lind Covert, MD  Reason for Consultation: Establishing goals of care and Psychosocial/spiritual support  HPI/Patient Profile: 55 y.o. male   admitted on 07/13/2021 with  history of recent surgery for critical limb ischemia of the right lower extremity where patient had underwent endarterectomy and fistulotomy with thrombectomy presently on Eliquis had followed up with urology clinic for bladder mass found during imaging during last admission was found to be lethargic weak and difficult to walk was referred to the ER.  In the urology office patient also had Foley catheter placed.    Patient reports increasing lower extremity edema and increasing hematuria.  Patient reports hematuria for the past several months, however family report he told them that it was occurring for years.     Reports no alcohol intake  for the last 6 months.  Patient and family face treatment option decisions, advanced directive decisions and anticipatory care needs.     Clinical Assessment and Goals of Care:  This NP Wadie Lessen reviewed medical records, received report from team, assessed the patient and then meet at the patient's bedside  to discuss diagnosis, prognosis, GOC, EOL wishes disposition and options.   Concept of Palliative Care was introduced as specialized medical care for people and their families living with serious illness.  If focuses on providing relief from the symptoms and stress of a serious illness.  The goal is to improve quality of life for both the patient and the family.  Created space and opportunity for patient  and family to explore thoughts and feelings regarding current medical situation   A  discussion was had today  regarding advanced directives.  Concepts specific to code status, artifical feeding and hydration, continued IV antibiotics and rehospitalization was had.   Values and goals of care important to patient and family were attempted to be elicited.   MOST form introduced.  Education offered on the importance of on going conversation with his family/support persons and his medical providers  regarding treatment options and his GOCs in order to ensure patient centered care.       Questions and concerns addressed.  Patient  encouraged to call with questions or concerns.     PMT will continue to support holistically.   Patient gives me permission to reach out to both Lattie Haw and Jenny Reichmann in hopes of scheduling a family meeting at the bedside.     No documented H POA or advanced care planning documents at this time however spiritual care is working with Mr. Mounce to secure documents.  He verbalizes his desire for his sister Carmon Sails and ex wife Donal Lynam to be his decision makers in the event that he cannot speak for himself.      SUMMARY OF RECOMMENDATIONS    Code Status/Advance Care Planning: Limited code   Palliative Prophylaxis:  Bowel Regimen,  Delirium Protocol, Frequent Pain Assessment, and Oral Care  Additional Recommendations (Limitations, Scope, Preferences): Full Scope Treatment  Psycho-social/Spiritual:  Desire for further Chaplaincy support:yes   Prognosis:  Unable to determine  Discharge Planning: To Be Determined      Primary Diagnoses: Present on Admission:  (Resolved) Malignant neoplasm of urinary bladder (HCC)  Protein-calorie malnutrition, severe  Wound dehiscence, surgical  Lymphadenopathy, retroperitoneal  Lymphadenopathy, periaortic  Lymphadenopathy, left supraclavicular  Pressure ulcer of coccygeal region, stage 2 (HCC)  Hyponatremia  Metastatic urothelial carcinoma (HCC)  Venous obstruction   I have reviewed the medical record, interviewed the  patient and family, and examined the patient. The following aspects are pertinent.  Past Medical History:  Diagnosis Date   Anemia    Cancer ([place])    bladder   Constipation    ETOH abuse    Hypertension    Peripheral vascular disease (HCC)    Pneumonia    Social History   Socioeconomic History   Marital status: Divorced    Spouse name: Not on file   Number of children: Not on file   Years of education: Not on file   Highest education level: Not on file  Occupational History   Not on file  Tobacco Use   Smoking status: Every Day    Packs/day: 0.50    Years: 30.00    Pack years: 15.00    Types: Cigarettes   Smokeless tobacco: Never  Vaping Use   Vaping Use: Never used  Substance and Sexual Activity   Alcohol use: Not Currently   Drug use: Yes    Types: Marijuana    Comment: 2 times a week   Sexual activity: Not on file  Other Topics Concern   Not on file  Social History Narrative   Not on file   Social Determinants of Health   Financial Resource Strain: Not on file  Food Insecurity: Not on file  Transportation Needs: Not on file  Physical Activity: Not on file  Stress: Not on file  Social Connections: Not on file   Family History  Problem Relation Age of Onset   Cancer Mother    Heart failure Mother    Cancer Father    Scheduled Meds:  acetaminophen  1,000 mg Oral Q6H   apixaban  5 mg Oral BID   atorvastatin  40 mg Oral Daily   cefadroxil  500 mg Oral BID   Chlorhexidine Gluconate Cloth  6 each Topical Daily   feeding supplement  237 mL Oral BID BM   gabapentin  300 mg Oral TID   levothyroxine  25 mcg Oral QAC breakfast   multivitamin with minerals  1 tablet Oral Daily   nutrition supplement (JUVEN)  1 packet Oral BID BM   sodium chloride flush  10-40 mL Intracatheter Q12H   sodium chloride  1 g Oral TID WC   sodium hypochlorite   Irrigation BID   Continuous Infusions: PRN Meds:.docusate sodium, oxyCODONE, sodium chloride flush Medications  Prior to Admission:  Prior to Admission medications   Medication Sig Start Date End Date Taking? Authorizing Provider  acetaminophen (TYLENOL) 500 MG tablet Take 500 mg by mouth every 6 (six) hours as needed for mild pain, fever or headache.   Yes [provider]  apixaban (ELIQUIS) 5 MG TABS tablet Take 1 tablet (5 mg total) by mouth 2 (two) times daily. 06/29/21  Yes Rhyne, Hulen Shouts, PA-C  HYDROcodone-acetaminophen (NORCO/VICODIN) 5-325 MG tablet Take 2 tablets by mouth every 6 (  six) hours as needed for severe pain.   Yes [provider]  Multiple Vitamin (MULTIVITAMIN WITH MINERALS) TABS tablet Take 1 tablet by mouth daily. 06/22/21  Yes Rhyne, Hulen Shouts, PA-C  oxyCODONE-acetaminophen (PERCOCET) 10-325 MG tablet Take 1 tablet by mouth every 6 (six) hours as needed for pain. 07/12/21 07/12/22 Yes Setzer, Edman Circle, PA-C  senna-docusate (SENOKOT-S) 8.6-50 MG tablet Take 2 tablets by mouth 2 (two) times daily. 07/08/21  Yes Cherene Altes, MD   No Known Allergies Review of Systems  Constitutional:  Positive for fatigue.  Neurological:  Positive for weakness.   Physical Exam Constitutional:      Appearance: He is cachectic. He is ill-appearing.  Cardiovascular:     Rate and Rhythm: Normal rate.  Skin:    General: Skin is warm and dry.  Neurological:     Mental Status: He is alert.    Vital Signs: BP 115/74 (BP Location: Right Arm)   Pulse 81   Temp 98.1 F (36.7 C) (Oral)   Resp 14   Ht 6\' 2"  (1.88 m)   Wt 92.5 kg   SpO2 100%   BMI 26.19 kg/m  Pain Scale: 0-10 POSS *See Group Information*: S-Acceptable,Sleep, easy to arouse Pain Score: 4    SpO2: SpO2: 100 % O2 Device:SpO2: 100 % O2 Flow Rate: .O2 Flow Rate (L/min): 0 L/min  IO: Intake/output summary:  Intake/Output Summary (Last 24 hours) at 08/03/2021 1526 Last data filed at 08/03/2021 1221 Gross per 24 hour  Intake 2072 ml  Output 1775 ml  Net 297 ml    LBM: Last BM Date: 08/03/21 Baseline  Weight: Weight: 84.8 kg Most recent weight: Weight: 92.5 kg     Palliative Assessment/Data: 30 % at best   Discussed with Dr Erin Hearing  Time In: 0900 Time Out: 1010 Time Total: 70 minutes Greater than 50%  of this time was spent counseling and coordinating care related to the above assessment and plan.  Signed by: Wadie Lessen, NP   Please contact Palliative Medicine Team phone at [phone] for questions and concerns.  For individual provider: See Shea Evans

## 2021-08-03 NOTE — Progress Notes (Signed)
[facility] NEPHROLOGY PROGRESS NOTE  Assessment/ Plan: Pt is a 55 y.o. yo male  with past medical history of hypertension, who was admitted with vascular complications and lymphedema due to metastatic bladder cancer, seen as a consultation for the evaluation of acute kidney injury.  #Acute kidney injury, nonoliguric: Likely ischemic ATN due to intravascular volume depletion/sepsis/surgery with hemodynamic changes +/- right hydronephrosis.  CK level not elevated. Urinalysis with UTI and minimal protein.  The recent ultrasound showed improved right hydronephrosis.  No recent IV contrast or renal insult identified.   Treated with IV fluid in addition to IV albumin with improvement of renal function.  The creatinine level is down to 2.1 with improvement of serum sodium level.  He can eat and drink therefore I will stop IV fluid.  Continue strict ins and out and daily lab monitoring for renal recovery.   #Hyponatremia: He looks euvolemic on exam and asymptomatic.  TSH level elevated consistent with hypothyroidism.  This can cause hyponatremia therefore recommend to start thyroid hormone.  Cortisol level acceptable.  Urine sodium is low.  Serum sodium level improved to 126 today.  I will discontinue IV fluid and plan to continue salt tablet.  Please discontinue salt tablet when serum sodium level reaches 130.  He may also have high ADH state in the setting of malignancy.    #Hyperkalemia: Improved with Lokelma and monitor lab.   #Metastatic urothelial carcinoma of the bladder: s/p TURBT on 10/31. Oncology is following and he underwent IR guided lymph node biopsy today.  Also seen by urologist for right hydronephrosis.   #Severe protein calorie malnutrition: Continue Ensure and follow dietitian.   #Sepsis of right groin: s/p debridement of necrosis of surgical incision site: Currently has right groin wound VAC.  Wet-to-dry dressing for the fasciotomies and right lower extremity.  Plan as  outlined above. Sign off, please call back with question.   Subjective: Seen and examined.  Urine output recorded around 1.1 L.  He denies nausea, vomiting, chest pain, shortness of breath.  No new event.  Discussed with the patient's nurse.  Objective Vital signs in last 24 hours: Vitals:   08/02/21 1904 08/02/21 2316 08/03/21 0525 08/03/21 0750  BP: 118/72 116/71 113/71 114/74  Pulse: 78 78 78 76  Resp: 14 17 17 18   Temp: 98 F (36.7 C) 97.9 F (36.6 C) (!) 96.9 F (36.1 C) 98.1 F (36.7 C)  TempSrc: Oral Oral Axillary Oral  SpO2: 96% 100% 97% 100%  Weight:   92.5 kg   Height:       Weight change: 1.814 kg  Intake/Output Summary (Last 24 hours) at 08/03/2021 1058 Last data filed at 08/03/2021 0852 Gross per 24 hour  Intake 2072 ml  Output 1475 ml  Net 597 ml        Labs: Basic Metabolic Panel: Recent Labs  Lab 08/01/21 1619 08/02/21 0446 08/03/21 0131  NA 124* 122* 126*  K 5.4* 5.1 4.6  CL 89* 90* 95*  CO2 26 24 21*  GLUCOSE 98 90 71  BUN 64* 65* 61*  CREATININE 2.49* 2.45* 2.16*  CALCIUM 9.4 9.5 9.2  PHOS  --  5.5* 4.7*    Liver Function Tests: Recent Labs  Lab 07/31/21 1840 07/31/21 2123 08/01/21 0452 08/02/21 0446 08/03/21 0131  AST 26 19 19   --   --   ALT 9 9 9   --   --   ALKPHOS 91 82 81  --   --   BILITOT  0.6 0.4 0.5  --   --   PROT 6.4* 6.1* 5.9*  --   --   ALBUMIN 1.8* 1.6* 1.6* 2.6* 2.5*    No results for input(s): LIPASE, AMYLASE in the last 168 hours. No results for input(s): AMMONIA in the last 168 hours. CBC: Recent Labs  Lab 07/31/21 1822 08/01/21 0452 08/02/21 0446 08/02/21 2113 08/03/21 0131  WBC 18.7* 14.9* 12.3*  --  12.6*  HGB 9.2* 8.2* 7.4* 9.0* 8.8*  HCT 28.7* 25.5* 23.4* 28.1* 27.2*  MCV 88.0 87.9 88.3  --  88.0  PLT 534* 528* 523*  --  463*    Cardiac Enzymes: Recent Labs  Lab 08/02/21 0446  CKTOTAL 16*    CBG: No results for input(s): GLUCAP in the last 168 hours.  Iron Studies: No results for  input(s): IRON, TIBC, TRANSFERRIN, FERRITIN in the last 72 hours. Studies/Results: [place] CORE BIOPSY (LYMPH NODES)  Result Date: 08/01/2021 INDICATION: History of urothelial cancer.  Enlarged supraclavicular lymph nodes. EXAM: ULTRASOUND-GUIDED LEFT SUPRACLAVICULAR LYMPH NODE BIOPSY COMPARISON:  CT chest, 07/02/2019. MEDICATIONS: None ANESTHESIA/SEDATION: Local anesthetic was administered. COMPLICATIONS: None immediate. TECHNIQUE: Informed written consent was obtained from the patient after a discussion of the risks, benefits and alternatives to treatment. Questions regarding the procedure were encouraged and answered. Initial ultrasound scanning demonstrated pathologically-enlarged LEFT supraclavicular lymph nodes. An ultrasound image was saved for documentation purposes. The procedure was planned. A timeout was performed prior to the initiation of the procedure. The operative was prepped and draped in the usual sterile fashion, and a sterile drape was applied covering the operative field. A timeout was performed prior to the initiation of the procedure. Local anesthesia was provided with 1% lidocaine. Under direct ultrasound guidance, an 18 gauge core needle device was utilized to obtain to obtain 3 core needle biopsies of the LEFT supraclavicular lymph nodes. The samples were placed in saline and submitted to pathology. The needle was removed and hemostasis was achieved with manual compression. Post procedure scan was negative for significant hematoma. A dressing was placed. The patient tolerated the procedure well without immediate postprocedural complication. IMPRESSION: Successful ultrasound guided biopsy of LEFT supraclavicular lymph nodes. Michaelle Birks, MD [facility] Radiology Electronically Signed   By: Michaelle Birks M.D.   On: 08/01/2021 16:11    Medications: Infusions:  cefTRIAXone (ROCEPHIN)  IV 1 g (08/02/21 2014)    Scheduled Medications:   acetaminophen  1,000 mg Oral Q6H   apixaban  5 mg Oral BID   atorvastatin  40 mg Oral Daily   Chlorhexidine Gluconate Cloth  6 each Topical Daily   feeding supplement  237 mL Oral BID BM   gabapentin  300 mg Oral TID   levothyroxine  25 mcg Oral QAC breakfast   multivitamin with minerals  1 tablet Oral Daily   nutrition supplement (JUVEN)  1 packet Oral BID BM   sodium chloride flush  10-40 mL Intracatheter Q12H   sodium chloride  1 g Oral TID WC   sodium hypochlorite   Irrigation BID    have reviewed scheduled and prn medications.  Physical Exam: General: Not in distress, looks comfortable Heart:RRR, s1s2 nl Lungs: Clear b/l, no crackle Abdomen:soft, Non-tender, non-distended Extremities:Right leg edematous, dressing applied and has groin wound VAC.  No edema in left leg Neurology: Alert, awake and following commands  Prentiss Polio Prasad Kinya Meine 08/03/2021,10:58 AM  LOS: 21 days

## 2021-08-04 DIAGNOSIS — R627 Adult failure to thrive: Secondary | ICD-10-CM

## 2021-08-04 LAB — CBC
HCT: 27.5 % — ABNORMAL LOW (ref 39.0–52.0)
Hemoglobin: 8.9 g/dL — ABNORMAL LOW (ref 13.0–17.0)
MCH: 28.5 pg (ref 26.0–34.0)
MCHC: 32.4 g/dL (ref 30.0–36.0)
MCV: 88.1 fL (ref 80.0–100.0)
Platelets: 493 10*3/uL — ABNORMAL HIGH (ref 150–400)
RBC: 3.12 MIL/uL — ABNORMAL LOW (ref 4.22–5.81)
RDW: 15.7 % — ABNORMAL HIGH (ref 11.5–15.5)
WBC: 13 10*3/uL — ABNORMAL HIGH (ref 4.0–10.5)
nRBC: 0 % (ref 0.0–0.2)

## 2021-08-04 LAB — RENAL FUNCTION PANEL
Albumin: 2.2 g/dL — ABNORMAL LOW (ref 3.5–5.0)
Anion gap: 11 (ref 5–15)
BUN: 60 mg/dL — ABNORMAL HIGH (ref 6–20)
CO2: 22 mmol/L (ref 22–32)
Calcium: 9.3 mg/dL (ref 8.9–10.3)
Chloride: 93 mmol/L — ABNORMAL LOW (ref 98–111)
Creatinine, Ser: 2.21 mg/dL — ABNORMAL HIGH (ref 0.61–1.24)
GFR, Estimated: 35 mL/min — ABNORMAL LOW (ref >60)
Glucose, Bld: 80 mg/dL (ref 70–99)
Phosphorus: 4.2 mg/dL (ref 2.5–4.6)
Potassium: 4.6 mmol/L (ref 3.5–5.1)
Sodium: 126 mmol/L — ABNORMAL LOW (ref 135–145)

## 2021-08-04 LAB — URINE CULTURE: Culture: <10000 — AB

## 2021-08-04 MED ORDER — MIRTAZAPINE 15 MG PO TABS
15.0000 mg | ORAL_TABLET | Freq: Every day | ORAL | Status: DC
  Administered 2021-08-06 – 2021-08-07 (×2): 15 mg via ORAL
  Filled 2021-08-04 (×3): qty 1

## 2021-08-04 MED ORDER — FERROUS SULFATE 325 (65 FE) MG PO TABS
325.0000 mg | ORAL_TABLET | Freq: Every day | ORAL | Status: DC
  Administered 2021-08-04 – 2021-08-08 (×4): 325 mg via ORAL
  Filled 2021-08-04 (×4): qty 1

## 2021-08-04 NOTE — Progress Notes (Signed)
Occupational Therapy Treatment Patient Details Name: Arthur Mcmahon MRN: [id number] DOB: June 05, 1966 Today's Date: 08/04/2021   History of present illness Pt is a 55 y.o. male admitted on 07/13/21 for necrosis and lymphatic leakage of RLE. S/p debridement of fasciotomy incisions and wound VAC placement of groin incision on 11/9. S/p L supraclavicular lymph node biopsy 11/28. PMH includes metastatic bladder cancer with associated R hydroureteronephrosis and hematuria, normocytic anemia, chronic hyponatremia, severe protein calorie malnutrition, tobacco use, PAD, HTN; of note, pt s/p right thrombectomy and 4 compartment fasciotomies on 06/19/21.   OT comments  Pt reported they would attempt to engage in session but not sure what they would be able to complete. Pt required min assist to roll to side, mod for supine to sitting EOB and pt attempted several trials of sit to stand with max x1-2 but unable to clear to transfer. Pt then requesting to go back down into supine position as increase in groin discomfort. Pt currently with functional limitations due to the deficits listed below (see OT Problem List).  Pt will benefit from skilled OT to increase their safety and independence with ADL and functional mobility for ADL to facilitate discharge to venue listed below.     Recommendations for follow up therapy are one component of a multi-disciplinary discharge planning process, led by the attending physician.  Recommendations may be updated based on patient status, additional functional criteria and insurance authorization.    Follow Up Recommendations  Skilled nursing-short term rehab (<3 hours/day)    Assistance Recommended at Discharge Frequent or constant Supervision/Assistance  Equipment Recommendations       Recommendations for Other Services      Precautions / Restrictions Precautions Precautions: Fall;Other (comment) Precaution Comments: wound vac right groin area; bowel  incontinence Restrictions Weight Bearing Restrictions: No RLE Weight Bearing: Weight bearing as tolerated       Mobility Bed Mobility Overal bed mobility: Needs Assistance Bed Mobility: Supine to Sit Rolling: Min assist   Supine to sit: Mod assist;HOB elevated Sit to supine: Max assist;HOB elevated        Transfers Overall transfer level: Needs assistance Equipment used: Rolling walker (2 wheels) Transfers: Sit to/from Stand Sit to Stand: Max assist;+2 physical assistance;+2 safety/equipment (Pt unable to clear from bed)           General transfer comment: Attempted multiple trials at this time but unable to complete transfer     Balance Overall balance assessment: Needs assistance Sitting-balance support: Bilateral upper extremity supported;Feet supported Sitting balance-Leahy Scale: Fair Sitting balance - Comments: able to remain sitting at EOB without assistance but required BUE support                                   ADL either performed or assessed with clinical judgement   ADL Overall ADL's : Needs assistance/impaired Eating/Feeding: Independent;Bed level   Grooming: Wash/dry hands;Wash/dry face;Min guard;Sitting   Upper Body Bathing: Sitting;Minimal assistance   Lower Body Bathing: Maximal assistance;Cueing for safety;Cueing for sequencing;Sitting/lateral leans   Upper Body Dressing : Minimal assistance;Cueing for safety;Cueing for sequencing;Sitting   Lower Body Dressing: Maximal assistance;Cueing for safety;Cueing for sequencing;Sitting/lateral leans       Toileting- Clothing Manipulation and Hygiene: Total assistance;Bed level         General ADL Comments: Pt at this time atempted to EOB level of activity but was unable to complete further due to pain/edema in groin to  completed RLE flexion to assist in transfer    Extremity/Trunk Assessment Upper Extremity Assessment Upper Extremity Assessment: Overall WFL for tasks  assessed   Lower Extremity Assessment Lower Extremity Assessment: Defer to PT evaluation        Vision   Vision Assessment?: No apparent visual deficits   Perception Perception Perception: Not tested   Praxis Praxis Praxis: Not tested    Cognition Arousal/Alertness: Awake/alert Behavior During Therapy: WFL for tasks assessed/performed Overall Cognitive Status: Impaired/Different from baseline Area of Impairment: Following commands;Safety/judgement;Awareness;Problem solving;Memory                     Memory: Decreased short-term memory Following Commands: Follows one step commands with increased time     Problem Solving: Slow processing;Difficulty sequencing;Requires verbal cues;Requires tactile cues General Comments: Pt reported they feel confused with all the people come and going in the room          Exercises     Shoulder Instructions       General Comments      Pertinent Vitals/ Pain       Pain Assessment: 0-10 Pain Score: 8  Pain Location: RLE, groin area Pain Descriptors / Indicators: Aching;Discomfort;Grimacing;Pressure Pain Intervention(s): Monitored during session;Repositioned;Premedicated before session  Home Living                                          Prior Functioning/Environment              Frequency  Min 2X/week        Progress Toward Goals  OT Goals(current goals can now be found in the care plan section)  Progress towards OT goals: Not progressing toward goals - comment (limited ability ability to position RLE to assist in transfer)  Acute Rehab OT Goals Patient Stated Goal: to feel better OT Goal Formulation: With patient Time For Goal Achievement: 08/20/21 Potential to Achieve Goals: Good ADL Goals Pt Will Perform Grooming: with modified independence;sitting;standing Pt Will Perform Lower Body Bathing: with modified independence;sit to/from stand Pt Will Perform Lower Body Dressing: with  modified independence;with adaptive equipment;sit to/from stand Pt Will Transfer to Toilet: ambulating;regular height toilet;with set-up Pt Will Perform Toileting - Clothing Manipulation and hygiene: with modified independence;sit to/from stand Pt Will Perform Tub/Shower Transfer: with modified independence;ambulating;shower seat  Plan Discharge plan remains appropriate    Co-evaluation                 AM-PAC OT "6 Clicks" Daily Activity     Outcome Measure   Help from another person eating meals?: None Help from another person taking care of personal grooming?: A Little Help from another person toileting, which includes using toliet, bedpan, or urinal?: Total Help from another person bathing (including washing, rinsing, drying)?: A Lot Help from another person to put on and taking off regular upper body clothing?: A Little Help from another person to put on and taking off regular lower body clothing?: A Lot 6 Click Score: 15    End of Session Equipment Utilized During Treatment: Gait belt;Rolling walker (2 wheels)  OT Visit Diagnosis: Unsteadiness on feet (R26.81);Other abnormalities of gait and mobility (R26.89);Pain Pain - Right/Left: Right Pain - part of body: Leg   Activity Tolerance Patient limited by pain   Patient Left in bed;with call bell/phone within reach;with nursing/sitter in room   Nurse Communication  (pain level)  Time: 1025-1056 OT Time Calculation (min): 31 min  Charges: OT General Charges $OT Visit: 1 Visit OT Treatments $Self Care/Home Management : 23-37 mins  Joeseph Amor OTR/L  Acute Rehab Services  [phone] office number [phone] pager number   Joeseph Amor 08/04/2021, 11:10 AM

## 2021-08-04 NOTE — Progress Notes (Signed)
Pt refused dressing changed.   Lavenia Atlas, RN

## 2021-08-04 NOTE — Progress Notes (Addendum)
Family Medicine Teaching Service Daily Progress Note Intern Pager: [phone]  Patient name: Arthur Mcmahon Medical record number: [id number] Date of birth: 08-02-1966 Age: 55 y.o. Gender: male  Primary Care Provider: Shary Key, DO Consultants: VVS, nephrology, oncology Code Status: Partial, DNI, desires one round of ACLS in case of arrest  Pt Overview and Major Events to Date:   11/9 - Admitted s/p vasc surg 11/28 - US guided left supraclavicular lymph node biopsy - IR 11/29 - Patient made Partial Code  Assessment and Plan:  Arthur Mcmahon is a 55 y.o. male currently admitted with vascular complications and lymphedema due to metastatic bladder cancer. He was admitted 11/9 postoperatively after right common femoral endarterectomy, lower extremity thrombectomy and fasciotomies, now with a wound VAC placement. PMHx includes metastatic Stage IVA [id number] urothelial carcinoma of the bladder, (right hydroureteronephrosis) with widely metastatic lymphadenopathy (extensive retroperitoneal, pelvic, left supraclavicular, left axilla, left para-aortic, retrocrural adenopathy), right lower extremity ischemia s/p vascular surgery above, RLE edema (caused by extrinsic compression of right common femoral vein adenopathy), normocytic anemia, chronic hyponatremia, severe protein calorie malnutrition, and tobacco use.   POD #22, s/p debridement of fasciotomy wounds, wound vac in place Wound vac drainage of 312mL serosanguinous drainage yesterday. Some concern for ischemic changes of toes of R foot, but vascular surgery is aware and monitoring. Has required PRN oxy 3-4 times daily for pain.  The primary issue holding him in the hospital at this point is that his wound vac is draining more than he would be able to accommodate with a home vac. Discussed with Dr. Carlis Abbott, vascular, who was comfortable sending him home with the present amount of drainage. However, also discussed with Case Management and it seems that  it would be impossible to get him more than one 355mL canister per day and that even if he were to get Medicaid, this would not make any difference.  - VVS following, appreciate recommendations - Compression stocking - BID fasciotomy wound dressing changes - Tylenol 1g q6h - Gabapentin 300mg  TID - Oxycodone 5-15mg  q4 PRN  Metastatic urothelial carcinoma of the bladder Palliative Care Biopsy results consistent with metastatic carcinoma, morphologically compatible with urothelial carcinoma. Discussed possible palliative family meeting with patient and he believes this will be helpful. Would like for his sister, Carmon Sails [phone], and ex-wife Braylee Bosher [phone] to be present for meeting. - Oncology following - Outpt follow-up with onc + urology - Palliative care on board  Hyponatremia Hyperkalemia Hypothyroidism Na 126> 126. Per nephro, have discontinued IVF. Will continue salt tablet until Na reaches 130. K 4.6 > 4.6 Stable off Lokelma. Per note, nephrology believes hyponatremia may be secondary to hypothyroidism. Began thyroid hormone replacement two days ago. Nephrology signed off.  - Continue salt tablet - Daily RFP - Continue levothyroxine 61mcg daily  AKI Thought to be ischemic ATN 2/2 intravascular volume depletion/sepsis. S/p IVF resuscitation and albumin. Cr 2.16> 2.21. Baseline 0.8-0.9.  - Nephro signed off   UTI Continue cefadroxil 500mg  BID. Afebrile.  - Day 5/7 abx  Anemia Hbg 8.8 > 8.9. Stable. Received 1 u PRBC on 11/29. Ferritin 63.  - Daily CBC - Will add ferrous sulfate 325mg  daily  Constipation Reports normal consistency BM yesterday.  - Continue Colace BID PRN  Pressure ulcer of cocygeal region, stage 2 Per nursing skin care protocols  Protein-calorie malnutrition, severe Supplmentation per nutrition recs - Ensure BID - Juven powder BID - Multivitamin  PAD - Continue atorvastatin 40mg  daily - Continue  Eliquis 5mg  BID   FEN/GI:  Regular PPx: On Eliquis Dispo:Home with home health  pending clinical improvement .   Subjective:  Arthur Mcmahon reports feeling "alright" this morning.  He says that he "did not know what to think" about his discussion with oncology yesterday.  He does think that a in-depth conversation with palliative care and his family would be helpful in clarifying goals of care.  He makes note that he would like for his sister and his ex-wife to be present for this conversation.  Objective: Temp:  [97.7 F (36.5 C)-98.3 F (36.8 C)] 98.3 F (36.8 C) (12/01 0903) Pulse Rate:  [77-95] 95 (12/01 0903) Resp:  [14-20] 16 (12/01 0903) BP: (111-139)/(71-92) 116/71 (12/01 0903) SpO2:  [98 %-100 %] 98 % (12/01 0903) Weight:  [124.3 kg] 124.3 kg (12/01 0345) Physical Exam: General: Chronically ill appearing man with temporal wasting, NAD.  Cardiovascular: RRR, no murmur, LE pulses difficult to palpate due to edema Respiratory: Lungs clear throughout, nl WOB on RA Abdomen: Soft, non-tender Extremities: 3+ pitting edema of RLE, dressings clean and dry. Wound vac in place to R groin. Surrounding area clean and dry.   Laboratory: Recent Labs  Lab 08/02/21 0446 08/02/21 2113 08/03/21 0131 08/04/21 0605  WBC 12.3*  --  12.6* 13.0*  HGB 7.4* 9.0* 8.8* 8.9*  HCT 23.4* 28.1* 27.2* 27.5*  PLT 523*  --  463* 493*   Recent Labs  Lab 07/31/21 1840 07/31/21 2123 08/01/21 0452 08/01/21 1619 08/02/21 0446 08/03/21 0131 08/04/21 0605  NA  --  124* 124*   < > 122* 126* 126*  K  --  6.0* 5.7*   < > 5.1 4.6 4.6  CL  --  90* 93*   < > 90* 95* 93*  CO2  --  23 23   < > 24 21* 22  BUN  --  59* 61*   < > 65* 61* 60*  CREATININE  --  2.18* 2.43*   < > 2.45* 2.16* 2.21*  CALCIUM  --  9.4 9.4   < > 9.5 9.2 9.3  PROT 6.4* 6.1* 5.9*  --   --   --   --   BILITOT 0.6 0.4 0.5  --   --   --   --   ALKPHOS 91 82 81  --   --   --   --   ALT 9 9 9   --   --   --   --   AST 26 19 19   --   --   --   --   GLUCOSE  --  80  77   < > 90 71 80   < > = values in this interval not displayed.     Imaging/Diagnostic Tests: No new imaging/tests.   Eppie Gibson, MD 08/04/2021, 9:14 AM PGY-1, [place] Intern pager: [phone], text pages welcome

## 2021-08-04 NOTE — Progress Notes (Signed)
FPTS Brief Progress Note  S:In to see patient but he was asleep. Did not awaken. Discussed with RN who voiced no concerns.    O: BP 115/73 (BP Location: Right Arm)   Pulse 77   Temp 97.7 F (36.5 C) (Oral)   Resp 18   Ht 6\' 2"  (1.88 m)   Wt 92.5 kg   SpO2 100%   BMI 26.19 kg/m   Gen: chronically ill male laying in bed, sleeping comfortably   A/P: Stable Continue plan per day team.   - Orders reviewed. Labs for AM ordered, which was adjusted as needed.   [name], DO 08/04/2021, 12:09 AM PGY-2, [place] Night Resident  Please page [phone] with questions.

## 2021-08-04 NOTE — Progress Notes (Signed)
Patient ID: QUEST TAVENNER, male   DOB: June 23, 1966, 55 y.o.   MRN: [id number]    Progress Note from the Palliative Medicine Team at [facility]   Patient Name: SENON NIXON        Date: 08/04/2021 DOB: Feb 06, 1966  Age: 55 y.o. MRN#: [id number] Attending Physician: Lenoria Chime, MD Primary Care Physician: Shary Key, DO Admit Date: 07/13/2021   Medical records reviewed   55 y.o. male   admitted on 07/13/2021 with  history of recent surgery for critical limb ischemia of the right lower extremity where patient had underwent endarterectomy and fistulotomy with thrombectomy presently on Eliquis had followed up with urology clinic for bladder mass found during imaging  New diagnosis of urothelial carcinoma, a CTA aortobifemoral was obtained on 06/18/2021 which showed numerous bulky retroperitoneal, iliac, and pelvic sidewall lymph nodes.  A CT hematuria work-up was obtained on 07/01/2021 which showed a large (7 cm) irregular right sided bladder mass likely obstructing the right ureter with marked right-sided hydroureteronephrosis, extensive retroperitoneal and pelvic adenopathy.  A CT of the chest with contrast was performed on 07/01/2021 which showed pathologic adenopathy within the left supraclavicular region, left axilla, and left periaortic region consistent with metastatic disease.  There are also borderline enlarged lymph nodes within the retrocrural region and retroperitoneum consistent with metastatic disease.  Patient tells me that he has lost a significant amount of weight up to 70 pounds, and he has had noted on and off bleeding from his penis for years.   Today is day 22 of this hospitalization and patient has had continued slow physical and functional decline within the context of full medical support.  Patient faces ongoing treatment option decisions, advanced directive decisions and anticipatory care needs.  This NP visited patient at the bedside as a follow up to   yesterday's [place].  Continued conversation with Harvel regarding his current medical situation.  He understands the seriousness of his medical situation and is coming to terms with the likely associated poor prognosis.  Per oncology patient's disease is metastatic and not curable.  His decreased renal function decreases options for systemic treatment.  Further work-up and cancer treatment would occur outpatient at the cancer center.  Emotional support offered.  Questions and concerns addressed.    With patient's permission I spoke today with his sister and ex-wife, continued conversation update regarding current medical situation.    Education offered on hospice philosophy, hospice eligibility and residential hospice option.  All are concerned with the seriousness of his medical situation.  Discussed with patient the importance of continued conversation with family and the medical providers regarding overall plan of care and treatment options,  ensuring decisions are within the context of the patients values and GOCs.  Plan is to met on Monday 08-08-21 at 10 AM with his sister, ex-wife, and his best friend Ralene Cork.   Questions and concerns addressed   Discussed with treatment team   Total time spent on the unit was 65 minutes  Greater than 50% of the time was spent in counseling and coordination of care  Wadie Lessen NP  Palliative Medicine Team Team Phone # [phone] Pager [phone]

## 2021-08-04 NOTE — Progress Notes (Signed)
Patient premedicated for right leg fasciotomy dressing change. Performed dressing change, patient tolerated fairly, has requested not to have to ted hose applied at this time.  Will continue to monitor.

## 2021-08-04 NOTE — Progress Notes (Signed)
Follow up done on Medicaid application- Per notes from [facility]- they have spoken with pt and submitted Medicaid application to [facility] DSS on 07/22/21. [facility] will continue to follow. Medicaid application is pending at this time.

## 2021-08-04 NOTE — Progress Notes (Signed)
This chaplain attempted F/U spiritual care.  The Pt. is sleeping at the time of the visit.  The chaplain will try again through the following of the Pt. chart and relationship with the PMT.  Chaplain Sallyanne Kuster [phone]

## 2021-08-04 NOTE — Progress Notes (Signed)
LATE ENTRY  Spoke with Dr. Burr Medico, oncology. Primary team requesting prognostication as able and reasonable to help guide discharge planning. Dr. Burr Medico relays that she thinks he is a candidate for outpatient therapies. She estimates a life expectancy of up to 1-2 years with therapy or 2-3 months without.   Will relay this to primary team for planning purposes. PT recommending home health PT and intermittent supervision and assistance as needed.   [name], MD PGY-2, [facility] pager [phone]

## 2021-08-04 NOTE — Progress Notes (Addendum)
  Progress Note    08/04/2021 7:07 AM 22 Days Post-Op  Subjective:  sleeping and I did not wake him  Afebrile HR 70's-100's  007'M-226'J systolic 335% RA  Vitals:   08/03/21 2312 08/04/21 0345  BP: 115/73 (!) 139/92  Pulse: 77 91  Resp: 18 15  Temp: 97.7 F (36.5 C) 98 F (36.7 C)  SpO2: 100%     Physical Exam: General:  sleeping Lungs:  non labored Incisions:  bandage to RLE clean and dry   CBC    Component Value Date/Time   WBC 13.0 (H) 08/04/2021 0605   RBC 3.12 (L) 08/04/2021 0605   HGB 8.9 (L) 08/04/2021 0605   HCT 27.5 (L) 08/04/2021 0605   PLT 493 (H) 08/04/2021 0605   MCV 88.1 08/04/2021 0605   MCH 28.5 08/04/2021 0605   MCHC 32.4 08/04/2021 0605   RDW 15.7 (H) 08/04/2021 0605   LYMPHSABS 0.7 07/21/2021 0629   MONOABS 0.8 07/21/2021 0629   EOSABS 0.2 07/21/2021 0629   BASOSABS 0.0 07/21/2021 0629    BMET    Component Value Date/Time   NA 126 (L) 08/04/2021 0605   K 4.6 08/04/2021 0605   CL 93 (L) 08/04/2021 0605   CO2 22 08/04/2021 0605   GLUCOSE 80 08/04/2021 0605   BUN 60 (H) 08/04/2021 0605   CREATININE 2.21 (H) 08/04/2021 0605   CALCIUM 9.3 08/04/2021 0605   GFRNONAA 35 (L) 08/04/2021 0605   GFRAA >60 12/01/2019 2208    INR    Component Value Date/Time   INR 1.0 06/18/2021 1550     Intake/Output Summary (Last 24 hours) at 08/04/2021 0707 Last data filed at 08/04/2021 0347 Gross per 24 hour  Intake 600 ml  Output 1050 ml  Net -450 ml     Assessment/Plan:  55 y.o. male is s/p:  sharp excisional debridement of right groin and bilateral right lower leg fasciotomy incisions. Brisk multiphasic right PT Doppler signal. Wound VAC   22 Days Post-Op   -pt sleeping and I did not wake him.  His RLE bandage was changed this morning by RN.   His vac had 350cc output/ 24 hr period -supraclavicular LN biopsied was positive for metastatic carcinoma.  Appreciate oncology following.    Leontine Locket, PA-C [facility] [phone] 08/04/2021 7:07 AM  I have seen and evaluated the patient. I agree with the PA note as documented above.  Right groin VAC changed yesterday at bedside with no issues.  Continues to have serosanguineous drainage.  Output recorded 325 mL in the last 24 hours.  This wound is getting smaller.  Right lower extremity fasciotomy wounds changed this am.  Continue wet-to-dry Dakin's.  Worried about his right first, second, and fifth toes that are showing ischemic changes with dry gangrene.  There is really nothing further to do from a vascular standpoint.  We will monitor.  He has an excellent PT signal at the ankle.  [name], MD [facility] [place] Office: [phone]

## 2021-08-04 NOTE — Progress Notes (Addendum)
HEMATOLOGY-ONCOLOGY PROGRESS NOTE  SUBJECTIVE: Arthur Mcmahon is resting in bed quietly at time of visit.  He reports right foot pain. Reports a poor appetite. He states that he has been worrying about where he will go upon hospital discharge and plans moving forward.  No family at the bedside.  REVIEW OF SYSTEMS:   Constitutional: Denies fevers, chills  Eyes: Denies blurriness of vision Ears, nose, mouth, throat, and face: Denies mucositis or sore throat Respiratory: Denies cough, dyspnea or wheezes Cardiovascular: Denies palpitation, chest discomfort Gastrointestinal:  Denies nausea, heartburn or change in bowel habits Skin: Denies abnormal skin rashes Lymphatics: Denies new lymphadenopathy or easy bruising Neurological:Denies numbness, tingling or new weaknesses Behavioral/Psych: Mood is stable, no new changes  Extremities: No lower extremity edema All other systems were reviewed with the patient and are negative.  I have reviewed the past medical history, past surgical history, social history and family history with the patient and they are unchanged from previous note.   PHYSICAL EXAMINATION: ECOG PERFORMANCE STATUS: 3 - Symptomatic, >50% confined to bed  Vitals:   08/04/21 0903 08/04/21 1056  BP: 116/71 123/78  Pulse: 95 (!) 104  Resp: 16 16  Temp: 98.3 F (36.8 C) 98 F (36.7 C)  SpO2: 98% 96%   Filed Weights   08/02/21 0454 08/03/21 0525 08/04/21 0345  Weight: 90.7 kg 92.5 kg 124.3 kg    Intake/Output from previous day: 11/30 0701 - 12/01 0700 In: 600 [P.O.:600] Out: 1225 [Urine:900; Drains:325]  GENERAL: Chronically ill-appearing, no distress SKIN: Wound VAC right groin, right heel dressing in place EYES: normal, Conjunctiva are pink and non-injected, sclera clear LYMPH: Comparable left supraclavicular and left axillary lymphadenopathy.  He had palpable inguinal. LUNGS: clear to auscultation and percussion with normal breathing effort HEART: regular rate & rhythm  and no murmurs and no lower extremity edema ABDOMEN:abdomen soft, non-tender and normal bowel sounds  NEURO: alert & oriented x 3   LABORATORY DATA:  I have reviewed the data as listed CMP Latest Ref Rng & Units 08/04/2021 08/03/2021 08/02/2021  Glucose 70 - 99 mg/dL 80 71 90  BUN 6 - 20 mg/dL 60(H) 61(H) 65(H)  Creatinine 0.61 - 1.24 mg/dL 2.21(H) 2.16(H) 2.45(H)  Sodium 135 - 145 mmol/L 126(L) 126(L) 122(L)  Potassium 3.5 - 5.1 mmol/L 4.6 4.6 5.1  Chloride 98 - 111 mmol/L 93(L) 95(L) 90(L)  CO2 22 - 32 mmol/L 22 21(L) 24  Calcium 8.9 - 10.3 mg/dL 9.3 9.2 9.5  Total Protein 6.5 - 8.1 g/dL - - -  Total Bilirubin 0.3 - 1.2 mg/dL - - -  Alkaline Phos 38 - 126 U/L - - -  AST 15 - 41 U/L - - -  ALT 0 - 44 U/L - - -    Lab Results  Component Value Date   WBC 13.0 (H) 08/04/2021   HGB 8.9 (L) 08/04/2021   HCT 27.5 (L) 08/04/2021   MCV 88.1 08/04/2021   PLT 493 (H) 08/04/2021   NEUTROABS 6.7 07/21/2021    DG Chest 2 View  Result Date: 07/31/2021 CLINICAL DATA:  Sepsis, pneumonia. EXAM: CHEST - 2 VIEW COMPARISON:  06/30/2021. FINDINGS: The heart size and mediastinal contours are within normal limits. There are small bilateral pleural effusions with atelectasis or infiltrate at the lung bases, best seen on lateral view. No pneumothorax is seen. No acute osseous abnormality. IMPRESSION: Small bilateral pleural effusions with atelectasis or infiltrate. Electronically Signed   By: Brett Fairy M.D.   On: 07/31/2021 21:00  US RENAL  Result Date: 08/01/2021 CLINICAL DATA:  The acute kidney injury EXAM: RENAL / URINARY TRACT ULTRASOUND COMPLETE COMPARISON:  CT abdomen pelvis 07/01/2021 FINDINGS: Right Kidney: Renal measurements: 10.7 x 5.1 x 5.3 cm = volume: 149 mL. Mild hydronephrosis. Cortical thickness within normal limits. Left Kidney: Renal measurements: 12.1 x 6.1 x 5.9 cm = volume: 231 mL. Echogenicity within normal limits. No hydronephrosis. The 2.5 cm simple cyst present unit  kidney. Bladder: Unable to evaluate due to collapse configuration. Other: Incidental note of small right pleural effusion. IMPRESSION: Unchanged mild right hydronephrosis. Electronically Signed   By: Miachel Roux M.D.   On: 08/01/2021 07:49   [place] CORE BIOPSY (LYMPH NODES)  Result Date: 08/01/2021 INDICATION: History of urothelial cancer.  Enlarged supraclavicular lymph nodes. EXAM: ULTRASOUND-GUIDED LEFT SUPRACLAVICULAR LYMPH NODE BIOPSY COMPARISON:  CT chest, 07/02/2019. MEDICATIONS: None ANESTHESIA/SEDATION: Local anesthetic was administered. COMPLICATIONS: None immediate. TECHNIQUE: Informed written consent was obtained from the patient after a discussion of the risks, benefits and alternatives to treatment. Questions regarding the procedure were encouraged and answered. Initial ultrasound scanning demonstrated pathologically-enlarged LEFT supraclavicular lymph nodes. An ultrasound image was saved for documentation purposes. The procedure was planned. A timeout was performed prior to the initiation of the procedure. The operative was prepped and draped in the usual sterile fashion, and a sterile drape was applied covering the operative field. A timeout was performed prior to the initiation of the procedure. Local anesthesia was provided with 1% lidocaine. Under direct ultrasound guidance, an 18 gauge core needle device was utilized to obtain to obtain 3 core needle biopsies of the LEFT supraclavicular lymph nodes. The samples were placed in saline and submitted to pathology. The needle was removed and hemostasis was achieved with manual compression. Post procedure scan was negative for significant hematoma. A dressing was placed. The patient tolerated the procedure well without immediate postprocedural complication. IMPRESSION: Successful ultrasound guided biopsy of LEFT supraclavicular lymph nodes. Michaelle Birks, MD [facility] Radiology Electronically Signed    By: Michaelle Birks M.D.   On: 08/01/2021 16:11   VAS [place] LOWER EXTREMITY VENOUS (DVT)  Result Date: 07/24/2021  Lower Venous DVT Study Patient Name:  Arthur Mcmahon  Date of Exam:   07/23/2021 Medical Rec #: [id number]       Accession #:    [id number] Date of Birth: [birth year]       Patient Gender: M Patient Age:   50 years Exam Location:  [facility] Procedure:      VAS [place] LOWER EXTREMITY VENOUS (DVT) Referring Phys: Gwyndolyn Saxon HENSEL --------------------------------------------------------------------------------  Indications: Pitting edema.  Risk Factors: Recent vascular intervention including fasciotomy, followed by debridement of fasciotomies and wound vac placement in right groin. Limitations: Bandages, right groin wound vac, poor ultrasound/tissue interface, and significant edema. Comparison Study: No prior study on file Performing Technologist: Sharion Dove RVS  Examination Guidelines: A complete evaluation includes B-mode imaging, spectral Doppler, color Doppler, and power Doppler as needed of all accessible portions of each vessel. Bilateral testing is considered an integral part of a complete examination. Limited examinations for reoccurring indications may be performed as noted. The reflux portion of the exam is performed with the patient in reverse Trendelenburg.  +---------+---------------+---------+-----------+----------+-------------------+ RIGHT    CompressibilityPhasicitySpontaneityPropertiesThrombus Aging      +---------+---------------+---------+-----------+----------+-------------------+ CFV                     No       No  patent by color and                                                       Doppler             +---------+---------------+---------+-----------+----------+-------------------+ FV Prox                 No       No                   patent by color and                                                       Doppler              +---------+---------------+---------+-----------+----------+-------------------+ FV Mid                  No       No                   patent by color and                                                       Doppler             +---------+---------------+---------+-----------+----------+-------------------+ FV Distal               No       No                   patent by color and                                                       Doppler             +---------+---------------+---------+-----------+----------+-------------------+ PFV                     No       No                   patent by color and                                                       Doppler             +---------+---------------+---------+-----------+----------+-------------------+ POP      Full                                                             +---------+---------------+---------+-----------+----------+-------------------+  PTV                                                   bandages/open                                                             wounds              +---------+---------------+---------+-----------+----------+-------------------+ PERO                                                  bandages/open                                                             wounds              +---------+---------------+---------+-----------+----------+-------------------+   +----+---------------+---------+-----------+----------+--------------+ LEFTCompressibilityPhasicitySpontaneityPropertiesThrombus Aging +----+---------------+---------+-----------+----------+--------------+ CFV Full           Yes      Yes                                 +----+---------------+---------+-----------+----------+--------------+     Summary: RIGHT: - There is no evidence of deep vein thrombosis in the lower extremity. However, portions of this examination  were limited- see technologist comments above.  LEFT: - No evidence of common femoral vein obstruction.  *See table(s) above for measurements and observations. Electronically signed by Jamelle Haring on 07/24/2021 at 11:58:29 AM.    Final     ASSESSMENT AND PLAN: This is a 55 year old male with 1.  Metastatic urothelial carcinoma 2.  Lymphatic obstruction 3.  Hyponatremia 4.  Severe protein calorie malnutrition 5.  Normocytic anemia 6.  Leukocytosis and thrombocytosis, likely reactive 7.  AKI 8.  Mild hypercalcemia (elevated corrected calcium)   -Left supraclavicular lymph node biopsy results discussed with the patient.  Biopsy results confirm metastatic malignancy. -We further discussed with the patient that we will cancel his outpatient appointment with Dr. Alen Blew that was scheduled for 12/2 and reschedule this once he is discharged from the hospital.  He will have further discussion with Dr. Alen Blew regarding systemic treatment options for his malignancy. -For his decreased appetite, will start him on mirtazapine 15 mg at bedtime. -He will continue ongoing goals of care discussion with the palliative care team. -Physical therapy is recommending home health PT.  I am concerned that the patient does not have a good support system at home.  He may benefit from placement at SNF if recommended by therapy. -He will continue management of his wounds per vascular surgery.  Future Appointments  Date Time Provider [place]  08/05/2021  2:00 PM Wyatt Portela, MD [facility] None  08/09/2021  2:40 PM [name], MD [facility]      LOS: 22  days   Mikey Bussing, DNP, AGPCNP-BC, AOCNP 08/04/21   Addendum  I have seen the patient, examined him. I agree with the assessment and and plan and have edited the notes.   I reviewed his lymph node biopsy results, which showed metastatic bladder cancer.  I reviewed his case with my partner Dr. Alen Blew, plans to see him to discuss  chemotherapy on his follow-up.  His overall prognosis is poor due to the metastatic disease, he does not have other organ involvement such as liver or bone metastasis, so his life expectancy is probably more than 6 months if he does not have other severe complications.  I do not plan therapy in hospital.  Please let us know when he is ready to be discharged,so we can arrange his follow-up appointment. Will let him try mirtazapine for low appetite.   Truitt Merle  08/04/2021

## 2021-08-05 ENCOUNTER — Inpatient Hospital Stay: Payer: MEDICAID | Admitting: Oncology

## 2021-08-05 LAB — CULTURE, BLOOD (ROUTINE X 2)
Culture: NO GROWTH
Culture: NO GROWTH
Special Requests: ADEQUATE
Special Requests: ADEQUATE

## 2021-08-05 LAB — CBC
HCT: 29.2 % — ABNORMAL LOW (ref 39.0–52.0)
Hemoglobin: 9.3 g/dL — ABNORMAL LOW (ref 13.0–17.0)
MCH: 28.4 pg (ref 26.0–34.0)
MCHC: 31.8 g/dL (ref 30.0–36.0)
MCV: 89.3 fL (ref 80.0–100.0)
Platelets: 478 10*3/uL — ABNORMAL HIGH (ref 150–400)
RBC: 3.27 MIL/uL — ABNORMAL LOW (ref 4.22–5.81)
RDW: 15.7 % — ABNORMAL HIGH (ref 11.5–15.5)
WBC: 12.6 10*3/uL — ABNORMAL HIGH (ref 4.0–10.5)
nRBC: 0 % (ref 0.0–0.2)

## 2021-08-05 LAB — RENAL FUNCTION PANEL
Albumin: 2.1 g/dL — ABNORMAL LOW (ref 3.5–5.0)
Anion gap: 8 (ref 5–15)
BUN: 70 mg/dL — ABNORMAL HIGH (ref 6–20)
CO2: 21 mmol/L — ABNORMAL LOW (ref 22–32)
Calcium: 9.8 mg/dL (ref 8.9–10.3)
Chloride: 98 mmol/L (ref 98–111)
Creatinine, Ser: 2.6 mg/dL — ABNORMAL HIGH (ref 0.61–1.24)
GFR, Estimated: 28 mL/min — ABNORMAL LOW (ref >60)
Glucose, Bld: 83 mg/dL (ref 70–99)
Phosphorus: 4.6 mg/dL (ref 2.5–4.6)
Potassium: 5 mmol/L (ref 3.5–5.1)
Sodium: 127 mmol/L — ABNORMAL LOW (ref 135–145)

## 2021-08-05 MED ORDER — SODIUM CHLORIDE 0.9 % IV BOLUS
1000.0000 mL | Freq: Once | INTRAVENOUS | Status: AC
  Administered 2021-08-05: 1000 mL via INTRAVENOUS

## 2021-08-05 MED ORDER — POLYETHYLENE GLYCOL 3350 17 G PO PACK
17.0000 g | PACK | Freq: Every day | ORAL | Status: DC
  Administered 2021-08-05 – 2021-08-06 (×2): 17 g via ORAL
  Filled 2021-08-05: qty 1

## 2021-08-05 NOTE — Progress Notes (Signed)
Performed dressing change to fasciotomy sites on right lower leg. Patient tolerated procedure well. Patient was premedicated and had no pain complaint.

## 2021-08-05 NOTE — Progress Notes (Signed)
FPTS Brief Note Reviewed patient's vitals, recent notes.  Vitals:   08/05/21 0735 08/05/21 1638  BP: 109/74 120/85  Pulse: 83 85  Resp: 10 (!) 21  Temp: 98.6 F (37 C) 98.8 F (37.1 C)  SpO2: 96% 98%   At this time, no change in plan from day progress note.    Alcus Dad, MD Page [phone] with questions about this patient.

## 2021-08-05 NOTE — Progress Notes (Signed)
Patient request I skip fasciotomy dressing change tonight. He has been medicated nevertheless declines dressing change but says he will have it done in the morning. Will continue to encourage promote all aspects of the care plan.

## 2021-08-05 NOTE — TOC Progression Note (Signed)
Transition of Care (TOC) - Progression Note  Marvetta Gibbons RN, BSN Transitions of Care Unit 4E- RN Case Manager See Treatment Team for direct phone # Marvetta Gibbons RN, BSN Transitions of Care Unit 4E- RN Case Manager See Treatment Team for direct phone #    Patient Details  Name: Arthur Mcmahon MRN: [id number] Date of Birth: 1965/11/29  Transition of Care [facility]) CM/SW Contact  Arthur Client Romeo Rabon, RN Phone Number: 08/05/2021, 2:59 PM  Clinical Narrative:    Noted vascular note regarding no further intervention from their standpoint and home vac needs. Have reached out to hospital liaison Olivia Mackie w/ [facility] whom pt had been approved prior to admit for a home wound vac for wound care needs. Olivia Mackie has been following case for extra home vac canister needs.  In review of chart notes for the home vac needs, Olivia Mackie is concerned that home vac is being used for lymphatic leak management which is not an approved use for a home VAC. She is going to reach out to her team lead support for further clarification and support. Will also ask for any further advise on how to best support this pt and his needs going forward.  Will need to await word from [facility] to see if pt will continue to be approved for home [facility] use prior to discharge.  TOC will continue to follow for transition needs and follow up with [facility] regarding home [facility]     Expected Discharge Plan: [place] Barriers to Discharge: Continued Medical Work up  Expected Discharge Plan and Services Expected Discharge Plan: [place] In-house Referral: Clinical Social Work Discharge Planning Services: CM Consult Post Acute Care Choice: Home Health, Resumption of Svcs/PTA Provider Living arrangements for the past 2 months: Single Family Home                 DME Arranged: Vac, Bedside commode DME Agency: [employer], Soyla Murphy Date DME Agency Contacted: 07/18/21 Time DME Agency Contacted: 1100 Representative spoke  with at DME Agency: Olivia Mackie w/ [facility] HH Arranged: RN, PT [facility] Agency: [place] ([place]) Date HH Agency Contacted: 07/18/21 Time HH Agency Contacted: 1000 Representative spoke with at [place]: [place] ([facility]) Interventions    Readmission Risk Interventions Readmission Risk Prevention Plan 07/04/2021 06/22/2021  Post Dischage Appt - Complete  Medication Screening - Complete  Transportation Screening Complete Complete  PCP or Specialist Appt within 5-7 Days Complete -  Home Care Screening Complete -  Medication Review (RN CM) Complete -  Some recent data might be hidden

## 2021-08-05 NOTE — Progress Notes (Addendum)
Family Medicine Teaching Service Daily Progress Note Intern Pager: [phone]  Patient name: Arthur Mcmahon Medical record number: [id number] Date of birth: 1966-03-19 Age: 55 y.o. Gender: male  Primary Care Provider: Shary Key, DO Consultants: VVS, Heme/Onc, Palliative Code Status: Partial, DNI and would only desire one round of ACLS  Pt Overview and Major Events to Date:   11/9- Admitted s/p vasc surg 11/28- US guided left supraclavicular lymph node biopsy- IR 11/29- Patient made Partial Code  Assessment and Plan:  Mr. Fjelstad is a 55 y.o. male currently admitted with vascular complications and lymphedema due to metastatic bladder cancer. He was admitted 11/9 postoperatively after right common femoral endarterectomy, lower extremity thrombectomy and fasciotomies, now with a wound VAC placement. PMHx includes metastatic Stage IVA [id number] urothelial carcinoma of the bladder, (right hydroureteronephrosis) with widely metastatic lymphadenopathy (extensive retroperitoneal, pelvic, left supraclavicular, left axilla, left para-aortic, retrocrural adenopathy), right lower extremity ischemia s/p vascular surgery above, RLE edema (caused by extrinsic compression of right common femoral vein adenopathy), normocytic anemia, chronic hyponatremia, severe protein calorie malnutrition, and tobacco use.   POD #23, s/p debridement of fasciotomy wounds, wound vac in place Difficult disposition Wound vac drainage 380mL, approaching discharge goal of <363mL/day. Ischemic changes of R toes stable from previous. Continues to require 10-15mg  oxycodone ~4x daily.  Discussed possibility of him dumping his wound vac canisters if they become overfilled and he feels this is something he could do. Will discuss this possibility with case management.  He has submitted Medicaid application, pending approval.  - VVS continues to follow - Compression stocking - Continue BID fasciotomy dressing changes - Tylenol 1g  q6h - Gabapentin 300mg  TID - Oxycodone 5-15mg  q4 PRN  Metastatic urothelial carcinoma of the bladder Palliative care Has been seen by palliative care, says that this meeting went well.  Scheduled for a family meeting on 12/5 10am with sister, ex-wife, best friend. -Family meeting on 12/5 as above  Hyponatremia, improving Hyperkalemia, resolved Na 126>127. K 4.6>5.0. Lokelma had been held for past 48 hours due to issues with diarrhea.  - Will continue to monitor BMP, may require Lokelma if K continues to rise  AKI Thought to be ischemic ATN secondary to intravascular volume depletion/sepsis.  Creatinine up today 2.21 > 2.60.  Patient with poor p.o. intake. Consider that this may reflect a pre-renal component on top of the intrinsic ischemic ATN given worsening of Cr after discontinuation of IVF. Previously received IV fluids and albumin.  Albumin 1.6 > 2.6 > 2.1. - Will bolus 1L IVF - Heme/onc added mirtazipine, hoping this will help to increase PO intake - If Cr continues to worsen, will obtain renal US to follow-up hydronephrosis  - AM BMP as above  UTI Continue cefadroxil 500mg  BID. Remains Afebrile. - Day 6/7 abx  Anemia Hgb 8.9>9.3. Stable. Added ferrous sulfate 325mg  daily yesterday. - Continue to trend CBC  Constipation Uncertain about last BM.  - Will schedule MiraLAX while he is on higher doses of opioids  Pressure ulcer of coccygeal region, stage 2 Per nursing skin care protocol  Protein-calorie malnutrition, severe Supplementation per nutrition recommendations -Ensure twice daily -Juven powder twice daily -Multivitamin  PAD -Continue atorvastatin 40 mg daily -Continue Eliquis 5 mg twice daily  FEN/GI: Regular diet PPx: On Eliquis Dispo:Home with home health   pending decreased wound VAC output .   Subjective:  Mr. Sedlacek reports feeling "about the same" this morning.  He says that he "thinks" that someone talk to him about  dumping his wound VAC at home but  does not recall the conversation.  He would be amenable to learning how to do this if it means getting to go home though he expresses some hesitation about being able to care for himself at home.  It seems he does not have a great support system of folks who can help him with this task.  Objective: Temp:  [98 F (36.7 C)-98.6 F (37 C)] 98.5 F (36.9 C) (12/02 0539) Pulse Rate:  [82-104] 90 (12/02 0539) Resp:  [14-17] 15 (12/02 0539) BP: (109-123)/(71-79) 115/76 (12/02 0539) SpO2:  [95 %-98 %] 95 % (12/02 0539) Physical Exam: General: Chronically appearing, temporal wasting, NAD Cardiovascular: Regular rate, regular rhythm, no murmur/rub/gallop, difficult to palpate LE pulses due to marked edema Respiratory: Normal work of breathing on room air Abdomen: Soft, suprapubic tenderness Extremities: Pitting edema unchanged from previous, compression stocking on left lower extremity.  Wound VAC in place  Laboratory: Recent Labs  Lab 08/03/21 0131 08/04/21 0605 08/05/21 0222  WBC 12.6* 13.0* 12.6*  HGB 8.8* 8.9* 9.3*  HCT 27.2* 27.5* 29.2*  PLT 463* 493* 478*   Recent Labs  Lab 07/31/21 1840 07/31/21 2123 08/01/21 0452 08/01/21 1619 08/03/21 0131 08/04/21 0605 08/05/21 0222  NA  --  124* 124*   < > 126* 126* 127*  K  --  6.0* 5.7*   < > 4.6 4.6 5.0  CL  --  90* 93*   < > 95* 93* 98  CO2  --  23 23   < > 21* 22 21*  BUN  --  59* 61*   < > 61* 60* 70*  CREATININE  --  2.18* 2.43*   < > 2.16* 2.21* 2.60*  CALCIUM  --  9.4 9.4   < > 9.2 9.3 9.8  PROT 6.4* 6.1* 5.9*  --   --   --   --   BILITOT 0.6 0.4 0.5  --   --   --   --   ALKPHOS 91 82 81  --   --   --   --   ALT 9 9 9   --   --   --   --   AST 26 19 19   --   --   --   --   GLUCOSE  --  80 77   < > 71 80 83   < > = values in this interval not displayed.    Imaging/Diagnostic Tests: No new imaging, tests  Eppie Gibson, MD 08/05/2021, 6:53 AM PGY-1, [place] Intern pager: [phone], text  pages welcome

## 2021-08-05 NOTE — Progress Notes (Signed)
FPTS Brief Note Reviewed patient's vitals, recent notes.  Vitals:   08/04/21 1939 08/04/21 2319  BP: 109/79 119/74  Pulse: 82 85  Resp: 14 17  Temp: 98.2 F (36.8 C) 98.6 F (37 C)  SpO2: 98% 97%   141mL of drain output recorded over past 24h. At this time, no change in plan from day progress note.   Alcus Dad, MD Page [phone] with questions about this patient.

## 2021-08-05 NOTE — Progress Notes (Signed)
Physical Therapy Treatment Patient Details Name: Arthur Mcmahon MRN: [id number] DOB: 1966-03-30 Today's Date: 08/05/2021   History of Present Illness Pt is a 55 y.o. male admitted on 07/13/21 for necrosis and lymphatic leakage of RLE. S/p debridement of fasciotomy incisions and wound VAC placement of groin incision on 11/9. S/p L supraclavicular lymph node biopsy 11/28. PMH includes metastatic bladder cancer with associated R hydroureteronephrosis and hematuria, normocytic anemia, chronic hyponatremia, severe protein calorie malnutrition, tobacco use, PAD, HTN; of note, pt s/p right thrombectomy and 4 compartment fasciotomies on 06/19/21.    PT Comments    Pt is slowly progressing with mobility. Today's session focused on transfer training, increasing standing tolerance, and pt education. Pt continues to be limited by pain, decreased strength, decreased activity tolerance, and decreased functional mobility. Pt requires heavy mod A +2 to stand from elevated bed frame into Stedy standing frame; poor tolerance to maintaining upright posture with +2 assist. Updating discharge recommendation to SNF, however, pt aware this may not be an option with Medicaid pending. If pt were to return home, will require lift equipment and assist +2, will likely be limited to bed/wheelchair level activity. Will continue to follow acutely to address established goals.     Recommendations for follow up therapy are one component of a multi-disciplinary discharge planning process, led by the attending physician.  Recommendations may be updated based on patient status, additional functional criteria and insurance authorization.  Follow Up Recommendations  Skilled nursing-short term rehab (<3 hours/day)     Assistance Recommended at Discharge Frequent or [facility] bed;Other (comment) ([place] lift, if home)    Recommendations for Other Services       Precautions  / Restrictions Precautions Precautions: Fall;Other (comment) Precaution Comments: wound vac right groin area; bowel incontinence Restrictions Weight Bearing Restrictions: No     Mobility  Bed Mobility Overal bed mobility: Needs Assistance Bed Mobility: Supine to Sit;Sit to Supine     Supine to sit: Mod assist;HOB elevated;+2 for physical assistance Sit to supine: HOB elevated;Mod assist;+2 for physical assistance   General bed mobility comments: Mod A +2 for trunk elevation and BLE advancement, increased time and effort, able to use bedrail to assist    Transfers Overall transfer level: Needs assistance Equipment used: Ambulation equipment used Transfers: Sit to/from Stand Sit to Stand: From elevated surface;+2 physical assistance;Mod assist           General transfer comment: Stood from EOB into [place] with Mod A+2 for elevation. Bed was elevated. Cues for hand placement, upright posture, to straighten knees, and for eccentric control when sitting. Able to tolerate 2 short bouts of standing in [place].    Ambulation/Gait                   Stairs             Wheelchair Mobility    Modified Rankin (Stroke Patients Only)       Balance Overall balance assessment: Needs assistance Sitting-balance support: Bilateral upper extremity supported;Feet supported Sitting balance-Leahy Scale: Fair Sitting balance - Comments: BUE support in sitting and min guard for safety   Standing balance support: Bilateral upper extremity supported Standing balance-Leahy Scale: Poor Standing balance comment: heavy reliance on BUE support for stability                            Cognition Arousal/Alertness: Awake/alert Behavior During Therapy: [facility] for tasks  assessed/performed Overall Cognitive Status: Impaired/Different from baseline Area of Impairment: Following commands;Safety/judgement;Awareness;Problem solving;Memory;Orientation                  Orientation Level: Disoriented to;Time   Memory: Decreased short-term memory Following Commands: Follows one step commands with increased time   Awareness: Emergent Problem Solving: Slow processing;Difficulty sequencing;Requires verbal cues;Requires tactile cues General Comments: Pt required max encouragement to participate in therapy today. Pt reports he often wakes up and doesn't know where he is. Required max cues during session.        Exercises      General Comments General comments (skin integrity, edema, etc.): Pt experiencing some nervousness using Stedy during session. HR up to 136. Assured pt and gave cues for pursed lip breathing. Discussed possbile SNF placement but pt likely not able to afford it. Talked about equipment and assistance pt will need if he returns home. Ex-wife present during session and supportive.      Pertinent Vitals/Pain Pain Assessment: 0-10 Pain Score: 6  Pain Location: RLE, groin area Pain Descriptors / Indicators: Aching;Discomfort;Grimacing;Pressure Pain Intervention(s): Monitored during session;Repositioned;Limited activity within patient's tolerance    Home Living                          Prior Function            PT Goals (current goals can now be found in the care plan section) Acute Rehab PT Goals Patient Stated Goal: to go home PT Goal Formulation: With patient Time For Goal Achievement: 08/16/21 Potential to Achieve Goals: Fair Additional Goals Additional Goal #1: Patient will be modified independent with manual wheelchair management, including self-propelling 100' to demonstrate household and community distances. Progress towards PT goals: Progressing toward goals    Frequency    Min 3X/week      PT Plan Discharge plan needs to be updated    Co-evaluation              AM-PAC PT "6 Clicks" Mobility   Outcome Measure  Help needed turning from your back to your side while in a flat bed without using  bedrails?: A Little Help needed moving from lying on your back to sitting on the side of a flat bed without using bedrails?: Total Help needed moving to and from a bed to a chair (including a wheelchair)?: Total Help needed standing up from a chair using your arms (e.g., wheelchair or bedside chair)?: Total Help needed to walk in hospital room?: Total Help needed climbing 3-5 steps with a railing? : Total 6 Click Score: 8    End of Session Equipment Utilized During Treatment: Gait belt Activity Tolerance: Patient limited by pain;Patient limited by fatigue Patient left: in bed;with call bell/phone within reach;with family/visitor present Nurse Communication: Mobility status PT Visit Diagnosis: Other abnormalities of gait and mobility (R26.89);Muscle weakness (generalized) (M62.81);Unsteadiness on feet (R26.81);Difficulty in walking, not elsewhere classified (R26.2);Pain Pain - Right/Left: Right Pain - part of body: Leg     Time: 9753-0051 PT Time Calculation (min) (ACUTE ONLY): 30 min  Charges:  $Therapeutic Activity: 23-37 mins                     [name], SPT    [name] 08/05/2021, 1:20 PM

## 2021-08-05 NOTE — Progress Notes (Addendum)
  Progress Note    08/05/2021 7:25 AM 23 Days Post-Op  Subjective:  sleeping - wakes easily.  C/o pain in his toes.  afebrile  Vitals:   08/04/21 2319 08/05/21 0539  BP: 119/74 115/76  Pulse: 85 90  Resp: 17 15  Temp: 98.6 F (37 C) 98.5 F (36.9 C)  SpO2: 97% 95%    Physical Exam: Cardiac:  regular Lungs:  non labored Incisions:       Extremities:  + right PT doppler signal; ischemic toes right foot      CBC    Component Value Date/Time   WBC 12.6 (H) 08/05/2021 0222   RBC 3.27 (L) 08/05/2021 0222   HGB 9.3 (L) 08/05/2021 0222   HCT 29.2 (L) 08/05/2021 0222   PLT 478 (H) 08/05/2021 0222   MCV 89.3 08/05/2021 0222   MCH 28.4 08/05/2021 0222   MCHC 31.8 08/05/2021 0222   RDW 15.7 (H) 08/05/2021 0222   LYMPHSABS 0.7 07/21/2021 0629   MONOABS 0.8 07/21/2021 0629   EOSABS 0.2 07/21/2021 0629   BASOSABS 0.0 07/21/2021 0629    BMET    Component Value Date/Time   NA 127 (L) 08/05/2021 0222   K 5.0 08/05/2021 0222   CL 98 08/05/2021 0222   CO2 21 (L) 08/05/2021 0222   GLUCOSE 83 08/05/2021 0222   BUN 70 (H) 08/05/2021 0222   CREATININE 2.60 (H) 08/05/2021 0222   CALCIUM 9.8 08/05/2021 0222   GFRNONAA 28 (L) 08/05/2021 0222   GFRAA >60 12/01/2019 2208    INR    Component Value Date/Time   INR 1.0 06/18/2021 1550     Intake/Output Summary (Last 24 hours) at 08/05/2021 0725 Last data filed at 08/05/2021 7353 Gross per 24 hour  Intake 290 ml  Output 1105 ml  Net -815 ml     Assessment/Plan:  55 y.o. male is s/p:  sharp excisional debridement of right groin and bilateral right lower leg fasciotomy incisions. Brisk multiphasic right PT Doppler signal. Wound VAC    23 Days Post-Op   -pt with + right PT doppler signal -dressing changed to fasciotomy sites and they are healing-continue dakins dressing changes -ischemic changes to toes-continue to monitor. -vac drainage at 305cc/24hr period.  Will need excess canisters to be discharged  home.    Leontine Locket, PA-C [facility] [phone] 08/05/2021 7:25 AM  I have seen and evaluated the patient. I agree with the PA note as documented above.  Wound VAC change to right groin with nursing today.  Output 305 mL in the last 24 hours.  Will need extra vac canisters for discharge.  No further plans for operative intervention.  Will need 3 times a week VAC change at home to right groin.  Also continue Dakin's wet-to-dry to fasciotomies.  We will arrange follow-up in office for close monitoring of his wounds at the time of discharge.  Has ischemic changes to his toes that we will have to watch closely.  He has a very brisk PT signal at the ankle this multiphasic and no further vascular invention from my standpoint.  [name], MD [facility] [place] Office: [phone]

## 2021-08-06 ENCOUNTER — Other Ambulatory Visit (HOSPITAL_COMMUNITY): Payer: Self-pay

## 2021-08-06 ENCOUNTER — Inpatient Hospital Stay (HOSPITAL_COMMUNITY): Payer: Medicaid Other

## 2021-08-06 LAB — RENAL FUNCTION PANEL
Albumin: 2 g/dL — ABNORMAL LOW (ref 3.5–5.0)
Albumin: 1.9 g/dL — ABNORMAL LOW (ref 3.5–5.0)
Anion gap: 7 (ref 5–15)
Anion gap: 8 (ref 5–15)
BUN: 78 mg/dL — ABNORMAL HIGH (ref 6–20)
BUN: 81 mg/dL — ABNORMAL HIGH (ref 6–20)
CO2: 24 mmol/L (ref 22–32)
CO2: 23 mmol/L (ref 22–32)
Calcium: 9.7 mg/dL (ref 8.9–10.3)
Calcium: 9.8 mg/dL (ref 8.9–10.3)
Chloride: 97 mmol/L — ABNORMAL LOW (ref 98–111)
Chloride: 97 mmol/L — ABNORMAL LOW (ref 98–111)
Creatinine, Ser: 3.09 mg/dL — ABNORMAL HIGH (ref 0.61–1.24)
Creatinine, Ser: 3.32 mg/dL — ABNORMAL HIGH (ref 0.61–1.24)
GFR, Estimated: 23 mL/min — ABNORMAL LOW (ref >60)
GFR, Estimated: 21 mL/min — ABNORMAL LOW (ref >60)
Glucose, Bld: 71 mg/dL (ref 70–99)
Glucose, Bld: 86 mg/dL (ref 70–99)
Phosphorus: 4.8 mg/dL — ABNORMAL HIGH (ref 2.5–4.6)
Phosphorus: 5.3 mg/dL — ABNORMAL HIGH (ref 2.5–4.6)
Potassium: 5.2 mmol/L — ABNORMAL HIGH (ref 3.5–5.1)
Potassium: 5.2 mmol/L — ABNORMAL HIGH (ref 3.5–5.1)
Sodium: 128 mmol/L — ABNORMAL LOW (ref 135–145)
Sodium: 128 mmol/L — ABNORMAL LOW (ref 135–145)

## 2021-08-06 LAB — URINALYSIS, COMPLETE (UACMP) WITH MICROSCOPIC
Bacteria, UA: NONE SEEN
Bilirubin Urine: NEGATIVE
Glucose, UA: NEGATIVE mg/dL
Ketones, ur: NEGATIVE mg/dL
Nitrite: NEGATIVE
Protein, ur: 100 mg/dL — AB
RBC / HPF: >50 RBC/hpf — ABNORMAL HIGH (ref 0–5)
Specific Gravity, Urine: 1.017 (ref 1.005–1.030)
WBC, UA: >50 WBC/hpf — ABNORMAL HIGH (ref 0–5)
pH: 5 (ref 5.0–8.0)

## 2021-08-06 MED ORDER — GABAPENTIN 100 MG PO CAPS
200.0000 mg | ORAL_CAPSULE | Freq: Three times a day (TID) | ORAL | Status: DC
  Administered 2021-08-06 – 2021-08-07 (×2): 200 mg via ORAL
  Filled 2021-08-06 (×2): qty 2

## 2021-08-06 MED ORDER — SODIUM ZIRCONIUM CYCLOSILICATE 5 G PO PACK
5.0000 g | PACK | Freq: Once | ORAL | Status: AC
  Administered 2021-08-06: 5 g via ORAL
  Filled 2021-08-06: qty 1

## 2021-08-06 MED ORDER — LACTATED RINGERS IV SOLN: INTRAVENOUS | Status: DC

## 2021-08-06 NOTE — Progress Notes (Addendum)
  Progress Note    08/06/2021 7:35 AM 24 Days Post-Op  Subjective:  sleeping-wakes easily.  Says he feels a little better today.   afebrile  Vitals:   08/06/21 0200 08/06/21 0439  BP: 102/62 124/84  Pulse:  90  Resp: 11 12  Temp: 98.3 F (36.8 C)   SpO2:      Physical Exam: Cardiac:  regular Lungs:  non labored Incisions:  dressing in place RLE-was changed last evening.  Extremities:  + doppler signal right PT;  toes appear unchanged from yesterday   CBC    Component Value Date/Time   WBC 12.6 (H) 08/05/2021 0222   RBC 3.27 (L) 08/05/2021 0222   HGB 9.3 (L) 08/05/2021 0222   HCT 29.2 (L) 08/05/2021 0222   PLT 478 (H) 08/05/2021 0222   MCV 89.3 08/05/2021 0222   MCH 28.4 08/05/2021 0222   MCHC 31.8 08/05/2021 0222   RDW 15.7 (H) 08/05/2021 0222   LYMPHSABS 0.7 07/21/2021 0629   MONOABS 0.8 07/21/2021 0629   EOSABS 0.2 07/21/2021 0629   BASOSABS 0.0 07/21/2021 0629    BMET    Component Value Date/Time   NA 128 (L) 08/06/2021 0124   K 5.2 (H) 08/06/2021 0124   CL 97 (L) 08/06/2021 0124   CO2 24 08/06/2021 0124   GLUCOSE 71 08/06/2021 0124   BUN 78 (H) 08/06/2021 0124   CREATININE 3.09 (H) 08/06/2021 0124   CALCIUM 9.7 08/06/2021 0124   GFRNONAA 23 (L) 08/06/2021 0124   GFRAA >60 12/01/2019 2208    INR    Component Value Date/Time   INR 1.0 06/18/2021 1550     Intake/Output Summary (Last 24 hours) at 08/06/2021 0735 Last data filed at 08/06/2021 0600 Gross per 24 hour  Intake 1783.65 ml  Output 350 ml  Net 1433.65 ml     Assessment/Plan:  55 y.o. male is s/p:  sharp excisional debridement of right groin and bilateral right lower leg fasciotomy incisions. Brisk multiphasic right PT Doppler signal. Wound VAC     24 Days Post-Op   -pt with + right PT doppler signal.  Several toes ischemic and appear unchanged from yesterday.  -dressing not changed today as RN changed last evening. -wound vac with 350cc/24hr -creatinine trending upward -  management per primary and nephrology -DVT prophylaxis:  Eliquis   Leontine Locket, PA-C [facility] [phone] 08/06/2021 7:35 AM  I have interviewed the patient and examined the patient. I agree with the findings by the PA.  His right groin VAC has a good seal.  Good Doppler flow in the right foot.  Gae Gallop, MD

## 2021-08-06 NOTE — Progress Notes (Addendum)
Family Medicine Teaching Service Daily Progress Note Intern Pager: [phone]  Patient name: Arthur Mcmahon Medical record number: [id number] Date of birth: 02/28/1966 Age: 55 y.o. Gender: male  Primary Care Provider: Shary Key, DO Consultants: Vascular surgery, heme/onc, palliative Code Status: Partial, DNI and would only like one round of ACLS  Pt Overview and Major Events to Date:  11/9: admitted 11/28: underwent US guided biopsy of L supraclavicular lymph node with IR 11/29: changed to partial code  Assessment and Plan:  Arthur Mcmahon is a 55 y.o. male admitted with necrotic surgical wound s/p R common femoral endarterectomy and lower extremity thrombectomy and fasciotomies. Hospital course prolonged secondary to high volume output from his wound vac. PMH includes metastatic urothelial carcinoma of the bladder, RLE ischemia s/p vascular surgery above, RLE edema, anemia, chronic hyponatremia, severe protein calorie malnutrition, and tobacco use.  AKI  Hyperkalemia, Hyperphosphatemia Renal function further worsening this morning. Creatinine 3.09 from 2.6 yesterday and 2.2 previously. Less likely pre-renal at this point given it worsened after fluid bolus yesterday. Concern for possible post-renal due to metastatic carcinoma and mild R hydronephrosis seen previously.  Also with mild hyperkalemia and hyperphosphatemia this morning (5.2 and 4.8 respectively). -Repeat renal ultrasound to ensure no worsening of hydronephrosis -Check bladder scans q8h -Repeat renal function panel this afternoon -If persistent or worsening hyperkalemia on pm labs will give Lokelma  -Continue Mirtazapine for appetite stimulation, encourage PO intake -Obtain repeat UA as well -Consider re-consulting nephro pending additional results  POD#24 s/p debridement of fasciotomy wounds Wound vac remains in place with 365mL output over past 24h. Needs <337mL daily output prior to discharge -Vascular surgery  following, appreciate their care -Compression stocking -BID fasciotomy dressing changes -Tylenol 1g q6h scheduled -Gabapentin 300mg  TID -Oxycodone 5-15mg  q4h prn  Metastatic Urothelial Carcinoma of the Bladder Stage IVA [id number] with widely metastatic lymphadenopathy (extensive retroperitoneal, pelvic, L supraclavicular, L axilla, L para-aortic, and retrocrural). Palliative following -Family meeting 12/5 at [place] (sister, ex-wife, and best friend will be present)  UTI Afebrile. WBC stable -Continue Cefadroxil 500mg  BID (today is day 7 of 7)  Anemia Hgb has remained stable- most recently 9.3 yesterday morning. -PO ferrous sulfate 325mg  daily -CBC tomorrow am  Constipation Last BM 11/30 -Increase Miralax to 17g BID -Add Senna daily  Remainder of problems chronic and stable.  FEN/GI: Regular diet, Ensure and Juven supplements PPx: Eliquis 5mg  BID for PAD Dispo:Home with home health  pending decrease in wound vac output.   Subjective:  No acute events overnight. Patient with unchanged pain this morning, which he says is "bladder pain". Otherwise has no complaints.   Objective: Temp:  [98.3 F (36.8 C)-98.8 F (37.1 C)] 98.3 F (36.8 C) (12/03 0200) Pulse Rate:  [83-90] 85 (12/02 1638) Resp:  [10-21] 11 (12/03 0200) BP: (102-120)/(62-85) 102/62 (12/03 0200) SpO2:  [95 %-98 %] 98 % (12/02 1638) Physical Exam: General: alert, chronically ill appearing, NAD Cardiovascular: RRR, normal S1/S2 without m/r/g Respiratory: normal effort, lungs CTA anteriorly Abdomen: non-distended, nontender Extremities: 2-3+ pitting edema of R lower extremity up to sacral area, compression stocking in place on L lower extremity. Wound vac in place in R inguinal area.  Laboratory: Recent Labs  Lab 08/03/21 0131 08/04/21 0605 08/05/21 0222  WBC 12.6* 13.0* 12.6*  HGB 8.8* 8.9* 9.3*  HCT 27.2* 27.5* 29.2*  PLT 463* 493* 478*   Recent Labs  Lab 07/31/21 1840 07/31/21 2123 08/01/21 0452  08/01/21 1619 08/03/21 0131 08/04/21 3329 08/05/21 0222  NA  --  124* 124*   < > 126* 126* 127*  K  --  6.0* 5.7*   < > 4.6 4.6 5.0  CL  --  90* 93*   < > 95* 93* 98  CO2  --  23 23   < > 21* 22 21*  BUN  --  59* 61*   < > 61* 60* 70*  CREATININE  --  2.18* 2.43*   < > 2.16* 2.21* 2.60*  CALCIUM  --  9.4 9.4   < > 9.2 9.3 9.8  PROT 6.4* 6.1* 5.9*  --   --   --   --   BILITOT 0.6 0.4 0.5  --   --   --   --   ALKPHOS 91 82 81  --   --   --   --   ALT 9 9 9   --   --   --   --   AST 26 19 19   --   --   --   --   GLUCOSE  --  80 77   < > 71 80 83   < > = values in this interval not displayed.     Imaging/Diagnostic Tests: No new imaging/diagnostic tests over past 24h.   Alcus Dad, MD 08/06/2021, 3:32 AM PGY-2, [place] Intern pager: [phone], text pages welcome

## 2021-08-06 NOTE — Progress Notes (Signed)
Paged CKD nephro, Dr. Jonnie Finner returned page. Called regarding worsening renal function. Dr. Jonnie Finner relayed that Dr. Carolin Sicks, who saw the patient last on 11/30, will be rounding in the morning.   [name], MD

## 2021-08-07 DIAGNOSIS — N1330 Unspecified hydronephrosis: Secondary | ICD-10-CM

## 2021-08-07 DIAGNOSIS — E875 Hyperkalemia: Secondary | ICD-10-CM

## 2021-08-07 LAB — COMPREHENSIVE METABOLIC PANEL
ALT: 7 U/L (ref 0–44)
AST: 17 U/L (ref 15–41)
Albumin: 1.9 g/dL — ABNORMAL LOW (ref 3.5–5.0)
Alkaline Phosphatase: 77 U/L (ref 38–126)
Anion gap: 10 (ref 5–15)
BUN: 84 mg/dL — ABNORMAL HIGH (ref 6–20)
CO2: 22 mmol/L (ref 22–32)
Calcium: 9.9 mg/dL (ref 8.9–10.3)
Chloride: 98 mmol/L (ref 98–111)
Creatinine, Ser: 3.44 mg/dL — ABNORMAL HIGH (ref 0.61–1.24)
GFR, Estimated: 20 mL/min — ABNORMAL LOW (ref >60)
Glucose, Bld: 76 mg/dL (ref 70–99)
Potassium: 5.6 mmol/L — ABNORMAL HIGH (ref 3.5–5.1)
Sodium: 130 mmol/L — ABNORMAL LOW (ref 135–145)
Total Bilirubin: 0.7 mg/dL (ref 0.3–1.2)
Total Protein: 6.1 g/dL — ABNORMAL LOW (ref 6.5–8.1)

## 2021-08-07 LAB — BLOOD GAS, VENOUS
Acid-base deficit: 1.8 mmol/L (ref 0.0–2.0)
Bicarbonate: 23.3 mmol/L (ref 20.0–28.0)
Drawn by: 164
FIO2: 21
O2 Saturation: 12.8 %
Patient temperature: 37
pCO2, Ven: 46 mmHg (ref 44.0–60.0)
pH, Ven: 7.326 (ref 7.250–7.430)
pO2, Ven: <32 mmHg — CL (ref 32.0–45.0)

## 2021-08-07 LAB — BASIC METABOLIC PANEL
Anion gap: 10 (ref 5–15)
BUN: 88 mg/dL — ABNORMAL HIGH (ref 6–20)
CO2: 21 mmol/L — ABNORMAL LOW (ref 22–32)
Calcium: 9.6 mg/dL (ref 8.9–10.3)
Chloride: 98 mmol/L (ref 98–111)
Creatinine, Ser: 3.84 mg/dL — ABNORMAL HIGH (ref 0.61–1.24)
GFR, Estimated: 18 mL/min — ABNORMAL LOW (ref >60)
Glucose, Bld: 95 mg/dL (ref 70–99)
Potassium: 5.3 mmol/L — ABNORMAL HIGH (ref 3.5–5.1)
Sodium: 129 mmol/L — ABNORMAL LOW (ref 135–145)

## 2021-08-07 LAB — RENAL FUNCTION PANEL
Albumin: 2 g/dL — ABNORMAL LOW (ref 3.5–5.0)
Anion gap: 9 (ref 5–15)
BUN: 84 mg/dL — ABNORMAL HIGH (ref 6–20)
CO2: 21 mmol/L — ABNORMAL LOW (ref 22–32)
Calcium: 9.6 mg/dL (ref 8.9–10.3)
Chloride: 97 mmol/L — ABNORMAL LOW (ref 98–111)
Creatinine, Ser: 3.41 mg/dL — ABNORMAL HIGH (ref 0.61–1.24)
GFR, Estimated: 21 mL/min — ABNORMAL LOW (ref >60)
Glucose, Bld: 65 mg/dL — ABNORMAL LOW (ref 70–99)
Phosphorus: 5.8 mg/dL — ABNORMAL HIGH (ref 2.5–4.6)
Potassium: 5.7 mmol/L — ABNORMAL HIGH (ref 3.5–5.1)
Sodium: 127 mmol/L — ABNORMAL LOW (ref 135–145)

## 2021-08-07 LAB — GLUCOSE, CAPILLARY
Glucose-Capillary: 51 mg/dL — ABNORMAL LOW (ref 70–99)
Glucose-Capillary: 90 mg/dL (ref 70–99)

## 2021-08-07 LAB — CK: Total CK: <5 U/L — ABNORMAL LOW (ref 49–397)

## 2021-08-07 LAB — CBC
HCT: 30.7 % — ABNORMAL LOW (ref 39.0–52.0)
Hemoglobin: 9.3 g/dL — ABNORMAL LOW (ref 13.0–17.0)
MCH: 27.7 pg (ref 26.0–34.0)
MCHC: 30.3 g/dL (ref 30.0–36.0)
MCV: 91.4 fL (ref 80.0–100.0)
Platelets: 497 10*3/uL — ABNORMAL HIGH (ref 150–400)
RBC: 3.36 MIL/uL — ABNORMAL LOW (ref 4.22–5.81)
RDW: 16.2 % — ABNORMAL HIGH (ref 11.5–15.5)
WBC: 13.8 10*3/uL — ABNORMAL HIGH (ref 4.0–10.5)
nRBC: 0 % (ref 0.0–0.2)

## 2021-08-07 MED ORDER — POLYETHYLENE GLYCOL 3350 17 G PO PACK
34.0000 g | PACK | Freq: Two times a day (BID) | ORAL | Status: DC
  Administered 2021-08-07 – 2021-08-08 (×3): 34 g via ORAL
  Filled 2021-08-07 (×2): qty 2

## 2021-08-07 MED ORDER — SENNOSIDES-DOCUSATE SODIUM 8.6-50 MG PO TABS
1.0000 | ORAL_TABLET | Freq: Two times a day (BID) | ORAL | Status: DC
  Administered 2021-08-07 – 2021-08-08 (×3): 1 via ORAL
  Filled 2021-08-07 (×3): qty 1

## 2021-08-07 MED ORDER — OXYCODONE HCL 5 MG PO TABS
5.0000 mg | ORAL_TABLET | Freq: Four times a day (QID) | ORAL | Status: DC | PRN
  Administered 2021-08-07 – 2021-08-08 (×2): 5 mg via ORAL
  Filled 2021-08-07 (×2): qty 1

## 2021-08-07 MED ORDER — GABAPENTIN 100 MG PO CAPS
100.0000 mg | ORAL_CAPSULE | Freq: Three times a day (TID) | ORAL | Status: DC
  Administered 2021-08-07 – 2021-08-08 (×3): 100 mg via ORAL
  Filled 2021-08-07 (×3): qty 1

## 2021-08-07 MED ORDER — SODIUM ZIRCONIUM CYCLOSILICATE 5 G PO PACK
5.0000 g | PACK | Freq: Once | ORAL | Status: AC
  Administered 2021-08-07: 17:00:00 5 g via ORAL
  Filled 2021-08-07: qty 1

## 2021-08-07 MED ORDER — SODIUM ZIRCONIUM CYCLOSILICATE 10 G PO PACK
10.0000 g | PACK | Freq: Every day | ORAL | Status: DC
  Administered 2021-08-07: 09:00:00 10 g via ORAL
  Filled 2021-08-07: qty 1

## 2021-08-07 MED ORDER — SODIUM ZIRCONIUM CYCLOSILICATE 10 G PO PACK: 10.0000 g | PACK | Freq: Every day | ORAL | Status: DC

## 2021-08-07 MED ORDER — SODIUM CHLORIDE 0.9 % IV SOLN: INTRAVENOUS | Status: DC

## 2021-08-07 NOTE — Progress Notes (Signed)
[facility] NEPHROLOGY PROGRESS NOTE  Assessment/ Plan: Pt is a 55 y.o. yo male  with past medical history of hypertension, who was admitted with vascular complications and lymphedema due to metastatic bladder cancer, seen as a consultation for the evaluation of acute kidney injury earlier from 11/28- 11/30 for AKI w/ peak creat 2.45 rx'd as below and improved to 2.1 and we signed off. Since then creat has ^'d up to 3.4 yest and 3.84 today. UOP is lower now around 200-300 cc /day. Asked to see for worsening renal failure.   Assessment/ Plan: Acute kidney injury, oliguric: prior AKI felt due to intravascular volume depletion/strep sepsis/surgery with hemodynamic changes +/- right hydronephrosis.  CK level not elevated. Urinalysis looked like UTI w/ minimal protein.  No recent IV contrast or nephrotoxins. Pt was given IV albumin and IVF's w/ improved renal function w/ creat down to 2.1. Now several days later creatinine is rising daily up to 3.84 today. UOP is down to 200-300 cc per day. On exam pt has severe anasarca from the feet to the scapulae. BP's are normal, no shock. Pt is becoming more sleepy. Most recent urine studies showed UNa <10, UCr 88 on 11/28, 1 week ago. UA has wbc's/ rbc's due to infection and/or cancer. Last CXR 11/27 was w/o any vasc congestion or edema. Last echo on 07/03/21 showed LVEF 60-65%. Renal US 12/03 showed persistent mild R hydronephrosis, no L hydronephrosis. Albumin is low at 1.8- 2.0.  AKI not likely due to obstruction since [place] yesterday showed no increase in hydro (mild R, none left) compared to priors. Bladder scans have been low per RN tonight. Just to be sure though will have foley placed instead of current condom cath. Pt is not hypotensive nor hasn't received nephrotoxins (dye, acei, etc). The most impressive feature is the severe anasarca along w/ low UNa; marked vol overload but kidney's not well perfused. Pt weights are up 30kg which may actually be  accurate. Widespread cancer blocking fluid return through the lymphatics is one mechanism to explain suspected intravasc vol depletion. The other would be venous thrombosis - large vessel, e.g., renal vein thrombosis, or diffuse small vessel occlusion from tumor spread, causing lack of fluid return to the blood space. Either way I don't see a good treatment.  Giving more IVF"s seems unwise given his current state of extreme volume overload. Could try IV albumin. Could try IV heparin.  Suspect he will continue to decline as his advanced metastatic cancer is almost certainly the cause of the renal failure. Not good HD candidate. Recommend supportive care for now, and comfort care when appropriate. Will follow.  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 TURBT on 10/31. Right hydroureteronephrosis, due to obstruction of bladder mass. F/b urology Right lower extremity ischemia: S/p revascularization with vascular surgery. Now with right lower extremity edema. Problem caused by extrinsic compression of right common femoral vein adenopathy. F/b VVS.  Severe protein calorie malnutrition: Continue Ensure and follow dietitian. Sepsis of right groin: s/p debridement of necrosis of surgical incision site: Currently has right groin wound VAC.  Hyperkalemia - renal diet and bid 10gm lokelma for now  Kelly Splinter, MD 08/07/2021, 7:56 PM       Subjective: Seen and examined. Pt is lethargic but awakens to voice and follows simple commands.   Physical Exam: General: quite lethargic, bedbound, massive edema of the hips  and lower extremities, no UE edema, +flank edema up to the shoulders Heart:RRR, s1s2 nl Lungs: Clear b/l, no crackle Abdomen:soft, Non-tender, non-distended Extremities: as above Neurology:  somnolent, able to respond and answer simple questions  Objective Vital signs in last 24 hours: Vitals:   08/07/21 0732 08/07/21 1128 08/07/21 1512 08/07/21 1919  BP: 119/71 115/70 110/68 131/82  Pulse: 91 83 83 99  Resp: 16 14 11 17   Temp: 97.6 F (36.4 C) 98.2 F (36.8 C) 98.4 F (36.9 C) 97.9 F (36.6 C)  TempSrc: Oral Oral Oral Oral  SpO2: 98% 97% 97% 96%  Weight:      Height:            Labs: Basic Metabolic Panel: Recent Labs  Lab 08/06/21 0124 08/06/21 1322 08/07/21 0040 08/07/21 0404 08/07/21 1431  NA 128* 128* 127* 130* 129*  K 5.2* 5.2* 5.7* 5.6* 5.3*  CL 97* 97* 97* 98 98  CO2 24 23 21* 22 21*  GLUCOSE 71 86 65* 76 95  BUN 78* 81* 84* 84* 88*  CREATININE 3.09* 3.32* 3.41* 3.44* 3.84*  CALCIUM 9.7 9.8 9.6 9.9 9.6  PHOS 4.8* 5.3* 5.8*  --   --     Liver Function Tests: Recent Labs  Lab 07/31/21 2123 08/01/21 0452 08/02/21 0446 08/06/21 1322 08/07/21 0040 08/07/21 0404  AST 19 19  --   --   --  17  ALT 9 9  --   --   --  7  ALKPHOS 82 81  --   --   --  77  BILITOT 0.4 0.5  --   --   --  0.7  PROT 6.1* 5.9*  --   --   --  6.1*  ALBUMIN 1.6* 1.6*   < > 1.9* 2.0* 1.9*   < > = values in this interval not displayed.    No results for input(s): LIPASE, AMYLASE in the last 168 hours. No results for input(s): AMMONIA in the last 168 hours. CBC: US RENAL  Result Date: 08/06/2021 CLINICAL DATA:  Acute kidney injury, hydronephrosis. History of bladder cancer. EXAM: RENAL / URINARY TRACT ULTRASOUND COMPLETE COMPARISON:  CT abdomen dated 07/01/2021. FINDINGS: Right Kidney: Renal measurements: 10.9 x 4.9 x 5.1 cm = volume: 142 mL. Echogenicity is within normal limits. Mild hydronephrosis. Left Kidney: Renal measurements: 13.3 x 6.8 x 6.1 cm = volume: 291 mL. Echogenicity within normal limits. LEFT renal cysts measures 2.8 cm. No suspicious mass or hydronephrosis. Bladder: Solid-appearing mass within the midline pelvis, with internal vascularity, measuring  approximately 8 cm greatest dimension. A normal-appearing bladder is not seen separate from this mass. Other: None. IMPRESSION: 1. Mild RIGHT-sided hydronephrosis, similar to hydronephrosis demonstrated on CT abdomen of 07/01/2021. 2. LEFT kidney is unremarkable.  No LEFT-sided hydronephrosis. 3. Solid-appearing mass within the midline pelvis. This likely corresponds to the bladder mass described on CT abdomen/pelvis of 07/01/2021. No normal-appearing bladder is seen separate from this mass. Electronically Signed   By: Franki Cabot M.D.   On: 08/06/2021 11:32    Medications: Infusions:  sodium chloride 125 mL/hr at 08/07/21 1714    Scheduled Medications:  acetaminophen  1,000 mg Oral Q6H   apixaban  5 mg Oral BID   atorvastatin  40 mg Oral Daily   Chlorhexidine Gluconate Cloth  6 each Topical Daily   feeding supplement  237 mL Oral BID BM   ferrous sulfate  325 mg Oral Q breakfast   gabapentin  100 mg Oral  TID   levothyroxine  25 mcg Oral QAC breakfast   mirtazapine  15 mg Oral QHS   multivitamin with minerals  1 tablet Oral Daily   nutrition supplement (JUVEN)  1 packet Oral BID BM   polyethylene glycol  34 g Oral BID   senna-docusate  1 tablet Oral BID   sodium chloride flush  10-40 mL Intracatheter Q12H   sodium zirconium cyclosilicate  10 g Oral Daily    have reviewed scheduled and prn medications.

## 2021-08-07 NOTE — Progress Notes (Signed)
Family Medicine Teaching Service Daily Progress Note Intern Pager: [phone]  Patient name: Arthur Mcmahon Medical record number: [id number] Date of birth: September 06, 1965 Age: 55 y.o. Gender: male  Primary Care Provider: Shary Key, DO Consultants: Vascular surgery, oncology, palliative care  Code Status: partial (DNI and would only like one round of ACLS)  Pt Overview and Major Events to Date:  11/9: admitted to [facility] post vascular procedure  11/28: underwent US guided biopsy of L supraclavicular lymph node with IR 11/29: changed to partial code 11/30: nephrology signed off   Assessment and Plan: Arthur Mcmahon is a 55 y.o. male  admitted with necrotic surgical wound s/p R common femoral endarterectomy and lower extremity thrombectomy and fasciotomies. Hospital course prolonged secondary to high volume output from his wound vac.  PMH includes metastatic urothelial carcinoma of the bladder, RLE ischemia s/p vascular surgery above, RLE edema, anemia, chronic hyponatremia, severe protein calorie malnutrition, and tobacco use.  Oliguric AKI  Hyperkalemia  Hyperphosphatemia  Potassium 5.7 (repeat 5.6) this morning. Pt started on LR yesterday which may have contributed to his hyperkalemia. Discontinue LR and switch to NS. Pt given 10g Lokelma. Serum Cr stable. UOP ~500cc.  Phosphorus 5.8. Possibly prerenal due to intravascular volume depletion vs post-renal due to metastatic bladder CA. Renal US yesterday with stable right hydronephrosis. Recent bladder scan 0.  -nephrology signed off,  appreciate recommendations  -discontinue LR 100 mL/hr and start NS 125 mL/hr  -monitor Is/Os  -continue condom catheter  -repeat BMP 1 pm   POD#25 s/p debridement of fasciotomy wounds  Wound vac in place. Vascular surgery recommends <300 cc output to safely discharge.  -Vascular surgery following, appreciate recommendations -BID fasciotomy dressing changes  -Tylenol sch  -Gabapentin 200 mg TID; decrease  per renal function  -Oxycodone 5-15 mg q4h PRN    Anemia  Hgb stable 9.3.  - AM CBC  - iron supplement daily    Constipation  No stool chart. Last stool 11/30.  - Increase Miralax 34g BID for 4 doses  - Senna BID   Hyponatremia  Na 130 this morning.  - Continue NaCl 1g TID  - AM RFP  Metastatic Urothelial Bladder Cancer  Stave IVA with metastatic lymphadenopathy (seen on CT hematuria).  - oncology consulted, follow up outpatient - palliative consulted, appreciated recommendations   UTI Resolved  - s/p 7-day course cefadroxil   FEN/GI: regular diet  PPx: Eliquis   Disposition: pending decr in wound vac output   Subjective:  No acute events overnight.   Objective: Temp:  [97.6 F (36.4 C)-99 F (37.2 C)] 99 F (37.2 C) (12/04 0309) Pulse Rate:  [70-98] 98 (12/04 0309) Resp:  [12-19] 19 (12/04 0309) BP: (105-127)/(55-84) 119/79 (12/04 0309) SpO2:  [90 %-100 %] 98 % (12/04 0309)   Physical Exam: General: ill appearing male, cachetic, male  Cardiovascular: regular rate and rhythm Respiratory: clear bilaterally  Abdomen: distended, soft, non-tender  Extremities: dopplerable LE pulse (vasc at bedside), 4+ pitting edema extended from LE to the low back sparing UE  Laboratory: Recent Labs  Lab 08/04/21 0605 08/05/21 0222 08/07/21 0040  WBC 13.0* 12.6* 13.8*  HGB 8.9* 9.3* 9.3*  HCT 27.5* 29.2* 30.7*  PLT 493* 478* 497*   Recent Labs  Lab 07/31/21 1840 07/31/21 2123 08/01/21 0452 08/01/21 1619 08/06/21 0124 08/06/21 1322 08/07/21 0040  NA  --  124* 124*   < > 128* 128* 127*  K  --  6.0* 5.7*   < > 5.2*  5.2* 5.7*  CL  --  90* 93*   < > 97* 97* 97*  CO2  --  23 23   < > 24 23 21*  BUN  --  59* 61*   < > 78* 81* 84*  CREATININE  --  2.18* 2.43*   < > 3.09* 3.32* 3.41*  CALCIUM  --  9.4 9.4   < > 9.7 9.8 9.6  PROT 6.4* 6.1* 5.9*  --   --   --   --   BILITOT 0.6 0.4 0.5  --   --   --   --   ALKPHOS 91 82 81  --   --   --   --   ALT 9 9 9   --   --    --   --   AST 26 19 19   --   --   --   --   GLUCOSE  --  80 77   < > 71 86 65*   < > = values in this interval not displayed.      Imaging/Diagnostic Tests: US RENAL  Result Date: 08/06/2021 CLINICAL DATA:  Acute kidney injury, hydronephrosis. History of bladder cancer. EXAM: RENAL / URINARY TRACT ULTRASOUND COMPLETE COMPARISON:  CT abdomen dated 07/01/2021. FINDINGS: Right Kidney: Renal measurements: 10.9 x 4.9 x 5.1 cm = volume: 142 mL. Echogenicity is within normal limits. Mild hydronephrosis. Left Kidney: Renal measurements: 13.3 x 6.8 x 6.1 cm = volume: 291 mL. Echogenicity within normal limits. LEFT renal cysts measures 2.8 cm. No suspicious mass or hydronephrosis. Bladder: Solid-appearing mass within the midline pelvis, with internal vascularity, measuring approximately 8 cm greatest dimension. A normal-appearing bladder is not seen separate from this mass. Other: None. IMPRESSION: 1. Mild RIGHT-sided hydronephrosis, similar to hydronephrosis demonstrated on CT abdomen of 07/01/2021. 2. LEFT kidney is unremarkable.  No LEFT-sided hydronephrosis. 3. Solid-appearing mass within the midline pelvis. This likely corresponds to the bladder mass described on CT abdomen/pelvis of 07/01/2021. No normal-appearing bladder is seen separate from this mass. Electronically Signed   By: Franki Cabot M.D.   On: 08/06/2021 11:32     Lyndee Hensen, DO 08/07/2021, 4:01 AM PGY-3, [place] Intern pager: [phone], text pages welcome

## 2021-08-07 NOTE — Progress Notes (Signed)
FPTS Brief Progress Note  S: Patient sleeping comfortably during exam. Did not wake him.   O: BP 131/82 (BP Location: Right Arm)   Pulse 99   Temp 97.9 F (36.6 C) (Oral)   Resp 17   Ht 6\' 2"  (1.88 m)   Wt 129.7 kg   SpO2 96%   BMI 36.72 kg/m   Physical Exam Vitals reviewed.  Constitutional:      General: He is not in acute distress.    Comments: Chronically ill appearing, sleeping in hospital bed  Pulmonary:     Effort: Pulmonary effort is normal.     A/P: AKI, oliguric  Metastatic Urothelial Carcinoma of Bladder: VSS. Worsening Cr today, up to 3.84. Worsening diffuse anasarca. Evaluated by nephrology who felt he was not a good HD candidate due to poor prognosis and expected continued decline in function secondary to metastatic urothelial bladder CA. - Will continue to monitor electrolytes and give lokelma PRN for hyperkalemia - Nephrology following - Discontinued IVF due to volume overload - Palliative meeting tomorrow at 10am with possible subsequent transition to comfort care/hospice   POD #25 s/p debridement of fasciotomy wounds Wound vac continues to have substantial drainage (358mL/day). Continues to be a barrier to discharge.  Remainder of management per day team's note.   - Orders reviewed. Labs for AM ordered, which was adjusted as needed.  - If condition changes, plan includes supportive care and symptomatic management.   Eppie Gibson, MD 08/07/2021, 8:27 PM PGY-1, [facility] Night Resident  Please page [phone] with questions.

## 2021-08-07 NOTE — Progress Notes (Addendum)
FPTS Interim Night Progress Note  S:Patient sleeping comfortably.  Rounded with primary night RN.   O: Today's Vitals   08/06/21 2031 08/06/21 2345 08/06/21 2350 08/07/21 0309  BP: (!) 107/55  120/71 119/79  Pulse: 70  88 98  Resp: 17  18 19   Temp: 98.4 F (36.9 C)  98.5 F (36.9 C) 99 F (37.2 C)  TempSrc: Oral  Oral Oral  SpO2: 90%  100% 98%  Weight:      Height:      PainSc:  Asleep        A/P: Continue current management  Hyperkalemia/Hypoglycemia K 5.7. Glu 65.  Asymptomatic EKG stat Repeat CMP stat Follow up with labs   Carollee Leitz MD PGY-3, [place] Medicine Service pager [phone]

## 2021-08-07 NOTE — Progress Notes (Addendum)
Patient's midday labs came back showing potassium improved to 5.2.  Redosed 5 mg Lokelma.  Due to patient's worsening kidney function with hydration reconsulted nephrology.  Spoke with on-call provider who recommended first starting renal diet due to the patient's hyperkalemia to limit the amount of potassium she is consuming.  He said that he would reevaluate and provide further recommendations.

## 2021-08-07 NOTE — Progress Notes (Addendum)
  Progress Note    08/07/2021 7:06 AM 25 Days Post-Op  Subjective:  no complaints.  When asked if he has pain in the right foot he says no.   Tm 99  Vitals:   08/06/21 2350 08/07/21 0309  BP: 120/71 119/79  Pulse: 88 98  Resp: 18 19  Temp: 98.5 F (36.9 C) 99 F (37.2 C)  SpO2: 100% 98%    Physical Exam: Cardiac:  regular Lungs:  non labored Extremities:  RLE with dressing in place that is clean and dry; + right PT doppler signal  CBC    Component Value Date/Time   WBC 13.8 (H) 08/07/2021 0040   RBC 3.36 (L) 08/07/2021 0040   HGB 9.3 (L) 08/07/2021 0040   HCT 30.7 (L) 08/07/2021 0040   PLT 497 (H) 08/07/2021 0040   MCV 91.4 08/07/2021 0040   MCH 27.7 08/07/2021 0040   MCHC 30.3 08/07/2021 0040   RDW 16.2 (H) 08/07/2021 0040   LYMPHSABS 0.7 07/21/2021 0629   MONOABS 0.8 07/21/2021 0629   EOSABS 0.2 07/21/2021 0629   BASOSABS 0.0 07/21/2021 0629    BMET    Component Value Date/Time   NA 130 (L) 08/07/2021 0404   K 5.6 (H) 08/07/2021 0404   CL 98 08/07/2021 0404   CO2 22 08/07/2021 0404   GLUCOSE 76 08/07/2021 0404   BUN 84 (H) 08/07/2021 0404   CREATININE 3.44 (H) 08/07/2021 0404   CALCIUM 9.9 08/07/2021 0404   GFRNONAA 20 (L) 08/07/2021 0404   GFRAA >60 12/01/2019 2208    INR    Component Value Date/Time   INR 1.0 06/18/2021 1550     Intake/Output Summary (Last 24 hours) at 08/07/2021 0706 Last data filed at 08/07/2021 0500 Gross per 24 hour  Intake 10 ml  Output 825 ml  Net -815 ml     Assessment/Plan:  55 y.o. male is s/p:  sharp excisional debridement of right groin and bilateral right lower leg fasciotomy incisions. Brisk multiphasic right PT Doppler signal. Wound VAC      25 Days Post-Op   -pt continues to have +right PT doppler signal -dressing to RLE just changed by nursing and she reports wounds look good -toes unchanged from yesterday-continues with dry ischemic changes to toes -wound vac with 350cc/24hr out -creatinine  elevated-nephrology has been consulted -DVT prophylaxis:  Eliquis   Leontine Locket, PA-C [facility] [phone] 08/07/2021 7:06 AM  I have interviewed the patient and examined the patient. I agree with the findings by the PA. VAC still with significant output. He seems slightly confused when I talk with him today. Wounds on right foot are unchanged.   Gae Gallop, MD

## 2021-08-08 DIAGNOSIS — G893 Neoplasm related pain (acute) (chronic): Secondary | ICD-10-CM

## 2021-08-08 LAB — CBC
HCT: 30 % — ABNORMAL LOW (ref 39.0–52.0)
Hemoglobin: 9.3 g/dL — ABNORMAL LOW (ref 13.0–17.0)
MCH: 28.2 pg (ref 26.0–34.0)
MCHC: 31 g/dL (ref 30.0–36.0)
MCV: 90.9 fL (ref 80.0–100.0)
Platelets: 468 10*3/uL — ABNORMAL HIGH (ref 150–400)
RBC: 3.3 MIL/uL — ABNORMAL LOW (ref 4.22–5.81)
RDW: 16.4 % — ABNORMAL HIGH (ref 11.5–15.5)
WBC: 16.4 10*3/uL — ABNORMAL HIGH (ref 4.0–10.5)
nRBC: 0 % (ref 0.0–0.2)

## 2021-08-08 LAB — COMPREHENSIVE METABOLIC PANEL
ALT: 8 U/L (ref 0–44)
AST: 18 U/L (ref 15–41)
Albumin: 1.8 g/dL — ABNORMAL LOW (ref 3.5–5.0)
Alkaline Phosphatase: 85 U/L (ref 38–126)
Anion gap: 9 (ref 5–15)
BUN: 87 mg/dL — ABNORMAL HIGH (ref 6–20)
CO2: 20 mmol/L — ABNORMAL LOW (ref 22–32)
Calcium: 9.5 mg/dL (ref 8.9–10.3)
Chloride: 99 mmol/L (ref 98–111)
Creatinine, Ser: 3.83 mg/dL — ABNORMAL HIGH (ref 0.61–1.24)
GFR, Estimated: 18 mL/min — ABNORMAL LOW (ref >60)
Glucose, Bld: 72 mg/dL (ref 70–99)
Potassium: 5.6 mmol/L — ABNORMAL HIGH (ref 3.5–5.1)
Sodium: 128 mmol/L — ABNORMAL LOW (ref 135–145)
Total Bilirubin: 0.6 mg/dL (ref 0.3–1.2)
Total Protein: 5.9 g/dL — ABNORMAL LOW (ref 6.5–8.1)

## 2021-08-08 LAB — SODIUM, URINE, RANDOM: Sodium, Ur: 35 mmol/L

## 2021-08-08 LAB — CREATININE, URINE, RANDOM: Creatinine, Urine: 101.85 mg/dL

## 2021-08-08 LAB — PHOSPHORUS: Phosphorus: 5.7 mg/dL — ABNORMAL HIGH (ref 2.5–4.6)

## 2021-08-08 MED ORDER — SODIUM ZIRCONIUM CYCLOSILICATE 10 G PO PACK
10.0000 g | PACK | Freq: Once | ORAL | Status: AC
  Administered 2021-08-08: 10 g via ORAL
  Filled 2021-08-08: qty 1

## 2021-08-08 MED ORDER — HYDROMORPHONE HCL 1 MG/ML IJ SOLN
1.0000 mg | INTRAMUSCULAR | Status: DC | PRN
  Administered 2021-08-08 – 2021-08-10 (×6): 1 mg via INTRAVENOUS
  Filled 2021-08-08 (×6): qty 1

## 2021-08-08 MED ORDER — LORAZEPAM 2 MG/ML IJ SOLN: 1.0000 mg | INTRAMUSCULAR | Status: DC | PRN

## 2021-08-08 MED ORDER — BLISTEX MEDICATED EX OINT
TOPICAL_OINTMENT | CUTANEOUS | Status: DC | PRN
  Filled 2021-08-08: qty 6.3

## 2021-08-08 MED ORDER — OXYCODONE HCL 5 MG PO TABS: 5.0000 mg | ORAL_TABLET | Freq: Every day | ORAL | Status: DC

## 2021-08-08 NOTE — Progress Notes (Signed)
Family Medicine Teaching Service Daily Progress Note Intern Pager: [phone]  Patient name: Arthur Mcmahon Medical record number: [id number] Date of birth: 19-Aug-1966 Age: 55 y.o. Gender: male  Primary Care Provider: Shary Key, DO Consultants: Vascular surgery, oncology, palliative care, nephrology Code Status: DNR  Pt Overview and Major Events to Date:  11/9: admitted to [facility] post vascular procedure  11/28: underwent US guided biopsy of L supraclavicular lymph node with IR 11/29: changed to partial code 11/30: nephrology signed off  12/5: palliative medicine meeting > comfort care  Assessment and Plan: Arthur Mcmahon is a 55 y.o. male  admitted with necrotic surgical wound s/p R common femoral endarterectomy and lower extremity thrombectomy and fasciotomies. Hospital course prolonged secondary to high volume output from his wound vac.  PMH includes metastatic urothelial carcinoma of the bladder, RLE ischemia s/p vascular surgery above, RLE edema, anemia, chronic hyponatremia, severe protein calorie malnutrition, and tobacco use.  Metastatic urothelial bladder cancer  palliative care Patient has scheduled family meeting today at 30 AM with sister, ex-wife, best friend with palliative care.  Patient made comfort care today with palliative measures in place.  Oliguric AKI  hyperkalemia  hyperphosphatemia Potassium 5.6, phosphorus 5.7 this morning.  Creatinine continues to worsen now 3.83 this AM.  Patient continues to have poor intake, albumin 1.8 today as well.  Given 10 g Lokelma.  Patient was discontinued on fluids with nephrology recommendations.  Total UOP 265 mL.  Nephrology notes AKI not likely related to obstruction. -Monitor I's/O's, continue condom catheter  POD #26 status postdebridement of fasciotomy wounds Wound VAC in place.  Vascular surgery recommends less than 300 cc output to safely discharge. -Vascular surgery following, appreciate recommendations -Twice daily  fasciotomy dressing changes -Dilaudid 1 mg every 2 hours as needed  Anemia Hemoglobin 9.3 this AM. -Continue iron supplement daily  Constipation Last stool 11/30. -Senna twice daily  FEN/GI: Regular diet PPx: None Dispo: residential hospice when bed available given transition to comfort care.   Subjective:  Patient notes he is having pain in his legs.  He is aware that there is a family meeting today with his labs once in palliative medicine.  Objective: Temp:  [97.4 F (36.3 C)-98.4 F (36.9 C)] 98.4 F (36.9 C) (12/05 0302) Pulse Rate:  [83-100] 100 (12/05 0302) Resp:  [11-18] 18 (12/05 0302) BP: (110-131)/(68-82) 121/79 (12/05 0302) SpO2:  [95 %-99 %] 95 % (12/05 0302) Physical Exam: General: Fatigued, minimally capable to converse without prompting Cardiovascular: RRR, no murmurs auscultated Respiratory: CTA B, no crackles Abdomen: 4+ edema of hips up to chest Extremities: 4+ edema of bilateral lower extremities  Laboratory: Recent Labs  Lab 08/05/21 0222 08/07/21 0040 08/08/21 0056  WBC 12.6* 13.8* 16.4*  HGB 9.3* 9.3* 9.3*  HCT 29.2* 30.7* 30.0*  PLT 478* 497* 468*   Recent Labs  Lab 08/07/21 0404 08/07/21 1431 08/08/21 0056  NA 130* 129* 128*  K 5.6* 5.3* 5.6*  CL 98 98 99  CO2 22 21* 20*  BUN 84* 88* 87*  CREATININE 3.44* 3.84* 3.83*  CALCIUM 9.9 9.6 9.5  PROT 6.1*  --  5.9*  BILITOT 0.7  --  0.6  ALKPHOS 77  --  85  ALT 7  --  8  AST 17  --  18  GLUCOSE 76 95 72   Imaging/Diagnostic Tests: No results found.  Wells Guiles, DO 08/08/2021, 7:42 AM PGY-1, [facility] Intern pager: [phone], text pages welcome

## 2021-08-08 NOTE — Progress Notes (Signed)
Patient ID: Arthur Mcmahon, male   DOB: 11-28-1965, 55 y.o.   MRN: [id number]    Progress Note from the Palliative Medicine Team at [facility]   Patient Name: Arthur Mcmahon        Date: 08/08/2021 DOB: 01/24/1966  Age: 55 y.o. MRN#: [id number] Attending Physician: Arthur Chime, MD Primary Care Physician: Arthur Key, DO Admit Date: 07/13/2021   Medical records reviewed   55 y.o. male   admitted on 07/13/2021 with  history of recent surgery for critical limb ischemia of the right lower extremity where patient had underwent endarterectomy and fistulotomy with thrombectomy presently on Eliquis had followed up with urology clinic for bladder mass found during imaging  New diagnosis of urothelial carcinoma, a CTA aortobifemoral was obtained on 06/18/2021 which showed numerous bulky retroperitoneal, iliac, and pelvic sidewall lymph nodes.  A CT hematuria work-up was obtained on 07/01/2021 which showed a large (7 cm) irregular right sided bladder mass likely obstructing the right ureter with marked right-sided hydroureteronephrosis, extensive retroperitoneal and pelvic adenopathy.  A CT of the chest with contrast was performed on 07/01/2021 which showed pathologic adenopathy within the left supraclavicular region, left axilla, and left periaortic region consistent with metastatic disease.  There are also borderline enlarged lymph nodes within the retrocrural region and retroperitoneum consistent with metastatic disease.    Today is day 26 of this hospitalization and patient has had continued slow physical and functional decline within the context of full medical support. Worsening renal function, increased confusion.    Patient and family  face ongoing treatment option decisions, advanced directive decisions and anticipatory care needs.  This NP visited patient at the bedside scheduled with family to include his sister/Arthur Mcmahon, his ex-wife/Arthur Mcmahon, his son/Arthur Mcmahon and his friend/Arthur Mcmahon  for continued  conversation with Arthur Mcmahon regarding his current medical situation.    Per oncology patient's disease is metastatic and not curable.  His decreased renal function decreases options for systemic treatment.    Education offered and concepts discussed regarding human mortality, adult failure to thrive and the limitations of medical interventions to provide quality of life when the body fails to thrive.  All understand the seriousness of his medical situation and the  associated poor prognosis.    Hope is for comfort and dignity at this known end-of-life period of time.  Plan of care:      -DNR/DNI -No artificial feeding or hydration now or in the future -No further diagnostics, IV medications, lab draws -Symptom management          -Dilaudid 1 mg IV every 2 hours as needed for pain or dyspnea          -Ativan 1 mg IV every 4 hours as needed for anxiety or agitation -Reevaluate in the morning for viable transition of care options -Prognosis is likely days to weeks  Education offered on hospice philosophy, hospice eligibility and residential hospice option.  Education offered to family regarding the natural trajectory and expectations at end-of-life.  Emotional support offered.  Questions and concerns addressed.    Discussed with treatment team   Total time spent on the unit was 65 minutes  Greater than 50% of the time was spent in counseling and coordination of care  Arthur Lessen NP  Palliative Medicine Team Team Phone # [phone] Pager [phone]

## 2021-08-08 NOTE — Progress Notes (Signed)
FPTS Brief Note Reviewed patient's vitals, recent notes.  Vitals:   08/08/21 0856 08/08/21 1935  BP: 109/72 (!) 142/94  Pulse: 96 98  Resp: 11 15  Temp: 98.3 F (36.8 C) 97.7 F (36.5 C)  SpO2: 93% 98%   At this time, no change in plan from day progress note-- continue comfort measures only. Palliative looking into residential hospice options.   Alcus Dad, MD Page [phone] with questions about this patient.

## 2021-08-08 NOTE — Progress Notes (Addendum)
  Progress Note    08/08/2021 7:25 AM 26 Days Post-Op  Subjective:  sleeping-did not wake  afebrile  Vitals:   08/08/21 0043 08/08/21 0302  BP: 117/75 121/79  Pulse: 100 100  Resp: 17 18  Temp: (!) 97.4 F (36.3 C) 98.4 F (36.9 C)  SpO2: 99% 95%    Physical Exam: General:  no distress; sleeping Lungs:  non labored Incisions:  dressing RLE in place and dry Extremities:  + right PT doppler signal  CBC    Component Value Date/Time   WBC 16.4 (H) 08/08/2021 0056   RBC 3.30 (L) 08/08/2021 0056   HGB 9.3 (L) 08/08/2021 0056   HCT 30.0 (L) 08/08/2021 0056   PLT 468 (H) 08/08/2021 0056   MCV 90.9 08/08/2021 0056   MCH 28.2 08/08/2021 0056   MCHC 31.0 08/08/2021 0056   RDW 16.4 (H) 08/08/2021 0056   LYMPHSABS 0.7 07/21/2021 0629   MONOABS 0.8 07/21/2021 0629   EOSABS 0.2 07/21/2021 0629   BASOSABS 0.0 07/21/2021 0629    BMET    Component Value Date/Time   NA 128 (L) 08/08/2021 0056   K 5.6 (H) 08/08/2021 0056   CL 99 08/08/2021 0056   CO2 20 (L) 08/08/2021 0056   GLUCOSE 72 08/08/2021 0056   BUN 87 (H) 08/08/2021 0056   CREATININE 3.83 (H) 08/08/2021 0056   CALCIUM 9.5 08/08/2021 0056   GFRNONAA 18 (L) 08/08/2021 0056   GFRAA >60 12/01/2019 2208    INR    Component Value Date/Time   INR 1.0 06/18/2021 1550     Intake/Output Summary (Last 24 hours) at 08/08/2021 0725 Last data filed at 08/07/2021 2039 Gross per 24 hour  Intake 10 ml  Output 265 ml  Net -255 ml     Assessment/Plan:  55 y.o. male is s/p:  sharp excisional debridement of right groin and bilateral right lower leg fasciotomy incisions. 26 Days Post-Op   -pt continues with brisk doppler signal right PT.  -pt now in renal failure with creatinine of 3.8 and felt to be due to his metastatic cancer.  Pt very somnolent this am.  There is a palliative meeting today at [facility].   Will await decisions of meeting before changing dressing. -DVT prophylaxis:  Eliquis   Leontine Locket,  PA-C [facility] [phone] 08/08/2021 7:25 AM  I have seen and evaluated the patient. I agree with the PA note as documented above.  Right groin VAC with 165 mL output over the last 24 hours.  Due for VAC change today which I discussed with nursing and this has been a bedside VAC change.  Continue wet-to-dry to fasciotomies.  Still has a good PT signal and toes have been stable through the weekend with ischemic changes.  Agree with palliative care meeting today and would fully support transition to comfort care.    [name], MD [facility] [place] Office: [phone]

## 2021-08-08 NOTE — Progress Notes (Signed)
Wound VAC dressing per MD order.   Lavenia Atlas, RN

## 2021-08-08 NOTE — Progress Notes (Signed)
[facility] NEPHROLOGY PROGRESS NOTE  Assessment/ Plan: Pt is a 55 y.o. yo male  with past medical history of hypertension, who was admitted with vascular complications and lymphedema due to metastatic bladder cancer, seen as a consultation for the evaluation of acute kidney injury earlier from 11/28- 11/30 for AKI w/ peak creat 2.45 rx'd as below and improved to 2.1 and we signed off. Since then creat has ^'d up to 3.4 yest and 3.84 today. UOP is lower now around 200-300 cc /day. Asked to see for worsening renal failure.   Assessment/ Plan: Acute kidney injury, oliguric: prior AKI felt due to intravascular volume depletion/strep sepsis/surgery with hemodynamic changes +/- right hydronephrosis.  CK level not elevated. Urinalysis looked like UTI w/ minimal protein.  No recent IV contrast or nephrotoxins. Pt was given IV albumin and IVF's w/ improved renal function w/ creat down to 2.1. Now several days later creatinine is rising daily up to 3.8 today (stable today as compared to yesterday). Has significant anasarca. BP's are normal, no shock. Pt is becoming more sleepy.  AKI likely not related to obstruction based on most recent CT. Widespread cancer blocking fluid return through the lymphatics is one mechanism to explain suspected intravasc vol depletion. The other would be venous thrombosis - large vessel, e.g., renal vein thrombosis, or diffuse small vessel occlusion from tumor spread, causing lack of fluid return to the blood space. Either way I don't see a good treatment. Agree with palliative care/comfort care as he would not be an ideal long term HD candidate given underlying comorbidities. 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 TURBT on 10/31. Right  hydroureteronephrosis, due to obstruction of bladder mass. F/b urology Right lower extremity ischemia: S/p revascularization with vascular surgery. Now with right lower extremity edema. Problem caused by extrinsic compression of right common femoral vein adenopathy. F/b VVS.  Severe protein calorie malnutrition: Continue Ensure and follow dietitian. Sepsis of right groin: s/p debridement of necrosis of surgical incision site: Currently has right groin wound VAC.  Hyperkalemia - renal diet and bid 10gm lokelma for now  Gean Quint, MD [facility] 08/08/2021, 8:19 AM  Subjective: Seen and examined. Pt is lethargic but awakens to voice and follows simple commands. No acute events overnight  Physical Exam: General: quite lethargic, bedbound Heart:RRR, s1s2 nl Lungs: Clear b/l, no crackle Abdomen:soft, Non-tender, non-distended Extremities: massive edema of b/l LE's and hips along with flank edema up to shoulders Neurology: somnolent, able to respond and answer simple questions  Objective Vital signs in last 24 hours: Vitals:   08/07/21 1512 08/07/21 1919 08/08/21 0043 08/08/21 0302  BP: 110/68 131/82 117/75 121/79  Pulse: 83 99 100 100  Resp: 11 17 17 18   Temp: 98.4 F (36.9 C) 97.9 F (36.6 C) (!) 97.4 F (36.3 C) 98.4 F (36.9 C)  TempSrc: Oral Oral Oral Oral  SpO2: 97% 96% 99% 95%  Weight:      Height:            Labs: Basic Metabolic Panel: Recent Labs  Lab 08/06/21 1322 08/07/21 0040 08/07/21 0404 08/07/21 1431 08/08/21 0056  NA 128* 127* 130* 129* 128*  K 5.2* 5.7* 5.6* 5.3* 5.6*  CL 97* 97* 98 98 99  CO2 23 21* 22 21* 20*  GLUCOSE 86 65* 76 95 72  BUN 81* 84* 84* 88* 87*  CREATININE 3.32* 3.41* 3.44* 3.84* 3.83*  CALCIUM 9.8 9.6 9.9 9.6 9.5  PHOS 5.3* 5.8*  --   --  5.7*   Liver Function Tests: Recent Labs  Lab 08/07/21 0040 08/07/21 0404 08/08/21 0056  AST  --  17 18  ALT  --  7 8  ALKPHOS  --  77 85  BILITOT  --  0.7 0.6  PROT   --  6.1* 5.9*  ALBUMIN 2.0* 1.9* 1.8*   No results for input(s): LIPASE, AMYLASE in the last 168 hours. No results for input(s): AMMONIA in the last 168 hours. CBC: US RENAL  Result Date: 08/06/2021 CLINICAL DATA:  Acute kidney injury, hydronephrosis. History of bladder cancer. EXAM: RENAL / URINARY TRACT ULTRASOUND COMPLETE COMPARISON:  CT abdomen dated 07/01/2021. FINDINGS: Right Kidney: Renal measurements: 10.9 x 4.9 x 5.1 cm = volume: 142 mL. Echogenicity is within normal limits. Mild hydronephrosis. Left Kidney: Renal measurements: 13.3 x 6.8 x 6.1 cm = volume: 291 mL. Echogenicity within normal limits. LEFT renal cysts measures 2.8 cm. No suspicious mass or hydronephrosis. Bladder: Solid-appearing mass within the midline pelvis, with internal vascularity, measuring approximately 8 cm greatest dimension. A normal-appearing bladder is not seen separate from this mass. Other: None. IMPRESSION: 1. Mild RIGHT-sided hydronephrosis, similar to hydronephrosis demonstrated on CT abdomen of 07/01/2021. 2. LEFT kidney is unremarkable.  No LEFT-sided hydronephrosis. 3. Solid-appearing mass within the midline pelvis. This likely corresponds to the bladder mass described on CT abdomen/pelvis of 07/01/2021. No normal-appearing bladder is seen separate from this mass. Electronically Signed   By: Franki Cabot M.D.   On: 08/06/2021 11:32    Medications: Infusions:    Scheduled Medications:  acetaminophen  1,000 mg Oral Q6H   apixaban  5 mg Oral BID   atorvastatin  40 mg Oral Daily   Chlorhexidine Gluconate Cloth  6 each Topical Daily   feeding supplement  237 mL Oral BID BM   ferrous sulfate  325 mg Oral Q breakfast   gabapentin  100 mg Oral TID   levothyroxine  25 mcg Oral QAC breakfast   mirtazapine  15 mg Oral QHS   multivitamin with minerals  1 tablet Oral Daily   nutrition supplement (JUVEN)  1 packet Oral BID BM   polyethylene glycol  34 g Oral BID   senna-docusate  1 tablet Oral BID   sodium  chloride flush  10-40 mL Intracatheter Q12H    have reviewed scheduled and prn medications.

## 2021-08-09 ENCOUNTER — Encounter: Payer: Self-pay | Admitting: Vascular Surgery

## 2021-08-09 DIAGNOSIS — R531 Weakness: Secondary | ICD-10-CM

## 2021-08-09 NOTE — Progress Notes (Signed)
Pt has transitioned to comfort care, Arthur Mcmahon at [facility] contact regarding home [name]- [employer] will send someone to pick up home VAC that is at bedside as it will no longer be needed. [employer] has come to pick up home VAC - vac and charger returned.

## 2021-08-09 NOTE — Progress Notes (Signed)
Patient transitioned to comfort care. To be d/c'ed to residential hospice likely. Will sign off from a nephrology perspective. Please call with any questions/concerns.  Gean Quint, MD [facility]

## 2021-08-09 NOTE — Social Work (Addendum)
CSW contacted Shanita with [facility] to follow up on pt/family request for [facility]. Shanita and her team will review pt for placement at [facility] on availability.     [facility] accepted pt and will have a bed available tomorrow.

## 2021-08-09 NOTE — Progress Notes (Signed)
FPTS Brief Note Reviewed patient's vitals, recent notes.  Vitals:   08/09/21 0734 08/09/21 2119  BP: 128/89 119/80  Pulse: 95 98  Resp: 16 12  Temp: (!) 97.5 F (36.4 C) 97.6 F (36.4 C)  SpO2: 99% 94%   At this time, no change in plan from day progress note. Awaiting bed at [facility].   Alcus Dad, MD Page [phone] with questions about this patient.

## 2021-08-09 NOTE — Progress Notes (Signed)
  Patient ID: Arthur Mcmahon, male   DOB: [birth year], 55 y.o.   MRN: [id number]    Progress Note from the Palliative Medicine Team at [facility]   Patient Name: Arthur Mcmahon        Date: 08/09/2021 DOB: July 20, 1966  Age: 55 y.o. MRN#: [id number] Attending Physician: Lenoria Chime, MD Primary Care Physician: Shary Key, DO Admit Date: 07/13/2021   Medical records reviewed   55 y.o. male   admitted on 07/13/2021 with  history of recent surgery for critical limb ischemia of the right lower extremity where patient had underwent endarterectomy and fistulotomy with thrombectomy   New diagnosis of urothelial carcinoma, a CTA aortobifemoral was obtained on 06/18/2021 which showed numerous bulky retroperitoneal, iliac, and pelvic sidewall lymph nodes.  A CT hematuria work-up was obtained on 07/01/2021 which showed a large (7 cm) irregular right sided bladder mass likely obstructing the right ureter with marked right-sided hydroureteronephrosis, extensive retroperitoneal and pelvic adenopathy.  A CT of the chest with contrast was performed on 07/01/2021 which showed pathologic adenopathy within the left supraclavicular region, left axilla, and left periaortic region consistent with metastatic disease.  There are also borderline enlarged lymph nodes within the retrocrural region and retroperitoneum consistent with metastatic disease.  Patient has had continued physical and functional decline within the context of full medical support; worsening renal function /not a candidate for HD, not a candidate for oncologic treatment at this time secondary to functional status, increased confusion  Yesterday patient and family made decision to shift to a full comfort path foregoing life-prolonging measures hoping for comfort and dignity at end-of-life.  Exam Patient continues to decline, he is more lethargic today with very little oral intake.  Prognosis is likely days to weeks    Plan of care:       -DNR/DNI -No artificial feeding or hydration now or in the future -No further diagnostics, IV medications, lab draws -Symptom management          -Dilaudid 1 mg IV every 2 hours as needed for pain or dyspnea          -Ativan 1 mg IV every 4 hours as needed for anxiety or agitation -Residential hospice for end-of-life care, family is requesting [facility], will place order -Prognosis is likely days to weeks  I spoke to patient's sister Arthur Mcmahon by telephone today. She expresses comfort and decision for comfort at this time.  Education offered again today on hospice philosophy, hospice eligibility and residential hospice option.  Education offered again today on the natural trajectory and expectations at end-of-life.  Emotional support offered.  Questions and concerns addressed.    Discussed with treatment team   Total time spent on the unit was 35 minutes  Greater than 50% of the time was spent in counseling and coordination of care  Wadie Lessen NP  Palliative Medicine Team Team Phone # [phone] Pager [phone]

## 2021-08-09 NOTE — Progress Notes (Addendum)
[id number]  [facility]) Hospital Liaison Note  Received request from [place] for family interest in [facility] with request for transfer. Chart reviewed and eligibility confirmed. Spoke with patient's sister Lattie Haw to confirm interest and explain services. Family agreeable to transfer tomorrow. TOC aware.    [facility] is unable to offer a room today. Hospital Liaison will follow up tomorrow or sooner if a room becomes available. Please do not hesitate to call with questions.    Buck Mam [facility] Liaison  [phone]

## 2021-08-09 NOTE — Progress Notes (Addendum)
  Progress Note    08/09/2021 7:27 AM 27 Days Post-Op  Subjective:  c/o sliding down in the bed.    Vitals:   08/09/21 0342 08/09/21 0406  BP:    Pulse:  98  Resp: 14 13  Temp:  97.6 F (36.4 C)  SpO2:  96%     CBC    Component Value Date/Time   WBC 16.4 (H) 08/08/2021 0056   RBC 3.30 (L) 08/08/2021 0056   HGB 9.3 (L) 08/08/2021 0056   HCT 30.0 (L) 08/08/2021 0056   PLT 468 (H) 08/08/2021 0056   MCV 90.9 08/08/2021 0056   MCH 28.2 08/08/2021 0056   MCHC 31.0 08/08/2021 0056   RDW 16.4 (H) 08/08/2021 0056   LYMPHSABS 0.7 07/21/2021 0629   MONOABS 0.8 07/21/2021 0629   EOSABS 0.2 07/21/2021 0629   BASOSABS 0.0 07/21/2021 0629    BMET    Component Value Date/Time   NA 128 (L) 08/08/2021 0056   K 5.6 (H) 08/08/2021 0056   CL 99 08/08/2021 0056   CO2 20 (L) 08/08/2021 0056   GLUCOSE 72 08/08/2021 0056   BUN 87 (H) 08/08/2021 0056   CREATININE 3.83 (H) 08/08/2021 0056   CALCIUM 9.5 08/08/2021 0056   GFRNONAA 18 (L) 08/08/2021 0056   GFRAA >60 12/01/2019 2208    INR    Component Value Date/Time   INR 1.0 06/18/2021 1550     Intake/Output Summary (Last 24 hours) at 08/09/2021 0728 Last data filed at 08/09/2021 4268 Gross per 24 hour  Intake 230 ml  Output 945 ml  Net -715 ml     Assessment/Plan:  55 y.o. male is s/p:  sharp excisional debridement of right groin and bilateral right lower leg fasciotomy incisions  27 Days Post-Op   -pt transitioned to comfort care and plans for residential hospice when available.  Vascular surgery will sign off.  Would continue vac to right groin to control lymphatic drainage.     Leontine Locket, PA-C [facility] [phone] 08/09/2021 7:28 AM  I have seen and evaluated the patient. I agree with the PA note as documented above.  Noted plans for transition to comfort care with DNR/DNI.  I think it would be reasonable to continue the [facility] to his right groin given high output lymphatic drainage  for comfort and quality of life.  Please call vascular if we can be of further assistance.  [name], MD [facility] [place] Office: [phone]

## 2021-08-09 NOTE — Progress Notes (Signed)
Nutrition Brief Note  Chart reviewed. Patient transitioning to comfort care.  Plans for residential hospice. No further nutrition interventions planned at this time.  Please re-consult as needed.   Lucas Mallow, RD, LDN, CNSC Please refer to [facility] for contact information.

## 2021-08-09 NOTE — Progress Notes (Signed)
Family Medicine Teaching Service Daily Progress Note Intern Pager: [phone]  Patient name: Arthur Mcmahon Medical record number: [id number] Date of birth: 1965/12/31 Age: 54 y.o. Gender: male  Primary Care Provider: Shary Key, DO Consultants: Vascular surgery, oncology, palliative care, nephrology Code Status: DNR  Pt Overview and Major Events to Date:  11/9: admitted to [facility] post vascular procedure  11/28: underwent US guided biopsy of L supraclavicular lymph node with IR 11/29: changed to partial code 11/30: nephrology signed off  12/5: palliative medicine meeting > comfort care  Assessment and Plan: Arthur Mcmahon is a 55 y.o. male  admitted with necrotic surgical wound s/p R common femoral endarterectomy and lower extremity thrombectomy and fasciotomies. Hospital course prolonged secondary to high volume output from his wound vac.  PMH includes metastatic urothelial carcinoma of the bladder, RLE ischemia s/p vascular surgery above, RLE edema, anemia, chronic hyponatremia, severe protein calorie malnutrition, and tobacco use.  Metastatic urolethial bladder cancer  POD #26 s/p debridement of fasciotomy wounds  palliative care > comfort care measures Patient was transitioned to comfort care after palliative care meeting with family on 12/5.  Specialists have signed off with appropriate care recommendations. Prognosis days to weeks.  Palliative care is pursuing options for residential hospice. -Palliative medicine following, appreciate assistance -DNR/DNI -No artificial feeding or hydration now in future -No further diagnostics, IV medications, lab draws -Dilaudid 1 mg IV every 2 hours as needed for pain or dyspnea -Ativan 1 mg IV every 4 hours as needed for anxiety or agitation -Lip balm ointment as needed -continue VAC to right groin to control lymphatic drainage  Constipation: Resolved Comfort measures include Senokot-S daily.  FEN/GI: Regular PPx: None Dispo:  Residential hospice pending placement.  Subjective:  Patient was lying in bed and capable of responding.   Objective: Temp:  [97.6 F (36.4 C)-98.3 F (36.8 C)] 97.6 F (36.4 C) (12/06 0406) Pulse Rate:  [96-98] 98 (12/06 0406) Resp:  [11-20] 13 (12/06 0406) BP: (109-142)/(72-94) 142/94 (12/05 1935) SpO2:  [93 %-98 %] 96 % (12/06 0406) Physical Exam: General: fatigued, uninterested in conversing Cardiovascular: RRR, no murmurs auscultated Respiratory: CTAB, no increased work of breathing, [place] in place Abdomen: 4+ edema of hips up to chest Extremities: 4+ edema of BLEs  Laboratory: Recent Labs  Lab 08/05/21 0222 08/07/21 0040 08/08/21 0056  WBC 12.6* 13.8* 16.4*  HGB 9.3* 9.3* 9.3*  HCT 29.2* 30.7* 30.0*  PLT 478* 497* 468*   Recent Labs  Lab 08/07/21 0404 08/07/21 1431 08/08/21 0056  NA 130* 129* 128*  K 5.6* 5.3* 5.6*  CL 98 98 99  CO2 22 21* 20*  BUN 84* 88* 87*  CREATININE 3.44* 3.84* 3.83*  CALCIUM 9.9 9.6 9.5  PROT 6.1*  --  5.9*  BILITOT 0.7  --  0.6  ALKPHOS 77  --  85  ALT 7  --  8  AST 17  --  18  GLUCOSE 76 95 72   Imaging/Diagnostic Tests: No results found.  Wells Guiles, DO 08/09/2021, 6:51 AM PGY-1, [place] Intern pager: [phone], text pages welcome

## 2021-08-09 NOTE — Progress Notes (Signed)
Manufacturing engineer (ACC)  Per MD at [facility], upon discharge of this patient the Wound Vac will need to be discontinued prior to [facility] arrival. If RN could just place a thick pressure dressing over this area until he is admitted to [facility] and they will take over from there.   Thank you all, Clementeen Hoof, BSN, RN [phone]

## 2021-08-10 MED ORDER — BLISTEX MEDICATED EX OINT
1.0000 | TOPICAL_OINTMENT | CUTANEOUS | Status: DC | PRN
Start: 2021-08-10 — End: 2022-12-15

## 2021-08-10 MED ORDER — HYDROMORPHONE HCL 1 MG/ML IJ SOLN: 1.0000 mg | INTRAMUSCULAR | 0 refills | Status: DC | PRN

## 2021-08-10 MED ORDER — LORAZEPAM 2 MG/ML IJ SOLN: 1.0000 mg | INTRAMUSCULAR | 0 refills | Status: DC | PRN

## 2021-08-10 NOTE — TOC Transition Note (Signed)
Transition of Care [facility]) - CM/SW Discharge Note   Patient Details  Name: Arthur Mcmahon MRN: [id number] Date of Birth: [birth year]  Transition of Care [facility]) CM/SW Contact:  Milinda Antis, [place] Phone Number: 08/10/2021, 12:19 PM   Clinical Narrative:    Patient will DC to: [facility] Anticipated DC date: 08/10/2021 Family notified:  Yes Transport by: Corey Harold   Per MD patient ready for DC to [facility]. RN to call report prior to discharge [phone]). RN, patient, patient's family, and facility notified of DC. Discharge Summary and FL2 sent to facility. DC packet on chart. Ambulance transport requested for patient.   CSW will sign off for now as social work intervention is no longer needed. Please consult [place] again if new needs arise.     Final next level of care: [place] Barriers to Discharge: Continued Medical Work up   Patient Goals and CMS Choice Patient states their goals for this hospitalization and ongoing recovery are:: [facility] CMS Medicare.gov Compare Post Acute Care list provided to:: Patient Choice offered to / list presented to : Patient  Discharge Placement                       Discharge Plan and Services In-house Referral: Clinical Social Work Discharge Planning Services: CM Consult Post Acute Care Choice: Home Health, Resumption of Svcs/PTA Provider          DME Arranged: Vac, Bedside commode DME Agency: Rachelle Hora Date DME Agency Contacted: 07/18/21 Time DME Agency Contacted: 1100 Representative spoke with at DME Agency: Olivia Mackie w/ [employer] HH Arranged: RN, PT [facility] Agency: [place] ([place]) Date [place]: 07/18/21 Time HH Agency Contacted: 1000 Representative spoke with at [place]: [place] ([place]) Interventions     Readmission Risk Interventions Readmission Risk Prevention Plan 07/04/2021 06/22/2021  Post Dischage Appt - Complete  Medication Screening - Complete   Transportation Screening Complete Complete  PCP or Specialist Appt within 5-7 Days Complete -  Home Care Screening Complete -  Medication Review (RN CM) Complete -  Some recent data might be hidden

## 2021-08-10 NOTE — Progress Notes (Signed)
Wound vac removed and discontinued per order. Pressure dressing applied to groin incision.  Raelyn Number, RN

## 2021-08-10 NOTE — Progress Notes (Addendum)
[id number]) Hospital Liaison Note  [facility] is able to offer a room to Mr. Gunnoe today. Consents are being arranged for completion at this time. MSW to notify [facility] when forms have been signed to proceed with transportation arranged.   As stated previously, Per MD at [facility], upon discharge of this patient the Wound Vac will need to be discontinued prior to PTAR arrival. If RN could just place a thick pressure dressing over this area until he is admitted to [facility] and they will take over from there.   Please send signed DNR form with patient and RN call report to [phone].   ADDENDUM  PTAR arranged by MSW. Expected wait time:1-1.5 [place], MSW [facility] Liaison [phone]

## 2021-08-10 NOTE — Discharge Summary (Addendum)
[facility] Discharge Summary  Patient name: Arthur Mcmahon Medical record number: [id number] Date of birth: March 20, 1966 Age: 55 y.o. Gender: male Date of Admission: 07/13/2021  Date of Discharge: 08/10/21 Admitting Physician: Lyndee Hensen, DO  Primary Care Provider: Shary Key, DO Consultants: Vascular surgery, oncology, nephrology, palliative medicine  Indication for Hospitalization: debridement of LE fasciotomy incision and lymphatic leakage  Discharge Diagnoses/Problem List:  Principal Problem:   Lymphatic obstruction Active Problems:   Hyponatremia   AKI (acute kidney injury) ([place])   Bladder cancer ([place])   Encounter for postoperative wound care   Protein-calorie malnutrition, severe   Wound dehiscence, surgical   Lymphadenopathy, retroperitoneal   Lymphadenopathy, periaortic   Lymphadenopathy, left supraclavicular   Pressure ulcer of coccygeal region, stage 2 ([place])   Metastatic urothelial carcinoma ([place])   Venous obstruction    Disposition: [facility]idential hospice [facility])  Discharge Condition: Stable  Discharge Exam:  Blood pressure (P) 117/73, pulse (P) 94, temperature (P) 98.4 F (36.9 C), temperature source (P) Oral, resp. rate (P) 12, height 6\' 2"  (1.88 m), weight 129.7 kg, SpO2 96 %.  Gen: NAD, sleeping comfortably in bed CV: RRR, no murmurs auscultated Respiratory: CTAB, no increased WOB Abdomen: 4+ edema up to chest Extremities: 4+ edema BLEs, wound VAC in place  Brief Hospital Course:  Arthur Mcmahon is a 55 y.o. male presenting with wound VAC placement of groin incision today for ongoing lymphatic leak and debridement of necrotic lower extremity fasciotomy incisions. Past medical history includes metastatic bladder cancer with lymphadenopathy in the groin.   Necrosis of surgical incisions and lymphatic leakage s/p debridement and wound VAC placement  PAD Patient had acutely ischemic right lower extremity 06/18/2021  s/p right common femoral endarterectomy and lower extremity thrombectomy and fasciotomies. Patient had follow up with vascular where he said his incisions were looking worse and he was having drainage from groin incision. He said his right leg was very swollen. Patient had wound VAC placement on the groin incision for the ongoing lymphatic leak and debridement of necrotic lower extremity fasciotomy incisions. Contacted by vascular surgery for admission for this patient for continued medical care.  Patient has remained afebrile in the hospital. Patient wounds continued to heal, with significant wound vac drainage preventing him from going home. Wound Vac in hospital had output that was significantly higher than what the home Wound Vac's could accommodate.  Wound VAC was continued for therapeutic lymphatic drainage for quality of life.  Metastatic bladder cancer  Right hydroureteronephrosis  Hematuria Follows with [facility]. Metastatic stage IVa [id number] urothelial carcinoma of the bladder with right hydroureteronephrosis and widely metastatic lymphadenopathy of retroperitoneal, pelvic, left supraclavicular, left axilla, left para-aortic, retrocrural adenopathy. Had transurethral resection of bladder tumor on 10/31. Says his urine is more of a normal color without clots from previously a cherry red color. Patient had left clavicular lymph node biopsied, results showed metastatic carcinoma, morphologically compatible with urothelial carcinoma.  Ultimately, patient progressed to requiring comfort care measures and was discharged to [facility].  AKI on CKD IIIb  Electrolyte derangement Patient presented with stable Hyponatremia, that worsened over admission. Patient also developed Hyperkalemia, treated with lowkelma, Hypercalcemia, when corrected for albumin, and hyperphosphatemia. Patient developed AKI with Cr rising as high as 2.49 with baseline Cr being between 0.8-0.9. AKI etiology thought to be 2/2  ATN due to intravascular volume depletion.   Anemia  Received 1 u pRBC 11/29.  Significant Procedures: L supraclavicular LN biopsy  Significant  Labs and Imaging:  Recent Labs  Lab 08/05/21 0222 08/07/21 0040 08/08/21 0056  WBC 12.6* 13.8* 16.4*  HGB 9.3* 9.3* 9.3*  HCT 29.2* 30.7* 30.0*  PLT 478* 497* 468*   Recent Labs  Lab 08/05/21 0222 08/06/21 0124 08/06/21 1322 08/07/21 0040 08/07/21 0404 08/07/21 1431 08/08/21 0056  NA 127* 128* 128* 127* 130* 129* 128*  K 5.0 5.2* 5.2* 5.7* 5.6* 5.3* 5.6*  CL 98 97* 97* 97* 98 98 99  CO2 21* 24 23 21* 22 21* 20*  GLUCOSE 83 71 86 65* 76 95 72  BUN 70* 78* 81* 84* 84* 88* 87*  CREATININE 2.60* 3.09* 3.32* 3.41* 3.44* 3.84* 3.83*  CALCIUM 9.8 9.7 9.8 9.6 9.9 9.6 9.5  PHOS 4.6 4.8* 5.3* 5.8*  --   --  5.7*  ALKPHOS  --   --   --   --  77  --  85  AST  --   --   --   --  17  --  18  ALT  --   --   --   --  7  --  8  ALBUMIN 2.1* 2.0* 1.9* 2.0* 1.9*  --  1.8*    Results/Tests Pending at Time of Discharge: None  Discharge Medications:  Allergies as of 08/10/2021   No Known Allergies      Medication List     STOP taking these medications    apixaban 5 MG Tabs tablet Commonly known as: ELIQUIS   multivitamin with minerals Tabs tablet   oxyCODONE-acetaminophen 10-325 MG tablet Commonly known as: Percocet       TAKE these medications    acetaminophen 500 MG tablet Commonly known as: TYLENOL Take 500 mg by mouth every 6 (six) hours as needed for mild pain, fever or headache.   HYDROcodone-acetaminophen 5-325 MG tablet Commonly known as: NORCO/VICODIN Take 2 tablets by mouth every 6 (six) hours as needed for severe pain.   HYDROmorphone 1 MG/ML injection Commonly known as: DILAUDID Inject 1 mL (1 mg total) into the vein every 2 (two) hours as needed for moderate pain (dyspnea).   lip balm Oint Apply 1 application topically as needed for lip care.   LORazepam 2 MG/ML injection Commonly known as:  ATIVAN Inject 0.5 mLs (1 mg total) into the vein every 4 (four) hours as needed for anxiety.   senna-docusate 8.6-50 MG tablet Commonly known as: Senokot-S Take 2 tablets by mouth 2 (two) times daily.        Discharge Instructions: Please refer to Patient Instructions section of EMR for full details.  Patient was counseled important signs and symptoms that should prompt return to medical care, changes in medications, dietary instructions, activity restrictions, and follow up appointments.   Follow-Up Appointments: No future appointments.  Wells Guiles, DO 08/10/2021, 11:00 AM PGY-1, [place]

## 2021-08-10 NOTE — Progress Notes (Signed)
Pt discharged to [facility] via [place]. Discharge paperwork provided to transport. Report called to nurse Helene Kelp.  Raelyn Number, RN

## 2021-09-04 DEATH — deceased

## 2022-07-24 IMAGING — CT CT ANGIO AOBIFEM WO/W CM
2 of 8 series · 5 of 16 positions shown, 6 images · IV contrast (APPLIED)
Comparison: None.

CLINICAL DATA: Right lower extremity, cold right leg

EXAM:
CT ANGIOGRAPHY OF ABDOMINAL AORTA WITH ILIOFEMORAL RUNOFF
TECHNIQUE: Multidetector CT imaging of the abdomen, pelvis and lower
extremities was performed using the standard protocol during bolus
administration of intravenous contrast. Multiplanar CT image
reconstructions and MIPs were obtained to evaluate the vascular
anatomy.
CONTRAST:  125mL OMNIPAQUE IOHEXOL 350 MG/ML SOLN

[Series 5: arterial · axial · arterial · 0.98mm/px · z∈[+219,+465]mm · 2 of 370 slices shown]
[im 124/370  soft-tissue]
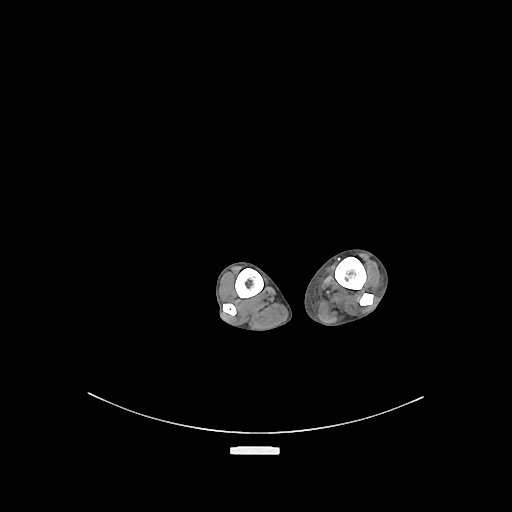
[im 247/370  soft-tissue]
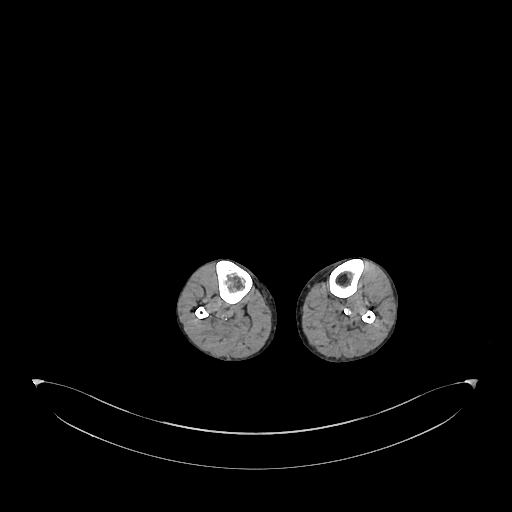

[Series 13: arterial full · axial · arterial · 0.98mm/px · z∈[+340,+1076]mm · 3 of 738 slices shown, 4 images]
[im 185/738  soft-tissue]
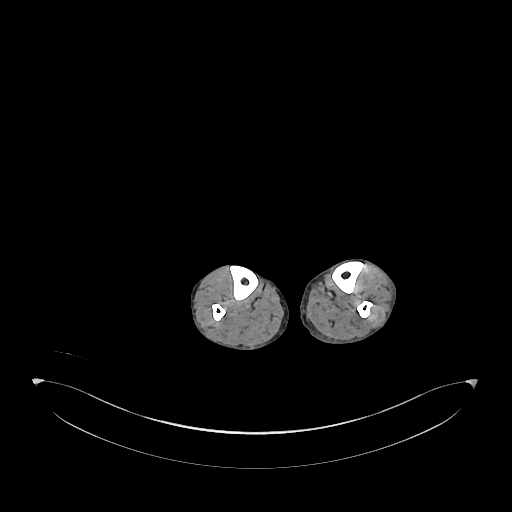
[im 185/738  bone]
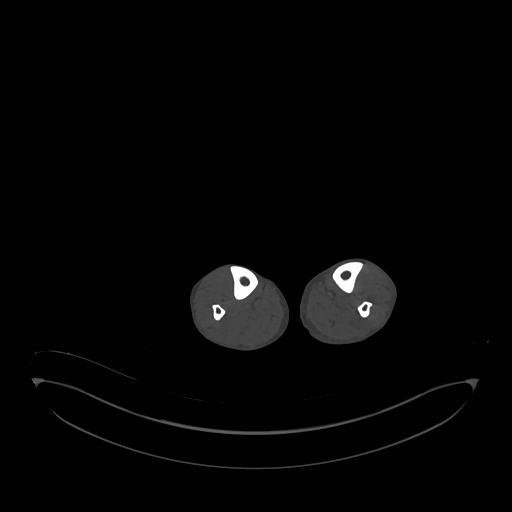
[im 369/738  soft-tissue]
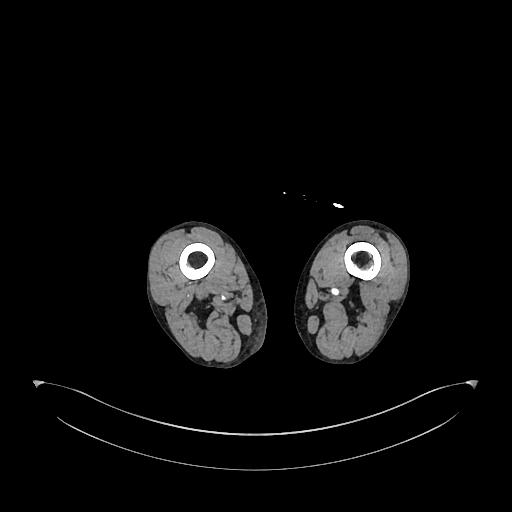
[im 553/738  soft-tissue]
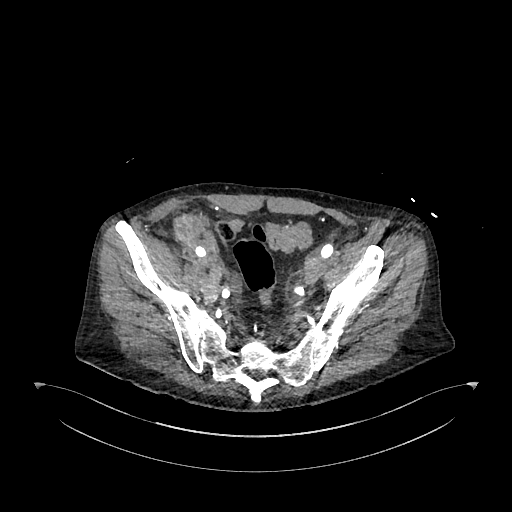

[5 of 16 positions shown; findings below may reference images not displayed]

FINDINGS: VASCULAR

Aorta: Normal contour and caliber of the abdominal aorta. Moderate
mixed abdominal aortic atherosclerosis.

Celiac: Patent without evidence of aneurysm, dissection, vasculitis
or significant stenosis.

SMA: Patent without evidence of aneurysm, dissection, vasculitis or
significant stenosis.

Renals: Patent. Duplicated left renal arteries with a small
accessory inferior pole left renal artery. Solitary right renal
artery, however with a very proximal branching inferior pole
arterial.

IMA: Patent without evidence of aneurysm, dissection, vasculitis or
significant stenosis.

RIGHT Lower Extremity

Inflow: Common, internal and external iliac arteries are patent
without evidence of aneurysm, dissection, vasculitis or significant
stenosis.

Outflow: There is short segment mixed atherosclerotic occlusion of
the right common femoral artery (series 13, image 221). The proximal
superficial femoral and profundal arteries are reconstituted,
however the superficial femoral artery is occluded along its length
from the midportion of the vessel, as is the popliteal artery.

Runoff: No opacification of the right lower extremity runoff
vessels. Scattered atherosclerotic calcifications.

LEFT Lower Extremity

Inflow: Common, internal and external iliac arteries are patent
without evidence of aneurysm, dissection, vasculitis or significant
stenosis.

Outflow: Common, superficial and profunda femoral arteries and the
popliteal artery are patent without evidence of aneurysm,
dissection, vasculitis or significant stenosis. Moderate mixed
calcific atherosclerosis. Patent stent of the left popliteal artery
above the knee.

Runoff: There is one-vessel runoff to the left ankle via the
posterior tibial artery. The anterior tibial and peroneal arteries
are extinguished above the ankle.

Veins: Unremarkable in appearance on this limited arterial phase
examination.

Review of the MIP images confirms the above findings.

NON-VASCULAR

Lower chest: No acute abnormality.

Hepatobiliary: No focal liver abnormality is seen. No gallstones,
gallbladder wall thickening, or biliary dilatation.

Pancreas: Unremarkable. No pancreatic ductal dilatation or
surrounding inflammatory changes.

Spleen: Normal in size without focal abnormality.

Adrenals/Urinary Tract: Adrenal glands are unremarkable. Moderate
right hydronephrosis and hydroureter with a delayed right
nephrogram, consistent with obstruction of the right
ureterovesicular junction by mass. Severe, right eccentric and
posterior wall thickening of the relatively decompressed urinary
bladder, thickening up to 3.1 cm (series 13, image 207).

Stomach/Bowel: Stomach is within normal limits. Appendix appears
normal. No evidence of bowel wall thickening, distention, or
inflammatory changes.

Lymphatic: Numerous bulky retroperitoneal, bilateral iliac, and
pelvic sidewall lymph nodes, largest retroperitoneal nodes or
conglomerates measuring up to 3.8 x 2.6 cm (series 13, image 109),
largest right iliac nodes measuring up to 3.8 x 3.3 cm (series 13,
image 207).

Reproductive: Prostate is unremarkable.

Other: No abdominal wall hernia or abnormality. No abdominopelvic
ascites.

Musculoskeletal: No acute or significant osseous findings.
IMPRESSION: 1. There is short segment mixed atherosclerotic occlusion of the
right common femoral artery.
2. The proximal right superficial femoral and profundal arteries are
reconstituted, however the right superficial femoral artery is
occluded along its length from the midportion of the vessel, as is
the popliteal artery.
3. No opacification of the right lower extremity runoff vessels
secondary to proximal occlusion.
4. Patent left popliteal artery stent.
5. There is one-vessel runoff to the left ankle via the posterior
tibial artery.
6. Severe, right eccentric and posterior wall thickening of the
relatively decompressed urinary bladder, thickening up to 3.1 cm.
7. Numerous bulky retroperitoneal, iliac, and pelvic sidewall lymph
nodes.
8. Findings are consistent with primary bladder malignancy and nodal
metastatic disease.
9. Moderate right hydronephrosis and hydroureter with a delayed
right nephrogram, consistent with obstruction of the right
ureterovesicular junction by mass.

Aortic Atherosclerosis (NCVEY-393.3).

## 2022-08-05 IMAGING — CR DG CHEST 2V
3 series · 3 of 3 positions shown · non-contrast
Comparison: 03/22/2021

CLINICAL DATA: Possible sepsis

EXAM:
CHEST - 2 VIEW

[chest lat (1 of 2)]
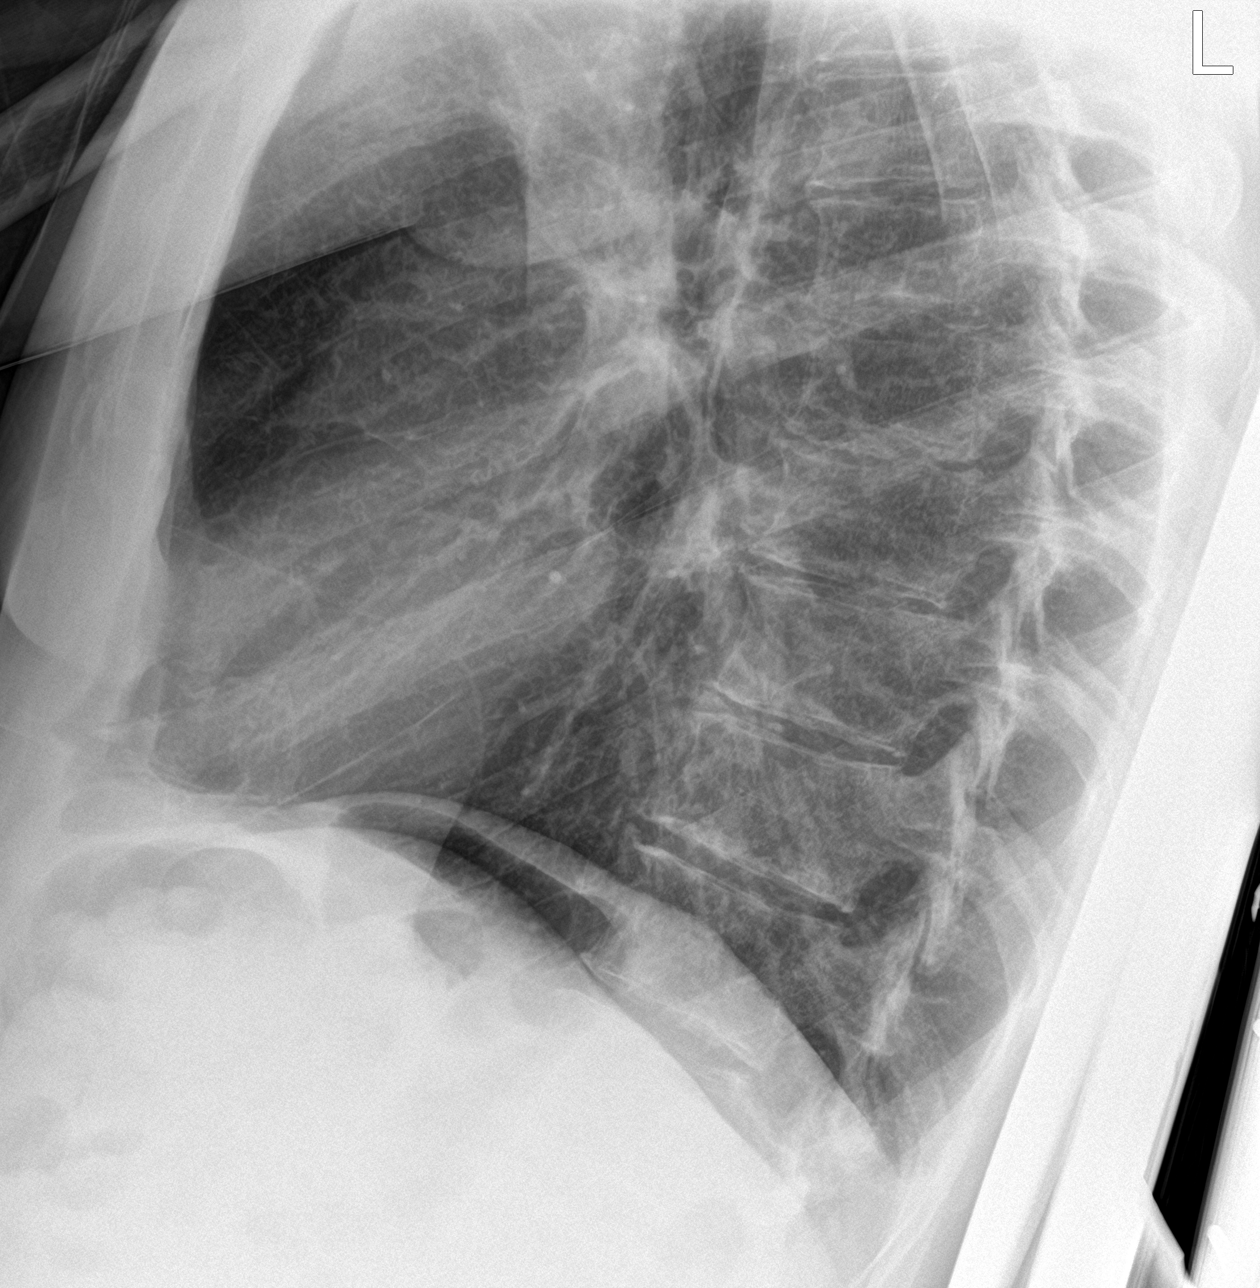

[chest lat (2 of 2)]
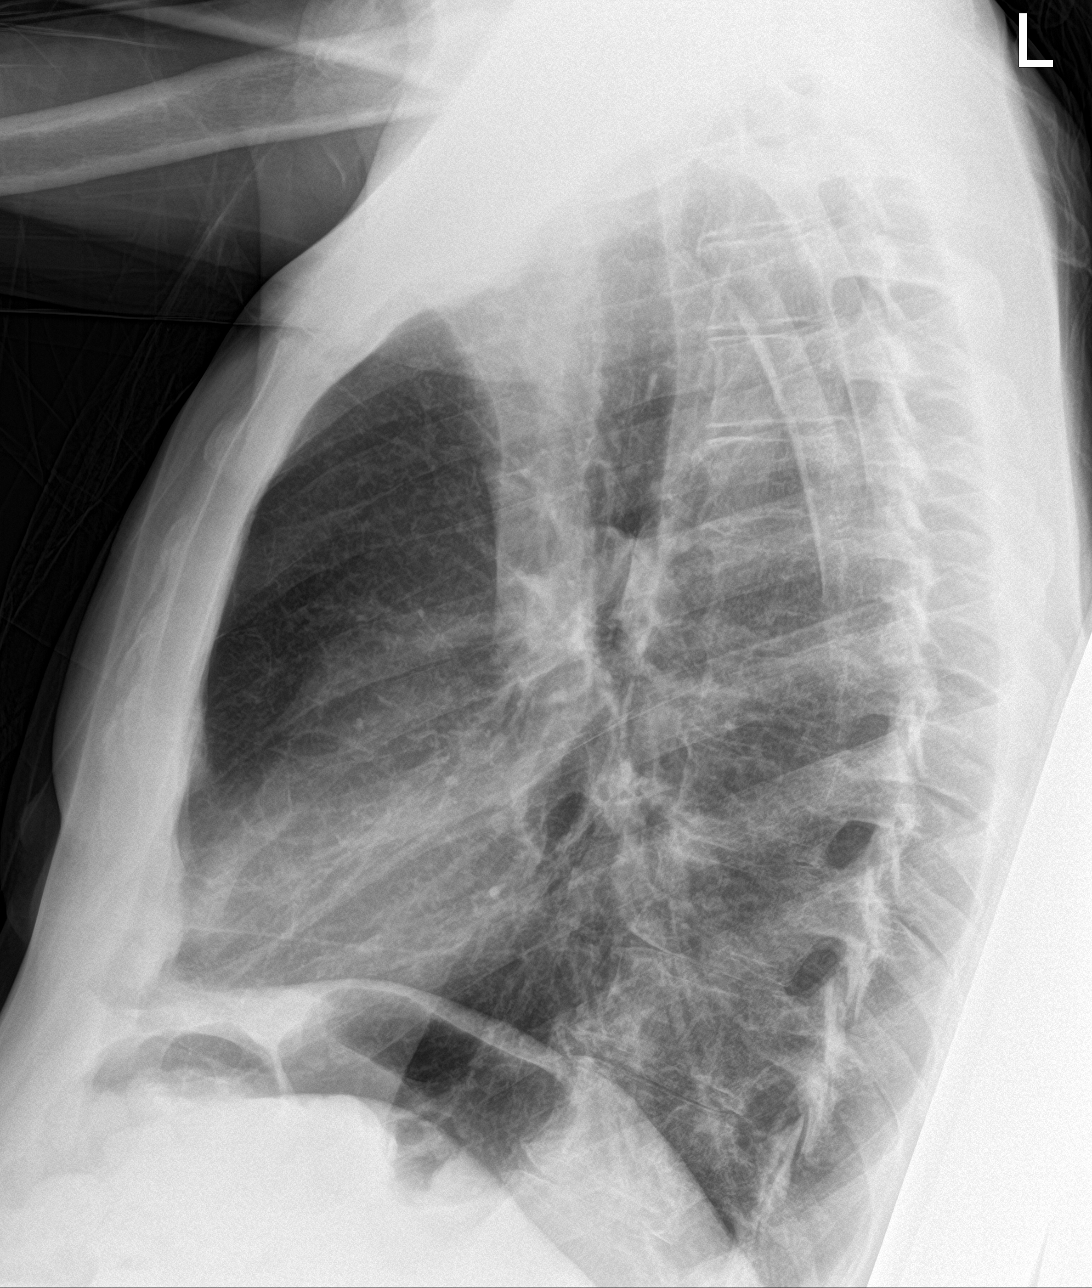

[chest ap]
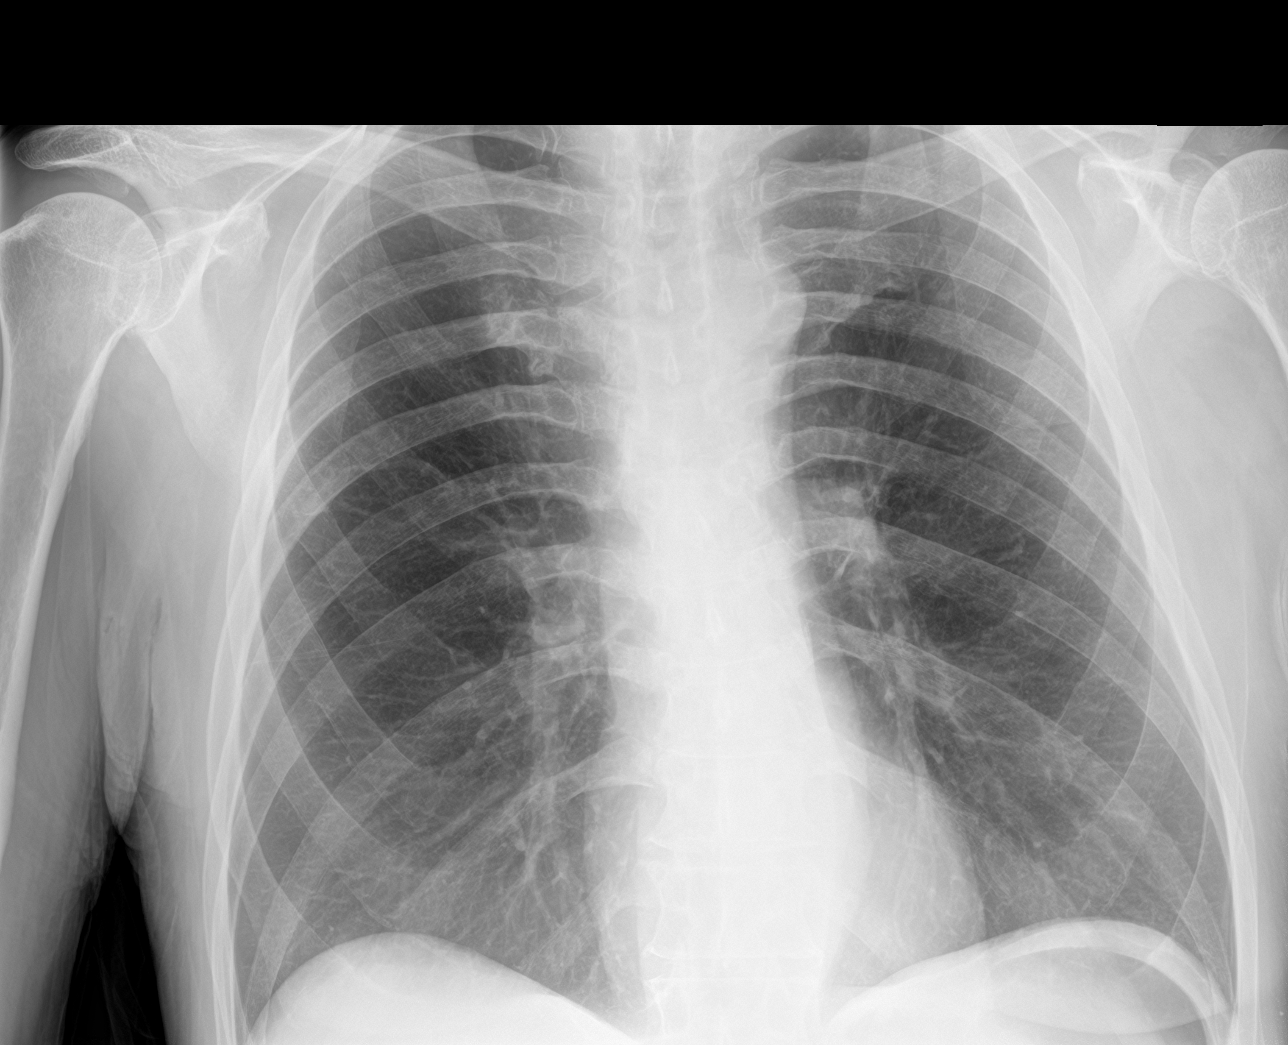

[3 of 3 positions shown; findings below may reference images not displayed]

FINDINGS: Heart and mediastinal contours are within normal limits. No focal
opacities or effusions. No acute bony abnormality.
IMPRESSION: No active cardiopulmonary disease.

## 2022-08-06 IMAGING — CT CT CHEST W/ CM
2 of 4 series · 16 of 46 positions shown, 18 images · IV contrast (APPLIED)
Comparison: 06/18/2021

CLINICAL DATA: Gross hematuria, shortness of breath, urologic
cancer, staging

EXAM:
CT CHEST WITH CONTRAST
TECHNIQUE: Multidetector CT imaging of the chest was performed during
intravenous contrast administration.
CONTRAST:  100mL OMNIPAQUE IOHEXOL 350 MG/ML SOLN

[Series 3: chest w · axial · 0.69mm/px · z∈[+1193,+1479]mm · 13 of 167 slices shown, 15 images]
[im 12/167  soft-tissue]
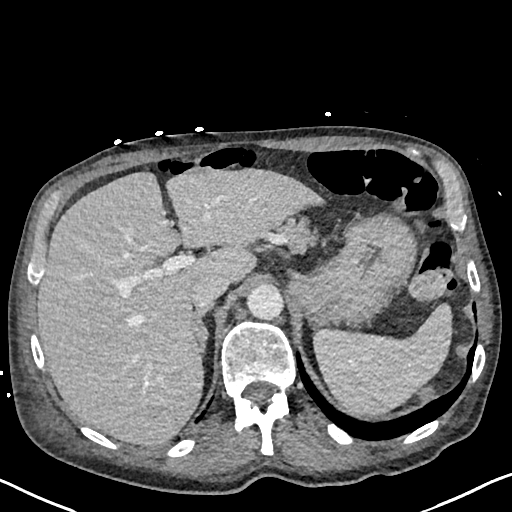
[im 12/167  bone]
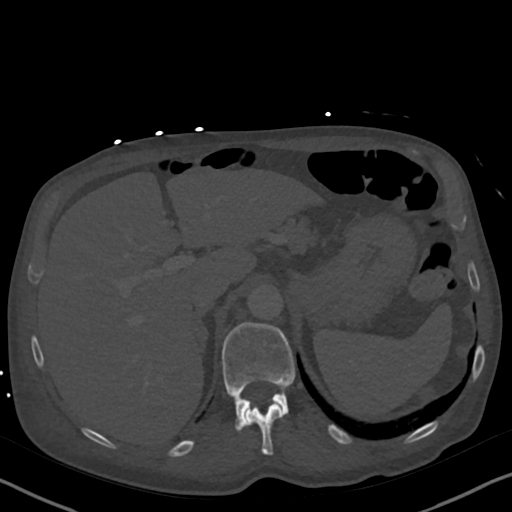
[im 23/167  soft-tissue]
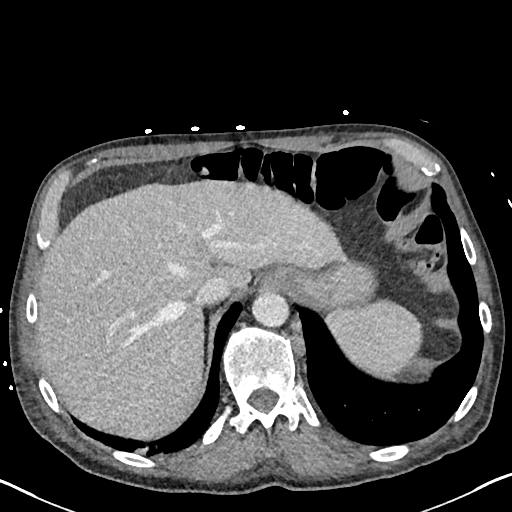
[im 34/167  soft-tissue]
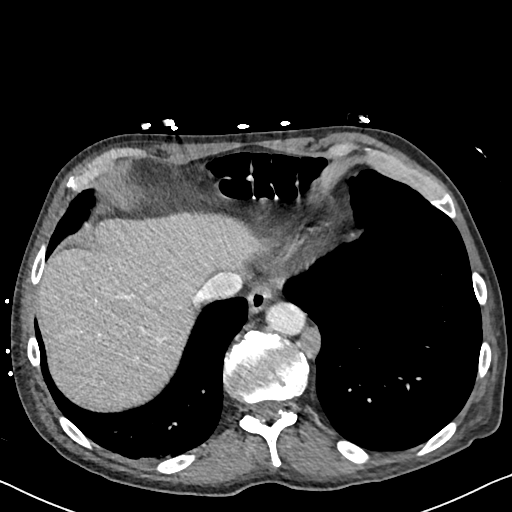
[im 45/167  soft-tissue]
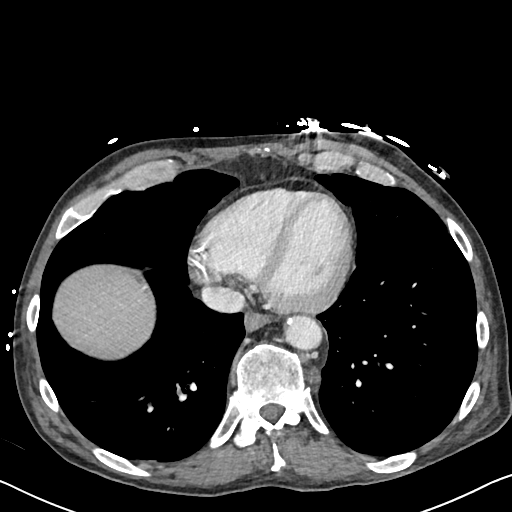
[im 56/167  soft-tissue]
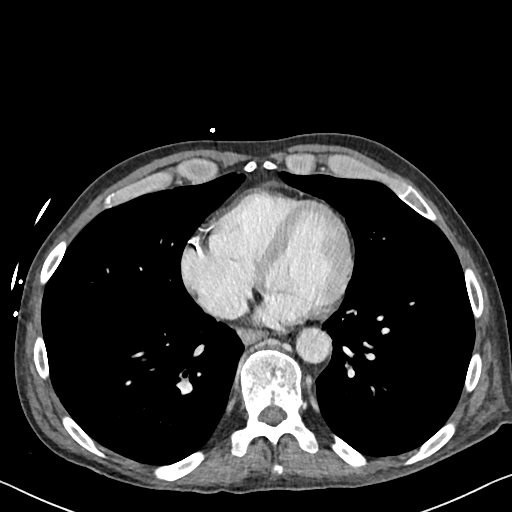
[im 67/167  soft-tissue]
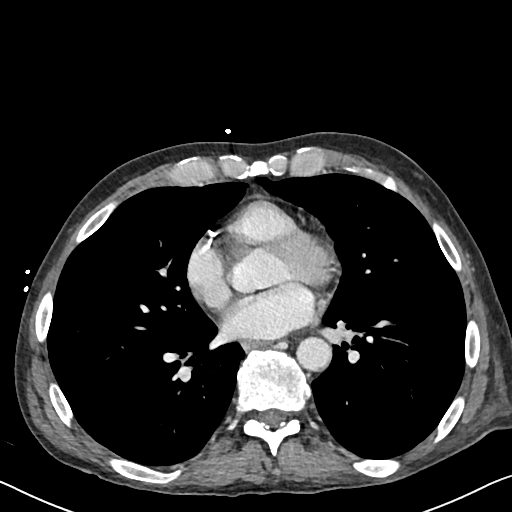
[im 89/167  soft-tissue]
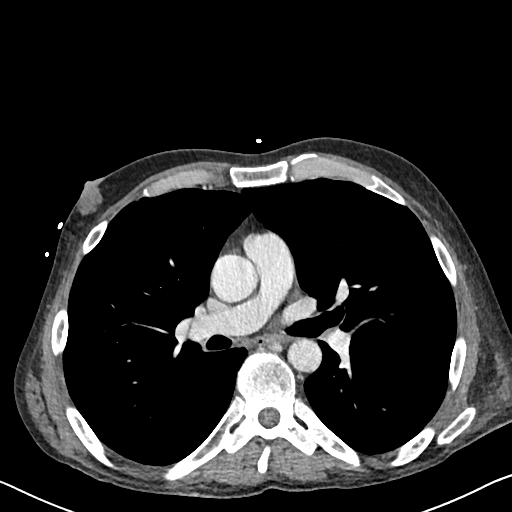
[im 100/167  soft-tissue]
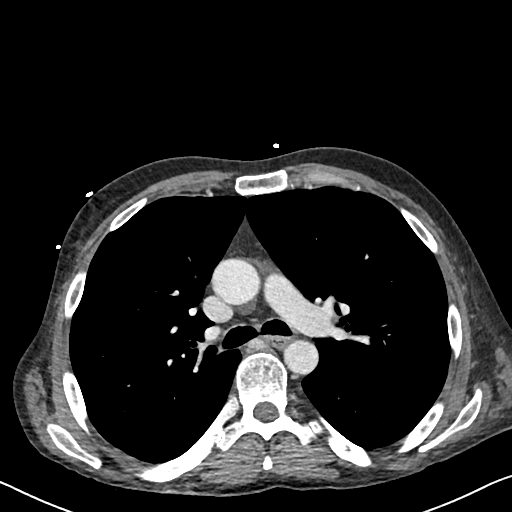
[im 111/167  soft-tissue]
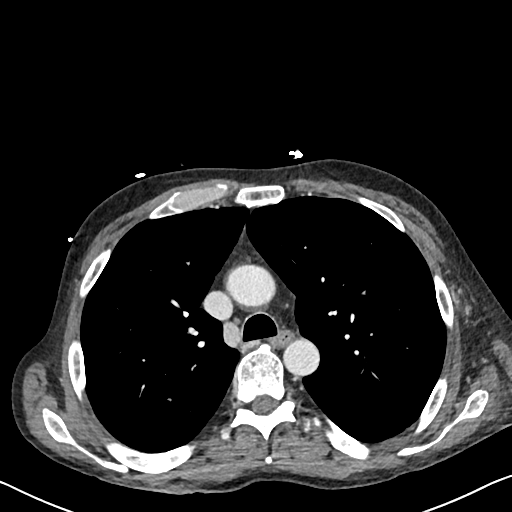
[im 111/167  bone]
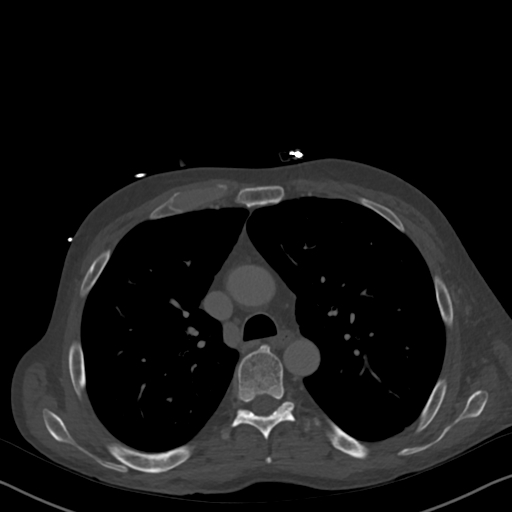
[im 122/167  soft-tissue]
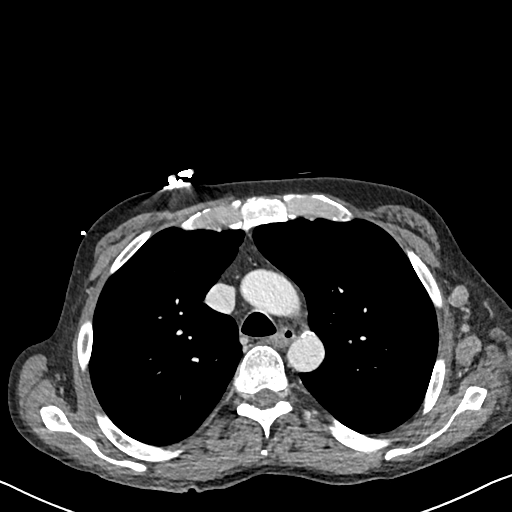
[im 133/167  soft-tissue]
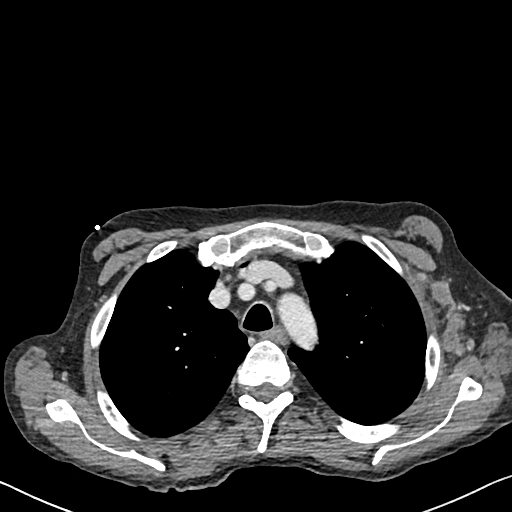
[im 144/167  soft-tissue]
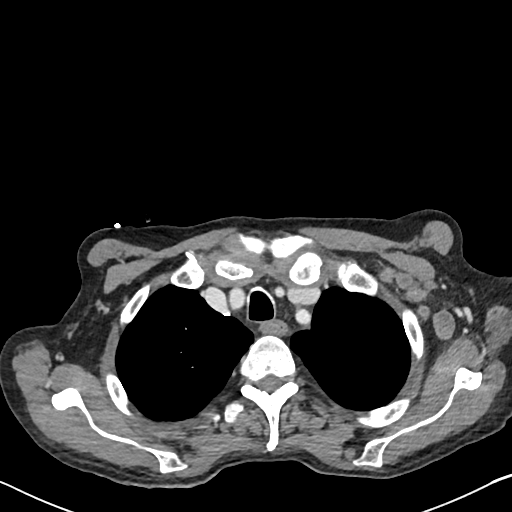
[im 155/167  soft-tissue]
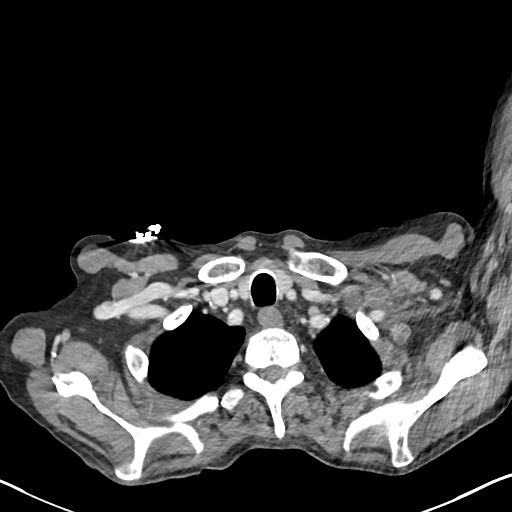

[Series 6: cor · coronal · 0.67mm/px · 3 of 134 slices shown]
[im 45/134  soft-tissue]
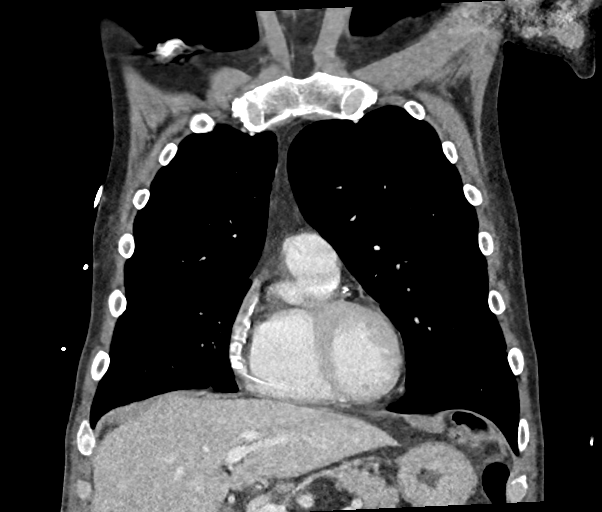
[im 60/134  soft-tissue]
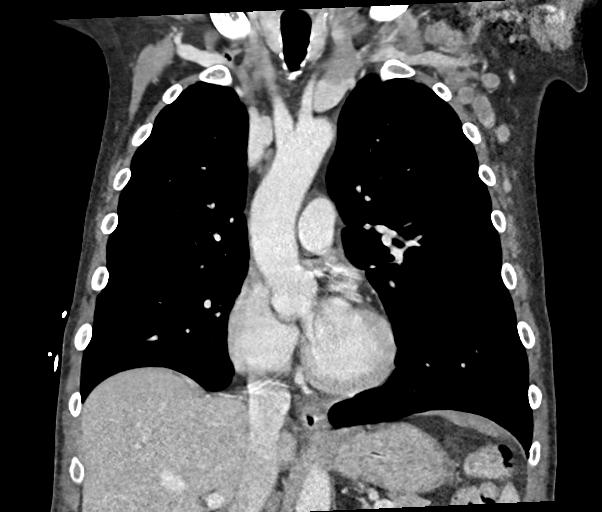
[im 74/134  soft-tissue]
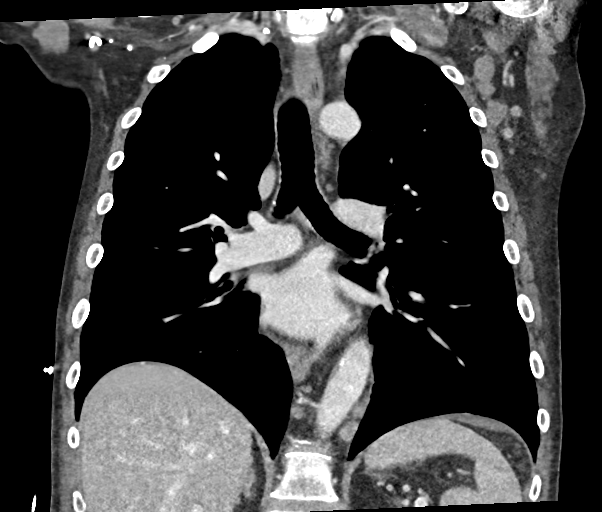

[16 of 46 positions shown; findings below may reference images not displayed]

FINDINGS: Cardiovascular: The heart and great vessels are unremarkable without
pericardial effusion. Extensive atherosclerosis of the coronary
vasculature. No evidence of thoracic aortic aneurysm or dissection.
While not optimized for opacification of the pulmonary vasculature,
there is sufficient contrast enhancement to exclude pulmonary
emboli. No filling defects.

Mediastinum/Nodes: Extensive pathologic adenopathy is seen within
the left supraclavicular region. Largest lymph node mass on image
[DATE] measures 1.7 x 3.5 cm. Additionally, there are numerous
pathologic lymph nodes within the left axilla, largest measuring up
to 1.5 cm in short axis reference image [DATE]. Pathologic adenopathy
is seen within the left para-aortic region, largest lymph node
measuring 1.3 cm in short axis reference image 135/3.

No pathologic adenopathy within the mediastinum, hila, or right
axilla.

The thyroid, trachea, and esophagus are unremarkable.

Lungs/Pleura: No acute airspace disease, effusion, or pneumothorax.
Mild right lower lobe bronchial wall thickening and mucous plugging.
Remaining central airways are patent. No pulmonary nodules or
masses.

Upper Abdomen: Enlarged right retrocrural lymph node measures 1.1 cm
in short axis, reference image 154/3. Subcentimeter retrocaval lymph
nodes are also noted. Findings are consistent with metastatic
disease, please refer to CT abdomen report.

Musculoskeletal: There are no acute or destructive bony lesions.
Reconstructed images demonstrate no additional findings.
IMPRESSION: 1. Pathologic adenopathy within the left supraclavicular region,
left axilla, and left para-aortic region, consistent with metastatic
disease given patient's known history of urologic malignancy.
2. Borderline enlarged lymph nodes within the retrocrural region and
retroperitoneum, consistent with metastatic disease. Please refer to
CT abdomen report for full description.
3. Minimal right lower lobe bronchial wall thickening and mucous
plugging, which may reflect reactive airway disease. No acute
airspace disease.
4.  Aortic Atherosclerosis ([id number]).
